# Patient Record
Sex: Female | Born: 1950 | Race: White | Hispanic: No | Marital: Married | Smoking: Never smoker
Health system: Southern US, Community
[De-identification: ages and names within clinical notes are randomized; demographics above are authoritative.]

## PROBLEM LIST (undated history)

## (undated) DIAGNOSIS — I8390 Asymptomatic varicose veins of unspecified lower extremity: Secondary | ICD-10-CM

## (undated) DIAGNOSIS — Q059 Spina bifida, unspecified: Secondary | ICD-10-CM

## (undated) DIAGNOSIS — I639 Cerebral infarction, unspecified: Secondary | ICD-10-CM

## (undated) DIAGNOSIS — E063 Autoimmune thyroiditis: Secondary | ICD-10-CM

## (undated) DIAGNOSIS — K219 Gastro-esophageal reflux disease without esophagitis: Secondary | ICD-10-CM

## (undated) DIAGNOSIS — F419 Anxiety disorder, unspecified: Secondary | ICD-10-CM

## (undated) DIAGNOSIS — M47819 Spondylosis without myelopathy or radiculopathy, site unspecified: Secondary | ICD-10-CM

## (undated) DIAGNOSIS — C50919 Malignant neoplasm of unspecified site of unspecified female breast: Secondary | ICD-10-CM

## (undated) DIAGNOSIS — R51 Headache: Secondary | ICD-10-CM

## (undated) DIAGNOSIS — E7800 Pure hypercholesterolemia, unspecified: Secondary | ICD-10-CM

## (undated) HISTORY — PX: CYSTOSCOPY WITH URETHRAL CARUNCLE: SHX5124

## (undated) HISTORY — DX: Malignant neoplasm of unspecified site of unspecified female breast: C50.919

## (undated) HISTORY — DX: Asymptomatic varicose veins of unspecified lower extremity: I83.90

## (undated) HISTORY — DX: Autoimmune thyroiditis: E06.3

## (undated) HISTORY — PX: TUBAL LIGATION: SHX77

## (undated) HISTORY — DX: Anxiety disorder, unspecified: F41.9

## (undated) HISTORY — DX: Spondylosis without myelopathy or radiculopathy, site unspecified: M47.819

## (undated) HISTORY — DX: Spina bifida, unspecified: Q05.9

## (undated) HISTORY — DX: Cerebral infarction, unspecified: I63.9

## (undated) HISTORY — DX: Pure hypercholesterolemia, unspecified: E78.00

---

## 1974-11-24 HISTORY — PX: HERNIA REPAIR: SHX51

## 1988-11-24 HISTORY — PX: WISDOM TOOTH EXTRACTION: SHX21

## 1998-04-02 ENCOUNTER — Other Ambulatory Visit: Admission: RE | Admit: 1998-04-02 | Discharge: 1998-04-02 | Payer: Self-pay | Admitting: Obstetrics and Gynecology

## 2000-03-16 ENCOUNTER — Other Ambulatory Visit: Admission: RE | Admit: 2000-03-16 | Discharge: 2000-03-16 | Payer: Self-pay | Admitting: Obstetrics and Gynecology

## 2001-02-18 ENCOUNTER — Encounter: Payer: Self-pay | Admitting: Internal Medicine

## 2001-02-18 ENCOUNTER — Encounter: Admission: RE | Admit: 2001-02-18 | Discharge: 2001-02-18 | Payer: Self-pay | Admitting: Internal Medicine

## 2001-03-18 ENCOUNTER — Other Ambulatory Visit: Admission: RE | Admit: 2001-03-18 | Discharge: 2001-03-18 | Payer: Self-pay | Admitting: Obstetrics and Gynecology

## 2002-02-25 ENCOUNTER — Encounter (INDEPENDENT_AMBULATORY_CARE_PROVIDER_SITE_OTHER): Payer: Self-pay | Admitting: Specialist

## 2002-02-25 ENCOUNTER — Ambulatory Visit (HOSPITAL_COMMUNITY): Admission: RE | Admit: 2002-02-25 | Discharge: 2002-02-25 | Payer: Self-pay | Admitting: Gastroenterology

## 2002-03-03 ENCOUNTER — Other Ambulatory Visit: Admission: RE | Admit: 2002-03-03 | Discharge: 2002-03-03 | Payer: Self-pay | Admitting: Obstetrics and Gynecology

## 2003-03-13 ENCOUNTER — Other Ambulatory Visit: Admission: RE | Admit: 2003-03-13 | Discharge: 2003-03-13 | Payer: Self-pay | Admitting: Obstetrics and Gynecology

## 2004-03-12 ENCOUNTER — Other Ambulatory Visit: Admission: RE | Admit: 2004-03-12 | Discharge: 2004-03-12 | Payer: Self-pay | Admitting: Obstetrics and Gynecology

## 2005-03-17 ENCOUNTER — Other Ambulatory Visit: Admission: RE | Admit: 2005-03-17 | Discharge: 2005-03-17 | Payer: Self-pay | Admitting: *Deleted

## 2006-03-18 ENCOUNTER — Other Ambulatory Visit: Admission: RE | Admit: 2006-03-18 | Discharge: 2006-03-18 | Payer: Self-pay | Admitting: Obstetrics & Gynecology

## 2007-07-02 ENCOUNTER — Other Ambulatory Visit: Admission: RE | Admit: 2007-07-02 | Discharge: 2007-07-02 | Payer: Self-pay | Admitting: Obstetrics & Gynecology

## 2007-07-25 ENCOUNTER — Encounter: Admission: RE | Admit: 2007-07-25 | Discharge: 2007-07-25 | Payer: Self-pay | Admitting: Obstetrics & Gynecology

## 2008-09-21 ENCOUNTER — Other Ambulatory Visit: Admission: RE | Admit: 2008-09-21 | Discharge: 2008-09-21 | Payer: Self-pay | Admitting: Obstetrics & Gynecology

## 2008-10-13 ENCOUNTER — Encounter: Admission: RE | Admit: 2008-10-13 | Discharge: 2008-10-13 | Payer: Self-pay | Admitting: Obstetrics & Gynecology

## 2009-08-05 ENCOUNTER — Encounter: Admission: RE | Admit: 2009-08-05 | Discharge: 2009-08-05 | Payer: Self-pay | Admitting: Family Medicine

## 2009-08-30 ENCOUNTER — Encounter: Admission: RE | Admit: 2009-08-30 | Discharge: 2009-08-30 | Payer: Self-pay | Admitting: Family Medicine

## 2009-09-09 ENCOUNTER — Emergency Department (HOSPITAL_COMMUNITY): Admission: EM | Admit: 2009-09-09 | Discharge: 2009-09-09 | Payer: Self-pay | Admitting: Family Medicine

## 2010-12-15 ENCOUNTER — Encounter: Payer: Self-pay | Admitting: Family Medicine

## 2011-04-11 NOTE — Procedures (Signed)
[facility]  Patient:    Barbara [name], Barbara [name] Visit Number: [id number] MRN: [id number]          Service Type: END Location: ENDO Attending Physician:  Nelda Marseille Dictated by:   Petra Kuba, M.D. Proc. Date: 02/25/02 Admit Date:  02/25/2002   CC:         Julieanne Manson, M.D.   Procedure Report  PROCEDURE:  Colonoscopy with polypectomy.  INDICATION:  Patient with polyp on flexible sigmoidoscopy.  Consent was signed after risks, benefits, methods, and options thoroughly discussed in the office.  MEDICATIONS:  Demerol 50, Versed 5.  DESCRIPTION OF PROCEDURE:  Rectal inspection was pertinent for external hemorrhoids.  The pediatric video adjustable colonoscope was inserted and easily advanced around the colon to the cecum.  This did require rolling her on her back and some abdominal pressure.  On insertion, in the mid sigmoid, the polyp seen on flexible sigmoidoscopy was seen.  Photodocumentation was obtained.  Based on its size and location, we thought we could easily find it on withdrawal.  On insertion, however, in the proximal level of the splenic flexure, a small polyp was seen and was hot biopsied x 1 and put in the first container.  No other abnormalities were seen as we advanced to the cecum which was identified by the appendiceal orifice and the ileocecal valve.  In fact, the scope was inserted a short ways in the terminal ileum which was normal. Photodocumentation was obtained.  The scope was slowly withdrawn.  The prep was adequate.  There was some liquid stool that required washing and suctioning but, on slow withdrawal through the colon, the cecum and ascending were normal.  In the mid transverse, another tiny polyp was seen and was hot biopsied x 1 and put in the first container.  The scope was withdrawn back to the other polypectomy site.  No obvious polypoid tissue was remaining, and there was a nice white coagulum.   The scope was further withdrawn to the mid sigmoid, where the polyp seen on insertion was seen, snared, electrocautery applied, and the polyp was suctioned through the scope and collected in the trap.  Possibly along the cautery line was some residual polyp which was easily cold biopsy x 2 and put in the second container with the polyp that was just removed.  The scope was further withdrawn.  No additional findings were seen as we withdrew back to the rectum.  Once back in the rectum, the scope was retroflexed, pertinent for some internal hemorrhoids.  The scope was straightened and readvanced a short ways around the left side of the colon to the polypectomy site.  No obvious polypoid tissue was remaining; air was suctioned and the scope removed.  The patient tolerated the procedure well. There was no obvious immediate complications.  ENDOSCOPIC DIAGNOSES: 1. Internal and external hemorrhoids. 2. A 5 mm sigmoid polyp, status post snare and cold biopsied of the edge. 3. Two tiny descending and transverse polyps, hot biopsied. 4. Otherwise within normal limits to the terminal ileum.  PLAN: 1. Await pathology, but probably recheck colon screening in 3-5 years. 2. Happy to see back p.r.n., otherwise return care to Dr. Delrae Alfred for the    customary health care maintenance to include yearly rectals and guaiacs. 3. Will omit aspirin and nonsteroidals for one week. Dictated by:   Petra Kuba, M.D. Attending Physician:  Nelda Marseille DD:  02/25/02 TD:  02/26/02 Job: [id number]

## 2013-04-11 ENCOUNTER — Other Ambulatory Visit: Payer: Self-pay | Admitting: *Deleted

## 2013-04-11 NOTE — Telephone Encounter (Signed)
Faxed refill request received from pharmacy for VITAMIN D  Last filled by MD on 02/06/12, #26 X 3 Last AEX - 01/10/13 Last Vitamin D level - 09/26/09 = 44 Next AEX - 03/23/14 Please advise refills.  Paper chart on your desk.

## 2013-04-14 ENCOUNTER — Telehealth: Payer: Self-pay | Admitting: *Deleted

## 2013-04-14 NOTE — Telephone Encounter (Signed)
Call taken care of in 04/14/13 telephone note.

## 2013-04-14 NOTE — Telephone Encounter (Signed)
Pt. Had Vit D drawn with Dr. Maurice Small 04/08/13. Pt didn't remember the result will have them fax over result, told pt we would call her back with further instructions. Also notified her that Vit D Protocol has changed.

## 2013-04-14 NOTE — Telephone Encounter (Signed)
Left Message To Call Back Re: Per Dr. Farrel Gobble pt needs to f/u with Dr. Jone Baseman office since they drew her Vit D.

## 2013-04-14 NOTE — Telephone Encounter (Signed)
S/w pt Per Dr. Farrel Gobble pt needs to have her PCP f/u with her Vit D rx. Notified pt of this and she is aware.

## 2013-04-14 NOTE — Telephone Encounter (Signed)
Left Message To Call Back Re: Calling in regards to Vit D 50,000 iu request from [facility] pharmacy; Dr. Farrel Gobble wants to know if pt has gotten VIT D drawn somewhere else, last drawn here 12/21/08 it was 38. If not pt needs to come in and have Vit D drawn per Dr. Farrel Gobble.

## 2013-08-17 ENCOUNTER — Telehealth: Payer: Self-pay | Admitting: Obstetrics & Gynecology

## 2013-08-17 DIAGNOSIS — Z803 Family history of malignant neoplasm of breast: Secondary | ICD-10-CM

## 2013-08-17 NOTE — Telephone Encounter (Signed)
MRI of breasts recommended by Dr. Hyacinth Meeker per patient. Patient wants to know if she needs an order please?

## 2013-08-17 NOTE — Telephone Encounter (Signed)
Spoke with patient. Last breast MRI 2012, was wondering if she should continue with MRI's and at what frequency she should be getting MRI's. Advised patient I would send Dr. Hyacinth Meeker message to confirm.   Patient with strong family hx of Breast CA. Last annual 01/10/13 with Dr. Farrel Gobble.  Last Mammogram 07/28/13 at [place]. Chart to Dr. Rondel Baton office.

## 2013-08-24 NOTE — Telephone Encounter (Signed)
Breast MRI ordered bilateral with and without contrast.   Calling patient to advise order was placed and [facility] should contact patient to schedule appointment.  Message left to return call to [place] at [phone].

## 2013-08-24 NOTE — Telephone Encounter (Signed)
She has been doing every other year MRIs.  Usually we schedule and then pre-cert with insurance.  Can you start that process please?

## 2013-08-25 NOTE — Telephone Encounter (Signed)
Patient calling back. Advised order was placed and they will call to schedule. Advised to let us know if scheduler does not call her. Patient agreeable.

## 2013-08-29 NOTE — Telephone Encounter (Signed)
LMTCB for sceduler at [facility] [place] at [phone] to assist with scheduling.

## 2013-08-29 NOTE — Telephone Encounter (Signed)
Spoke with scheduler, they will call patient at home to schedule MRI.

## 2013-08-31 ENCOUNTER — Telehealth: Payer: Self-pay | Admitting: Obstetrics & Gynecology

## 2013-08-31 NOTE — Telephone Encounter (Signed)
LMTCB need ins info for prior auth for MRI.

## 2013-09-01 NOTE — Telephone Encounter (Signed)
Barbara [name], Barbara [name] MRN: [id number]  Date: 09/08/2013 Status: Sch  Time: 1:00 PM  Length: 45   Visit Type: MR BREAST W/O & W/CM [150610]  Copay: $0.00  Provider: [facility] Mr 1 Department: [facility] MRI  Referring Provider: Annamaria Boots CSN: [id number]  Notes: PREV MAMMO 07-28-13 AT [facility]-REQUESTED 150 LBS/LABS AT 315 SITE INS [employer]   Made On: Change Notes:  08/29/2013 3:19 PM 08/29/2013 3:20 PM  By: By:  Darcey Nora, KRISTY    Appointment Instructions  Visit Type: MR BREAST W/O & W/CM   Patient Instructions: MR BREAST W/O & W/CM - Patient to arrive 15 minutes prior to appointment time.       Appointment Orders  Order # Category Procedure Appointment Association  [id number]  Img Quincy Simmonds  MR BREAST BILATERAL W WO CONTRAST [[id number]]  Linked         Patient has appointment scheduled for MRI.

## 2013-09-08 ENCOUNTER — Ambulatory Visit
Admission: RE | Admit: 2013-09-08 | Discharge: 2013-09-08 | Disposition: A | Discharge status: Home / Self Care | Payer: BC Managed Care – PPO | Source: Ambulatory Visit | Attending: Obstetrics & Gynecology | Admitting: Obstetrics & Gynecology

## 2013-09-08 DIAGNOSIS — Z803 Family history of malignant neoplasm of breast: Secondary | ICD-10-CM

## 2013-09-08 MED ORDER — GADOBENATE DIMEGLUMINE 529 MG/ML IV SOLN
14.0000 mL | Freq: Once | INTRAVENOUS | Status: AC | PRN
  Administered 2013-09-08: 14 mL via INTRAVENOUS

## 2013-09-21 ENCOUNTER — Other Ambulatory Visit: Payer: Self-pay | Admitting: Radiology

## 2013-09-26 ENCOUNTER — Telehealth: Payer: Self-pay | Admitting: *Deleted

## 2013-09-26 DIAGNOSIS — C50411 Malignant neoplasm of upper-outer quadrant of right female breast: Secondary | ICD-10-CM

## 2013-09-26 NOTE — Telephone Encounter (Signed)
Confirmed [facility] for 09/28/13 at 0830.  Instructions and contact information given.

## 2013-09-27 ENCOUNTER — Telehealth: Payer: Self-pay | Admitting: Obstetrics & Gynecology

## 2013-09-27 NOTE — Telephone Encounter (Signed)
Received pathology report about invasive breast cancer.  Pt has appt 09/28/13.  Offered support.  Questions answered.  Pt very appreciative.

## 2013-09-28 ENCOUNTER — Ambulatory Visit (HOSPITAL_BASED_OUTPATIENT_CLINIC_OR_DEPARTMENT_OTHER): Payer: BC Managed Care – PPO | Admitting: Oncology

## 2013-09-28 ENCOUNTER — Other Ambulatory Visit (HOSPITAL_BASED_OUTPATIENT_CLINIC_OR_DEPARTMENT_OTHER): Payer: BC Managed Care – PPO | Admitting: Lab

## 2013-09-28 ENCOUNTER — Ambulatory Visit: Payer: BC Managed Care – PPO

## 2013-09-28 ENCOUNTER — Ambulatory Visit
Admission: RE | Admit: 2013-09-28 | Discharge: 2013-09-28 | Disposition: A | Discharge status: Home / Self Care | Payer: BC Managed Care – PPO | Source: Ambulatory Visit | Attending: Radiation Oncology | Admitting: Radiation Oncology

## 2013-09-28 ENCOUNTER — Encounter: Payer: Self-pay | Admitting: *Deleted

## 2013-09-28 ENCOUNTER — Ambulatory Visit (HOSPITAL_BASED_OUTPATIENT_CLINIC_OR_DEPARTMENT_OTHER): Payer: BC Managed Care – PPO | Admitting: General Surgery

## 2013-09-28 ENCOUNTER — Encounter: Payer: Self-pay | Admitting: Oncology

## 2013-09-28 ENCOUNTER — Ambulatory Visit: Payer: BC Managed Care – PPO | Attending: General Surgery | Admitting: Physical Therapy

## 2013-09-28 ENCOUNTER — Encounter (INDEPENDENT_AMBULATORY_CARE_PROVIDER_SITE_OTHER): Payer: Self-pay | Admitting: General Surgery

## 2013-09-28 VITALS — BP 142/75 | HR 81 | Temp 98.4°F | Resp 18 | Ht 66.5 in | Wt 153.3 lb

## 2013-09-28 DIAGNOSIS — C50411 Malignant neoplasm of upper-outer quadrant of right female breast: Secondary | ICD-10-CM

## 2013-09-28 DIAGNOSIS — C50919 Malignant neoplasm of unspecified site of unspecified female breast: Secondary | ICD-10-CM | POA: Insufficient documentation

## 2013-09-28 DIAGNOSIS — C50419 Malignant neoplasm of upper-outer quadrant of unspecified female breast: Secondary | ICD-10-CM

## 2013-09-28 DIAGNOSIS — IMO0001 Reserved for inherently not codable concepts without codable children: Secondary | ICD-10-CM | POA: Insufficient documentation

## 2013-09-28 DIAGNOSIS — Z170 Estrogen receptor positive status [ER+]: Secondary | ICD-10-CM

## 2013-09-28 LAB — COMPREHENSIVE METABOLIC PANEL (CC13)
AST: 16 U/L (ref 5–34)
Anion Gap: 9 mEq/L (ref 3–11)
BUN: 23.2 mg/dL (ref 7.0–26.0)
CO2: 25 mEq/L (ref 22–29)
Creatinine: 0.9 mg/dL (ref 0.6–1.1)
Glucose: 85 mg/dl (ref 70–140)
Total Bilirubin: 0.45 mg/dL (ref 0.20–1.20)
Total Protein: 7.4 g/dL (ref 6.4–8.3)

## 2013-09-28 LAB — CBC WITH DIFFERENTIAL/PLATELET
BASO%: 1.1 % (ref 0.0–2.0)
Basophils Absolute: 0.1 10*3/uL (ref 0.0–0.1)
EOS%: 3.4 % (ref 0.0–7.0)
Eosinophils Absolute: 0.2 10*3/uL (ref 0.0–0.5)
HCT: 40.2 % (ref 34.8–46.6)
HGB: 13.6 g/dL (ref 11.6–15.9)
LYMPH%: 33.8 % (ref 14.0–49.7)
MCH: 30.5 pg (ref 25.1–34.0)
MCHC: 33.7 g/dL (ref 31.5–36.0)
MCV: 90.3 fL (ref 79.5–101.0)
MONO#: 0.4 10*3/uL (ref 0.1–0.9)
MONO%: 7.2 % (ref 0.0–14.0)
NEUT#: 3.2 10*3/uL (ref 1.5–6.5)
NEUT%: 54.5 % (ref 38.4–76.8)
Platelets: 245 10*3/uL (ref 145–400)
RBC: 4.45 10*6/uL (ref 3.70–5.45)
RDW: 12.9 % (ref 11.2–14.5)
WBC: 5.9 10*3/uL (ref 3.9–10.3)
lymph#: 2 10*3/uL (ref 0.9–3.3)

## 2013-09-28 NOTE — Progress Notes (Signed)
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 female who is seen today at the [facility] for the courtesy Dr. Carolynne Edouard for consideration of radiation therapy in the management of her T1 N0 invasive ductal/DCIS of the right breast. Because of her strong family history, she had a breast MR on 09/08/2013 which showed a new suspicious mass in the deep central upper outer portion of the right breast. There was no adenopathy. Breast ultrasound at [facility] on October 29 showed a 1.1 cm nodule within the right breast at 10:00. Ultrasound guided biopsy on 09/21/2013 was diagnostic for grade II invasive ductal/DCIS of the right breast. This was ER and PR positive and HER-2/neu positive with a proliferation index of 25%. She is considering bilateral mastectomies. She is seen today with Dr. Carolynne Edouard and Dr. Darnelle Catalan.  PREVIOUS RADIATION THERAPY: No   PAST MEDICAL HISTORY:  has a past medical history of Breast cancer; Hashimoto's thyroiditis; Hypercholesteremia; Spondylarthritis; Spina bifida; and Anxiety.     PAST SURGICAL HISTORY:  Past Surgical History  Procedure Laterality Date  . Hernia repair    . Tubal ligation    . Cystoscopy with urethral caruncle       FAMILY HISTORY: family history includes Breast cancer in her maternal aunt, mother, sister, sister, and sister; Lung cancer in her father; Prostate cancer in her brother. Her father died from a heart attack at age 57, he also had a history of lung cancer and COPD. Her mother died from hemorrhage while on Coumadin for a prosthetic mitral valve at age 88. Her mother had breast cancer 39 and  she had 2 sisters with breast cancer at ages 18 and 69.   SOCIAL HISTORY:  reports that she has never smoked. She does not have any smokeless tobacco history on file. She reports that she drinks alcohol. She reports that she does not use illicit drugs. Married, 2 children. She works as a Orthoptist.   ALLERGIES: Buprenex; Simvastatin; and Codeine   MEDICATIONS:  Current Outpatient Prescriptions  Medication Sig Dispense Refill  . aspirin 81 MG tablet Take 81 mg by mouth daily.      . Aspirin-Caffeine (BAYER BACK & BODY PAIN EX ST) 500-32.5 MG TABS Take 1 tablet by mouth as needed. 1 tab only PRN for headache or back ache      . atorvastatin (LIPITOR) 10 MG tablet Take 10 mg by mouth daily.      . Calcium-Vitamin D-Vitamin K (VIACTIV) 500-500-40 MG-UNT-MCG CHEW Chew 2 tablets by mouth.      . cetirizine (ZYRTEC) 10 MG tablet Take 10 mg by mouth daily as needed for allergies. PRN allergy season      . Cholecalciferol (D3-1000) 1000 UNITS tablet Take 1,000 Units by mouth daily.      . Coenzyme Q10 (CO Q-10) 100 MG CAPS Take 2 tablets by mouth daily.      Marland Kitchen levothyroxine (SYNTHROID, LEVOTHROID) 50 MCG tablet Take 50 mcg by mouth daily before breakfast.      . mometasone (NASONEX) 50 MCG/ACT nasal spray  Place 2 sprays into the nose as needed.      . Nutritional Supplements (JUICE PLUS FIBRE PO) Take 1 each by mouth daily.      Marland Kitchen PARoxetine (PAXIL) 20 MG tablet Take 20 mg by mouth daily.       No current facility-administered medications for this encounter.     REVIEW OF SYSTEMS:  Pertinent items are noted in HPI.    PHYSICAL EXAM: Alert and oriented 62 year old white female appearing her stated age. Wt Readings from Last 3 Encounters:  09/28/13 153 lb 4.8 oz (69.536 kg)   Temp Readings from Last 3 Encounters:  09/28/13 98.4 F (36.9 C) Oral   BP Readings from Last 3 Encounters:  09/28/13 142/75   Pulse Readings from Last 3 Encounters:  09/28/13 81   Head and neck  examination: Grossly unremarkable. Nodes: Without palpable cervical, supraclavicular, or axillary lymphadenopathy. Chest: Lungs clear. Heart: Regular rate and rhythm. Back: Without spinal or CVA tenderness. Breasts: There is a palpable 2 cm mass at approximately 10:00 along the lateral breast which may represent a small hematoma, breast primary, or both. There is moderate ecchymosis. Left breast without masses or lesions. Abdomen without hepatomegaly. Extremities: Without edema.    LABORATORY DATA:  Lab Results  Component Value Date   WBC 5.9 09/28/2013   HGB 13.6 09/28/2013   HCT 40.2 09/28/2013   MCV 90.3 09/28/2013   PLT 245 09/28/2013   Lab Results  Component Value Date   NA 139 09/28/2013   K 4.2 09/28/2013   CO2 25 09/28/2013   Lab Results  Component Value Date   ALT 15 09/28/2013   AST 16 09/28/2013   ALKPHOS 78 09/28/2013   BILITOT 0.45 09/28/2013      IMPRESSION: Clinical stage I (T1, N0, M0) invasive ductal/DCIS of the right breast. I explained to the patient and her family that her local treatment options include a partial mastectomy followed by radiation therapy, or mastectomy alone along with a sentinel lymph node biopsy. Breast preservation is still an option even though she may have a genetic predisposition, particularly at a postmenopausal age. We discussed the potential acute and late toxicities of radiation therapy. She would be considered for hypofractionated radiation therapy. At this point she would like to consider mastectomy and also a contralateral prophylactic mastectomy. She'll also undergo further genetic testing tomorrow. She will be offered systemic therapy by Dr. Darnelle Catalan, she would like to wait on her Port-A-Cath until her surgical staging is completed.   PLAN: As discussed above. Even though she seems to have made up her mind and go forward with mastectomies, she will think things over.  I spent 30 minutes minutes face to face with the patient and more than 50% of  that time was spent in counseling and/or coordination of care.

## 2013-09-28 NOTE — Progress Notes (Signed)
Checked in new patient with no financial issues. She has her [facility] packet and appt card. She has not traveled to [place].

## 2013-09-28 NOTE — Progress Notes (Signed)
ID: Jarrett Ables OB: 10-19-1951  MR#: [id number]  WUJ#:[id number]  PCP: Astrid Divine, MD GYN:  Lum Keas SU: [name] OTHER NF:AOZHYQ [place] Scrape  CHIEF COMPLAINT: "I have been found to have breast cancer".  HISTORY OF PRESENT ILLNESS: The patient has a very strong family history for breast cancer, although she has teste  negative for the BRCA mutations (as have 2 of her sisters). She had been receiving breast MRI in addition to mammography, but had not had this test performed since October of 2010. More recently, 07/22/2013, routine screening mammography with tomography at [facility] showed no worrisome findings in the setting of extremely dense breasts.  MRI of the breast 09/08/2013, however, showed in the right upper outer quadrant an irregular mass measuring 1.7 cm. This was new as compared to the prior MRI from 2010. There were no abnormal lymph nodes and no other areas of concern in either breast. 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 The MIB-1 was 25%.  The patient's subsequent history is as detailed below  .INTERVAL HISTORY:  Sherea was evaluated in the multidisciplinary breast cancer clinic 09/28/2013 accompanied by her husband and, her daughter Wynona Canes, her son Loistine Chance, and her daughter-in-law December.  REVIEW OF SYSTEMS:  There were no unusual symptoms leading to the mammography, which was routinely scheduled. She does have a history of arthritis particularly affecting both thumbs. She bruises easily. She describes herself is anxious, but not depressed. She has rare headaches, which she describes as "like migraines". She has mild stress urinary incontinence. A detailed review of systems today was otherwise noncontributory  PAST MEDICAL HISTORY: Past Medical  History  Diagnosis Date  . Breast cancer   . Hashimoto's thyroiditis     with goiter  . Hypercholesteremia   . 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  Narrowing of the 4th and 5th lumbar interspaces.   Marland Kitchen Spina bifida     occutta L5  . Anxiety     PAST SURGICAL HISTORY: Past Surgical History  Procedure Laterality Date  . Hernia repair    . Tubal ligation    . Cystoscopy with urethral caruncle      FAMILY HISTORY Family History  Problem Relation Age of Onset  . Breast cancer Mother   . Lung cancer Father   . Breast cancer Sister   . Prostate cancer Brother   . Breast cancer Maternal Aunt   . Breast cancer Sister   . Breast cancer Sister   The patient's mother was diagnosed with breast cancer the age of 35. She died at the age of 63. The patient's father died at the age of 25, with a history of lung cancer. The patient had 2 brothers, and 2 sisters. The patient's sister Aurea Graff has a history of bilateral breast cancer in her sister Clerance Lav a history of left breast cancer. Both have been tested and are found to be negative for a BRCA one or 2 mutation. The patient's mother's sister also had a history of breast cancer. The patient herself has been tested for the BRCA mutation as well as through [facility]. No mutation has been file.   GYNECOLOGIC HISTORY:  Menarche age 48, first live birth age 41. The patient is GX P2. She stopped having periods in  her early 59s. She did not take hormone replacement.  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 Her husband Ed used to work for [employer] an old. Daughter Christinia Gully works as a Firefighter in [place]. Son Loistine Chance is a drug representative for [employer] (diabetes). His wife, and December, is a Geneticist, molecular in various [facility] emergency room's. The patient has 5 grandchildren. She at tends [facility]. [facility]    ADVANCED  DIRECTIVES:  In place   HEALTH MAINTENANCE: History  Substance Use Topics  . Smoking status: Never Smoker   . Smokeless tobacco: Not on file  . Alcohol Use: Yes     Colonoscopy: 2013/ [facility]  PAP: 2013  Bone density: 2013/osteopenia  Lipid panel:  Allergies  Allergen Reactions  . Buprenex [Buprenorphine] Nausea And Vomiting and Other (See Comments)    Oversedation  . Simvastatin Other (See Comments)    Myalgias   . Codeine Hives, Itching and Rash    Current Outpatient Prescriptions  Medication Sig Dispense Refill  . aspirin 81 MG tablet Take 81 mg by mouth daily.      . Aspirin-Caffeine (BAYER BACK & BODY PAIN EX ST) 500-32.5 MG TABS Take 1 tablet by mouth as needed. 1 tab only PRN for headache or back ache      . atorvastatin (LIPITOR) 10 MG tablet Take 10 mg by mouth daily.      . Calcium-Vitamin D-Vitamin K (VIACTIV) 500-500-40 MG-UNT-MCG CHEW Chew 2 tablets by mouth.      . cetirizine (ZYRTEC) 10 MG tablet Take 10 mg by mouth daily as needed for allergies. PRN allergy season      . Cholecalciferol (D3-1000) 1000 UNITS tablet Take 1,000 Units by mouth daily.      . Coenzyme Q10 (CO Q-10) 100 MG CAPS Take 2 tablets by mouth daily.      Marland Kitchen levothyroxine (SYNTHROID, LEVOTHROID) 50 MCG tablet Take 50 mcg by mouth daily before breakfast.      . mometasone (NASONEX) 50 MCG/ACT nasal spray Place 2 sprays into the nose as needed.      . Nutritional Supplements (JUICE PLUS FIBRE PO) Take 1 each by mouth daily.      Marland Kitchen PARoxetine (PAXIL) 20 MG tablet Take 20 mg by mouth daily.       No current facility-administered medications for this visit.    OBJECTIVE:  Middle-aged white woman in no acute distress Filed Vitals:   09/28/13 0910  BP: 142/75  Pulse: 81  Temp: 98.4 F (36.9 C)  Resp: 18     Body mass index is 24.38 kg/(m^2).    ECOG FS:0 - Asymptomatic  Ocular: Sclerae unicteric, pupils equal, round and reactive to light Ear-nose-throat: Oropharynx clear, dentition  fair Lymphatic: No cervical or supraclavicular adenopathy Lungs no rales or rhonchi, good excursion bilaterally Heart regular rate and rhythm, no murmur appreciated Abd soft, nontender, positive bowel sounds MSK no focal spinal tenderness, no joint edema Neuro: non-focal, well-oriented, appropriate affect Breasts:  The right breast is status post recent biopsy. There is a small ecchymosis at the biopsy site. There are no skin or nipple changes of concern. The right axilla is benign. The left breast is unremarkable.   LAB RESULTS:  CMP     Component Value Date/Time   NA 139 09/28/2013 0841   K 4.2 09/28/2013 0841   CO2 25 09/28/2013 0841   GLUCOSE 85 09/28/2013 0841   BUN 23.2 09/28/2013 0841  CREATININE 0.9 09/28/2013 0841   CALCIUM 9.7 09/28/2013 0841   PROT 7.4 09/28/2013 0841   ALBUMIN 3.8 09/28/2013 0841   AST 16 09/28/2013 0841   ALT 15 09/28/2013 0841   ALKPHOS 78 09/28/2013 0841   BILITOT 0.45 09/28/2013 0841    I No results found for this basename: SPEP, UPEP,  kappa and lambda light chains    Lab Results  Component Value Date   WBC 5.9 09/28/2013   NEUTROABS 3.2 09/28/2013   HGB 13.6 09/28/2013   HCT 40.2 09/28/2013   MCV 90.3 09/28/2013   PLT 245 09/28/2013      Chemistry      Component Value Date/Time   NA 139 09/28/2013 0841   K 4.2 09/28/2013 0841   CO2 25 09/28/2013 0841   BUN 23.2 09/28/2013 0841   CREATININE 0.9 09/28/2013 0841      Component Value Date/Time   CALCIUM 9.7 09/28/2013 0841   ALKPHOS 78 09/28/2013 0841   AST 16 09/28/2013 0841   ALT 15 09/28/2013 0841   BILITOT 0.45 09/28/2013 0841       No results found for this basename: LABCA2    No components found with this basename: LABCA125    No results found for this basename: INR,  in the last 168 hours  Urinalysis No results found for this basename: colorurine, appearanceur, labspec, phurine, glucoseu, hgbur, bilirubinur, ketonesur, proteinur, urobilinogen, nitrite, leukocytesur    STUDIES: Mr  Breast Bilateral W Wo Contrast  09/08/2013   CLINICAL DATA:  Strong family history of breast cancer in her mother age 87 and 2 sisters at age 46 and age 46. No current problems  EXAM: MR BILATERAL BREAST WITHOUT AND WITH CONT RAST  LABS:  BUN and creatinine were obtained on site at [facility] at [address] Results: BUN 25 mg/dL, Creatinine 1.1 mg/dL, GFR 50  TECHNIQUE: Multiplanar, multisequence MR images of both breasts were obtained prior to and following the intravenous administration of 14ml of MultiHance.  THREE-DIMENSIONAL MR IMAGE RENDERING ON INDEPENDENT WORKSTATION:  Three-dimensional MR images were rendered by post-processing of the original MR data on an independent workstation. The three-dimensional MR images were interpreted, and findings are reported in the following complete MRI report for this study.  COMPARISON:  Previous exams  FINDINGS: Breast composition: c. Heterogeneous fibroglandular tissue  Background parenchymal enhancement: Mild  Right breast: In the deep posterior aspect of the right upper outer quadrant there is an irregular mass which demonstrates heterogeneous enhancement measuring 8 x 8 by 17 mm. An area of associated linear enhancement extends from the anterior inferior portion of this mass. This is new since the previous MRI of 2010 and would need to be viewed with suspicion. Several stable scattered small foci are identified. No other worrisome areas of focal enhancement are identified  Left breast: No new areas of abnormal enhancement or focal masses are seen. A small area of enhancement in the deep central aspect of the left breast and several scattered foci have been present since 2008 and would correlate with benign findings  Lymph nodes: No abnormal appearing lymph nodes.  Ancillary findings:  None.  IMPRESSION: New irregular enhancing mass in the deep central upper outer portion of the right breast is a suspicious finding. No signs of malignancy are  identified in the left breast.  RECOMMENDATION: Ultrasound evaluation of the right breast is recommended to evaluate for a sonographic correlate to the MRI finding. If no correlate is identified by ultrasound, then  MRI guided biopsy of the new right upper outer quadrant mass would be recommended  BI-RADS CATEGORY  0: Incomplete. Need additional imaging evaluation and/or prior mammograms for comparison.   Electronically Signed   By: Leda Gauze M.D.   On: 09/08/2013 15:45    ASSESSMENT: 62 y.o. BRCA negative [place] woman, status post right breast biopsy 09/21/2013 for a clinical T1c N0, stage IA invasive ductal carcinoma, grade 2, triple positive, with a HER-2: CED 17 ratio of 5.55 and an average copy number per cell of  13.05. The MIB-1-1 was 25%  (1) the patient participated in the Star study remotely, completing 5 years of tamoxifen in the late 1990s  PLAN: We spent the better part of today's hour-long appointment discussing the biology of breast cancer in general, and the specifics of the patient's tumor in particular. She understands that there is no survival difference between lumpectomy plus radiation and mastectomy. This was emphasized. She understands our recommendation is for lumpectomy. However Victoire is very clear in her mind that she wants bilateral mastectomies. Her reasoning is that she likely has an unknown mutation that will make it very likely to have breast cancer again in the future. Given her family history I think this is a reasonable assumption.  She understands that if she has mastectomy, and the lymph nodes were negative and the margins clear, she would not need radiation.  Being triple positive, she is a very good candidate for anti-estrogens. Anti-HER-2 treatment will also reduce her risk of recurrence by. Anti-HER-2 treatment is generally given with chemotherapy, more specifically with Taxol given days 1, 8 and 15 of every 28 day cycle, for 4 cycles (a total of 12 doses  over 4 months). This is generally well-tolerated, but the patient's first inclination is not to receive chemotherapy. This will mean that she will not receive HER-2 treatment either.  This is a complicated decision and it will help for [place] to have the definitive pathology in hand so we can work with specific numbers. Accordingly I have made a return appointment for Aby to see me in about 6-7 weeks, and before that visit she will come to chemotherapy school to learn more about Taxol Marlene Lard. We'll make a definitive decision regarding her systemic treatment at that visit.  The patient has a very good understanding of the overall plan. She is very much in agreement with that. She knows the goal of treatment is cure. She will call with any questions or problems that may develop before her next visit with me.   Lowella Dell, MD   09/28/2013 10:30 AM

## 2013-09-28 NOTE — Progress Notes (Signed)
Patient ID: Barbara [name], female   DOB: 11/23/1951, 62 y.o.   MRN: [id number]  No chief complaint on file.   HPI Barbara [name] is a 62 y.o. female.  We're asked to see the patient in consultation by Dr. Bertran to evaluate her for a right breast cancer. The patient is a 62-year-old white female who recently went for a routine screening mammogram. At that point her mammogram was negative. She has a very strong family history of breast cancer and because of this also got an MRI study. The MRI showed a small 1.7 cm cancer posteriorly in the breast near the chest wall. She denies any breast pain. She denies any discharge from the nipple. 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 She has been tested in his BRCA negative but she apparently has not had the full genetic panel. She also took 5 years of tamoxifen which she finished in 1999.  HPI  Past Medical History  Diagnosis Date  . Breast cancer   . Hashimoto's thyroiditis     with goiter  . Hypercholesteremia   . 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  Narrowing of the 4th and 5th lumbar interspaces.   . Spina bifida     occutta L5  . Anxiety     Past Surgical History  Procedure Laterality Date  . Hernia repair    . Tubal ligation    . Cystoscopy with urethral caruncle      Family History  Problem Relation Age of Onset  . Breast cancer Mother   . Lung cancer Father   . Breast cancer Sister   . Prostate cancer Brother   . Breast cancer Maternal Aunt   . Breast cancer Sister   . Breast cancer Sister     Social History History  Substance Use Topics  . Smoking status: Never Smoker   . Smokeless tobacco: Not on file  . Alcohol Use: Yes    Allergies  Allergen Reactions  . Buprenex [Buprenorphine] Nausea And Vomiting and Other (See Comments)    Oversedation  . Simvastatin Other  (See Comments)    Myalgias   . Codeine Hives, Itching and Rash    Current Outpatient Prescriptions  Medication Sig Dispense Refill  . aspirin 81 MG tablet Take 81 mg by mouth daily.      . Aspirin-Caffeine (BAYER BACK & BODY PAIN EX ST) 500-32.5 MG TABS Take 1 tablet by mouth as needed. 1 tab only PRN for headache or back ache      . atorvastatin (LIPITOR) 10 MG tablet Take 10 mg by mouth daily.      . Calcium-Vitamin D-Vitamin K (VIACTIV) 500-500-40 MG-UNT-MCG CHEW Chew 2 tablets by mouth.      . cetirizine (ZYRTEC) 10 MG tablet Take 10 mg by mouth daily as needed for allergies. PRN allergy season      . Cholecalciferol (D3-1000) 1000 UNITS tablet Take 1,000 Units by mouth daily.      . Coenzyme Q10 (CO Q-10) 100 MG CAPS Take 2 tablets by mouth daily.      . levothyroxine (SYNTHROID, LEVOTHROID) 50 MCG tablet Take 50 mcg by mouth daily before breakfast.      . mometasone (NASONEX) 50 MCG/ACT nasal spray Place 2 sprays into the nose as needed.      .   Nutritional Supplements (JUICE PLUS FIBRE PO) Take 1 each by mouth daily.      . PARoxetine (PAXIL) 20 MG tablet Take 20 mg by mouth daily.       No current facility-administered medications for this visit.    Review of Systems Review of Systems  Constitutional: Negative.   HENT: Negative.   Eyes: Negative.   Respiratory: Negative.   Cardiovascular: Negative.   Gastrointestinal: Negative.   Endocrine: Negative.   Genitourinary: Negative.   Musculoskeletal: Negative.   Skin: Negative.   Allergic/Immunologic: Negative.   Neurological: Negative.   Hematological: Negative.   Psychiatric/Behavioral: Negative.     There were no vitals taken for this visit.  Physical Exam Physical Exam  Constitutional: She is oriented to person, place, and time. She appears well-developed and well-nourished.  HENT:  Head: Normocephalic and atraumatic.  Eyes: Conjunctivae and EOM are normal. Pupils are equal, round, and reactive to light.  Neck:  Normal range of motion. Neck supple.  Cardiovascular: Normal rate, regular rhythm and normal heart sounds.   Pulmonary/Chest: Effort normal and breath sounds normal.  There is no palpable mass in either breast. There is no palpable axillary, supraclavicular, or cervical lymphadenopathy.  Abdominal: Soft. Bowel sounds are normal. She exhibits no mass. There is no tenderness.  Musculoskeletal: Normal range of motion.  Lymphadenopathy:    She has no cervical adenopathy.  Neurological: She is alert and oriented to person, place, and time.  Skin: Skin is warm and dry.  Psychiatric: She has a normal mood and affect. Her behavior is normal.    Data Reviewed As above  Assessment    The patient appears to have a small cancer in the right breast. I've talked to her in detail about the different options for surgical treatment. Given her strong family history she favors bilateral mastectomies with reconstruction. I think this is a reasonable option. I've discussed with her in detail the risks and benefits of the operation to this as well as some of the technical aspects and she understands and wishes to proceed. She will need to meet with the plastic surgeon so that we can coordinate our schedules     Plan    Plan for bilateral mastectomies with right sentinel node biopsy and reconstruction        TOTH III,PAUL S 09/28/2013, 12:23 PM    

## 2013-09-29 ENCOUNTER — Encounter: Payer: Self-pay | Admitting: Genetic Counselor

## 2013-09-29 ENCOUNTER — Other Ambulatory Visit: Payer: BC Managed Care – PPO | Admitting: Lab

## 2013-09-29 ENCOUNTER — Ambulatory Visit (HOSPITAL_BASED_OUTPATIENT_CLINIC_OR_DEPARTMENT_OTHER): Payer: BC Managed Care – PPO | Admitting: Genetic Counselor

## 2013-09-29 DIAGNOSIS — Z8042 Family history of malignant neoplasm of prostate: Secondary | ICD-10-CM

## 2013-09-29 DIAGNOSIS — Z803 Family history of malignant neoplasm of breast: Secondary | ICD-10-CM

## 2013-09-29 DIAGNOSIS — C50411 Malignant neoplasm of upper-outer quadrant of right female breast: Secondary | ICD-10-CM

## 2013-09-29 DIAGNOSIS — Z801 Family history of malignant neoplasm of trachea, bronchus and lung: Secondary | ICD-10-CM

## 2013-09-29 DIAGNOSIS — C50419 Malignant neoplasm of upper-outer quadrant of unspecified female breast: Secondary | ICD-10-CM

## 2013-09-29 DIAGNOSIS — Z808 Family history of malignant neoplasm of other organs or systems: Secondary | ICD-10-CM

## 2013-09-29 DIAGNOSIS — IMO0002 Reserved for concepts with insufficient information to code with codable children: Secondary | ICD-10-CM

## 2013-09-29 NOTE — Progress Notes (Signed)
Dr.  Raymond Gurney Magrinat requested a consultation for genetic counseling and risk assessment for [facility], a 62 y.o. female, for discussion of her personal history of breast cancer and family history of melanoma and breast, prostate and lung cancer.  She presents to clinic today to discuss the possibility of a genetic predisposition to cancer, and to further clarify her risks, as well as her family members' risks for cancer.   HISTORY OF PRESENT ILLNESS: In 2014, at the age of 87, Saleema Weppler was diagnosed with invasive ductal carcinoma of the right breast. This was treated with bilateral mastectomy.  The tumor is triple positive.  Ms. Wardle was treated with tamoxifen in the past for 5 years that ended in 1999.  She was testing for BRCA mutations and del/dup through [facility] and was negative for mutations.    Past Medical History  Diagnosis Date  . Breast cancer   . Hashimoto's thyroiditis     with goiter  . Hypercholesteremia   . 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  Narrowing of the 4th and 5th lumbar interspaces.   Marland Kitchen Spina bifida     occutta L5  . Anxiety     Past Surgical History  Procedure Laterality Date  . Hernia repair    . Tubal ligation    . Cystoscopy with urethral caruncle      History   Social History  . Marital Status: Married    Spouse Name: N/A    Number of Children: 2  . Years of Education: N/A   Occupational History  .     Social History Main Topics  . Smoking status: Never Smoker   . Smokeless tobacco: Not on file  . Alcohol Use: Yes  . Drug Use: No  . Sexual Activity: Not on file   Other Topics Concern  . Not on file   Social History Narrative  . No narrative on file    REPRODUCTIVE HISTORY AND PERSONAL RISK ASSESSMENT FACTORS: Menarche was at age 59.   postmenopausal Uterus Intact: yes Ovaries Intact: yes G3P2A1, first live birth at age 55  She has not previously  undergone treatment for infertility.   Oral Contraceptive use: 0 years   She has not used HRT in the past.    FAMILY HISTORY:  We obtained a detailed, 4-generation family history.  Significant diagnoses are listed below: Family History  Problem Relation Age of Onset  . Breast cancer Mother 28  . Lung cancer Father   . Breast cancer Sister 52    Bilateral breast cancer with 2nd dx at 69  . Breast cancer Maternal Aunt 52  . Breast cancer Sister 48  . Lung cancer Maternal Uncle   . Leukemia Paternal Uncle   . Melanoma Brother 52    on ear  . Prostate cancer Brother 34  . Cancer Cousin     maternal cousin with cancer - NOS   Patient's maternal ancestors are of [place] descent, and paternal ancestors are of [place] descent. There is no reported Ashkenazi Jewish ancestry. There is no known consanguinity.  GENETIC COUNSELING ASSESSMENT: Penelope Fittro is a 62 y.o. female with a personal history of breast cancer and family history of breast and prostate cancer and melanoma which somewhat suggestive of a hereditary cancer syndrome and predisposition to cancer. We, therefore, discussed and recommended the following at today's visit.   DISCUSSION: We reviewed the characteristics,  features and inheritance patterns of hereditary cancer syndromes. We also discussed genetic testing, including the appropriate family members to test, the process of testing, insurance coverage and turn-around-time for results. We reviewed that she meets medical criteria for genetic testing and discussed the gene panel that can be performed.  We will send to [facility] to obtain more testing  PLAN: After considering the risks, benefits, and limitations, Jarrett Ables provided informed consent to pursue genetic testing and the blood sample will be sent to [facility] for analysis of the multiple hereditary genes. We discussed the implications of a positive, negative and/ or variant of uncertain  significance genetic test result. Results should be available within approximately 2-3 weeks' time, at which point they will be disclosed by telephone to [facility], as will any additional recommendations warranted by these results. Jarrett Ables will receive a summary of her genetic counseling visit and a copy of her results once available. This information will also be available in Epic. We encouraged Kirke Corin Methot to remain in contact with cancer genetics annually so that we can continuously update the family history and inform her of any changes in cancer genetics and testing that may be of benefit for her family. Kirke Corin Causer's questions were answered to her satisfaction today. Our contact information was provided should additional questions or concerns arise.  The patient was seen for a total of 60 minutes, greater than 50% of which was spent face-to-face counseling.  This note will also be sent to the referring provider via the electronic medical record. The patient will be supplied with a summary of this genetic counseling discussion as well as educational information on the discussed hereditary cancer syndromes following the conclusion of their visit.   Patient was discussed with Dr. Drue Second.   _______________________________________________________________________ For Office Staff:  Number of people involved in session: 1 Was an Intern/ student involved with case: no

## 2013-09-30 ENCOUNTER — Telehealth: Payer: Self-pay | Admitting: Oncology

## 2013-09-30 NOTE — Telephone Encounter (Signed)
LVMM of appt w Dr Darnelle Catalan for 11/09/13 Cal mailed to pt shh

## 2013-10-04 NOTE — Telephone Encounter (Signed)
Barbara [name] would like to do surgery 11/25 / you would begin after am office 12:30 / need orders

## 2013-10-05 ENCOUNTER — Other Ambulatory Visit (INDEPENDENT_AMBULATORY_CARE_PROVIDER_SITE_OTHER): Payer: Self-pay | Admitting: General Surgery

## 2013-10-05 DIAGNOSIS — C50911 Malignant neoplasm of unspecified site of right female breast: Secondary | ICD-10-CM

## 2013-10-05 NOTE — Telephone Encounter (Signed)
Orders done and given to [facility]. Dr Carolynne Edouard cannot do 11/25

## 2013-10-07 ENCOUNTER — Telehealth: Payer: Self-pay | Admitting: *Deleted

## 2013-10-07 ENCOUNTER — Telehealth: Payer: Self-pay | Admitting: Oncology

## 2013-10-07 NOTE — Telephone Encounter (Signed)
Pt called stating that her surgery is scheduled for 11/09/13. She requested to come in before her procedure. gv appt for 11/01/13 w/labs@ 2:30pm and ov @ 3pm. i will mail a letter/avs..td

## 2013-10-07 NOTE — Telephone Encounter (Signed)
sw pt who stated surg moved from 12/17 to 11/20 so keep lab and  appt already on for 11/09/13 pt req let Dr. Darnelle Catalan know surg rs Email to Dr. Judie Petit of same shh

## 2013-10-10 ENCOUNTER — Encounter: Payer: Self-pay | Admitting: *Deleted

## 2013-10-10 ENCOUNTER — Encounter (HOSPITAL_COMMUNITY): Payer: Self-pay | Admitting: Pharmacy Technician

## 2013-10-10 ENCOUNTER — Telehealth: Payer: Self-pay | Admitting: *Deleted

## 2013-10-10 ENCOUNTER — Other Ambulatory Visit: Payer: Self-pay | Admitting: Plastic Surgery

## 2013-10-10 NOTE — Progress Notes (Signed)
Mailed after appt letter to pt. 

## 2013-10-10 NOTE — Telephone Encounter (Signed)
Spoke to pt concerning [facility] from 09/28/13.  Pt denies questions or concerns regarding dx or treatment care plan.  Confirmed surgery date and future appts.  Pt relayed she is going to have port placed at time of surgery.  Notified physician team.  Encourage pt to call with needs.  Received verbal understanding.  Contact information given.

## 2013-10-11 ENCOUNTER — Telehealth: Payer: Self-pay | Admitting: Oncology

## 2013-10-11 NOTE — Telephone Encounter (Signed)
added appt for 12/2 per 11/17 pof and also confirmed 12/9 appts.

## 2013-10-11 NOTE — Pre-Procedure Instructions (Signed)
LUVENA WENTLING  10/11/2013   Your procedure is scheduled on:  Thursday November 20 th at 0730 AM  Report to [facility] Main Entrance "A" at 0530 AM.  Call this number if you have problems the morning of surgery: [phone]   Remember:   Do not eat food or drink liquids after midnight.   Take these medicines the morning of surgery with A SIP OF WATER: Synthroid                  Stop Aspirin , Nsaids( Aleve , Motrin, Ibuprofen, Naproxen ), Herbal medication, and Vitamins 5 days prior to surgery.     Do not wear jewelry, make-up or nail polish.  Do not wear lotions, powders, or perfumes. You may wear deodorant.  Do not shave 48 hours prior to surgery. Men may shave face and neck.  Do not bring valuables to the hospital.  [facility] is not responsible for any belongings or valuables.               Contacts, dentures or bridgework may not be worn into surgery.  Leave suitcase in the car. After surgery it may be brought to your room.  For patients admitted to the hospital, discharge time is determined by your  treatment team.               Patients discharged the day of surgery will not be allowed to drive home.    Special Instructions: Shower using CHG 2 nights before surgery and the night before surgery.  If you shower the day of surgery use CHG.  Use special wash - you have one bottle of CHG for all showers.  You should use approximately 1/3 of the bottle for each shower.   Please read over the following fact sheets that you were given: Pain Booklet, Coughing and Deep Breathing and Surgical Site Infection Prevention

## 2013-10-12 ENCOUNTER — Encounter (HOSPITAL_COMMUNITY)
Admission: RE | Admit: 2013-10-12 | Discharge: 2013-10-12 | Disposition: A | Discharge status: Home / Self Care | Payer: BC Managed Care – PPO | Source: Ambulatory Visit | Attending: Anesthesiology | Admitting: Anesthesiology

## 2013-10-12 ENCOUNTER — Encounter (HOSPITAL_COMMUNITY)
Admission: RE | Admit: 2013-10-12 | Discharge: 2013-10-12 | Disposition: A | Discharge status: Home / Self Care | Payer: BC Managed Care – PPO | Source: Ambulatory Visit | Attending: General Surgery | Admitting: General Surgery

## 2013-10-12 ENCOUNTER — Encounter (HOSPITAL_COMMUNITY): Payer: Self-pay

## 2013-10-12 ENCOUNTER — Other Ambulatory Visit (HOSPITAL_COMMUNITY): Payer: BC Managed Care – PPO

## 2013-10-12 ENCOUNTER — Other Ambulatory Visit: Payer: Self-pay | Admitting: Oncology

## 2013-10-12 HISTORY — DX: Gastro-esophageal reflux disease without esophagitis: K21.9

## 2013-10-12 HISTORY — DX: Headache: R51

## 2013-10-12 LAB — CBC
HCT: 41.4 % (ref 36.0–46.0)
Hemoglobin: 14.4 g/dL (ref 12.0–15.0)
MCH: 31 pg (ref 26.0–34.0)
MCHC: 34.8 g/dL (ref 30.0–36.0)
MCV: 89 fL (ref 78.0–100.0)
Platelets: 255 K/uL (ref 150–400)
RBC: 4.65 MIL/uL (ref 3.87–5.11)
RDW: 12.8 % (ref 11.5–15.5)
WBC: 6.1 K/uL (ref 4.0–10.5)

## 2013-10-12 LAB — BASIC METABOLIC PANEL
BUN: 18 mg/dL (ref 6–23)
CO2: 27 mEq/L (ref 19–32)
Chloride: 102 mEq/L (ref 96–112)
GFR calc Af Amer: 76 mL/min — ABNORMAL LOW (ref >90)
GFR calc non Af Amer: 66 mL/min — ABNORMAL LOW (ref >90)
Glucose, Bld: 96 mg/dL (ref 70–99)
Potassium: 4.3 mEq/L (ref 3.5–5.1)
Sodium: 138 mEq/L (ref 135–145)

## 2013-10-12 MED ORDER — CEFAZOLIN SODIUM-DEXTROSE 2-3 GM-% IV SOLR
2.0000 g | INTRAVENOUS | Status: AC
  Administered 2013-10-13 (×2): 2 g via INTRAVENOUS
  Filled 2013-10-12: qty 50

## 2013-10-12 MED ORDER — CHLORHEXIDINE GLUCONATE 4 % EX LIQD: 1.0000 "application " | Freq: Once | CUTANEOUS | Status: DC

## 2013-10-12 NOTE — Progress Notes (Signed)
Spoke with Lupita Leash at Dr Maxcine Ham office about wording  on Consent , it should read possible placement of Flex HD instead of Flen HD

## 2013-10-13 ENCOUNTER — Encounter (HOSPITAL_COMMUNITY): Payer: Self-pay | Admitting: *Deleted

## 2013-10-13 ENCOUNTER — Inpatient Hospital Stay (HOSPITAL_COMMUNITY): Payer: BC Managed Care – PPO

## 2013-10-13 ENCOUNTER — Ambulatory Visit (HOSPITAL_COMMUNITY): Payer: BC Managed Care – PPO

## 2013-10-13 ENCOUNTER — Observation Stay (HOSPITAL_COMMUNITY)
Admission: RE | Admit: 2013-10-13 | Discharge: 2013-10-14 | Disposition: A | Discharge status: Home / Self Care | Payer: BC Managed Care – PPO | Source: Ambulatory Visit | Attending: General Surgery | Admitting: General Surgery

## 2013-10-13 ENCOUNTER — Ambulatory Visit (HOSPITAL_COMMUNITY): Payer: BC Managed Care – PPO | Admitting: Anesthesiology

## 2013-10-13 ENCOUNTER — Encounter (HOSPITAL_COMMUNITY)
Admission: RE | Disposition: A | Discharge status: Home / Self Care | Payer: Self-pay | Source: Ambulatory Visit | Attending: General Surgery

## 2013-10-13 ENCOUNTER — Encounter (HOSPITAL_COMMUNITY)
Admission: RE | Admit: 2013-10-13 | Discharge: 2013-10-13 | Disposition: A | Discharge status: Home / Self Care | Payer: BC Managed Care – PPO | Source: Ambulatory Visit | Attending: General Surgery | Admitting: General Surgery

## 2013-10-13 ENCOUNTER — Encounter (HOSPITAL_COMMUNITY): Payer: BC Managed Care – PPO | Admitting: Anesthesiology

## 2013-10-13 DIAGNOSIS — K219 Gastro-esophageal reflux disease without esophagitis: Secondary | ICD-10-CM | POA: Insufficient documentation

## 2013-10-13 DIAGNOSIS — C50411 Malignant neoplasm of upper-outer quadrant of right female breast: Secondary | ICD-10-CM

## 2013-10-13 DIAGNOSIS — E063 Autoimmune thyroiditis: Secondary | ICD-10-CM | POA: Insufficient documentation

## 2013-10-13 DIAGNOSIS — Z853 Personal history of malignant neoplasm of breast: Secondary | ICD-10-CM

## 2013-10-13 DIAGNOSIS — D0590 Unspecified type of carcinoma in situ of unspecified breast: Secondary | ICD-10-CM

## 2013-10-13 DIAGNOSIS — C50911 Malignant neoplasm of unspecified site of right female breast: Secondary | ICD-10-CM

## 2013-10-13 DIAGNOSIS — N6089 Other benign mammary dysplasias of unspecified breast: Secondary | ICD-10-CM

## 2013-10-13 DIAGNOSIS — Z01810 Encounter for preprocedural cardiovascular examination: Secondary | ICD-10-CM | POA: Insufficient documentation

## 2013-10-13 DIAGNOSIS — D249 Benign neoplasm of unspecified breast: Secondary | ICD-10-CM

## 2013-10-13 DIAGNOSIS — E049 Nontoxic goiter, unspecified: Secondary | ICD-10-CM | POA: Insufficient documentation

## 2013-10-13 DIAGNOSIS — Z7982 Long term (current) use of aspirin: Secondary | ICD-10-CM | POA: Insufficient documentation

## 2013-10-13 DIAGNOSIS — Q760 Spina bifida occulta: Secondary | ICD-10-CM | POA: Insufficient documentation

## 2013-10-13 DIAGNOSIS — Z01812 Encounter for preprocedural laboratory examination: Secondary | ICD-10-CM | POA: Insufficient documentation

## 2013-10-13 DIAGNOSIS — E7800 Pure hypercholesterolemia, unspecified: Secondary | ICD-10-CM | POA: Insufficient documentation

## 2013-10-13 DIAGNOSIS — C50919 Malignant neoplasm of unspecified site of unspecified female breast: Principal | ICD-10-CM | POA: Insufficient documentation

## 2013-10-13 DIAGNOSIS — Z803 Family history of malignant neoplasm of breast: Secondary | ICD-10-CM | POA: Insufficient documentation

## 2013-10-13 DIAGNOSIS — M47817 Spondylosis without myelopathy or radiculopathy, lumbosacral region: Secondary | ICD-10-CM | POA: Insufficient documentation

## 2013-10-13 DIAGNOSIS — Z01818 Encounter for other preprocedural examination: Secondary | ICD-10-CM | POA: Insufficient documentation

## 2013-10-13 HISTORY — PX: BREAST RECONSTRUCTION WITH PLACEMENT OF TISSUE EXPANDER AND FLEX HD (ACELLULAR HYDRATED DERMIS): SHX6295

## 2013-10-13 HISTORY — PX: PORTACATH PLACEMENT: SHX2246

## 2013-10-13 HISTORY — PX: MASTECTOMY W/ SENTINEL NODE BIOPSY: SHX2001

## 2013-10-13 SURGERY — MASTECTOMY WITH SENTINEL LYMPH NODE BIOPSY
Anesthesia: General | Site: Chest | Laterality: Left | Wound class: Contaminated

## 2013-10-13 MED ORDER — ONDANSETRON HCL 4 MG PO TABS: 4.0000 mg | ORAL_TABLET | Freq: Four times a day (QID) | ORAL | Status: DC | PRN

## 2013-10-13 MED ORDER — SODIUM CHLORIDE 0.9 % IJ SOLN
INTRAMUSCULAR | Status: DC | PRN
  Administered 2013-10-13: 08:00:00 via INTRAMUSCULAR

## 2013-10-13 MED ORDER — SODIUM CHLORIDE 0.9 % IR SOLN
Status: DC | PRN
  Administered 2013-10-13: 08:00:00

## 2013-10-13 MED ORDER — CHLORHEXIDINE GLUCONATE 4 % EX LIQD: 1.0000 "application " | Freq: Once | CUTANEOUS | Status: DC

## 2013-10-13 MED ORDER — HEPARIN SODIUM (PORCINE) 5000 UNIT/ML IJ SOLN
5000.0000 [IU] | Freq: Once | INTRAMUSCULAR | Status: AC
  Administered 2013-10-13: 5000 [IU] via SUBCUTANEOUS

## 2013-10-13 MED ORDER — PROPOFOL 10 MG/ML IV BOLUS
INTRAVENOUS | Status: DC | PRN
  Administered 2013-10-13: 150 mg via INTRAVENOUS
  Administered 2013-10-13: 50 mg via INTRAVENOUS

## 2013-10-13 MED ORDER — LACTATED RINGERS IV SOLN
INTRAVENOUS | Status: DC | PRN
  Administered 2013-10-13 (×2): via INTRAVENOUS

## 2013-10-13 MED ORDER — DEXTROSE-NACL 5-0.45 % IV SOLN: INTRAVENOUS | Status: DC

## 2013-10-13 MED ORDER — EPHEDRINE SULFATE 50 MG/ML IJ SOLN
INTRAMUSCULAR | Status: DC | PRN
  Administered 2013-10-13 (×2): 5 mg via INTRAVENOUS
  Administered 2013-10-13: 10 mg via INTRAVENOUS

## 2013-10-13 MED ORDER — HEPARIN SOD (PORK) LOCK FLUSH 100 UNIT/ML IV SOLN
INTRAVENOUS | Status: DC | PRN
  Administered 2013-10-13: 500 [IU] via INTRAVENOUS

## 2013-10-13 MED ORDER — FENTANYL CITRATE 0.05 MG/ML IJ SOLN
INTRAMUSCULAR | Status: AC
  Administered 2013-10-13: 25 ug via INTRAVENOUS
  Filled 2013-10-13: qty 2

## 2013-10-13 MED ORDER — 0.9 % SODIUM CHLORIDE (POUR BTL) OPTIME
TOPICAL | Status: DC | PRN
  Administered 2013-10-13 (×2): 1000 mL

## 2013-10-13 MED ORDER — DOCUSATE SODIUM 100 MG PO CAPS
100.0000 mg | ORAL_CAPSULE | Freq: Every day | ORAL | Status: DC
  Administered 2013-10-14: 100 mg via ORAL

## 2013-10-13 MED ORDER — CEFAZOLIN SODIUM-DEXTROSE 2-3 GM-% IV SOLR
INTRAVENOUS | Status: AC
  Filled 2013-10-13: qty 50

## 2013-10-13 MED ORDER — MORPHINE SULFATE 4 MG/ML IJ SOLN
4.0000 mg | INTRAMUSCULAR | Status: DC | PRN
Start: 2013-10-13 — End: 2013-10-14
  Administered 2013-10-13 (×2): 4 mg via INTRAVENOUS
  Filled 2013-10-13 (×2): qty 1

## 2013-10-13 MED ORDER — FENTANYL CITRATE 0.05 MG/ML IJ SOLN
INTRAMUSCULAR | Status: DC | PRN
  Administered 2013-10-13 (×11): 50 ug via INTRAVENOUS

## 2013-10-13 MED ORDER — CEFAZOLIN SODIUM-DEXTROSE 2-3 GM-% IV SOLR: 2.0000 g | INTRAVENOUS | Status: DC

## 2013-10-13 MED ORDER — HEPARIN SODIUM (PORCINE) 5000 UNIT/ML IJ SOLN
5000.0000 [IU] | Freq: Three times a day (TID) | INTRAMUSCULAR | Status: DC
  Filled 2013-10-13 (×2): qty 1

## 2013-10-13 MED ORDER — DEXAMETHASONE SODIUM PHOSPHATE 10 MG/ML IJ SOLN
INTRAMUSCULAR | Status: DC | PRN
  Administered 2013-10-13: 8 mg via INTRAVENOUS

## 2013-10-13 MED ORDER — GLYCOPYRROLATE 0.2 MG/ML IJ SOLN
INTRAMUSCULAR | Status: DC | PRN
  Administered 2013-10-13: .8 mg via INTRAVENOUS
  Administered 2013-10-13: .4 mg via INTRAVENOUS

## 2013-10-13 MED ORDER — KCL IN DEXTROSE-NACL 20-5-0.9 MEQ/L-%-% IV SOLN
INTRAVENOUS | Status: DC
  Administered 2013-10-13 – 2013-10-14 (×2): via INTRAVENOUS
  Filled 2013-10-13 (×3): qty 1000

## 2013-10-13 MED ORDER — HEPARIN SODIUM (PORCINE) 5000 UNIT/ML IJ SOLN
INTRAMUSCULAR | Status: AC
  Filled 2013-10-13: qty 1

## 2013-10-13 MED ORDER — SODIUM CHLORIDE 0.9 % IR SOLN
Status: DC | PRN
  Administered 2013-10-13: 11:00:00

## 2013-10-13 MED ORDER — ROCURONIUM BROMIDE 100 MG/10ML IV SOLN
INTRAVENOUS | Status: DC | PRN
  Administered 2013-10-13: 40 mg via INTRAVENOUS
  Administered 2013-10-13: 10 mg via INTRAVENOUS
  Administered 2013-10-13: 30 mg via INTRAVENOUS
  Administered 2013-10-13 (×3): 10 mg via INTRAVENOUS

## 2013-10-13 MED ORDER — SODIUM CHLORIDE 0.9 % IR SOLN
Status: DC | PRN
  Administered 2013-10-13: 1000 mL

## 2013-10-13 MED ORDER — ROCURONIUM BROMIDE 100 MG/10ML IV SOLN: INTRAVENOUS | Status: DC | PRN

## 2013-10-13 MED ORDER — ATORVASTATIN CALCIUM 10 MG PO TABS
10.0000 mg | ORAL_TABLET | Freq: Every day | ORAL | Status: DC
  Administered 2013-10-13: 10 mg via ORAL
  Filled 2013-10-13 (×2): qty 1

## 2013-10-13 MED ORDER — HEPARIN SOD (PORK) LOCK FLUSH 100 UNIT/ML IV SOLN
INTRAVENOUS | Status: AC
  Filled 2013-10-13: qty 5

## 2013-10-13 MED ORDER — TECHNETIUM TC 99M SULFUR COLLOID FILTERED
1.0000 | Freq: Once | INTRAVENOUS | Status: AC | PRN
  Administered 2013-10-13: 1 via INTRADERMAL

## 2013-10-13 MED ORDER — ARTIFICIAL TEARS OP OINT
TOPICAL_OINTMENT | OPHTHALMIC | Status: DC | PRN
  Administered 2013-10-13: 1 via OPHTHALMIC

## 2013-10-13 MED ORDER — OXYCODONE-ACETAMINOPHEN 5-325 MG PO TABS
1.0000 | ORAL_TABLET | ORAL | Status: DC | PRN
  Administered 2013-10-14 (×2): 2 via ORAL
  Filled 2013-10-13 (×2): qty 2

## 2013-10-13 MED ORDER — METHYLENE BLUE 1 % INJ SOLN
INTRAMUSCULAR | Status: AC
  Filled 2013-10-13: qty 10

## 2013-10-13 MED ORDER — BACITRACIN ZINC 500 UNIT/GM EX OINT
TOPICAL_OINTMENT | CUTANEOUS | Status: AC
  Filled 2013-10-13: qty 15

## 2013-10-13 MED ORDER — PROMETHAZINE HCL 25 MG/ML IJ SOLN: 6.2500 mg | INTRAMUSCULAR | Status: DC | PRN

## 2013-10-13 MED ORDER — BUPIVACAINE HCL (PF) 0.25 % IJ SOLN
INTRAMUSCULAR | Status: DC | PRN
  Administered 2013-10-13: 30 mL

## 2013-10-13 MED ORDER — ONDANSETRON HCL 4 MG/2ML IJ SOLN
INTRAMUSCULAR | Status: DC | PRN
  Administered 2013-10-13: 4 mg via INTRAVENOUS

## 2013-10-13 MED ORDER — NEOSTIGMINE METHYLSULFATE 1 MG/ML IJ SOLN
INTRAMUSCULAR | Status: DC | PRN
  Administered 2013-10-13: 5 mg via INTRAVENOUS

## 2013-10-13 MED ORDER — SODIUM CHLORIDE 0.9 % IJ SOLN
INTRAMUSCULAR | Status: AC
  Filled 2013-10-13: qty 10

## 2013-10-13 MED ORDER — METHOCARBAMOL 500 MG PO TABS
500.0000 mg | ORAL_TABLET | Freq: Four times a day (QID) | ORAL | Status: DC
  Administered 2013-10-13 – 2013-10-14 (×2): 500 mg via ORAL
  Filled 2013-10-13 (×5): qty 1

## 2013-10-13 MED ORDER — METHOCARBAMOL 500 MG PO TABS
500.0000 mg | ORAL_TABLET | Freq: Three times a day (TID) | ORAL | Status: DC
  Administered 2013-10-13: 500 mg via ORAL
  Filled 2013-10-13: qty 1

## 2013-10-13 MED ORDER — LIDOCAINE HCL (CARDIAC) 20 MG/ML IV SOLN
INTRAVENOUS | Status: DC | PRN
  Administered 2013-10-13: 100 mg via INTRAVENOUS

## 2013-10-13 MED ORDER — ONDANSETRON HCL 4 MG/2ML IJ SOLN
4.0000 mg | Freq: Four times a day (QID) | INTRAMUSCULAR | Status: DC | PRN
  Administered 2013-10-13 (×2): 4 mg via INTRAVENOUS
  Filled 2013-10-13 (×2): qty 2

## 2013-10-13 MED ORDER — FENTANYL CITRATE 0.05 MG/ML IJ SOLN
25.0000 ug | INTRAMUSCULAR | Status: DC | PRN
  Administered 2013-10-13: 25 ug via INTRAVENOUS

## 2013-10-13 MED ORDER — MIDAZOLAM HCL 5 MG/5ML IJ SOLN
INTRAMUSCULAR | Status: DC | PRN
  Administered 2013-10-13 (×2): 1 mg via INTRAVENOUS

## 2013-10-13 MED ORDER — PAROXETINE HCL 20 MG PO TABS
20.0000 mg | ORAL_TABLET | Freq: Every day | ORAL | Status: DC
  Administered 2013-10-13: 20 mg via ORAL
  Filled 2013-10-13 (×2): qty 1

## 2013-10-13 MED ORDER — CEFAZOLIN SODIUM 1-5 GM-% IV SOLN
1.0000 g | Freq: Four times a day (QID) | INTRAVENOUS | Status: DC
  Administered 2013-10-13 – 2013-10-14 (×3): 1 g via INTRAVENOUS
  Filled 2013-10-13 (×5): qty 50

## 2013-10-13 MED ORDER — LEVOTHYROXINE SODIUM 50 MCG PO TABS
50.0000 ug | ORAL_TABLET | Freq: Every day | ORAL | Status: DC
  Administered 2013-10-14: 50 ug via ORAL
  Filled 2013-10-13 (×2): qty 1

## 2013-10-13 SURGICAL SUPPLY — 113 items
ADH SKN CLS APL DERMABOND .7 (GAUZE/BANDAGES/DRESSINGS) ×6
APPLIER CLIP 9.375 MED OPEN (MISCELLANEOUS) ×8
APR CLP MED 9.3 20 MLT OPN (MISCELLANEOUS) ×6
ATCH SMKEVC FLXB CAUT HNDSWH (FILTER) ×3 IMPLANT
BAG DECANTER FOR FLEXI CONT (MISCELLANEOUS) ×9 IMPLANT
BANDAGE ELASTIC 6 VELCRO ST LF (GAUZE/BANDAGES/DRESSINGS) ×3 IMPLANT
BINDER BREAST LRG (GAUZE/BANDAGES/DRESSINGS) IMPLANT
BINDER BREAST XLRG (GAUZE/BANDAGES/DRESSINGS) IMPLANT
BIOPATCH RED 1 DISK 7.0 (GAUZE/BANDAGES/DRESSINGS) ×8 IMPLANT
BLADE SURG 15 STRL LF DISP TIS (BLADE) ×3 IMPLANT
BLADE SURG 15 STRL SS (BLADE) ×8
CANISTER SUCTION 2500CC (MISCELLANEOUS) ×7 IMPLANT
CHLORAPREP W/TINT 10.5 ML (MISCELLANEOUS) ×3 IMPLANT
CHLORAPREP W/TINT 26ML (MISCELLANEOUS) ×8 IMPLANT
CLIP APPLIE 9.375 MED OPEN (MISCELLANEOUS) IMPLANT
CLOTH BEACON ORANGE TIMEOUT ST (SAFETY) ×5 IMPLANT
CONT SPEC 4OZ CLIKSEAL STRL BL (MISCELLANEOUS) ×4 IMPLANT
COVER PROBE W GEL 5X96 (DRAPES) ×4 IMPLANT
COVER SURGICAL LIGHT HANDLE (MISCELLANEOUS) ×8 IMPLANT
CRADLE DONUT ADULT HEAD (MISCELLANEOUS) ×4 IMPLANT
DECANTER SPIKE VIAL GLASS SM (MISCELLANEOUS) ×3 IMPLANT
DERMABOND ADVANCED (GAUZE/BANDAGES/DRESSINGS) ×2
DERMABOND ADVANCED .7 DNX12 (GAUZE/BANDAGES/DRESSINGS) ×6 IMPLANT
DRAIN CHANNEL 19F RND (DRAIN) ×13 IMPLANT
DRAPE C-ARM 42X72 X-RAY (DRAPES) ×4 IMPLANT
DRAPE CHEST BREAST 15X10 FENES (DRAPES) ×4 IMPLANT
DRAPE LAPAROSCOPIC ABDOMINAL (DRAPES) ×4 IMPLANT
DRAPE ORTHO SPLIT 77X108 STRL (DRAPES) ×8
DRAPE PROXIMA HALF (DRAPES) ×12 IMPLANT
DRAPE SURG 17X11 SM STRL (DRAPES) ×4 IMPLANT
DRAPE SURG 17X23 STRL (DRAPES) ×6 IMPLANT
DRAPE SURG ORHT 6 SPLT 77X108 (DRAPES) ×6 IMPLANT
DRAPE UTILITY 15X26 W/TAPE STR (DRAPE) ×8 IMPLANT
DRAPE WARM FLUID 44X44 (DRAPE) ×4 IMPLANT
DRSG PAD ABDOMINAL 8X10 ST (GAUZE/BANDAGES/DRESSINGS) ×13 IMPLANT
DRSG TEGADERM 4X4.75 (GAUZE/BANDAGES/DRESSINGS) ×8 IMPLANT
ELECT BLADE 6.5 EXT (BLADE) IMPLANT
ELECT CAUTERY BLADE 6.4 (BLADE) ×8 IMPLANT
ELECT REM PT RETURN 9FT ADLT (ELECTROSURGICAL) ×4
ELECTRODE REM PT RTRN 9FT ADLT (ELECTROSURGICAL) ×6 IMPLANT
EVACUATOR SILICONE 100CC (DRAIN) ×13 IMPLANT
EVACUATOR SMOKE ACCUVAC VALLEY (FILTER) ×1
GAUZE SPONGE 4X4 16PLY XRAY LF (GAUZE/BANDAGES/DRESSINGS) ×4 IMPLANT
GAUZE XEROFORM 5X9 LF (GAUZE/BANDAGES/DRESSINGS) ×4 IMPLANT
GLOVE BIO SURGEON STRL SZ 6.5 (GLOVE) ×2 IMPLANT
GLOVE BIO SURGEON STRL SZ7.5 (GLOVE) ×13 IMPLANT
GLOVE BIOGEL PI IND STRL 7.0 (GLOVE) IMPLANT
GLOVE BIOGEL PI IND STRL 7.5 (GLOVE) IMPLANT
GLOVE BIOGEL PI IND STRL 8 (GLOVE) ×3 IMPLANT
GLOVE BIOGEL PI INDICATOR 7.0 (GLOVE) ×6
GLOVE BIOGEL PI INDICATOR 7.5 (GLOVE) ×2
GLOVE BIOGEL PI INDICATOR 8 (GLOVE) ×1
GLOVE SS BIOGEL STRL SZ 6.5 (GLOVE) IMPLANT
GLOVE SUPERSENSE BIOGEL SZ 6.5 (GLOVE) ×2
GOWN STRL NON-REIN LRG LVL3 (GOWN DISPOSABLE) ×16 IMPLANT
GOWN STRL REIN XL XLG (GOWN DISPOSABLE) ×4 IMPLANT
IMPL BREAST TIS EXP M 450CC (Breast) IMPLANT
IMPLANT BREAST TIS EXP M 450CC (Breast) ×8 IMPLANT
INTRODUCER COOK 11FR (CATHETERS) IMPLANT
KIT BASIN OR (CUSTOM PROCEDURE TRAY) ×8 IMPLANT
KIT PORT POWER 8FR ISP CVUE (Catheter) ×1 IMPLANT
KIT PORT POWER 9.6FR MRI PREA (Catheter) IMPLANT
KIT PORT POWER ISP 8FR (Catheter) IMPLANT
KIT POWER CATH 8FR (Catheter) IMPLANT
KIT ROOM TURNOVER OR (KITS) ×7 IMPLANT
MARKER SKIN DUAL TIP RULER LAB (MISCELLANEOUS) ×7 IMPLANT
NDL 18GX1X1/2 (RX/OR ONLY) (NEEDLE) ×3 IMPLANT
NDL 21 GA WING INFUSION (NEEDLE) IMPLANT
NDL HYPO 25GX1X1/2 BEV (NEEDLE) ×3 IMPLANT
NEEDLE 18GX1X1/2 (RX/OR ONLY) (NEEDLE) ×4 IMPLANT
NEEDLE 21 GA WING INFUSION (NEEDLE) ×4 IMPLANT
NEEDLE 22X1 1/2 (OR ONLY) (NEEDLE) IMPLANT
NEEDLE HYPO 25GX1X1/2 BEV (NEEDLE) ×8 IMPLANT
NS IRRIG 1000ML POUR BTL (IV SOLUTION) ×12 IMPLANT
PACK GENERAL/GYN (CUSTOM PROCEDURE TRAY) ×7 IMPLANT
PACK SURGICAL SETUP 50X90 (CUSTOM PROCEDURE TRAY) ×4 IMPLANT
PAD ARMBOARD 7.5X6 YLW CONV (MISCELLANEOUS) ×12 IMPLANT
PENCIL BUTTON HOLSTER BLD 10FT (ELECTRODE) ×5 IMPLANT
PREFILTER EVAC NS 1 1/3-3/8IN (MISCELLANEOUS) ×4 IMPLANT
SET ASEPTIC TRANSFER (MISCELLANEOUS) ×1 IMPLANT
SET INTRODUCER 12FR PACEMAKER (SHEATH) IMPLANT
SET SHEATH INTRODUCER 10FR (MISCELLANEOUS) IMPLANT
SHEARS HARMONIC 9CM CVD (BLADE) IMPLANT
SHEATH COOK PEEL AWAY SET 9F (SHEATH) IMPLANT
SPECIMEN JAR LARGE (MISCELLANEOUS) ×3 IMPLANT
SPECIMEN JAR X LARGE (MISCELLANEOUS) ×2 IMPLANT
SPONGE GAUZE 4X4 12PLY (GAUZE/BANDAGES/DRESSINGS) ×4 IMPLANT
SUT ETHILON 3 0 FSL (SUTURE) ×3 IMPLANT
SUT MNCRL AB 3-0 PS2 18 (SUTURE) ×16 IMPLANT
SUT MNCRL AB 4-0 PS2 18 (SUTURE) ×4 IMPLANT
SUT MON AB 4-0 PC3 18 (SUTURE) ×4 IMPLANT
SUT PDS AB 3-0 SH 27 (SUTURE) IMPLANT
SUT PROLENE 2 0 SH 30 (SUTURE) ×8 IMPLANT
SUT PROLENE 3 0 PS 2 (SUTURE) ×10 IMPLANT
SUT SILK 2 0 (SUTURE)
SUT SILK 2-0 18XBRD TIE 12 (SUTURE) IMPLANT
SUT VIC AB 3-0 54X BRD REEL (SUTURE) ×3 IMPLANT
SUT VIC AB 3-0 BRD 54 (SUTURE)
SUT VIC AB 3-0 SH 18 (SUTURE) ×9 IMPLANT
SUT VIC AB 3-0 SH 27 (SUTURE)
SUT VIC AB 3-0 SH 27XBRD (SUTURE) ×3 IMPLANT
SYR 20ML ECCENTRIC (SYRINGE) ×8 IMPLANT
SYR 5ML LUER SLIP (SYRINGE) ×4 IMPLANT
SYR BULB IRRIGATION 50ML (SYRINGE) ×5 IMPLANT
SYR CONTROL 10ML LL (SYRINGE) ×4 IMPLANT
SYRINGE 10CC LL (SYRINGE) IMPLANT
TAPE CLOTH SURG 4X10 WHT LF (GAUZE/BANDAGES/DRESSINGS) ×1 IMPLANT
TOWEL OR 17X24 6PK STRL BLUE (TOWEL DISPOSABLE) ×7 IMPLANT
TOWEL OR 17X26 10 PK STRL BLUE (TOWEL DISPOSABLE) ×8 IMPLANT
TOWEL OR NON WOVEN STRL DISP B (DISPOSABLE) ×3 IMPLANT
TRAY FOLEY CATH 14FRSI W/METER (CATHETERS) ×1 IMPLANT
TUBE CONNECTING 12X1/4 (SUCTIONS) ×5 IMPLANT
WATER STERILE IRR 1000ML POUR (IV SOLUTION) IMPLANT

## 2013-10-13 NOTE — Anesthesia Procedure Notes (Signed)
Procedure Name: Intubation Date/Time: 10/13/2013 7:57 AM Performed by: Sherie Don Pre-anesthesia Checklist: Patient identified, Emergency Drugs available, Suction available, Patient being monitored and Timeout performed Patient Re-evaluated:Patient Re-evaluated prior to inductionOxygen Delivery Method: Circle system utilized Preoxygenation: Pre-oxygenation with 100% oxygen Intubation Type: IV induction Ventilation: Mask ventilation without difficulty Laryngoscope Size: Mac and 3 Grade View: Grade I Tube type: Oral Tube size: 7.0 mm Number of attempts: 1 Airway Equipment and Method: Stylet Placement Confirmation: ETT inserted through vocal cords under direct vision,  positive ETCO2 and breath sounds checked- equal and bilateral Secured at: 22 cm Tube secured with: Tape Dental Injury: Teeth and Oropharynx as per pre-operative assessment

## 2013-10-13 NOTE — Transfer of Care (Signed)
Immediate Anesthesia Transfer of Care Note  Patient: Barbara [name]  Procedure(s) Performed: Procedure(s):  BILATERAL MASTECTOMY WITH RIGHT SENTINEL LYMPH NODE BIOPSY (Bilateral) INSERTION PORT-A-CATH (Left) PLACEMENT OF BILATERAL TISSUE EXPANDER FOR BREAST RECONSTRUCTION  (Bilateral)  Patient Location: PACU  Anesthesia Type:General  Level of Consciousness: sedated and patient cooperative  Airway & Oxygen Therapy: Spontaneous breathing face mask  Post-op Assessment: Report given to PACU RN and Post -op Vital signs reviewed and stable  Post vital signs: Reviewed and stable  Complications: No apparent anesthesia complications

## 2013-10-13 NOTE — Preoperative (Signed)
Beta Blockers   Reason not to administer Beta Blockers:Not Applicable 

## 2013-10-13 NOTE — Anesthesia Postprocedure Evaluation (Signed)
  Anesthesia Post-op Note  Patient: Barbara [name]  Procedure(s) Performed: Procedure(s):  BILATERAL MASTECTOMY WITH RIGHT SENTINEL LYMPH NODE BIOPSY (Bilateral) INSERTION PORT-A-CATH (Left) PLACEMENT OF BILATERAL TISSUE EXPANDER FOR BREAST RECONSTRUCTION  (Bilateral)  Patient Location: PACU  Anesthesia Type:General  Level of Consciousness: awake  Airway and Oxygen Therapy: Patient Spontanous Breathing  Post-op Pain: mild  Post-op Assessment: Post-op Vital signs reviewed  Post-op Vital Signs: Reviewed  Complications: No apparent anesthesia complications 

## 2013-10-13 NOTE — H&P (View-Only) (Signed)
Patient ID: Barbara [name], female   DOB: 1951-07-24, 62 y.o.   MRN: [id number]  No chief complaint on file.   HPI Barbara [name] is a 62 y.o. female.  We're asked to see the patient in consultation by Dr. Isabell Jarvis to evaluate her for a right breast cancer. The patient is a 62 year old white female who recently went for a routine screening mammogram. At that point her mammogram was negative. She has a very strong family history of breast cancer and because of this also got an MRI study. The MRI showed a small 1.7 cm cancer posteriorly in the breast near the chest wall. She denies any breast pain. She denies any discharge from the nipple. 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 She has been tested in his BRCA negative but she apparently has not had the full genetic panel. She also took 5 years of tamoxifen which she finished in 1999.  HPI  Past Medical History  Diagnosis Date  . Breast cancer   . Hashimoto's thyroiditis     with goiter  . Hypercholesteremia   . 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  Narrowing of the 4th and 5th lumbar interspaces.   Marland Kitchen Spina bifida     occutta L5  . Anxiety     Past Surgical History  Procedure Laterality Date  . Hernia repair    . Tubal ligation    . Cystoscopy with urethral caruncle      Family History  Problem Relation Age of Onset  . Breast cancer Mother   . Lung cancer Father   . Breast cancer Sister   . Prostate cancer Brother   . Breast cancer Maternal Aunt   . Breast cancer Sister   . Breast cancer Sister     Social History History  Substance Use Topics  . Smoking status: Never Smoker   . Smokeless tobacco: Not on file  . Alcohol Use: Yes    Allergies  Allergen Reactions  . Buprenex [Buprenorphine] Nausea And Vomiting and Other (See Comments)    Oversedation  . Simvastatin Other  (See Comments)    Myalgias   . Codeine Hives, Itching and Rash    Current Outpatient Prescriptions  Medication Sig Dispense Refill  . aspirin 81 MG tablet Take 81 mg by mouth daily.      . Aspirin-Caffeine (BAYER BACK & BODY PAIN EX ST) 500-32.5 MG TABS Take 1 tablet by mouth as needed. 1 tab only PRN for headache or back ache      . atorvastatin (LIPITOR) 10 MG tablet Take 10 mg by mouth daily.      . Calcium-Vitamin D-Vitamin K (VIACTIV) 500-500-40 MG-UNT-MCG CHEW Chew 2 tablets by mouth.      . cetirizine (ZYRTEC) 10 MG tablet Take 10 mg by mouth daily as needed for allergies. PRN allergy season      . Cholecalciferol (D3-1000) 1000 UNITS tablet Take 1,000 Units by mouth daily.      . Coenzyme Q10 (CO Q-10) 100 MG CAPS Take 2 tablets by mouth daily.      Marland Kitchen levothyroxine (SYNTHROID, LEVOTHROID) 50 MCG tablet Take 50 mcg by mouth daily before breakfast.      . mometasone (NASONEX) 50 MCG/ACT nasal spray Place 2 sprays into the nose as needed.      Marland Kitchen  Nutritional Supplements (JUICE PLUS FIBRE PO) Take 1 each by mouth daily.      Marland Kitchen PARoxetine (PAXIL) 20 MG tablet Take 20 mg by mouth daily.       No current facility-administered medications for this visit.    Review of Systems Review of Systems  Constitutional: Negative.   HENT: Negative.   Eyes: Negative.   Respiratory: Negative.   Cardiovascular: Negative.   Gastrointestinal: Negative.   Endocrine: Negative.   Genitourinary: Negative.   Musculoskeletal: Negative.   Skin: Negative.   Allergic/Immunologic: Negative.   Neurological: Negative.   Hematological: Negative.   Psychiatric/Behavioral: Negative.     There were no vitals taken for this visit.  Physical Exam Physical Exam  Constitutional: She is oriented to person, place, and time. She appears well-developed and well-nourished.  HENT:  Head: Normocephalic and atraumatic.  Eyes: Conjunctivae and EOM are normal. Pupils are equal, round, and reactive to light.  Neck:  Normal range of motion. Neck supple.  Cardiovascular: Normal rate, regular rhythm and normal heart sounds.   Pulmonary/Chest: Effort normal and breath sounds normal.  There is no palpable mass in either breast. There is no palpable axillary, supraclavicular, or cervical lymphadenopathy.  Abdominal: Soft. Bowel sounds are normal. She exhibits no mass. There is no tenderness.  Musculoskeletal: Normal range of motion.  Lymphadenopathy:    She has no cervical adenopathy.  Neurological: She is alert and oriented to person, place, and time.  Skin: Skin is warm and dry.  Psychiatric: She has a normal mood and affect. Her behavior is normal.    Data Reviewed As above  Assessment    The patient appears to have a small cancer in the right breast. I've talked to her in detail about the different options for surgical treatment. Given her strong family history she favors bilateral mastectomies with reconstruction. I think this is a reasonable option. I've discussed with her in detail the risks and benefits of the operation to this as well as some of the technical aspects and she understands and wishes to proceed. She will need to meet with the plastic surgeon so that we can coordinate our schedules     Plan    Plan for bilateral mastectomies with right sentinel node biopsy and reconstruction        TOTH III,PAUL S 09/28/2013, 12:23 PM

## 2013-10-13 NOTE — Anesthesia Postprocedure Evaluation (Signed)
  Anesthesia Post-op Note  Patient: Barbara [name]  Procedure(s) Performed: Procedure(s):  BILATERAL MASTECTOMY WITH RIGHT SENTINEL LYMPH NODE BIOPSY (Bilateral) INSERTION PORT-A-CATH (Left) PLACEMENT OF BILATERAL TISSUE EXPANDER FOR BREAST RECONSTRUCTION  (Bilateral)  Patient Location: PACU  Anesthesia Type:General  Level of Consciousness: awake  Airway and Oxygen Therapy: Patient Spontanous Breathing  Post-op Pain: mild  Post-op Assessment: Post-op Vital signs reviewed  Post-op Vital Signs: Reviewed  Complications: No apparent anesthesia complications

## 2013-10-13 NOTE — Brief Op Note (Signed)
10/13/2013  1:22 PM  PATIENT:  Barbara [name]  62 y.o. female  PRE-OPERATIVE DIAGNOSIS:  RIGHT BREAST CANCER  POST-OPERATIVE DIAGNOSIS:  RIGHT BREAST CANCER  PROCEDURE:  Procedure(s):  BILATERAL MASTECTOMY WITH RIGHT SENTINEL LYMPH NODE BIOPSY (Bilateral) INSERTION PORT-A-CATH (Left) PLACEMENT OF BILATERAL TISSUE EXPANDER FOR BREAST RECONSTRUCTION  (Bilateral)  SURGEON:  Surgeon(s) and Role: Panel 1:    * Robyne Askew, MD - Primary  Panel 2:    * Etter Sjogren, MD - Primary  PHYSICIAN ASSISTANT:   ASSISTANTS: none   ANESTHESIA:   general  EBL:  Total I/O In: 3600 [I.V.:3600] Out: 1400 [Urine:1150; Blood:250]  BLOOD ADMINISTERED:none  DRAINS: (2) Jackson-Pratt drain(s) with closed bulb suction in the left chest (1) and left chest (1)   LOCAL MEDICATIONS USED:  NONE  SPECIMEN:  No Specimen  DISPOSITION OF SPECIMEN:  N/A  COUNTS:  YES  TOURNIQUET:  * No tourniquets in log *  DICTATION: .Other Dictation: Dictation Number [id number]  PLAN OF CARE: Admit to inpatient   PATIENT DISPOSITION:  PACU - hemodynamically stable.   Delay start of Pharmacological VTE agent (>24hrs) due to surgical blood loss or risk of bleeding: no (patient had pre-op subcu heparin)

## 2013-10-13 NOTE — Interval H&P Note (Signed)
History and Physical Interval Note:  10/13/2013 7:15 AM  Barbara [name]  has presented today for surgery, with the diagnosis of right breast cancer  The various methods of treatment have been discussed with the patient and family. After consideration of risks, benefits and other options for treatment, the patient has consented to  Procedure(s):  BILATERAL MASTECTOMY WITH RIGHT SENTINEL LYMPH NODE BIOPSY (Bilateral) INSERTION PORT-A-CATH (N/A) PLACEMENT OF BILATERAL TISSUE EXPANDER FOR BREAST RECONSTRUCTION/POSSIBLE FLEX HD (ACELLULAR HYDRATED DERMIS) PLACEMENT (Bilateral) as a surgical intervention .  The patient's history has been reviewed, patient examined, no change in status, stable for surgery.  I have reviewed the patient's chart and labs.  Questions were answered to the patient's satisfaction.     TOTH III,PAUL S

## 2013-10-13 NOTE — Anesthesia Preprocedure Evaluation (Addendum)
Anesthesia Evaluation  Patient identified by MRN, date of birth, ID band Patient awake    Reviewed: Allergy & Precautions, H&P , NPO status   Airway Mallampati: II      Dental   Pulmonary  breath sounds clear to auscultation        Cardiovascular negative cardio ROS  Rhythm:Regular Rate:Normal     Neuro/Psych    GI/Hepatic Neg liver ROS, GERD-  ,  Endo/Other  negative endocrine ROS  Renal/GU      Musculoskeletal   Abdominal   Peds  Hematology   Anesthesia Other Findings   Reproductive/Obstetrics                          Anesthesia Physical Anesthesia Plan  ASA: III  Anesthesia Plan: General   Post-op Pain Management:    Induction:   Airway Management Planned: Oral ETT  Additional Equipment:   Intra-op Plan:   Post-operative Plan: Possible Post-op intubation/ventilation  Informed Consent: I have reviewed the patients History and Physical, chart, labs and discussed the procedure including the risks, benefits and alternatives for the proposed anesthesia with the patient or authorized representative who has indicated his/her understanding and acceptance.   Dental advisory given  Plan Discussed with: CRNA, Anesthesiologist and Surgeon  Anesthesia Plan Comments:         Anesthesia Quick Evaluation

## 2013-10-13 NOTE — Op Note (Signed)
10/13/2013  10:54 AM  PATIENT:  Barbara [name]  62 y.o. female  PRE-OPERATIVE DIAGNOSIS:  RIGHT BREAST CANCER  POST-OPERATIVE DIAGNOSIS:  RIGHT BREAST CANCER  PROCEDURE:  Procedure(s):  BILATERAL MASTECTOMY WITH RIGHT SENTINEL LYMPH NODE BIOPSY INSERTION PORT-A-CATH (Left)   SURGEON:  Surgeon(s) and Role: Panel 1:    * Robyne Askew, MD - Primary  Panel 2:    * Etter Sjogren, MD - Primary  PHYSICIAN ASSISTANT:   ASSISTANTS: none   ANESTHESIA:   general  EBL:  Total I/O In: 2900 [I.V.:2900] Out: 700 [Urine:300; Blood:400]  BLOOD ADMINISTERED:none  DRAINS: none   LOCAL MEDICATIONS USED:  NONE  SPECIMEN:  Source of Specimen:  bilateral breasts and right sentinel nodes X 2  DISPOSITION OF SPECIMEN:  PATHOLOGY  COUNTS:  YES  TOURNIQUET:  * No tourniquets in log *  DICTATION: .Dragon Dictation After informed consent was obtained the patient was brought to the operating room and placed in the supine position on the operating room table. After adequate induction of general anesthesia the arms were tucked and a roll was placed between the patient's shoulder blades. The left neck and chest area was then prepped with ChloraPrep allowed to dry and draped in usual sterile manner. The patient was placed in Trendelenburg position. A small incision was made on the left just lateral to the bend of the clavicle with a 15 blade knife. A large bore finder needle from the Port-A-Cath kit was used to slide beneath the bend of the clavicle heading towards the sternal notch and in doing so we were able to access the left subclavian vein without difficulty. A wire was fed through the needle without difficulty. The needle was then removed. The wire was confirmed in the central venous system using real-time fluoroscopy. Next the incision on the left chest wall was extended medially and laterally. A subcutaneous pocket was then created inferior to the incision using a combination of blunt  finger dissection and sharp dissection with the electrocautery. The tubing was then placed on the reservoir of the port and the length of the tubing was estimated also using real-time fluoroscopy. The tubing length was then cut to about 19 cm. Next the sheath and dilator were placed over the wire using the Seldinger technique without difficulty. The wire and dilator were removed. The tubing was fed through the sheath as far as it could be fed and then held in place while the sheath was gently cracked and separated. Next a roll time fluoroscopy image confirmed the tip of the catheter to be in the superior vena cava. The anchor was then used to permanently attach the tubing to the reservoir. The reservoir was anchored in the pocket with 2 2-0 Prolene stitches. The port was then aspirated and it aspirated blood easily. It was flushed initially with dilute heparin solution and then with a more concentrated heparin solution. The subcutaneous tissue was then closed with interrupted 0 Vicryl stitches. The skin was closed with a running 4-0 Monocryl subcuticular stitch. Dermabond dressings were applied. The patient tolerated the procedure well. At this point the drapes were removed and the arms were placed out laterally. The bilateral chest, breast, and Axillary area were then prepped with ChloraPrep, Allowed to dry, and draped in usual sterile manner. Earlier in the day the patient underwent injection of 1 mCi of technetium sulfur colloid in the subareolar position on the right. At this point, 2 cc of methylene blue and 3 cc of injectable  saline were also injected in the subareolar position on the right. Elliptical incisions were mapped out around the nipple and areola complex in a manner to minimize the skin removed. Attention was first turned to the left breast. The incision was made along the lines with a 10 blade knife. This incision was carried through the skin and subcutaneous tissue sharply with electrocautery.  Breast hooks were used to elevate the skin flaps anteriorly towards the ceiling. Thin skin flaps were then created between the subcutaneous fat and the breast tissue circumferentially until the dissection reached the chest wall. Next the breast was removed from the pectoralis muscle with the pectoralis fascia. This dissection was also done sharply with the electrocautery. The breast was removed it was oriented with a stitch on the lateral aspect and sent to pathology for further evaluation. Hemostasis was achieved using the Bovie electrocautery. A couple small vessels were controlled with clips. The wound was then irrigated with copious amounts of saline. The wound was then packed with him moistened lap sponge and covered with a sterile blue towel. Attention was then turned to the right breast. A similar incision was made along the lines with a 10 blade knife. This incision was carried through the skin and subcutaneous tissue sharply with electrocautery. Breast hooks were then used to elevate the skin flaps anteriorly towards the ceiling. Thin skin flaps were created circumferentially around the incision between the subcutaneous fat and the breast tissue until the dissection reached the chest wall. Once the dissection was done laterally the neoprobe was used to identify the hot spot in the right axilla. Blunt dissection was carried out in this area until a hot blue lymph node was identified. This lymph node was excised sharply with electrocautery and sent to pathology for further evaluation. There were actually 2 lymph nodes in the specimen and both were negative for cancer cells on touch prep. Next the breast was removed from the pectoralis muscle with the pectoralis fascia. This dissection was also done sharply with electrocautery. Once the breast was removed it was oriented with a stitch on the lateral aspect and sent to pathology for further evaluation. The wound was irrigated with copious amounts of saline.  Hemostasis was achieved using the Bovie electrocautery. A couple small vessels were controlled with clips. This wound was also packed with a moistened lap sponge and covered with a sterile blue towel. The patient has tolerated this portion of the procedure well and is hemodynamically stable. At this point the operation was turned over to Dr. Odis Luster for the reconstruction and his portion will be dictated separately. All needle sponge and assessment counts were correct. The skin flaps appeared healthy.  PLAN OF CARE: Admit for overnight observation  PATIENT DISPOSITION:  PACU - hemodynamically stable.   Delay start of Pharmacological VTE agent (>24hrs) due to surgical blood loss or risk of bleeding: no

## 2013-10-14 ENCOUNTER — Encounter (HOSPITAL_COMMUNITY): Payer: Self-pay | Admitting: General Surgery

## 2013-10-14 MED ORDER — OXYCODONE-ACETAMINOPHEN 5-325 MG PO TABS: 1.0000 | ORAL_TABLET | ORAL | Status: DC | PRN

## 2013-10-14 NOTE — Progress Notes (Signed)
Subjective: Sore but good pain control. Nausea has resolved.  Objective: Vital signs in last 24 hours: Temp:  [97 F (36.1 C)-98.5 F (36.9 C)] 98.5 F (36.9 C) (11/21 0523) Pulse Rate:  [66-81] 77 (11/21 0523) Resp:  [12-20] 20 (11/21 0523) BP: (102-146)/(54-71) 111/54 mmHg (11/21 0523) SpO2:  [98 %-100 %] 98 % (11/21 0523) Weight:  [151 lb (68.493 kg)] 151 lb (68.493 kg) (11/20 1550)  Intake/Output from previous day: 11/20 0701 - 11/21 0700 In: 5067.5 [I.V.:5067.5] Out: 2665 [Urine:2300; Drains:115; Blood:250] Intake/Output this shift:    Operative sites: Mastectomy flaps look very good. All are viable. Tissue expanders appear to be in good position. Drains functioning. Drainage thin.   Recent Labs  10/12/13 0940  WBC 6.1  HGB 14.4  HCT 41.4  NA 138  K 4.3  CL 102  CO2 27  BUN 18  CREATININE 0.91    Studies/Results: Dg Chest 2 View  10/12/2013   CLINICAL DATA:  Breast cancer  EXAM: CHEST  2 VIEW  COMPARISON:  None.  FINDINGS: The heart size and mediastinal contours are within normal limits. Both lungs are clear. No pneumothorax or pleural effusion is noted. The visualized skeletal structures are unremarkable.  IMPRESSION: No active cardiopulmonary disease.   Electronically Signed   By: Roque Lias M.D.   On: 10/12/2013 12:44   Nm Sentinel Node Inj-no Rpt (breast)  10/13/2013   CLINICAL DATA: right breast cancer   Sulfur colloid was injected intradermally by the nuclear medicine  technologist for breast cancer sentinel node localization.    Dg Chest Port 1 View  10/13/2013   CLINICAL DATA:  Port placement  EXAM: PORTABLE CHEST - 1 VIEW  COMPARISON:  Portable exam 1350 hr compared to 10/12/2013  FINDINGS: Left subclavian power port with tip projecting over mid SVC.  Surgical drains and tissues expanders at the anterior chest bilaterally.  Surgical clips left axilla.  Upper normal heart size.  Normal mediastinal contours and pulmonary vascularity.  Lungs clear.  No  pleural effusion or pneumothorax.  IMPRESSION: No pneumothorax following left subclavian Port-A-Cath insertion.  Postoperative changes with clear lungs.   Electronically Signed   By: Ulyses Southward M.D.   On: 10/13/2013 14:19   Dg Fluoro Guide Cv Line-no Report  10/13/2013   CLINICAL DATA: BREAST CANCER   FLOURO GUIDE CV LINE  Fluoroscopy was utilized by the requesting physician.  No radiographic  interpretation.     Assessment/Plan: Ambulate. DVT prophylaxis. Continue IV antibiotic prophylaxis.   LOS: 1 day    Etter Sjogren M 10/14/2013 8:15 AM

## 2013-10-14 NOTE — Progress Notes (Signed)
Patient discharged to home with instructions, verified understanding.

## 2013-10-14 NOTE — Op Note (Signed)
NAMEFRANCES, [name] NO.:  [id number]  MEDICAL RECORD NO.:  [id number]  LOCATION:                               FACILITY:  [facility]  PHYSICIAN:  Barbara [name], M.D.     DATE OF BIRTH:  12-17-1950  DATE OF PROCEDURE:  10/13/2013 DATE OF DISCHARGE:  10/14/2103                              OPERATIVE REPORT   PREOPERATIVE DIAGNOSIS:  Breast cancer.  POSTOPERATIVE DIAGNOSIS:  Breast cancer.  PROCEDURE PERFORMED:  Bilateral breast reconstruction with tissue expanders.  SURGEON:  Barbara Sjogren, MD  ANESTHESIA:  General.  ESTIMATED BLOOD LOSS:  20 mL.  DRAINS:  One 19-French on each side.  CLINICAL NOTE:  A 62 year old woman has breast cancer and is having bilateral mastectomy.  She desired reconstruction.  Options were discussed and she selected placement of tissue expanders, a planned staged procedure for eventual placement of implant.  Next, procedure risks and possible complications were discussed with her in great detail.  These risks include, but not limited to, bleeding, infection, healing problems, scarring, loss of sensation, fluid accumulations, anesthesia complications, loss of sensation, failure of device, capsular contracture, displacement of device, wrinkles, ripples, pneumothorax, DVT, pulmonary embolism, contour deformity at the periphery of the reconstruction, asymmetry, disappointment, and she understood all of these and wished to proceed.  DESCRIPTION OF PROCEDURE:  The patient was in the operating room and bilateral mastectomy be completed.  The skin flaps were inspected and found to have excellent color with bright red bleeding at the periphery consistent viability.  Irrigation with saline, and then the dissection was carried deep to the pectoralis major muscle beginning at the lateral border and then developing a submuscular space as well as serratus anterior lifted for very short distance lateral.  Dissection was continued inferior down to  the inframammary crease that had been marked preoperatively.  Thorough irrigation with saline as well as antibiotic solution, meticulous hemostasis with electrocautery.  Antibiotic solution was placed in the space not to dwell there.  After thoroughly cleaning gloves, the expanders were prepared.  These were mentored 450 mL tissue expanders.  The air was removed, and the closed filling system was used to place 150 mL of sterile saline.  The expanders were returned to the antibiotic solution to soak and again the submuscular spaces were inspected, found to be in excellent condition, and no evidence of any bleeding.  Great care was taken throughout the dissection of the submuscular space to avoid any entry into the chest cavity or any damage to the oral cavity.  The expanders were then positioned and care was taken to make sure that they were oriented properly and seated at the inferior aspect of the submuscular space.  The muscle was then closed after again irrigating the top of the expander with antibiotic solution. The muscle closure with 3-0 Vicryl interrupted sutures, and then a 19- [place] drain was positioned on each side brought through separate stab wound inferolaterally, and the entire drain was left, so at the end, the drain was under the inferior mastectomy flap at the medial aspect. These drains were secured with 3-0 Prolene sutures.  The skin closures with 3-0 Monocryl inverted deep dermal sutures, followed  by Dermabond.  Biopatch and a medium Tegaderm placed around the drains and dry sterile dressings with the Chestvest were positioned. The skin appeared to have excellent color in both superior and inferior mastectomy flaps, bilateral.  She was transferred to the recovery room in stable having tolerated the procedure well.     Barbara [name], M.D.     DB/MEDQ  D:  10/13/2013  T:  10/14/2013  Job:  [id number]

## 2013-10-14 NOTE — Progress Notes (Signed)
1 Day Post-Op  Subjective: No complaints today  Objective: Vital signs in last 24 hours: Temp:  [97 F (36.1 C)-98.5 F (36.9 C)] 98.5 F (36.9 C) (11/21 0523) Pulse Rate:  [66-81] 77 (11/21 0523) Resp:  [12-20] 20 (11/21 0523) BP: (102-146)/(54-71) 111/54 mmHg (11/21 0523) SpO2:  [98 %-100 %] 98 % (11/21 0523) Weight:  [151 lb (68.493 kg)] 151 lb (68.493 kg) (11/20 1550) Last BM Date: 10/12/13  Intake/Output from previous day: 11/20 0701 - 11/21 0700 In: 5067.5 [I.V.:5067.5] Out: 2665 [Urine:2300; Drains:115; Blood:250] Intake/Output this shift: Total I/O In: -  Out: 30 [Drains:30]  Resp: clear to auscultation bilaterally Chest wall: skin flaps look healthy. drain output serosanguinous Cardio: regular rate and rhythm  Lab Results:   Recent Labs  10/12/13 0940  WBC 6.1  HGB 14.4  HCT 41.4  PLT 255   BMET  Recent Labs  10/12/13 0940  NA 138  K 4.3  CL 102  CO2 27  GLUCOSE 96  BUN 18  CREATININE 0.91  CALCIUM 9.7   PT/INR No results found for this basename: LABPROT, INR,  in the last 72 hours ABG No results found for this basename: PHART, PCO2, PO2, HCO3,  in the last 72 hours  Studies/Results: Nm Sentinel Node Inj-no Rpt (breast)  10/13/2013   CLINICAL DATA: right breast cancer   Sulfur colloid was injected intradermally by the nuclear medicine  technologist for breast cancer sentinel node localization.    Dg Chest Port 1 View  10/13/2013   CLINICAL DATA:  Port placement  EXAM: PORTABLE CHEST - 1 VIEW  COMPARISON:  Portable exam 1350 hr compared to 10/12/2013  FINDINGS: Left subclavian power port with tip projecting over mid SVC.  Surgical drains and tissues expanders at the anterior chest bilaterally.  Surgical clips left axilla.  Upper normal heart size.  Normal mediastinal contours and pulmonary vascularity.  Lungs clear.  No pleural effusion or pneumothorax.  IMPRESSION: No pneumothorax following left subclavian Port-A-Cath insertion.   Postoperative changes with clear lungs.   Electronically Signed   By: Ulyses Southward M.D.   On: 10/13/2013 14:19   Dg Fluoro Guide Cv Line-no Report  10/13/2013   CLINICAL DATA: BREAST CANCER   FLOURO GUIDE CV LINE  Fluoroscopy was utilized by the requesting physician.  No radiographic  interpretation.     Anti-infectives: Anti-infectives   Start     Dose/Rate Route Frequency Ordered Stop   10/13/13 1800  ceFAZolin (ANCEF) IVPB 1 g/50 mL premix     1 g 100 mL/hr over 30 Minutes Intravenous 4 times per day 10/13/13 1540     10/13/13 1057  polymyxin B 500,000 Units, bacitracin 50,000 Units in sodium chloride irrigation 0.9 % 500 mL irrigation  Status:  Discontinued       As needed 10/13/13 1057 10/13/13 1336   10/13/13 0600  ceFAZolin (ANCEF) IVPB 2 g/50 mL premix     2 g 100 mL/hr over 30 Minutes Intravenous On call to O.R. 10/12/13 1418 10/13/13 1154   10/13/13 0536  ceFAZolin (ANCEF) IVPB 2 g/50 mL premix  Status:  Discontinued     2 g 100 mL/hr over 30 Minutes Intravenous On call to O.R. 10/13/13 0536 10/13/13 1536      Assessment/Plan: s/p Procedure(s):  BILATERAL MASTECTOMY WITH RIGHT SENTINEL LYMPH NODE BIOPSY (Bilateral) INSERTION PORT-A-CATH (Left) PLACEMENT OF BILATERAL TISSUE EXPANDER FOR BREAST RECONSTRUCTION  (Bilateral) Discharge  LOS: 1 day    TOTH III,PAUL S 10/14/2013

## 2013-10-14 NOTE — Progress Notes (Signed)
UR completed 

## 2013-10-14 NOTE — Discharge Summary (Signed)
Physician Discharge Summary  Patient ID: Barbara [name] MRN: [id number] DOB/AGE: [birth year] 62 y.o.  Admit date: 10/13/2013 Discharge date: 10/14/2013  Admission Diagnoses:  Discharge Diagnoses:  Active Problems:   * No active hospital problems. *   Discharged Condition: good  Hospital Course: the pt underwent bilateral mastectomies and right sentinel node mapping with reconstruction. She tolerated the surgery well. On pod 1 she is ready for discharge home  Consults: None  Significant Diagnostic Studies: none  Treatments: surgery: as above  Discharge Exam: Blood pressure 111/54, pulse 77, temperature 98.5 F (36.9 C), temperature source Oral, resp. rate 20, height 5' 6.5" (1.689 m), weight 151 lb (68.493 kg), SpO2 98.00%. Chest wall: skin flaps look healthy  Disposition: Final discharge disposition not confirmed  Discharge Orders   Future Appointments Provider Department Dept Phone   10/25/2013 9:30 AM [facility] [phone]   11/01/2013 2:30 PM Beverely Pace [facility] [phone]   11/01/2013 3:00 PM Lowella Dell, MD [facility] MEDICAL ONCOLOGY [phone]   03/23/2014 11:30 AM Annamaria Boots, MD [facility] [phone]   Future Orders Complete By Expires   Call MD for:  difficulty breathing, headache or visual disturbances  As directed    Call MD for:  extreme fatigue  As directed    Call MD for:  hives  As directed    Call MD for:  persistant dizziness or light-headedness  As directed    Call MD for:  persistant nausea and vomiting  As directed    Call MD for:  redness, tenderness, or signs of infection (pain, swelling, redness, odor or green/yellow discharge around incision site)  As directed    Call MD for:  severe uncontrolled pain  As directed    Call MD for:  temperature >100.4  As directed    Diet - low sodium heart healthy  As  directed    Discharge instructions  As directed    Comments:     Sponge bathe until drain is removed. No overhead activity. Empty and record output and recharge drains twice a day   Increase activity slowly  As directed        Medication List         aspirin 81 MG tablet  Take 81 mg by mouth daily.     atorvastatin 10 MG tablet  Commonly known as:  LIPITOR  Take 10 mg by mouth at bedtime.     D3-1000 1000 UNITS tablet  Generic drug:  Cholecalciferol  Take 1,000 Units by mouth daily.     levothyroxine 50 MCG tablet  Commonly known as:  SYNTHROID, LEVOTHROID  Take 50 mcg by mouth daily before breakfast. Take 50 mcg 1 hours before breakfast.     oxyCODONE-acetaminophen 5-325 MG per tablet  Commonly known as:  ROXICET  Take 1-2 tablets by mouth every 4 (four) hours as needed for severe pain.     PARoxetine 20 MG tablet  Commonly known as:  PAXIL  Take 20 mg by mouth at bedtime.     VIACTIV 500-500-40 MG-UNT-MCG Chew  Generic drug:  Calcium-Vitamin D-Vitamin K  Chew 1 tablet by mouth.           Follow-up Information   Follow up with Robyne Askew, MD In 2 weeks.   Specialty:  General Surgery   Contact information:   [address] [place] [address]  [phone]       Follow up with Rossie Muskrat, MD In 1 week.   Specialty:  Plastic Surgery   Contact information:   [address] [place] [address] [phone]       Signed: Robyne Askew 10/14/2013, 11:21 AM

## 2013-10-17 ENCOUNTER — Telehealth: Payer: Self-pay | Admitting: Genetic Counselor

## 2013-10-17 NOTE — Telephone Encounter (Signed)
Revealed negative genetic test results but that she has an ATM VUS

## 2013-10-18 ENCOUNTER — Encounter: Payer: Self-pay | Admitting: Genetic Counselor

## 2013-10-19 ENCOUNTER — Encounter: Payer: Self-pay | Admitting: Genetic Counselor

## 2013-10-25 ENCOUNTER — Encounter: Payer: Self-pay | Admitting: *Deleted

## 2013-10-25 ENCOUNTER — Other Ambulatory Visit: Payer: BC Managed Care – PPO

## 2013-10-31 ENCOUNTER — Encounter: Payer: Self-pay | Admitting: Genetic Counselor

## 2013-10-31 ENCOUNTER — Other Ambulatory Visit (HOSPITAL_COMMUNITY): Payer: BC Managed Care – PPO

## 2013-11-01 ENCOUNTER — Other Ambulatory Visit (HOSPITAL_BASED_OUTPATIENT_CLINIC_OR_DEPARTMENT_OTHER): Payer: BC Managed Care – PPO | Admitting: Lab

## 2013-11-01 ENCOUNTER — Ambulatory Visit (HOSPITAL_BASED_OUTPATIENT_CLINIC_OR_DEPARTMENT_OTHER): Payer: BC Managed Care – PPO | Admitting: Oncology

## 2013-11-01 VITALS — BP 127/74 | HR 76 | Temp 98.3°F | Resp 18 | Ht 66.0 in | Wt 156.4 lb

## 2013-11-01 DIAGNOSIS — Z170 Estrogen receptor positive status [ER+]: Secondary | ICD-10-CM

## 2013-11-01 DIAGNOSIS — C50919 Malignant neoplasm of unspecified site of unspecified female breast: Secondary | ICD-10-CM

## 2013-11-01 DIAGNOSIS — C50419 Malignant neoplasm of upper-outer quadrant of unspecified female breast: Secondary | ICD-10-CM

## 2013-11-01 DIAGNOSIS — C50411 Malignant neoplasm of upper-outer quadrant of right female breast: Secondary | ICD-10-CM

## 2013-11-01 DIAGNOSIS — Z9010 Acquired absence of unspecified breast and nipple: Secondary | ICD-10-CM

## 2013-11-01 LAB — CBC WITH DIFFERENTIAL/PLATELET
BASO%: 0.6 % (ref 0.0–2.0)
Basophils Absolute: 0.1 10*3/uL (ref 0.0–0.1)
EOS%: 3.7 % (ref 0.0–7.0)
LYMPH%: 32.5 % (ref 14.0–49.7)
MCH: 30.4 pg (ref 25.1–34.0)
MCV: 89.6 fL (ref 79.5–101.0)
MONO%: 6.9 % (ref 0.0–14.0)
RBC: 4.31 10*6/uL (ref 3.70–5.45)
RDW: 12.6 % (ref 11.2–14.5)
nRBC: 0 % (ref 0–0)

## 2013-11-01 MED ORDER — LIDOCAINE-PRILOCAINE 2.5-2.5 % EX CREA: 1.0000 "application " | TOPICAL_CREAM | CUTANEOUS | Status: DC | PRN

## 2013-11-01 NOTE — Progress Notes (Signed)
ID: Barbara [name] OB: 11/15/1951  MR#: [id number]  YQM#:[id number]  PCP: Astrid Divine, MD GYN:  Lum Keas SU: [name] OTHER BM:WUXLKG [place] Scrape  CHIEF COMPLAINT: "I have been found to have breast cancer".  HISTORY OF PRESENT ILLNESS: The patient has a very strong family history for breast cancer, although she has teste  negative for the BRCA mutations (as have 2 of her sisters). She had been receiving breast MRI in addition to mammography, but had not had this test performed since October of 2010. More recently, 07/22/2013, routine screening mammography with tomography at [facility] showed no worrisome findings in the setting of extremely dense breasts.  MRI of the breast 09/08/2013, however, showed in the right upper outer quadrant an irregular mass measuring 1.7 cm. This was new as compared to the prior MRI from 2010. There were no abnormal lymph nodes and no other areas of concern in either breast. 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 The MIB-1 was 25%.  The patient's subsequent history is as detailed below  .INTERVAL HISTORY:  Barbara [name] returns today for followup of her breast cancer accompanied by her husband Barbara [name]. Since her last visit here she had her definitive surgery, which by her choice was bilateral mastectomies. The pathology report ([id number]) showed the left breast to be benign. In the right breast there was an invasive ductal carcinoma measuring 1.8 cm, grade 2, associated with high-grade ductal carcinoma in situ. Margins were ample. Both sentinel lymph nodes on the right were clear. The prognostic panel was not repeated.Marland Kitchen  REVIEW OF SYSTEMS: She did well with her surgery, without significant problems with pain, swelling, redness, or fever. She is continuing  reconstruction with Dr. Odis Luster, but is not sure when final implant placement will take place. Otherwise a detailed review of systems today was entirely noncontributory.  PAST MEDICAL HISTORY: Past Medical History  Diagnosis Date  . Breast cancer   . Hashimoto's thyroiditis     with goiter  . Hypercholesteremia   . 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  Narrowing of the 4th and 5th lumbar interspaces.   Marland Kitchen Spina bifida     occutta L5  . Anxiety   . GERD (gastroesophageal reflux disease)   . Headache(784.0)     hx migraines    PAST SURGICAL HISTORY: Past Surgical History  Procedure Laterality Date  . Hernia repair    . Tubal ligation    . Cystoscopy with urethral caruncle    . Mastectomy w/ sentinel node biopsy Bilateral 10/13/2013    Procedure:  BILATERAL MASTECTOMY WITH RIGHT SENTINEL LYMPH NODE BIOPSY;  Surgeon: Robyne Askew, MD;  Location: [facility] OR;  Service: General;  Laterality: Bilateral;  . Portacath placement Left 10/13/2013    Procedure: INSERTION PORT-A-CATH;  Surgeon: Robyne Askew, MD;  Location: [facility] OR;  Service: General;  Laterality: Left;  . Breast reconstruction with placement of tissue expander and flex hd (acellular hydrated dermis) Bilateral 10/13/2013    Procedure: PLACEMENT OF BILATERAL TISSUE EXPANDER FOR BREAST RECONSTRUCTION ;  Surgeon: Etter Sjogren, MD;  Location: [facility] OR;  Service: Plastics;  Laterality: Bilateral;    FAMILY HISTORY Family History  Problem Relation Age of Onset  . Breast cancer Mother 25  . Lung  cancer Father   . Breast cancer Sister 83    Bilateral breast cancer with 2nd dx at 48  . Breast cancer Maternal Aunt 52  . Breast cancer Sister 21  . Lung cancer Maternal Uncle   . Leukemia Paternal Uncle   . Melanoma Brother 52    on ear  . Prostate cancer Brother 21  . Cancer Cousin     maternal cousin with cancer - NOS  The patient's mother was diagnosed with breast cancer the age  of 46. She died at the age of 68. The patient's father died at the age of 24, with a history of lung cancer. The patient had 2 brothers, and 2 sisters. The patient's sister Barbara [name] has a history of bilateral breast cancer in her sister Barbara [name] a history of left breast cancer. Both have been tested and are found to be negative for a BRCA one or 2 mutation. The patient's mother's sister also had a history of breast cancer. The patient herself has been tested for the BRCA mutation as well as through [facility]. No mutation has been file.   GYNECOLOGIC HISTORY:  Menarche age 55, first live birth age 34. The patient is GX P2. She stopped having periods in her early 2s. She did not take hormone replacement.  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 Her husband Barbara [name] used to work for [employer] an old. Daughter Barbara [name] works as a Firefighter in [place]. Son Barbara [name] is a drug representative for [employer] (diabetes). His wife, and December, is a Geneticist, molecular in various [facility] emergency room's. The patient has 5 grandchildren. She at tends [facility]. [facility]    ADVANCED DIRECTIVES:  In place   HEALTH MAINTENANCE: History  Substance Use Topics  . Smoking status: Never Smoker   . Smokeless tobacco: Never Used  . Alcohol Use: Yes     Comment: rarely     Colonoscopy: 2013/ [facility]  PAP: 2013  Bone density: 2013/osteopenia  Lipid panel:  Allergies  Allergen Reactions  . Buprenex [Buprenorphine] Nausea And Vomiting and Other (See Comments)    Oversedation  . Simvastatin Other (See Comments)    Myalgias   . Codeine Hives, Itching and Rash    Current Outpatient Prescriptions  Medication Sig Dispense Refill  . aspirin 81 MG tablet Take 81 mg by mouth daily.      Marland Kitchen atorvastatin (LIPITOR) 10 MG tablet Take 10 mg by mouth at bedtime.       . Calcium-Vitamin D-Vitamin K (VIACTIV) 500-500-40 MG-UNT-MCG CHEW Chew 1 tablet by mouth.        . Cholecalciferol (D3-1000) 1000 UNITS tablet Take 1,000 Units by mouth daily.      Marland Kitchen levothyroxine (SYNTHROID, LEVOTHROID) 50 MCG tablet Take 50 mcg by mouth daily before breakfast. Take 50 mcg 1 hours before breakfast.      . oxyCODONE-acetaminophen (ROXICET) 5-325 MG per tablet Take 1-2 tablets by mouth every 4 (four) hours as needed for severe pain.  50 tablet  0  . PARoxetine (PAXIL) 20 MG tablet Take 20 mg by mouth at bedtime.        No current facility-administered medications for this visit.    OBJECTIVE:  Middle-aged white woman who appears stated age 31 Vitals:   11/01/13 1448  BP: 127/74  Pulse: 76  Temp: 98.3 F (36.8 C)  Resp: 18     Body mass index is 25.25 kg/(m^2).    ECOG  FS:1 - Symptomatic but completely ambulatory  Ocular: Sclerae unicteric, pupils equal and round Ear-nose-throat: Oropharynx clear and moist Lymphatic: No cervical or supraclavicular adenopathy Lungs no rales or rhonchi, good excursion bilaterally Heart regular rate and rhythm, no murmur appreciated Abd soft, nontender, positive bowel sounds MSK no focal spinal tenderness, no joint edema Neuro: non-focal, well-oriented, pleasant affect Breasts:  Status post bilateral mastectomies with expander is in place. On the right there is no evidence of residual disease. The right axilla is benign. Left side is unremarkable.Marland Kitchen   LAB RESULTS:  CMP     Component Value Date/Time   NA 138 10/12/2013 0940   NA 139 09/28/2013 0841   K 4.3 10/12/2013 0940   K 4.2 09/28/2013 0841   CL 102 10/12/2013 0940   CO2 27 10/12/2013 0940   CO2 25 09/28/2013 0841   GLUCOSE 96 10/12/2013 0940   GLUCOSE 85 09/28/2013 0841   BUN 18 10/12/2013 0940   BUN 23.2 09/28/2013 0841   CREATININE 0.91 10/12/2013 0940   CREATININE 0.9 09/28/2013 0841   CALCIUM 9.7 10/12/2013 0940   CALCIUM 9.7 09/28/2013 0841   PROT 7.4 09/28/2013 0841   ALBUMIN 3.8 09/28/2013 0841   AST 16 09/28/2013 0841   ALT 15 09/28/2013 0841   ALKPHOS 78  09/28/2013 0841   BILITOT 0.45 09/28/2013 0841   GFRNONAA 66* 10/12/2013 0940   GFRAA 76* 10/12/2013 0940    I No results found for this basename: SPEP,  UPEP,   kappa and lambda light chains    Lab Results  Component Value Date   WBC 8.7 11/01/2013   NEUTROABS 4.9 11/01/2013   HGB 13.1 11/01/2013   HCT 38.6 11/01/2013   MCV 89.6 11/01/2013   PLT 342 11/01/2013      Chemistry      Component Value Date/Time   NA 138 10/12/2013 0940   NA 139 09/28/2013 0841   K 4.3 10/12/2013 0940   K 4.2 09/28/2013 0841   CL 102 10/12/2013 0940   CO2 27 10/12/2013 0940   CO2 25 09/28/2013 0841   BUN 18 10/12/2013 0940   BUN 23.2 09/28/2013 0841   CREATININE 0.91 10/12/2013 0940   CREATININE 0.9 09/28/2013 0841      Component Value Date/Time   CALCIUM 9.7 10/12/2013 0940   CALCIUM 9.7 09/28/2013 0841   ALKPHOS 78 09/28/2013 0841   AST 16 09/28/2013 0841   ALT 15 09/28/2013 0841   BILITOT 0.45 09/28/2013 0841       No results found for this basename: LABCA2    No components found with this basename: LABCA125    No results found for this basename: INR,  in the last 168 hours  Urinalysis No results found for this basename: colorurine,  appearanceur,  labspec,  phurine,  glucoseu,  hgbur,  bilirubinur,  ketonesur,  proteinur,  urobilinogen,  nitrite,  leukocytesur    STUDIES: Mr Breast Bilateral W Wo Contrast  09/08/2013   CLINICAL DATA:  Strong family history of breast cancer in her mother age 83 and 2 sisters at age 53 and age 57. No current problems  EXAM: MR BILATERAL BREAST WITHOUT AND WITH CONT RAST  LABS:  BUN and creatinine were obtained on site at [facility] at [address] Results: BUN 25 mg/dL, Creatinine 1.1 mg/dL, GFR 50  TECHNIQUE: Multiplanar, multisequence MR images of both breasts were obtained prior to and following the intravenous administration of 14ml of MultiHance.  THREE-DIMENSIONAL MR IMAGE RENDERING ON  INDEPENDENT WORKSTATION:  Three-dimensional MR images  were rendered by post-processing of the original MR data on an independent workstation. The three-dimensional MR images were interpreted, and findings are reported in the following complete MRI report for this study.  COMPARISON:  Previous exams  FINDINGS: Breast composition: c. Heterogeneous fibroglandular tissue  Background parenchymal enhancement: Mild  Right breast: In the deep posterior aspect of the right upper outer quadrant there is an irregular mass which demonstrates heterogeneous enhancement measuring 8 x 8 by 17 mm. An area of associated linear enhancement extends from the anterior inferior portion of this mass. This is new since the previous MRI of 2010 and would need to be viewed with suspicion. Several stable scattered small foci are identified. No other worrisome areas of focal enhancement are identified  Left breast: No new areas of abnormal enhancement or focal masses are seen. A small area of enhancement in the deep central aspect of the left breast and several scattered foci have been present since 2008 and would correlate with benign findings  Lymph nodes: No abnormal appearing lymph nodes.  Ancillary findings:  None.  IMPRESSION: New irregular enhancing mass in the deep central upper outer portion of the right breast is a suspicious finding. No signs of malignancy are identified in the left breast.  RECOMMENDATION: Ultrasound evaluation of the right breast is recommended to evaluate for a sonographic correlate to the MRI finding. If no correlate is identified by ultrasound, then MRI guided biopsy of the new right upper outer quadrant mass would be recommended  BI-RADS CATEGORY  0: Incomplete. Need additional imaging evaluation and/or prior mammograms for comparison.   Electronically Signed   By: Leda Gauze M.D.   On: 09/08/2013 15:45    ASSESSMENT: 62 y.o. BRCA negative [place] woman, status post right breast biopsy 09/21/2013 for a clinical T1c N0, stage IA invasive ductal carcinoma,  grade 2, triple positive, with a HER-2: CED 17 ratio of 5.55 and an average copy number per cell of  13.05. The MIB-1-1 was 25%  (1) the patient participated in the Star study remotely, completing 5 years of tamoxifen in the late 1990s  (2) status post bilateral mastectomies 10/13/2013 showing:  (a) on the left, atypical ductal hyperplasia  (b) on the right, a pT1c pN0, stage IA invasive ductal carcinoma, grade 2  PLAN: Allix did well with her surgery and has a port in place. Today we discussed chemotherapy options. While the combination of carboplatin and docetaxel has long been used together with trastuzumab in cases like hers, more recent data suggests using paclitaxel alone with the anti-HER-2 therapy also produces excellent results. This  has significantly fewer side effects. It is the option we are choosing. This is in accordance with NCCN guidelines.   We then discussed the possible toxicities, side effects and complications of Abraxane as well as the trastuzumab and pertuzumab immunotherapy. The plan will be for 4 cycles of Abraxane (12 doses), given days 1 to my 8, and 15 of each cycle. Lorilyn will need an echo before starting. We are going to wait for clearance from Dr. Odis Luster, but I would think by late January she will be ready to start and accordingly she will see me again January 23, with treatment that day, again assuming she gets surgical clearance.  Violanda has a good understanding of the overall plan, and agrees with it. She knows the goal of treatment is cure. She has an excellent prognosis. She will call with any problems that may develop  before her next visit here.  Lowella Dell, MD   11/01/2013 3:17 PM

## 2013-11-02 ENCOUNTER — Telehealth: Payer: Self-pay | Admitting: *Deleted

## 2013-11-02 ENCOUNTER — Encounter: Payer: Self-pay | Admitting: Oncology

## 2013-11-02 NOTE — Telephone Encounter (Signed)
Per staff message and POF I have scheduled appts.  JMW  

## 2013-11-02 NOTE — Telephone Encounter (Signed)
Lm gv appt for chemo edu on 12/06/13@ 12 noon. i also gv appt for 12/16/13 w/ labs@ 8:30am, ov@ 9am, and tx to follow. i emailed MW to add the tx. Pt is aware that im waiting on echo pre cert and will call her back w/ an appt. gv order to [place]...td

## 2013-11-02 NOTE — Progress Notes (Signed)
[employer] approved Echo [place] # [id number]

## 2013-11-03 ENCOUNTER — Telehealth: Payer: Self-pay | Admitting: *Deleted

## 2013-11-03 NOTE — Telephone Encounter (Signed)
sw pt gv echo appt for 12/06/13 @ 11am. Pt made me aware that she already had a chemo edu class on 12/2. Therefore chemo edu was cancel for 12/06/13...td

## 2013-11-04 ENCOUNTER — Other Ambulatory Visit: Payer: Self-pay | Admitting: *Deleted

## 2013-11-04 MED ORDER — ONDANSETRON HCL 8 MG PO TABS: 8.0000 mg | ORAL_TABLET | Freq: Two times a day (BID) | ORAL | Status: DC

## 2013-11-04 MED ORDER — DEXAMETHASONE 4 MG PO TABS: 8.0000 mg | ORAL_TABLET | Freq: Two times a day (BID) | ORAL | Status: DC

## 2013-11-04 MED ORDER — PROCHLORPERAZINE MALEATE 10 MG PO TABS: 10.0000 mg | ORAL_TABLET | Freq: Four times a day (QID) | ORAL | Status: DC | PRN

## 2013-11-04 MED ORDER — LORAZEPAM 0.5 MG PO TABS: 0.5000 mg | ORAL_TABLET | Freq: Four times a day (QID) | ORAL | Status: DC | PRN

## 2013-11-07 ENCOUNTER — Ambulatory Visit: Payer: BC Managed Care – PPO | Admitting: Oncology

## 2013-11-08 ENCOUNTER — Encounter (INDEPENDENT_AMBULATORY_CARE_PROVIDER_SITE_OTHER): Payer: Self-pay | Admitting: General Surgery

## 2013-11-08 ENCOUNTER — Ambulatory Visit (INDEPENDENT_AMBULATORY_CARE_PROVIDER_SITE_OTHER): Payer: BC Managed Care – PPO | Admitting: General Surgery

## 2013-11-08 ENCOUNTER — Encounter (INDEPENDENT_AMBULATORY_CARE_PROVIDER_SITE_OTHER): Payer: BC Managed Care – PPO | Admitting: Surgery

## 2013-11-08 ENCOUNTER — Encounter: Payer: Self-pay | Admitting: Dietician

## 2013-11-08 VITALS — BP 122/80 | HR 68 | Temp 97.4°F | Resp 16 | Ht 66.5 in | Wt 153.0 lb

## 2013-11-08 DIAGNOSIS — C50419 Malignant neoplasm of upper-outer quadrant of unspecified female breast: Secondary | ICD-10-CM

## 2013-11-08 DIAGNOSIS — C50411 Malignant neoplasm of upper-outer quadrant of right female breast: Secondary | ICD-10-CM

## 2013-11-08 NOTE — Progress Notes (Signed)
Subjective:     Patient ID: Barbara [name], female   DOB: 04/15/1951, 62 y.o.   MRN: [id number]  HPI The patient is a 62 year old white female who is almost one month status post bilateral mastectomies and right sentinel node biopsy for a T1 C. N0 right breast cancer. She was a triple negative. She had reconstruction done by Dr. Odis Luster. She has no complaints today.  Review of Systems     Objective:   Physical Exam On exam both mastectomy incisions are healing nicely with no sign of infection. Her skin flaps are healthy. Her tissue expanders seemed to be intact. There is no palpable mass in either reconstructed breast.    Assessment:     The patient is one month status post bilateral mastectomies with reconstruction for right breast cancer     Plan:     At this point she will continue to follow up with Dr. Odis Luster and her medical oncologist. She will continue to do regular self exams. I will plan to see her back in 3 months.

## 2013-11-08 NOTE — Progress Notes (Signed)
Breast Cancer Nutrition Class Attendance Note  Date: 11/08/2013  Time: 1800  Pt attended  [facility]'s Breast Cancer Nutrition Class, "Food For Your Fight". Pt was educated on basic cancer nutrition principles, including plant based diet and principles from AICR  (American Institute for Cancer Research) about latest nutrition findings and recommendations. Questions answered. Handouts and recipes provided.   Arling Cerone A. Behr Cislo, RD, LDN Pager: [phone]  

## 2013-11-09 ENCOUNTER — Ambulatory Visit: Payer: BC Managed Care – PPO | Admitting: Oncology

## 2013-11-09 ENCOUNTER — Other Ambulatory Visit: Payer: BC Managed Care – PPO

## 2013-12-06 ENCOUNTER — Ambulatory Visit (HOSPITAL_COMMUNITY)
Admission: RE | Admit: 2013-12-06 | Discharge: 2013-12-06 | Disposition: A | Discharge status: Home / Self Care | Payer: BC Managed Care – PPO | Source: Ambulatory Visit | Attending: Oncology | Admitting: Oncology

## 2013-12-06 ENCOUNTER — Other Ambulatory Visit: Payer: BC Managed Care – PPO

## 2013-12-06 DIAGNOSIS — Z0189 Encounter for other specified special examinations: Secondary | ICD-10-CM

## 2013-12-06 DIAGNOSIS — Z01818 Encounter for other preprocedural examination: Secondary | ICD-10-CM | POA: Insufficient documentation

## 2013-12-06 DIAGNOSIS — C50919 Malignant neoplasm of unspecified site of unspecified female breast: Secondary | ICD-10-CM | POA: Insufficient documentation

## 2013-12-06 DIAGNOSIS — C50411 Malignant neoplasm of upper-outer quadrant of right female breast: Secondary | ICD-10-CM

## 2013-12-06 NOTE — Progress Notes (Signed)
  Echocardiogram 2D Echocardiogram has been performed.  Basilia Jumbo 12/06/2013, 11:32 AM

## 2013-12-15 ENCOUNTER — Other Ambulatory Visit: Payer: Self-pay | Admitting: *Deleted

## 2013-12-15 DIAGNOSIS — C50411 Malignant neoplasm of upper-outer quadrant of right female breast: Secondary | ICD-10-CM

## 2013-12-16 ENCOUNTER — Ambulatory Visit (HOSPITAL_BASED_OUTPATIENT_CLINIC_OR_DEPARTMENT_OTHER): Payer: BC Managed Care – PPO

## 2013-12-16 ENCOUNTER — Other Ambulatory Visit (HOSPITAL_BASED_OUTPATIENT_CLINIC_OR_DEPARTMENT_OTHER): Payer: BC Managed Care – PPO

## 2013-12-16 ENCOUNTER — Ambulatory Visit (HOSPITAL_BASED_OUTPATIENT_CLINIC_OR_DEPARTMENT_OTHER): Payer: BC Managed Care – PPO | Admitting: Oncology

## 2013-12-16 ENCOUNTER — Telehealth: Payer: Self-pay | Admitting: *Deleted

## 2013-12-16 ENCOUNTER — Other Ambulatory Visit: Payer: Self-pay | Admitting: Oncology

## 2013-12-16 ENCOUNTER — Telehealth: Payer: Self-pay | Admitting: Oncology

## 2013-12-16 VITALS — BP 144/68 | HR 66 | Temp 98.3°F | Resp 18

## 2013-12-16 VITALS — BP 123/70 | HR 80 | Temp 98.4°F | Resp 20 | Ht 65.5 in | Wt 154.8 lb

## 2013-12-16 DIAGNOSIS — C50419 Malignant neoplasm of upper-outer quadrant of unspecified female breast: Secondary | ICD-10-CM

## 2013-12-16 DIAGNOSIS — C50411 Malignant neoplasm of upper-outer quadrant of right female breast: Secondary | ICD-10-CM

## 2013-12-16 DIAGNOSIS — Z5111 Encounter for antineoplastic chemotherapy: Secondary | ICD-10-CM

## 2013-12-16 DIAGNOSIS — Z5112 Encounter for antineoplastic immunotherapy: Secondary | ICD-10-CM

## 2013-12-16 DIAGNOSIS — Z170 Estrogen receptor positive status [ER+]: Secondary | ICD-10-CM

## 2013-12-16 LAB — COMPREHENSIVE METABOLIC PANEL (CC13)
ALBUMIN: 3.8 g/dL (ref 3.5–5.0)
ALT: 17 U/L (ref 0–55)
ANION GAP: 9 meq/L (ref 3–11)
AST: 16 U/L (ref 5–34)
Alkaline Phosphatase: 85 U/L (ref 40–150)
BUN: 20.6 mg/dL (ref 7.0–26.0)
CALCIUM: 9.4 mg/dL (ref 8.4–10.4)
CO2: 27 mEq/L (ref 22–29)
Chloride: 105 mEq/L (ref 98–109)
Creatinine: 0.8 mg/dL (ref 0.6–1.1)
Glucose: 75 mg/dl (ref 70–140)
POTASSIUM: 4.1 meq/L (ref 3.5–5.1)
Sodium: 141 mEq/L (ref 136–145)
Total Bilirubin: 0.42 mg/dL (ref 0.20–1.20)
Total Protein: 7 g/dL (ref 6.4–8.3)

## 2013-12-16 LAB — CBC WITH DIFFERENTIAL/PLATELET
BASO%: 1.2 % (ref 0.0–2.0)
BASOS ABS: 0.1 10*3/uL (ref 0.0–0.1)
EOS%: 3.8 % (ref 0.0–7.0)
Eosinophils Absolute: 0.2 10*3/uL (ref 0.0–0.5)
HEMATOCRIT: 40.3 % (ref 34.8–46.6)
HEMOGLOBIN: 13.8 g/dL (ref 11.6–15.9)
LYMPH%: 27 % (ref 14.0–49.7)
MCH: 30.9 pg (ref 25.1–34.0)
MCHC: 34.1 g/dL (ref 31.5–36.0)
MCV: 90.4 fL (ref 79.5–101.0)
MONO#: 0.4 10*3/uL (ref 0.1–0.9)
MONO%: 6.6 % (ref 0.0–14.0)
NEUT#: 4 10*3/uL (ref 1.5–6.5)
NEUT%: 61.4 % (ref 38.4–76.8)
Platelets: 255 10*3/uL (ref 145–400)
RBC: 4.46 10*6/uL (ref 3.70–5.45)
RDW: 13.2 % (ref 11.2–14.5)
WBC: 6.5 10*3/uL (ref 3.9–10.3)
lymph#: 1.8 10*3/uL (ref 0.9–3.3)

## 2013-12-16 MED ORDER — DIPHENHYDRAMINE HCL 25 MG PO CAPS
ORAL_CAPSULE | ORAL | Status: AC
  Filled 2013-12-16: qty 1

## 2013-12-16 MED ORDER — ONDANSETRON 8 MG/NS 50 ML IVPB
INTRAVENOUS | Status: AC
  Filled 2013-12-16: qty 8

## 2013-12-16 MED ORDER — SODIUM CHLORIDE 0.9 % IJ SOLN
10.0000 mL | INTRAMUSCULAR | Status: DC | PRN
  Administered 2013-12-16: 10 mL
  Filled 2013-12-16: qty 10

## 2013-12-16 MED ORDER — ACETAMINOPHEN 325 MG PO TABS
650.0000 mg | ORAL_TABLET | Freq: Once | ORAL | Status: AC
  Administered 2013-12-16: 650 mg via ORAL

## 2013-12-16 MED ORDER — SODIUM CHLORIDE 0.9 % IV SOLN
Freq: Once | INTRAVENOUS | Status: AC
  Administered 2013-12-16: 11:00:00 via INTRAVENOUS

## 2013-12-16 MED ORDER — FAMOTIDINE IN NACL 20-0.9 MG/50ML-% IV SOLN
20.0000 mg | Freq: Once | INTRAVENOUS | Status: AC
  Administered 2013-12-16: 20 mg via INTRAVENOUS

## 2013-12-16 MED ORDER — DIPHENHYDRAMINE HCL 25 MG PO CAPS
25.0000 mg | ORAL_CAPSULE | Freq: Once | ORAL | Status: AC
  Administered 2013-12-16: 25 mg via ORAL

## 2013-12-16 MED ORDER — DEXAMETHASONE SODIUM PHOSPHATE 10 MG/ML IJ SOLN
10.0000 mg | Freq: Once | INTRAMUSCULAR | Status: AC
Start: 2013-12-16 — End: 2013-12-16
  Administered 2013-12-16: 10 mg via INTRAVENOUS

## 2013-12-16 MED ORDER — ACETAMINOPHEN 325 MG PO TABS
ORAL_TABLET | ORAL | Status: AC
Start: 2013-12-16 — End: 2013-12-16
  Filled 2013-12-16: qty 2

## 2013-12-16 MED ORDER — ONDANSETRON 8 MG/50ML IVPB (CHCC)
8.0000 mg | Freq: Once | INTRAVENOUS | Status: AC
  Administered 2013-12-16: 8 mg via INTRAVENOUS

## 2013-12-16 MED ORDER — HEPARIN SOD (PORK) LOCK FLUSH 100 UNIT/ML IV SOLN
500.0000 [IU] | Freq: Once | INTRAVENOUS | Status: AC | PRN
  Administered 2013-12-16: 500 [IU]
  Filled 2013-12-16: qty 5

## 2013-12-16 MED ORDER — DEXAMETHASONE SODIUM PHOSPHATE 10 MG/ML IJ SOLN
INTRAMUSCULAR | Status: AC
  Filled 2013-12-16: qty 1

## 2013-12-16 MED ORDER — DIPHENHYDRAMINE HCL 50 MG/ML IJ SOLN
INTRAMUSCULAR | Status: AC
  Filled 2013-12-16: qty 1

## 2013-12-16 MED ORDER — TRASTUZUMAB CHEMO INJECTION 440 MG
6.0000 mg/kg | Freq: Once | INTRAVENOUS | Status: AC
  Administered 2013-12-16: 420 mg via INTRAVENOUS
  Filled 2013-12-16: qty 20

## 2013-12-16 MED ORDER — DIPHENHYDRAMINE HCL 50 MG/ML IJ SOLN
25.0000 mg | Freq: Once | INTRAMUSCULAR | Status: AC
  Administered 2013-12-16: 25 mg via INTRAVENOUS

## 2013-12-16 MED ORDER — FAMOTIDINE IN NACL 20-0.9 MG/50ML-% IV SOLN
INTRAVENOUS | Status: AC
  Filled 2013-12-16: qty 50

## 2013-12-16 MED ORDER — SODIUM CHLORIDE 0.9 % IV SOLN
80.0000 mg/m2 | Freq: Once | INTRAVENOUS | Status: AC
  Administered 2013-12-16: 144 mg via INTRAVENOUS
  Filled 2013-12-16: qty 24

## 2013-12-16 NOTE — Telephone Encounter (Signed)
, °

## 2013-12-16 NOTE — Patient Instructions (Signed)
[facility] [facility] Discharge Instructions for Patients Receiving Chemotherapy  Today you received the following chemotherapy agents Taxol, Herceptin  To help prevent nausea and vomiting after your treatment, we encourage you to take your nausea medication as needed   If you develop nausea and vomiting that is not controlled by your nausea medication, call the clinic.   BELOW ARE SYMPTOMS THAT SHOULD BE REPORTED IMMEDIATELY:  *FEVER GREATER THAN 100.5 F  *CHILLS WITH OR WITHOUT FEVER  NAUSEA AND VOMITING THAT IS NOT CONTROLLED WITH YOUR NAUSEA MEDICATION  *UNUSUAL SHORTNESS OF BREATH  *UNUSUAL BRUISING OR BLEEDING  TENDERNESS IN MOUTH AND THROAT WITH OR WITHOUT PRESENCE OF ULCERS  *URINARY PROBLEMS  *BOWEL PROBLEMS  UNUSUAL RASH Items with * indicate a potential emergency and should be followed up as soon as possible.  Feel free to call the clinic you have any questions or concerns. The clinic phone number is [phone].

## 2013-12-16 NOTE — Progress Notes (Signed)
ID: Barbara [name] OB: Sep 20, 1951  MR#: [id number]  VOH#:[id number]  PCP: [name], MD GYN:  Felipa Emory SU: Star Age OTHER IR:SWNIOE [name]  CHIEF COMPLAINT: "I have been found to have breast cancer".  HISTORY OF PRESENT ILLNESS: The patient has a very strong family history for breast cancer, although she has teste  negative for the BRCA mutations (as have 2 of her sisters). She had been receiving breast MRI in addition to mammography, but had not had this test performed since October of 2010. More recently, 07/22/2013, routine screening mammography with tomography at [facility] showed no worrisome findings in the setting of extremely dense breasts.  MRI of the breast 09/08/2013, however, showed in the right upper outer quadrant an irregular mass measuring 1.7 cm. This was new as compared to the prior MRI from 2010. There were no abnormal lymph nodes and no other areas of concern in either breast. 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 The MIB-1 was 25%.  The patient's subsequent history is as detailed below  INTERVAL HISTORY:  Barbara [name] returns today for followup of her breast cancer accompanied by her husband Barbara [name]. she has not yet had the final implants placed, which will be saline, and which will be done after the completion of chemotherapy. However she has had a slight expansion as she wants to have. Accordingly she is ready to start her chemotherapy treatment at this point.  REVIEW OF SYSTEMS: She is back to exercising mostly by walking and gardening. She doesn't have any pain or other complications related to the reconstruction. A detailed review of systems today was entirely negative.  PAST MEDICAL HISTORY: Past Medical History  Diagnosis Date  . Breast cancer   .  Hashimoto's thyroiditis     with goiter  . Hypercholesteremia   . 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  Narrowing of the 4th and 5th lumbar interspaces.   Marland Kitchen Spina bifida     occutta L5  . Anxiety   . GERD (gastroesophageal reflux disease)   . Headache(784.0)     hx migraines    PAST SURGICAL HISTORY: Past Surgical History  Procedure Laterality Date  . Hernia repair    . Tubal ligation    . Cystoscopy with urethral caruncle    . Mastectomy w/ sentinel node biopsy Bilateral 10/13/2013    Procedure:  BILATERAL MASTECTOMY WITH RIGHT SENTINEL LYMPH NODE BIOPSY;  Surgeon: Merrie Roof, MD;  Location: [place];  Service: General;  Laterality: Bilateral;  . Portacath placement Left 10/13/2013    Procedure: INSERTION PORT-A-CATH;  Surgeon: Merrie Roof, MD;  Location: [place];  Service: General;  Laterality: Left;  . Breast reconstruction with placement of tissue expander and flex hd (acellular hydrated dermis) Bilateral 10/13/2013    Procedure: PLACEMENT OF BILATERAL TISSUE EXPANDER FOR BREAST RECONSTRUCTION ;  Surgeon: Crissie Reese, MD;  Location: [place];  Service: Plastics;  Laterality: Bilateral;    FAMILY HISTORY Family History  Problem Relation Age of Onset  . Breast cancer Mother 58  . Lung cancer Father   . Breast cancer Sister 33    Bilateral breast cancer with 2nd dx at 9  . Breast cancer Maternal Aunt 52  . Breast cancer Sister 5  .  Lung cancer Maternal Uncle   . Leukemia Paternal Uncle   . Melanoma Brother 52    on ear  . Prostate cancer Brother 79  . Cancer Cousin     maternal cousin with cancer - NOS  The patient's mother was diagnosed with breast cancer the age of 90. She died at the age of 78. The patient's father died at the age of 35, with a history of lung cancer. The patient had 2 brothers, and 2 sisters. The patient's sister Barbara [name] has a history of bilateral breast cancer in her sister Barbara [name] a history of  left breast cancer. Both have been tested and are found to be negative for a BRCA one or 2 mutation. The patient's mother's sister also had a history of breast cancer. The patient herself has been tested for the BRCA mutation as well as through [facility]. No mutation has been file.   GYNECOLOGIC HISTORY:  Menarche age 71, first live birth age 35. The patient is GX P2. She stopped having periods in her early 73s. She did not take hormone replacement.  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 Her husband Barbara [name] used to work for [employer] an old. Daughter Barbara [name] works as a Music therapist in [place]. Son Barbara [name] is a drug representative for [employer] (diabetes). His wife, and December, is a Ship broker in [place] emergency room's. The patient has 5 grandchildren. She at tends [place]    ADVANCED DIRECTIVES:  In place   HEALTH MAINTENANCE: History  Substance Use Topics  . Smoking status: Never Smoker   . Smokeless tobacco: Never Used  . Alcohol Use: Yes     Comment: rarely     Colonoscopy: 2013/ [place]  PAP: 2013  Bone density: 2013/osteopenia  Lipid panel:  Allergies  Allergen Reactions  . Buprenex [Buprenorphine] Nausea And Vomiting and Other (See Comments)    Oversedation  . Simvastatin Other (See Comments)    Myalgias   . Codeine Hives, Itching and Rash    Current Outpatient Prescriptions  Medication Sig Dispense Refill  . aspirin 81 MG tablet Take 81 mg by mouth daily.      Marland Kitchen atorvastatin (LIPITOR) 10 MG tablet Take 10 mg by mouth at bedtime.       . Calcium-Vitamin D-Vitamin K (VIACTIV) 771-165-79 MG-UNT-MCG CHEW Chew 1 tablet by mouth.       . Cholecalciferol (D3-1000) 1000 UNITS tablet Take 1,000 Units by mouth daily.      Marland Kitchen dexamethasone (DECADRON) 4 MG tablet Take 2 tablets (8 mg total) by mouth 2 (two) times daily with a meal. Take daily starting the day after chemotherapy for 2 days.  Take with food.  30 tablet  1  . levothyroxine (SYNTHROID, LEVOTHROID) 50 MCG tablet Take 50 mcg by mouth daily before breakfast. Take 50 mcg 1 hours before breakfast.      . lidocaine-prilocaine (EMLA) cream Apply 1 application topically as needed.  30 g  0  . LORazepam (ATIVAN) 0.5 MG tablet Take 1 tablet (0.5 mg total) by mouth every 6 (six) hours as needed (Nausea or vomiting).  30 tablet  0  . methocarbamol (ROBAXIN) 500 MG tablet as needed.      . mometasone (NASONEX) 50 MCG/ACT nasal spray Place 2 sprays into the nose daily.      . Nutritional Supplements (JUICE PLUS FIBRE) LIQD Take by mouth.      . ondansetron (ZOFRAN) 8  MG tablet Take 1 tablet (8 mg total) by mouth 2 (two) times daily. Take two times a day starting the day after chemo for 2 days. Then take two times a day as needed for nausea or vomiting.  30 tablet  1  . PARoxetine (PAXIL) 20 MG tablet Take 20 mg by mouth at bedtime.       . prochlorperazine (COMPAZINE) 10 MG tablet Take 1 tablet (10 mg total) by mouth every 6 (six) hours as needed (Nausea or vomiting).  30 tablet  1   No current facility-administered medications for this visit.    OBJECTIVE:  Middle-aged white woman in no acute distress Filed Vitals:   12/16/13 0818  BP: 123/70  Pulse: 80  Temp: 98.4 F (36.9 C)  Resp: 20     Body mass index is 25.36 kg/(m^2).    ECOG FS:0 - Asymptomatic  Ocular: Sclerae unicteric, pupils equal and reactive Ear-nose-throat: Oropharynx clear and moist, good dentition Lymphatic: No cervical or supraclavicular adenopathy Lungs no rales or rhonchi, good excursion bilaterally Heart regular rate and rhythm, no murmur appreciated Abd soft, nontender, positive bowel sounds MSK no focal spinal tenderness, no joint edema Neuro: non-focal, well-oriented, pleasant affect Breasts:  Status post bilateral mastectomies with expander is in place. There is no evidence of disease recurrence. The cosmetic result is good. The right axilla is  benign. Left side is unremarkable.Marland Kitchen   LAB RESULTS:  CMP     Component Value Date/Time   NA 141 12/16/2013 0808   NA 138 10/12/2013 0940   K 4.1 12/16/2013 0808   K 4.3 10/12/2013 0940   CL 102 10/12/2013 0940   CO2 27 12/16/2013 0808   CO2 27 10/12/2013 0940   GLUCOSE 75 12/16/2013 0808   GLUCOSE 96 10/12/2013 0940   BUN 20.6 12/16/2013 0808   BUN 18 10/12/2013 0940   CREATININE 0.8 12/16/2013 0808   CREATININE 0.91 10/12/2013 0940   CALCIUM 9.4 12/16/2013 0808   CALCIUM 9.7 10/12/2013 0940   PROT 7.0 12/16/2013 0808   ALBUMIN 3.8 12/16/2013 0808   AST 16 12/16/2013 0808   ALT 17 12/16/2013 0808   ALKPHOS 85 12/16/2013 0808   BILITOT 0.42 12/16/2013 0808   GFRNONAA 66* 10/12/2013 0940   GFRAA 76* 10/12/2013 0940    I No results found for this basename: SPEP,  UPEP,   kappa and lambda light chains    Lab Results  Component Value Date   WBC 6.5 12/16/2013   NEUTROABS 4.0 12/16/2013   HGB 13.8 12/16/2013   HCT 40.3 12/16/2013   MCV 90.4 12/16/2013   PLT 255 12/16/2013      Chemistry      Component Value Date/Time   NA 141 12/16/2013 0808   NA 138 10/12/2013 0940   K 4.1 12/16/2013 0808   K 4.3 10/12/2013 0940   CL 102 10/12/2013 0940   CO2 27 12/16/2013 0808   CO2 27 10/12/2013 0940   BUN 20.6 12/16/2013 0808   BUN 18 10/12/2013 0940   CREATININE 0.8 12/16/2013 0808   CREATININE 0.91 10/12/2013 0940      Component Value Date/Time   CALCIUM 9.4 12/16/2013 0808   CALCIUM 9.7 10/12/2013 0940   ALKPHOS 85 12/16/2013 0808   AST 16 12/16/2013 0808   ALT 17 12/16/2013 0808   BILITOT 0.42 12/16/2013 0808       No results found for this basename: LABCA2    No components found with this basename: [id number]  No results found for this basename: INR,  in the last 168 hours  Urinalysis No results found for this basename: colorurine,  appearanceur,  labspec,  phurine,  glucoseu,  hgbur,  bilirubinur,  ketonesur,  proteinur,  urobilinogen,  nitrite,  leukocytesur     STUDIES: Transthoracic Echocardiography  Patient: Lenaya, Pietsch MR #: [id number] Study Date: 12/06/2013 Gender: F Age: 62 Height: 167.6cm Weight: 69.4kg BSA: 1.81m2 Pt. Status: Room:  ATTENDING Arrielle Mcginn, GHazle NordmannORDERING Julieanna Geraci, GHazle NordmannREFERRING Amitai Delaughter, GJeffersonvilleGOsborne CascoSONOGRAPHER BPra[place] RDCS PERFORMING [facility], Outpatient cc:  ------------------------------------------------------------ LV EF: 55% - 60%  ------------------------------------------------------------  ASSESSMENT: 63y.o. BRCA negative  woman, status post right breast biopsy 09/21/2013 for a clinical T1c N0, stage IA invasive ductal carcinoma, grade 2, triple positive, with a HER-2: CED 17 ratio of 5.55 and an average copy number per cell of  13.05. The MIB-1-1 was 25%  (1) the patient participated in the Star study remotely, completing 5 years of tamoxifen in the late 1990s  (2) status post bilateral mastectomies 10/13/2013 showing:  (a) on the left, atypical ductal hyperplasia  (b) on the right, a pT1c pN0, stage IA invasive ductal carcinoma, grade 2  (3) starting taxol (days 1,8 and 15 of each 28 day cycle) with trastuzuman (given every 2 weeks) 12/15/2012  PLAN: There are many options that we could follow and Maanvi this case, given her very good prognosis. We could use Abraxane instead of Taxol, we could consider pertuzumab in addition to the trastuzumab. After much thought we are going to go with the standard, which is Taxol and trastuzumab. She has a very good understanding of the possible toxicities, side effects, and complications of this combination and is ready to start today.  Taneisha is ready to start her chemotherapy and anti-HER-2 treatments.  Her echocardiogram was very favorable. Today we reviewed her antinausea medicines and specifically I asked her to take the Phenergan this evening before supper, then start the ondansetron  and dexamethasone tomorrow morning, repeated twice a day for 2 days, with the Phenergan used as needed.  She's had a see me again in a week. If she has had absolutely no nausea or vomiting we will start cutting back specifically on the dexamethasone, which I think is a more trouble some of her medications. Hopefully after her a few doses of the Taxol/trastuzumab we can go with ondansetron alone.  She has some questions regarding billing (she has received no bills so far) and I referred her to one of our financial counselors for details.  She knows to call for any problems that may develop before the next visit.  MChauncey Cruel MD   12/16/2013 9:13 AM

## 2013-12-16 NOTE — Telephone Encounter (Signed)
Per staff message and POF I have scheduled appts.  JMW  

## 2013-12-19 ENCOUNTER — Telehealth: Payer: Self-pay | Admitting: Oncology

## 2013-12-19 ENCOUNTER — Telehealth: Payer: Self-pay

## 2013-12-19 NOTE — Telephone Encounter (Signed)
, °

## 2013-12-19 NOTE — Telephone Encounter (Signed)
Message copied by Baruch Merl on Mon Dec 19, 2013  3:10 PM ------      Message from: [place], [place] A      Created: Fri Dec 16, 2013 10:55 AM      Regarding: Chemo follow up call       1st Taxol, Herceptin ------

## 2013-12-19 NOTE — Telephone Encounter (Signed)
Spoke with Ms. Barbara [name] and she stated that she is doing very well . No nausea, vomiting, or diarrhea.Eating well.  Did weeding in the garden Saturday. She is a little constipated.

## 2013-12-22 ENCOUNTER — Other Ambulatory Visit: Payer: Self-pay | Admitting: *Deleted

## 2013-12-22 DIAGNOSIS — C50411 Malignant neoplasm of upper-outer quadrant of right female breast: Secondary | ICD-10-CM

## 2013-12-23 ENCOUNTER — Ambulatory Visit (HOSPITAL_BASED_OUTPATIENT_CLINIC_OR_DEPARTMENT_OTHER): Payer: BC Managed Care – PPO | Admitting: Oncology

## 2013-12-23 ENCOUNTER — Ambulatory Visit (HOSPITAL_BASED_OUTPATIENT_CLINIC_OR_DEPARTMENT_OTHER): Payer: BC Managed Care – PPO

## 2013-12-23 ENCOUNTER — Other Ambulatory Visit (HOSPITAL_BASED_OUTPATIENT_CLINIC_OR_DEPARTMENT_OTHER): Payer: BC Managed Care – PPO

## 2013-12-23 VITALS — BP 133/64 | HR 78 | Temp 98.3°F | Resp 18 | Ht 66.0 in | Wt 155.6 lb

## 2013-12-23 DIAGNOSIS — R5383 Other fatigue: Secondary | ICD-10-CM

## 2013-12-23 DIAGNOSIS — C50411 Malignant neoplasm of upper-outer quadrant of right female breast: Secondary | ICD-10-CM

## 2013-12-23 DIAGNOSIS — C50419 Malignant neoplasm of upper-outer quadrant of unspecified female breast: Secondary | ICD-10-CM

## 2013-12-23 DIAGNOSIS — R5381 Other malaise: Secondary | ICD-10-CM

## 2013-12-23 DIAGNOSIS — Z5111 Encounter for antineoplastic chemotherapy: Secondary | ICD-10-CM

## 2013-12-23 DIAGNOSIS — Z171 Estrogen receptor negative status [ER-]: Secondary | ICD-10-CM

## 2013-12-23 LAB — COMPREHENSIVE METABOLIC PANEL (CC13)
ALBUMIN: 3.7 g/dL (ref 3.5–5.0)
ALT: 27 U/L (ref 0–55)
AST: 20 U/L (ref 5–34)
Alkaline Phosphatase: 92 U/L (ref 40–150)
Anion Gap: 8 mEq/L (ref 3–11)
BUN: 21.1 mg/dL (ref 7.0–26.0)
CO2: 25 mEq/L (ref 22–29)
Calcium: 9.3 mg/dL (ref 8.4–10.4)
Chloride: 106 mEq/L (ref 98–109)
Creatinine: 0.8 mg/dL (ref 0.6–1.1)
Glucose: 79 mg/dl (ref 70–140)
POTASSIUM: 4.6 meq/L (ref 3.5–5.1)
SODIUM: 139 meq/L (ref 136–145)
Total Bilirubin: 0.29 mg/dL (ref 0.20–1.20)
Total Protein: 6.7 g/dL (ref 6.4–8.3)

## 2013-12-23 LAB — CBC WITH DIFFERENTIAL/PLATELET
BASO%: 1 % (ref 0.0–2.0)
Basophils Absolute: 0.1 10*3/uL (ref 0.0–0.1)
EOS ABS: 0.3 10*3/uL (ref 0.0–0.5)
EOS%: 3.8 % (ref 0.0–7.0)
HCT: 38.8 % (ref 34.8–46.6)
HGB: 13.1 g/dL (ref 11.6–15.9)
LYMPH#: 2.5 10*3/uL (ref 0.9–3.3)
LYMPH%: 36.3 % (ref 14.0–49.7)
MCH: 30.7 pg (ref 25.1–34.0)
MCHC: 33.9 g/dL (ref 31.5–36.0)
MCV: 90.6 fL (ref 79.5–101.0)
MONO#: 0.4 10*3/uL (ref 0.1–0.9)
MONO%: 6.3 % (ref 0.0–14.0)
NEUT%: 52.6 % (ref 38.4–76.8)
NEUTROS ABS: 3.6 10*3/uL (ref 1.5–6.5)
Platelets: 312 10*3/uL (ref 145–400)
RBC: 4.28 10*6/uL (ref 3.70–5.45)
RDW: 13.2 % (ref 11.2–14.5)
WBC: 6.7 10*3/uL (ref 3.9–10.3)

## 2013-12-23 MED ORDER — DIPHENHYDRAMINE HCL 50 MG/ML IJ SOLN
25.0000 mg | Freq: Once | INTRAMUSCULAR | Status: AC
  Administered 2013-12-23: 25 mg via INTRAVENOUS

## 2013-12-23 MED ORDER — ONDANSETRON 8 MG/50ML IVPB (CHCC)
8.0000 mg | Freq: Once | INTRAVENOUS | Status: AC
Start: 2013-12-23 — End: 2013-12-23
  Administered 2013-12-23: 8 mg via INTRAVENOUS

## 2013-12-23 MED ORDER — HEPARIN SOD (PORK) LOCK FLUSH 100 UNIT/ML IV SOLN
500.0000 [IU] | Freq: Once | INTRAVENOUS | Status: AC | PRN
  Administered 2013-12-23: 500 [IU]
  Filled 2013-12-23: qty 5

## 2013-12-23 MED ORDER — FAMOTIDINE IN NACL 20-0.9 MG/50ML-% IV SOLN
INTRAVENOUS | Status: AC
  Filled 2013-12-23: qty 50

## 2013-12-23 MED ORDER — FAMOTIDINE IN NACL 20-0.9 MG/50ML-% IV SOLN
20.0000 mg | Freq: Once | INTRAVENOUS | Status: AC
  Administered 2013-12-23: 20 mg via INTRAVENOUS

## 2013-12-23 MED ORDER — DEXAMETHASONE SODIUM PHOSPHATE 10 MG/ML IJ SOLN
10.0000 mg | Freq: Once | INTRAMUSCULAR | Status: AC
  Administered 2013-12-23: 10 mg via INTRAVENOUS

## 2013-12-23 MED ORDER — DIPHENHYDRAMINE HCL 50 MG/ML IJ SOLN
INTRAMUSCULAR | Status: AC
  Filled 2013-12-23: qty 1

## 2013-12-23 MED ORDER — SODIUM CHLORIDE 0.9 % IV SOLN
Freq: Once | INTRAVENOUS | Status: AC
  Administered 2013-12-23: 12:00:00 via INTRAVENOUS

## 2013-12-23 MED ORDER — DEXAMETHASONE SODIUM PHOSPHATE 10 MG/ML IJ SOLN
INTRAMUSCULAR | Status: AC
  Filled 2013-12-23: qty 1

## 2013-12-23 MED ORDER — ONDANSETRON 8 MG/NS 50 ML IVPB
INTRAVENOUS | Status: AC
  Filled 2013-12-23: qty 8

## 2013-12-23 MED ORDER — SODIUM CHLORIDE 0.9 % IV SOLN
80.0000 mg/m2 | Freq: Once | INTRAVENOUS | Status: AC
  Administered 2013-12-23: 144 mg via INTRAVENOUS
  Filled 2013-12-23: qty 24

## 2013-12-23 MED ORDER — SODIUM CHLORIDE 0.9 % IJ SOLN
10.0000 mL | INTRAMUSCULAR | Status: DC | PRN
Start: 2013-12-23 — End: 2013-12-23
  Administered 2013-12-23: 10 mL
  Filled 2013-12-23: qty 10

## 2013-12-23 NOTE — Progress Notes (Signed)
ID: Barbara [name] OB: June 06, 1951  MR#: [id number]  LNL#:[id number]  PCP: Barbara Casco, MD GYN:  Felipa Emory SU: Star Age OTHER EY:CXKGYJ [name]  CHIEF COMPLAINT: "I have been found to have breast cancer".  HISTORY OF PRESENT ILLNESS: The patient has a very strong family history for breast cancer, although she has tested  negative for the BRCA mutations (as have 2 of her sisters). She had been receiving breast MRI in addition to mammography, but had not had this test performed since October of 2010. More recently, 07/22/2013, routine screening mammography with tomography at [facility] showed no worrisome findings in the setting of extremely dense breasts.  MRI of the breast 09/08/2013, however, showed in the right upper outer quadrant an irregular mass measuring 1.7 cm. This was new as compared to the prior MRI from 2010. There were no abnormal lymph nodes and no other areas of concern in either breast. Ultrasound-guided biopsy of this mass 09/21/2013 showed ([id number]) and invasive ductal carcinoma, grade 2, estrogen and progesterone receptor positive, HER-2 amplified by CISH with a ratio of 5.55 (average HER-2 copy number Purcell of 13.05). The MIB-1 was 25%.  The patient's subsequent history is as detailed below  INTERVAL HISTORY: Barbara [name] returns today for followup of her breast cancer accompanied by her husband Barbara [name]. today is day 8 cycle 1 of her Taxol/trastuzumab, receiving Taxol days 1, 8 and 15 of each cycle, and trastuzumab on days 1 and 15.  REVIEW OF SYSTEMS: She did terrific with her first dose, with no nausea wall. She had mild constipation. She has not started to lose her hair. She was able to do 2 hours of gardening on day 2 and then one hour of gardening and day 3, and back to work on day 4. A detailed review of systems today was entirely negative  PAST MEDICAL HISTORY: Past Medical History  Diagnosis Date  . Breast cancer   . Hashimoto's thyroiditis     with goiter   . Hypercholesteremia   . 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  Narrowing of the 4th and 5th lumbar interspaces.   Marland Kitchen Spina bifida     occutta L5  . Anxiety   . GERD (gastroesophageal reflux disease)   . Headache(784.0)     hx migraines    PAST SURGICAL HISTORY: Past Surgical History  Procedure Laterality Date  . Hernia repair    . Tubal ligation    . Cystoscopy with urethral caruncle    . Mastectomy w/ sentinel node biopsy Bilateral 10/13/2013    Procedure:  BILATERAL MASTECTOMY WITH RIGHT SENTINEL LYMPH NODE BIOPSY;  Surgeon: Merrie Roof, MD;  Location: [place];  Service: General;  Laterality: Bilateral;  . Portacath placement Left 10/13/2013    Procedure: INSERTION PORT-A-CATH;  Surgeon: Merrie Roof, MD;  Location: [place];  Service: General;  Laterality: Left;  . Breast reconstruction with placement of tissue expander and flex hd (acellular hydrated dermis) Bilateral 10/13/2013    Procedure: PLACEMENT OF BILATERAL TISSUE EXPANDER FOR BREAST RECONSTRUCTION ;  Surgeon: Crissie Reese, MD;  Location: [place];  Service: Plastics;  Laterality: Bilateral;    FAMILY HISTORY Family History  Problem Relation Age of Onset  . Breast cancer Mother 65  . Lung cancer Father   . Breast cancer Sister 85    Bilateral breast cancer with 2nd dx at 51  . Breast cancer Maternal Aunt 52  .  Breast cancer Sister 4  . Lung cancer Maternal Uncle   . Leukemia Paternal Uncle   . Melanoma Brother 52    on ear  . Prostate cancer Brother 5  . Cancer Cousin     maternal cousin with cancer - NOS  The patient's mother was diagnosed with breast cancer the age of 38. She died at the age of 38. The patient's father died at the age of 99, with a history of lung cancer. The patient had 2 brothers, and 2 sisters. The patient's sister Barbara [name] has a history of bilateral breast cancer in her sister Barbara [name] a history of left breast cancer. Both have been tested  and are found to be negative for a BRCA one or 2 mutation. The patient's mother's sister also had a history of breast cancer. The patient herself has been tested for the BRCA mutation as well as through [facility]. No mutation has been file.   GYNECOLOGIC HISTORY:  Menarche age 56, first live birth age 67. The patient is GX P2. She stopped having periods in her early 28s. She did not take hormone replacement.  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 Her husband Barbara [name] used to work for [employer] an old. Daughter Barbara [name] works as a Music therapist in [place]. Son Barbara [name] is a drug representative for [employer] (diabetes). His wife, and December, is a Ship broker in [place] emergency room's. The patient has 5 grandchildren. She at tends [place]    ADVANCED DIRECTIVES:  In place   HEALTH MAINTENANCE: History  Substance Use Topics  . Smoking status: Never Smoker   . Smokeless tobacco: Never Used  . Alcohol Use: Yes     Comment: rarely     Colonoscopy: 2013/ [place]  PAP: 2013  Bone density: 2013/osteopenia  Lipid panel:  Allergies  Allergen Reactions  . Buprenex [Buprenorphine] Nausea And Vomiting and Other (See Comments)    Oversedation  . Simvastatin Other (See Comments)    Myalgias   . Codeine Hives, Itching and Rash    Current Outpatient Prescriptions  Medication Sig Dispense Refill  . aspirin 81 MG tablet Take 81 mg by mouth daily.      Marland Kitchen atorvastatin (LIPITOR) 10 MG tablet Take 10 mg by mouth at bedtime.       . Calcium-Vitamin D-Vitamin K (VIACTIV) 630-160-10 MG-UNT-MCG CHEW Chew 1 tablet by mouth.       . Cholecalciferol (D3-1000) 1000 UNITS tablet Take 1,000 Units by mouth daily.      Marland Kitchen levothyroxine (SYNTHROID, LEVOTHROID) 50 MCG tablet Take 50 mcg by mouth daily before breakfast. Take 50 mcg 1 hours before breakfast.      . lidocaine-prilocaine (EMLA) cream Apply 1 application topically  as needed.  30 g  0  . methocarbamol (ROBAXIN) 500 MG tablet as needed.      . mometasone (NASONEX) 50 MCG/ACT nasal spray Place 2 sprays into the nose daily.      . Nutritional Supplements (JUICE PLUS FIBRE) LIQD Take by mouth.      Marland Kitchen PARoxetine (PAXIL) 20 MG tablet Take 20 mg by mouth at bedtime.        No current facility-administered medications for this visit.    OBJECTIVE:  Middle-aged white woman who looks well Filed Vitals:   12/23/13 1000  BP: 133/64  Pulse: 78  Temp: 98.3 F (36.8 C)  Resp: 18     Body mass index is  25.13 kg/(m^2).    ECOG FS:0 - Asymptomatic  Ocular: Sclerae unicteric, pupils equal and round Ear-nose-throat: Oropharynx clear and moist Lymphatic: No cervical or supraclavicular adenopathy Lungs no rales or rhonchi, good excursion bilaterally Heart regular rate and rhythm, no murmur appreciated Abd soft, nontender, positive bowel sounds MSK no focal spinal tenderness, no upper extremity lymphedema Neuro: non-focal, well-oriented, pleasant affect Breasts:  Deferred.Marland Kitchen   LAB RESULTS:  CMP     Component Value Date/Time   NA 139 12/23/2013 1021   NA 138 10/12/2013 0940   K 4.6 12/23/2013 1021   K 4.3 10/12/2013 0940   CL 102 10/12/2013 0940   CO2 25 12/23/2013 1021   CO2 27 10/12/2013 0940   GLUCOSE 79 12/23/2013 1021   GLUCOSE 96 10/12/2013 0940   BUN 21.1 12/23/2013 1021   BUN 18 10/12/2013 0940   CREATININE 0.8 12/23/2013 1021   CREATININE 0.91 10/12/2013 0940   CALCIUM 9.3 12/23/2013 1021   CALCIUM 9.7 10/12/2013 0940   PROT 6.7 12/23/2013 1021   ALBUMIN 3.7 12/23/2013 1021   AST 20 12/23/2013 1021   ALT 27 12/23/2013 1021   ALKPHOS 92 12/23/2013 1021   BILITOT 0.29 12/23/2013 1021   GFRNONAA 66* 10/12/2013 0940   GFRAA 76* 10/12/2013 0940    I No results found for this basename: SPEP,  UPEP,   kappa and lambda light chains    Lab Results  Component Value Date   WBC 6.7 12/23/2013   NEUTROABS 3.6 12/23/2013   HGB 13.1 12/23/2013   HCT 38.8  12/23/2013   MCV 90.6 12/23/2013   PLT 312 12/23/2013      Chemistry      Component Value Date/Time   NA 139 12/23/2013 1021   NA 138 10/12/2013 0940   K 4.6 12/23/2013 1021   K 4.3 10/12/2013 0940   CL 102 10/12/2013 0940   CO2 25 12/23/2013 1021   CO2 27 10/12/2013 0940   BUN 21.1 12/23/2013 1021   BUN 18 10/12/2013 0940   CREATININE 0.8 12/23/2013 1021   CREATININE 0.91 10/12/2013 0940      Component Value Date/Time   CALCIUM 9.3 12/23/2013 1021   CALCIUM 9.7 10/12/2013 0940   ALKPHOS 92 12/23/2013 1021   AST 20 12/23/2013 1021   ALT 27 12/23/2013 1021   BILITOT 0.29 12/23/2013 1021       No results found for this basename: LABCA2    No components found with this basename: LABCA125    No results found for this basename: INR,  in the last 168 hours  Urinalysis No results found for this basename: colorurine,  appearanceur,  labspec,  phurine,  glucoseu,  hgbur,  bilirubinur,  ketonesur,  proteinur,  urobilinogen,  nitrite,  leukocytesur    STUDIES: Transthoracic Echocardiography  Patient: Virgilia, Quigg MR #: [id number] Study Date: 12/06/2013 Gender: F Age: 63 Height: 167.6cm Weight: 69.4kg BSA: 1.23m2 Pt. Status: Room:  ATTENDING Magrinat, GHazle NordmannORDERING Magrinat, GHazle NordmannREFERRING Magrinat, GMeridianGOsborne CascoSONOGRAPHER B[place] RDCS PERFORMING [facility], Outpatient cc:  ------------------------------------------------------------ LV EF: 55% - 60%  ------------------------------------------------------------  ASSESSMENT: 63y.o. BRCA negative [place] woman, status post right breast biopsy 09/21/2013 for a clinical T1c N0, stage IA invasive ductal carcinoma, grade 2, triple positive, with a HER-2: CED 17 ratio of 5.55 and an average copy number per cell of  13.05. The MIB-1-1 was 25%  (1) the patient participated in the Star study remotely, completing 5 years of  tamoxifen in the late 1990s  (2) status  post bilateral mastectomies 10/13/2013 showing:  (a) on the left, atypical ductal hyperplasia  (b) on the right, a pT1c pN0, stage IA invasive ductal carcinoma, grade 2  (3) taxol (days 1,8 and 15 of each 28 day cycle) with trastuzuman (given every 2 weeks) started 12/15/2012  PLAN: Ioma did grade with her first dose of Taxol/Herceptin. She will receive the Taxol although today, this being day 8 cycle 1 area and she really has an appointment for next week and then she is also weak. She will start her second cycle on February 20, and with bad 1 and a subsequent cycles we will only see her on the first day, since she is doing so well.  Today I suggested she not take the dexamethasone, but instead take the Compazine as prescribed. If it makes her very sleepy as she can take a half dose. When we see her next week, if this worked well for her, possibly she can take the Compazine only for 24 hours and that probably is where I would leave her anti-emetics.  She did get slightly constipated. I urged her not to let herself go too far behind and particularly we do not want hard bowel movements. She will be using stool softeners and laxatives as needed to accomplish that.  Nialah knows to call for any problems that may develop before her next visit here, a week from now.  Chauncey Cruel, MD   12/23/2013 11:45 AM

## 2013-12-23 NOTE — Patient Instructions (Signed)
[facility] Discharge Instructions for Patients Receiving Chemotherapy  Today you received the following chemotherapy agents: Taxol  To help prevent nausea and vomiting after your treatment, we encourage you to take your nausea medication as prescribed by your physician.  If you develop nausea and vomiting that is not controlled by your nausea medication, call the clinic.   BELOW ARE SYMPTOMS THAT SHOULD BE REPORTED IMMEDIATELY:  *FEVER GREATER THAN 100.5 F  *CHILLS WITH OR WITHOUT FEVER  NAUSEA AND VOMITING THAT IS NOT CONTROLLED WITH YOUR NAUSEA MEDICATION  *UNUSUAL SHORTNESS OF BREATH  *UNUSUAL BRUISING OR BLEEDING  TENDERNESS IN MOUTH AND THROAT WITH OR WITHOUT PRESENCE OF ULCERS  *URINARY PROBLEMS  *BOWEL PROBLEMS  UNUSUAL RASH Items with * indicate a potential emergency and should be followed up as soon as possible.  Feel free to call the clinic you have any questions or concerns. The clinic phone number is [phone].    

## 2013-12-26 ENCOUNTER — Other Ambulatory Visit (HOSPITAL_COMMUNITY): Payer: BC Managed Care – PPO

## 2013-12-29 ENCOUNTER — Other Ambulatory Visit: Payer: Self-pay | Admitting: *Deleted

## 2013-12-29 DIAGNOSIS — C50411 Malignant neoplasm of upper-outer quadrant of right female breast: Secondary | ICD-10-CM

## 2013-12-30 ENCOUNTER — Ambulatory Visit: Payer: BC Managed Care – PPO

## 2013-12-30 ENCOUNTER — Telehealth: Payer: Self-pay | Admitting: *Deleted

## 2013-12-30 ENCOUNTER — Ambulatory Visit (HOSPITAL_BASED_OUTPATIENT_CLINIC_OR_DEPARTMENT_OTHER): Payer: BC Managed Care – PPO | Admitting: Physician Assistant

## 2013-12-30 ENCOUNTER — Encounter: Payer: Self-pay | Admitting: Physician Assistant

## 2013-12-30 ENCOUNTER — Ambulatory Visit (HOSPITAL_BASED_OUTPATIENT_CLINIC_OR_DEPARTMENT_OTHER): Payer: BC Managed Care – PPO

## 2013-12-30 VITALS — BP 129/73 | HR 74 | Temp 98.3°F | Resp 16 | Ht 66.0 in | Wt 156.7 lb

## 2013-12-30 DIAGNOSIS — Z5111 Encounter for antineoplastic chemotherapy: Secondary | ICD-10-CM

## 2013-12-30 DIAGNOSIS — C50411 Malignant neoplasm of upper-outer quadrant of right female breast: Secondary | ICD-10-CM

## 2013-12-30 DIAGNOSIS — C50419 Malignant neoplasm of upper-outer quadrant of unspecified female breast: Secondary | ICD-10-CM

## 2013-12-30 DIAGNOSIS — M949 Disorder of cartilage, unspecified: Secondary | ICD-10-CM

## 2013-12-30 DIAGNOSIS — M899 Disorder of bone, unspecified: Secondary | ICD-10-CM

## 2013-12-30 DIAGNOSIS — Z5112 Encounter for antineoplastic immunotherapy: Secondary | ICD-10-CM

## 2013-12-30 DIAGNOSIS — Z170 Estrogen receptor positive status [ER+]: Secondary | ICD-10-CM

## 2013-12-30 DIAGNOSIS — Z803 Family history of malignant neoplasm of breast: Secondary | ICD-10-CM

## 2013-12-30 LAB — CBC WITH DIFFERENTIAL/PLATELET
BASO%: 1 % (ref 0.0–2.0)
BASOS ABS: 0.1 10*3/uL (ref 0.0–0.1)
EOS%: 3.2 % (ref 0.0–7.0)
Eosinophils Absolute: 0.2 10*3/uL (ref 0.0–0.5)
HCT: 38.2 % (ref 34.8–46.6)
HGB: 12.8 g/dL (ref 11.6–15.9)
LYMPH%: 33.9 % (ref 14.0–49.7)
MCH: 30.1 pg (ref 25.1–34.0)
MCHC: 33.5 g/dL (ref 31.5–36.0)
MCV: 89.9 fL (ref 79.5–101.0)
MONO#: 0.4 10*3/uL (ref 0.1–0.9)
MONO%: 7.8 % (ref 0.0–14.0)
NEUT%: 54.1 % (ref 38.4–76.8)
NEUTROS ABS: 2.7 10*3/uL (ref 1.5–6.5)
PLATELETS: 272 10*3/uL (ref 145–400)
RBC: 4.25 10*6/uL (ref 3.70–5.45)
RDW: 13.2 % (ref 11.2–14.5)
WBC: 5 10*3/uL (ref 3.9–10.3)
lymph#: 1.7 10*3/uL (ref 0.9–3.3)

## 2013-12-30 MED ORDER — DIPHENHYDRAMINE HCL 50 MG/ML IJ SOLN
25.0000 mg | Freq: Once | INTRAMUSCULAR | Status: AC
  Administered 2013-12-30: 13:00:00 via INTRAVENOUS

## 2013-12-30 MED ORDER — FAMOTIDINE IN NACL 20-0.9 MG/50ML-% IV SOLN
20.0000 mg | Freq: Once | INTRAVENOUS | Status: AC
  Administered 2013-12-30: 20 mg via INTRAVENOUS

## 2013-12-30 MED ORDER — SODIUM CHLORIDE 0.9 % IJ SOLN
10.0000 mL | INTRAMUSCULAR | Status: DC | PRN
  Administered 2013-12-30: 10 mL
  Filled 2013-12-30: qty 10

## 2013-12-30 MED ORDER — ONDANSETRON 8 MG/50ML IVPB (CHCC)
8.0000 mg | Freq: Once | INTRAVENOUS | Status: AC
  Administered 2013-12-30: 8 mg via INTRAVENOUS

## 2013-12-30 MED ORDER — FAMOTIDINE IN NACL 20-0.9 MG/50ML-% IV SOLN
INTRAVENOUS | Status: AC
  Filled 2013-12-30: qty 50

## 2013-12-30 MED ORDER — SODIUM CHLORIDE 0.9 % IV SOLN
Freq: Once | INTRAVENOUS | Status: AC
  Administered 2013-12-30: 13:00:00 via INTRAVENOUS

## 2013-12-30 MED ORDER — DEXAMETHASONE SODIUM PHOSPHATE 10 MG/ML IJ SOLN
INTRAMUSCULAR | Status: AC
  Filled 2013-12-30: qty 1

## 2013-12-30 MED ORDER — DIPHENHYDRAMINE HCL 50 MG/ML IJ SOLN
INTRAMUSCULAR | Status: AC
  Filled 2013-12-30: qty 1

## 2013-12-30 MED ORDER — ONDANSETRON 8 MG/NS 50 ML IVPB
INTRAVENOUS | Status: AC
  Filled 2013-12-30: qty 8

## 2013-12-30 MED ORDER — DEXAMETHASONE SODIUM PHOSPHATE 10 MG/ML IJ SOLN
10.0000 mg | Freq: Once | INTRAMUSCULAR | Status: AC
  Administered 2013-12-30: 10 mg via INTRAVENOUS

## 2013-12-30 MED ORDER — HEPARIN SOD (PORK) LOCK FLUSH 100 UNIT/ML IV SOLN
500.0000 [IU] | Freq: Once | INTRAVENOUS | Status: AC | PRN
  Administered 2013-12-30: 500 [IU]
  Filled 2013-12-30: qty 5

## 2013-12-30 MED ORDER — ACETAMINOPHEN 325 MG PO TABS: 650.0000 mg | ORAL_TABLET | Freq: Once | ORAL | Status: DC

## 2013-12-30 MED ORDER — TRASTUZUMAB CHEMO INJECTION 440 MG
4.0000 mg/kg | Freq: Once | INTRAVENOUS | Status: AC
  Administered 2013-12-30: 273 mg via INTRAVENOUS
  Filled 2013-12-30: qty 13

## 2013-12-30 MED ORDER — SODIUM CHLORIDE 0.9 % IV SOLN
80.0000 mg/m2 | Freq: Once | INTRAVENOUS | Status: AC
  Administered 2013-12-30: 144 mg via INTRAVENOUS
  Filled 2013-12-30: qty 24

## 2013-12-30 NOTE — Telephone Encounter (Signed)
appts made and printed. Pt is aware that tx will be added. i emailed MW to add the tx...td 

## 2013-12-30 NOTE — Telephone Encounter (Signed)
Per staff message and POF I have scheduled appts.  JMW  

## 2013-12-30 NOTE — Progress Notes (Signed)
ID: Barbara [name] OB: [birth year]  MR#: [id number]  KXF#:[id number]  PCP: [name], MD GYN:  Felipa Emory SU: [facility] Age OTHER IR:CVELFY [name]  CHIEF COMPLAINT: Right Breast Cancer  HISTORY OF PRESENT ILLNESS: The patient has a very strong family history for breast cancer, although she has tested  negative for the BRCA mutations (as have 2 of her sisters). She had been receiving breast MRI in addition to mammography, but had not had this test performed since October of 2010. More recently, 07/22/2013, routine screening mammography with tomography at [facility] showed no worrisome findings in the setting of extremely dense breasts.  MRI of the breast 09/08/2013, however, showed in the right upper outer quadrant an irregular mass measuring 1.7 cm. This was new as compared to the prior MRI from 2010. There were no abnormal lymph nodes and no other areas of concern in either breast. 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 The MIB-1 was 25%.  The patient's subsequent history is as detailed below  INTERVAL HISTORY: Barbara [name] returns today accompanied by her husband Ed for followup of her right breast cancer.  She's currently receiving paclitaxel and trastuzumab, the paclitaxel given on days 1, 8, and 15 and trastuzumab on days 1 and 15 of each 28 day cycle. Today is day 15 cycle 1, and she is due for both agents.   She has been tolerating treatment well thus far. Her only complaint is the fact that the Benadryl "knocks her out". She's been receiving a total of 50 mg with her premeds.  Otherwise, [name] has been on antibiotics for a mild dental infection. She's had no pain and denies any fevers or chills. I will mention that she was told by her dentist that she had some "bone deterioration" in  her jaw and we will need to keep that in mind if she is started on aromatase inhibitors in the future with her history of osteopenia.   REVIEW OF SYSTEMS: [name] has had no rashes or skin changes. She denies any abnormal bruising or bleeding. She's had no mouth ulcers or oral sensitivity and is eating well. She denies any problems whatsoever with nausea or change in bowel or bladder habits, other than the fact that her stools are slightly harder than they have been in the past. She is having regular bowel movements on a daily basis, however. She's had no cough, increased shortness of breath, chest pain, or palpitations. She's had no abnormal headaches. She denies any unusual myalgias, arthralgias, bony pain, or peripheral swelling. She also denies any numbness or tingling in her upper or lower extremities.  A detailed review of systems is otherwise stable and noncontributory.    PAST MEDICAL HISTORY: Past Medical History  Diagnosis Date  . Breast cancer   . Hashimoto's thyroiditis     with goiter  . Hypercholesteremia   . 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  Narrowing of the 4th and 5th lumbar interspaces.   Marland Kitchen Spina bifida     [place] L5  . Anxiety   . GERD (gastroesophageal reflux disease)   . Headache(784.0)     hx migraines    PAST SURGICAL HISTORY: Past Surgical History  Procedure Laterality Date  . Hernia repair    . Tubal  ligation    . Cystoscopy with urethral caruncle    . Mastectomy w/ sentinel node biopsy Bilateral 10/13/2013    Procedure:  BILATERAL MASTECTOMY WITH RIGHT SENTINEL LYMPH NODE BIOPSY;  Surgeon: Merrie Roof, MD;  Location: [facility];  Service: General;  Laterality: Bilateral;  . Portacath placement Left 10/13/2013    Procedure: INSERTION PORT-A-CATH;  Surgeon: Merrie Roof, MD;  Location: [facility];  Service: General;  Laterality: Left;  . Breast reconstruction with placement of tissue expander and flex hd  (acellular hydrated dermis) Bilateral 10/13/2013    Procedure: PLACEMENT OF BILATERAL TISSUE EXPANDER FOR BREAST RECONSTRUCTION ;  Surgeon: Crissie Reese, MD;  Location: [facility];  Service: Plastics;  Laterality: Bilateral;    FAMILY HISTORY Family History  Problem Relation Age of Onset  . Breast cancer Mother 76  . Lung cancer Father   . Breast cancer Sister 75    Bilateral breast cancer with 2nd dx at 36  . Breast cancer Maternal Aunt 52  . Breast cancer Sister 67  . Lung cancer Maternal Uncle   . Leukemia Paternal Uncle   . Melanoma Brother 52    on ear  . Prostate cancer Brother 27  . Cancer Cousin     maternal cousin with cancer - NOS  The patient's mother was diagnosed with breast cancer the age of 62. She died at the age of 38. The patient's father died at the age of 31, with a history of lung cancer. The patient had 2 brothers, and 2 sisters. The patient's sister [name] has a history of bilateral breast cancer in her sister [name] a history of left breast cancer. Both have been tested and are found to be negative for a BRCA one or 2 mutation. The patient's mother's sister also had a history of breast cancer. The patient herself has been tested for the BRCA mutation as well as through [facility]. No mutation has been file.   GYNECOLOGIC HISTORY:  Menarche age 54, first live birth age 56. The patient is GX P2. She stopped having periods in her early 52s. She did not take hormone replacement.  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 Her husband Ed used to work for [employer]. Daughter Barbara [name] works as a Music therapist in [place]. Son Barbara [name] is a drug representative for [employer] (diabetes). His wife, and December, is a Ship broker in [place] emergency room's. The patient has 5 grandchildren. She at tends [place]    ADVANCED DIRECTIVES:  In place   HEALTH  MAINTENANCE:  (Updated 12/30/2013) History  Substance Use Topics  . Smoking status: Never Smoker   . Smokeless tobacco: Never Used  . Alcohol Use: Yes     Comment: rarely     Colonoscopy: 2013/ [facility]  PAP: 2013/Dr. Sabra Heck  Bone density: 2013/osteopenia  Lipid panel: Not on file/Dr. Laurann Montana   Allergies  Allergen Reactions  . Buprenex [Buprenorphine] Nausea And Vomiting and Other (See Comments)    Oversedation  . Simvastatin Other (See Comments)    Myalgias   . Codeine Hives, Itching and Rash    Current Outpatient Prescriptions  Medication Sig Dispense Refill  . aspirin 81 MG tablet Take 81 mg by mouth daily.      Marland Kitchen atorvastatin (LIPITOR) 10 MG tablet Take 10 mg by mouth at bedtime.       . Calcium-Vitamin D-Vitamin K (VIACTIV) [id number] MG-UNT-MCG CHEW  Chew 1 tablet by mouth.       . Cholecalciferol (D3-1000) 1000 UNITS tablet Take 1,000 Units by mouth daily.      Marland Kitchen levothyroxine (SYNTHROID, LEVOTHROID) 50 MCG tablet Take 50 mcg by mouth daily before breakfast. Take 50 mcg 1 hours before breakfast.      . lidocaine-prilocaine (EMLA) cream Apply 1 application topically as needed.  30 g  0  . mometasone (NASONEX) 50 MCG/ACT nasal spray Place 2 sprays into the nose daily.      . Nutritional Supplements (JUICE PLUS FIBRE) LIQD Take by mouth.      Marland Kitchen PARoxetine (PAXIL) 20 MG tablet Take 20 mg by mouth at bedtime.       . prochlorperazine (COMPAZINE) 10 MG tablet       . LORazepam (ATIVAN) 0.5 MG tablet       . methocarbamol (ROBAXIN) 500 MG tablet as needed.       No current facility-administered medications for this visit.   Facility-Administered Medications Ordered in Other Visits  Medication Dose Route Frequency Provider Last Rate Last Dose  . acetaminophen (TYLENOL) tablet 650 mg  650 mg Oral Once Chauncey Cruel, MD      . heparin lock flush 100 unit/mL  500 Units Intracatheter Once PRN Chauncey Cruel, MD      . PACLitaxel (TAXOL) 144 mg in sodium chloride 0.9 %  250 mL chemo infusion (</= 3m/m2)  80 mg/m2 (Treatment Plan Actual) Intravenous Once GChauncey Cruel MD      . sodium chloride 0.9 % injection 10 mL  10 mL Intracatheter PRN GChauncey Cruel MD      . trastuzumab (HERCEPTIN) 273 mg in sodium chloride 0.9 % 250 mL chemo infusion  4 mg/kg (Treatment Plan Actual) Intravenous Once GChauncey Cruel MD        OBJECTIVE:  Middle-aged white woman in no acute distress Filed Vitals:   12/30/13 1307  BP: 129/73  Pulse: 74  Temp: 98.3 F (36.8 C)  Resp: 16  Body mass index is 25.3 kg/(m^2).   ECOG:  1 Filed Weights   12/30/13 1307  Weight: 156 lb 11.2 oz (71.079 kg)   Physical Exam: HEENT:  Sclerae anicteric.  Oropharynx clear and moist. No gingival erythema, swelling, or drainage indicating infection. No ulcerations and no evidence of oropharyngeal candidiasis. Neck is supple. Trachea midline.  NODES:  No cervical or supraclavicular lymphadenopathy palpated.  BREAST EXAM: Deferred. Axillae are benign bilaterally, with no palpable lymphadenopathy. LUNGS:  Clear to auscultation bilaterally.  No wheezes or rhonchi HEART:  Regular rate and rhythm. No murmur appreciated ABDOMEN:  Soft, nontender.  Positive bowel sounds.  MSK:  No focal spinal tenderness to palpation. Good range of motion bilaterally in the upper extremities. EXTREMITIES:  No peripheral edema.   SKIN:  Benign with no visible rashes or skin lesions. No nail dyscrasia. No pallor. No ecchymoses or petechiae. NEURO:  Nonfocal. Well oriented.  Pleasant affect.     LAB RESULTS:   Lab Results  Component Value Date   WBC 5.0 12/30/2013   NEUTROABS 2.7 12/30/2013   HGB 12.8 12/30/2013   HCT 38.2 12/30/2013   MCV 89.9 12/30/2013   PLT 272 12/30/2013      Chemistry      Component Value Date/Time   NA 139 12/23/2013 1021   NA 138 10/12/2013 0940   K 4.6 12/23/2013 1021   K 4.3 10/12/2013 0940   CL 102 10/12/2013 0940   CO2  25 12/23/2013 1021   CO2 27 10/12/2013 0940   BUN 21.1  12/23/2013 1021   BUN 18 10/12/2013 0940   CREATININE 0.8 12/23/2013 1021   CREATININE 0.91 10/12/2013 0940      Component Value Date/Time   CALCIUM 9.3 12/23/2013 1021   CALCIUM 9.7 10/12/2013 0940   ALKPHOS 92 12/23/2013 1021   AST 20 12/23/2013 1021   ALT 27 12/23/2013 1021   BILITOT 0.29 12/23/2013 1021      STUDIES:  Most recent echocardiogram on 12/06/2013 showed an ejection fraction of 55-60%. (This is scheduled to be repeated on 03/03/2014.)    ASSESSMENT: 63 y.o. BRCA negative [place] woman   (1)  status post right breast biopsy 09/21/2013 for a clinical T1c N0, stage IA invasive ductal carcinoma, grade 2, triple positive, with a HER-2: CED 17 ratio of 5.55 and an average copy number per cell of  13.05. The MIB-1-1 was 25%  (2) status post bilateral mastectomies 10/13/2013 showing:  (a) on the left, atypical ductal hyperplasia  (b) on the right, a pT1c pN0, stage IA invasive ductal carcinoma, grade 2  (3) currently being treated with paclitaxel/trastuzumab, with paclitaxel given on days 1, 8, and 15 and trastuzumab given every 2 weeks on days 1 and 15 of each 28 day cycle. First dose was given on 12/15/2012.   (4) The patient participated in the Star study remotely, completing 5 years of tamoxifen in the late 1990s   PLAN: Sheletha seems to be tolerating treatment well and will proceed with treatment today as scheduled for day 15 cycle 1, both paclitaxel and trastuzumab. As noted above, she has been receiving 50 mg of Benadryl with her premeds and it has been making her extremely sleepy. I am decreasing this to 25 mg with each treatment.   We again reviewed her antinausea regimen which will include prochlorperazine only taken for the next 24 hours, then only if needed for nausea.  She was given this information in writing.  Otherwise, I am making no changes to her current regimen. We will recheck labs only next week on February 12, and I will see her in 2 weeks on February  20th in anticipation of day 1 cycle 2. If she continues to do as well in the future as she has with cycle one, we will see her only on day 1 of each cycle. I will mention that she is already scheduled for her next echocardiogram in April.   All the above was reviewed with Minola and her husband today, both of whom voice understanding and agreement. They will call any changes or problems.   Jillienne Egner, PA-C   12/30/2013 1:08 PM

## 2013-12-30 NOTE — Patient Instructions (Addendum)
[facility] Discharge Instructions for Patients Receiving Chemotherapy  Today you received the following chemotherapy agents Taxol/Herceptin.  To help prevent nausea and vomiting after your treatment, we encourage you to take your nausea medication as needed.   If you develop nausea and vomiting that is not controlled by your nausea medication, call the clinic.   BELOW ARE SYMPTOMS THAT SHOULD BE REPORTED IMMEDIATELY:  *FEVER GREATER THAN 100.5 F  *CHILLS WITH OR WITHOUT FEVER  NAUSEA AND VOMITING THAT IS NOT CONTROLLED WITH YOUR NAUSEA MEDICATION  *UNUSUAL SHORTNESS OF BREATH  *UNUSUAL BRUISING OR BLEEDING  TENDERNESS IN MOUTH AND THROAT WITH OR WITHOUT PRESENCE OF ULCERS  *URINARY PROBLEMS  *BOWEL PROBLEMS  UNUSUAL RASH Items with * indicate a potential emergency and should be followed up as soon as possible.  Feel free to call the clinic you have any questions or concerns. The clinic phone number is [phone].  Have a great weekend!!!

## 2014-01-02 ENCOUNTER — Encounter: Payer: Self-pay | Admitting: Oncology

## 2014-01-05 ENCOUNTER — Telehealth: Payer: Self-pay | Admitting: *Deleted

## 2014-01-05 ENCOUNTER — Other Ambulatory Visit (HOSPITAL_BASED_OUTPATIENT_CLINIC_OR_DEPARTMENT_OTHER): Payer: BC Managed Care – PPO

## 2014-01-05 DIAGNOSIS — C50411 Malignant neoplasm of upper-outer quadrant of right female breast: Secondary | ICD-10-CM

## 2014-01-05 DIAGNOSIS — C50419 Malignant neoplasm of upper-outer quadrant of unspecified female breast: Secondary | ICD-10-CM

## 2014-01-05 LAB — CBC WITH DIFFERENTIAL/PLATELET
BASO%: 1.3 % (ref 0.0–2.0)
Basophils Absolute: 0.1 10*3/uL (ref 0.0–0.1)
EOS ABS: 0.1 10*3/uL (ref 0.0–0.5)
EOS%: 1.7 % (ref 0.0–7.0)
HEMATOCRIT: 38 % (ref 34.8–46.6)
HGB: 13.2 g/dL (ref 11.6–15.9)
LYMPH#: 2 10*3/uL (ref 0.9–3.3)
LYMPH%: 38.8 % (ref 14.0–49.7)
MCH: 31.1 pg (ref 25.1–34.0)
MCHC: 34.6 g/dL (ref 31.5–36.0)
MCV: 89.8 fL (ref 79.5–101.0)
MONO#: 0.3 10*3/uL (ref 0.1–0.9)
MONO%: 4.8 % (ref 0.0–14.0)
NEUT%: 53.4 % (ref 38.4–76.8)
NEUTROS ABS: 2.8 10*3/uL (ref 1.5–6.5)
Platelets: 310 10*3/uL (ref 145–400)
RBC: 4.23 10*6/uL (ref 3.70–5.45)
RDW: 13.5 % (ref 11.2–14.5)
WBC: 5.3 10*3/uL (ref 3.9–10.3)

## 2014-01-05 LAB — COMPREHENSIVE METABOLIC PANEL (CC13)
ALBUMIN: 3.8 g/dL (ref 3.5–5.0)
ALT: 48 U/L (ref 0–55)
ANION GAP: 9 meq/L (ref 3–11)
AST: 26 U/L (ref 5–34)
Alkaline Phosphatase: 91 U/L (ref 40–150)
BUN: 25.2 mg/dL (ref 7.0–26.0)
CO2: 26 mEq/L (ref 22–29)
CREATININE: 1 mg/dL (ref 0.6–1.1)
Calcium: 9.5 mg/dL (ref 8.4–10.4)
Chloride: 105 mEq/L (ref 98–109)
Glucose: 76 mg/dl (ref 70–140)
Potassium: 4.8 mEq/L (ref 3.5–5.1)
Sodium: 139 mEq/L (ref 136–145)
Total Bilirubin: 0.42 mg/dL (ref 0.20–1.20)
Total Protein: 6.9 g/dL (ref 6.4–8.3)

## 2014-01-05 NOTE — Telephone Encounter (Signed)
Per PA-C request. Called pt to inform her of lab results. Pt verbalized understanding. No further concerns. Message to be forwarded to [facility].

## 2014-01-13 ENCOUNTER — Ambulatory Visit (HOSPITAL_BASED_OUTPATIENT_CLINIC_OR_DEPARTMENT_OTHER): Payer: BC Managed Care – PPO

## 2014-01-13 ENCOUNTER — Ambulatory Visit (HOSPITAL_BASED_OUTPATIENT_CLINIC_OR_DEPARTMENT_OTHER): Payer: BC Managed Care – PPO | Admitting: Physician Assistant

## 2014-01-13 ENCOUNTER — Other Ambulatory Visit (HOSPITAL_BASED_OUTPATIENT_CLINIC_OR_DEPARTMENT_OTHER): Payer: BC Managed Care – PPO

## 2014-01-13 ENCOUNTER — Encounter: Payer: Self-pay | Admitting: Physician Assistant

## 2014-01-13 ENCOUNTER — Telehealth: Payer: Self-pay | Admitting: *Deleted

## 2014-01-13 ENCOUNTER — Telehealth: Payer: Self-pay | Admitting: Physician Assistant

## 2014-01-13 VITALS — BP 117/73 | HR 80 | Temp 98.9°F | Resp 18 | Ht 66.0 in | Wt 159.2 lb

## 2014-01-13 DIAGNOSIS — C50419 Malignant neoplasm of upper-outer quadrant of unspecified female breast: Secondary | ICD-10-CM

## 2014-01-13 DIAGNOSIS — R110 Nausea: Secondary | ICD-10-CM

## 2014-01-13 DIAGNOSIS — C50411 Malignant neoplasm of upper-outer quadrant of right female breast: Secondary | ICD-10-CM

## 2014-01-13 DIAGNOSIS — Z170 Estrogen receptor positive status [ER+]: Secondary | ICD-10-CM

## 2014-01-13 DIAGNOSIS — Z5112 Encounter for antineoplastic immunotherapy: Secondary | ICD-10-CM

## 2014-01-13 DIAGNOSIS — Z5111 Encounter for antineoplastic chemotherapy: Secondary | ICD-10-CM

## 2014-01-13 DIAGNOSIS — J310 Chronic rhinitis: Secondary | ICD-10-CM | POA: Insufficient documentation

## 2014-01-13 LAB — CBC WITH DIFFERENTIAL/PLATELET
BASO%: 1.3 % (ref 0.0–2.0)
BASOS ABS: 0.1 10*3/uL (ref 0.0–0.1)
EOS%: 2.8 % (ref 0.0–7.0)
Eosinophils Absolute: 0.2 10*3/uL (ref 0.0–0.5)
HCT: 39.1 % (ref 34.8–46.6)
HEMOGLOBIN: 13.2 g/dL (ref 11.6–15.9)
LYMPH#: 2 10*3/uL (ref 0.9–3.3)
LYMPH%: 31.1 % (ref 14.0–49.7)
MCH: 30.6 pg (ref 25.1–34.0)
MCHC: 33.8 g/dL (ref 31.5–36.0)
MCV: 90.6 fL (ref 79.5–101.0)
MONO#: 0.5 10*3/uL (ref 0.1–0.9)
MONO%: 8.1 % (ref 0.0–14.0)
NEUT%: 56.7 % (ref 38.4–76.8)
NEUTROS ABS: 3.6 10*3/uL (ref 1.5–6.5)
Platelets: 302 10*3/uL (ref 145–400)
RBC: 4.31 10*6/uL (ref 3.70–5.45)
RDW: 14.1 % (ref 11.2–14.5)
WBC: 6.3 10*3/uL (ref 3.9–10.3)

## 2014-01-13 MED ORDER — SODIUM CHLORIDE 0.9 % IJ SOLN
10.0000 mL | INTRAMUSCULAR | Status: DC | PRN
  Administered 2014-01-13: 10 mL
  Filled 2014-01-13: qty 10

## 2014-01-13 MED ORDER — DEXAMETHASONE SODIUM PHOSPHATE 10 MG/ML IJ SOLN
10.0000 mg | Freq: Once | INTRAMUSCULAR | Status: AC
  Administered 2014-01-13: 10 mg via INTRAVENOUS

## 2014-01-13 MED ORDER — DIPHENHYDRAMINE HCL 50 MG/ML IJ SOLN
INTRAMUSCULAR | Status: AC
  Filled 2014-01-13: qty 1

## 2014-01-13 MED ORDER — TRASTUZUMAB CHEMO INJECTION 440 MG
4.0000 mg/kg | Freq: Once | INTRAVENOUS | Status: AC
  Administered 2014-01-13: 273 mg via INTRAVENOUS
  Filled 2014-01-13: qty 13

## 2014-01-13 MED ORDER — SODIUM CHLORIDE 0.9 % IV SOLN
Freq: Once | INTRAVENOUS | Status: AC
  Administered 2014-01-13: 11:00:00 via INTRAVENOUS

## 2014-01-13 MED ORDER — ONDANSETRON 8 MG/50ML IVPB (CHCC)
8.0000 mg | Freq: Once | INTRAVENOUS | Status: AC
  Administered 2014-01-13: 8 mg via INTRAVENOUS

## 2014-01-13 MED ORDER — FAMOTIDINE IN NACL 20-0.9 MG/50ML-% IV SOLN
INTRAVENOUS | Status: AC
  Filled 2014-01-13: qty 50

## 2014-01-13 MED ORDER — DEXAMETHASONE SODIUM PHOSPHATE 10 MG/ML IJ SOLN
INTRAMUSCULAR | Status: AC
  Filled 2014-01-13: qty 1

## 2014-01-13 MED ORDER — DIPHENHYDRAMINE HCL 50 MG/ML IJ SOLN
25.0000 mg | Freq: Once | INTRAMUSCULAR | Status: AC
  Administered 2014-01-13: 25 mg via INTRAVENOUS

## 2014-01-13 MED ORDER — FAMOTIDINE IN NACL 20-0.9 MG/50ML-% IV SOLN
20.0000 mg | Freq: Once | INTRAVENOUS | Status: AC
  Administered 2014-01-13: 20 mg via INTRAVENOUS

## 2014-01-13 MED ORDER — HEPARIN SOD (PORK) LOCK FLUSH 100 UNIT/ML IV SOLN
500.0000 [IU] | Freq: Once | INTRAVENOUS | Status: AC | PRN
  Administered 2014-01-13: 500 [IU]
  Filled 2014-01-13: qty 5

## 2014-01-13 MED ORDER — ACETAMINOPHEN 325 MG PO TABS
650.0000 mg | ORAL_TABLET | Freq: Once | ORAL | Status: AC
  Administered 2014-01-13: 650 mg via ORAL

## 2014-01-13 MED ORDER — SODIUM CHLORIDE 0.9 % IV SOLN
80.0000 mg/m2 | Freq: Once | INTRAVENOUS | Status: AC
  Administered 2014-01-13: 144 mg via INTRAVENOUS
  Filled 2014-01-13: qty 24

## 2014-01-13 MED ORDER — MOMETASONE FUROATE 50 MCG/ACT NA SUSP: 2.0000 | Freq: Every day | NASAL | Status: DC

## 2014-01-13 MED ORDER — ONDANSETRON 8 MG/NS 50 ML IVPB
INTRAVENOUS | Status: AC
Start: 2014-01-13 — End: 2014-01-13
  Filled 2014-01-13: qty 8

## 2014-01-13 MED ORDER — ACETAMINOPHEN 325 MG PO TABS
ORAL_TABLET | ORAL | Status: AC
  Filled 2014-01-13: qty 2

## 2014-01-13 NOTE — Patient Instructions (Signed)
[facility] Discharge Instructions for Patients Receiving Chemotherapy  Today you received the following chemotherapy agents: taxol, herceptin  To help prevent nausea and vomiting after your treatment, we encourage you to take your nausea medication as prescribed.   If you develop nausea and vomiting that is not controlled by your nausea medication, call the clinic.   BELOW ARE SYMPTOMS THAT SHOULD BE REPORTED IMMEDIATELY:  *FEVER GREATER THAN 100.5 F  *CHILLS WITH OR WITHOUT FEVER  NAUSEA AND VOMITING THAT IS NOT CONTROLLED WITH YOUR NAUSEA MEDICATION  *UNUSUAL SHORTNESS OF BREATH  *UNUSUAL BRUISING OR BLEEDING  TENDERNESS IN MOUTH AND THROAT WITH OR WITHOUT PRESENCE OF ULCERS  *URINARY PROBLEMS  *BOWEL PROBLEMS  UNUSUAL RASH Items with * indicate a potential emergency and should be followed up as soon as possible.  Feel free to call the clinic you have any questions or concerns. The clinic phone number is [phone].

## 2014-01-13 NOTE — Telephone Encounter (Signed)
Per staff message and POF I have scheduled appts.  JMW  

## 2014-01-13 NOTE — Progress Notes (Signed)
ID: Barbara [name] OB: 1951-10-20  MR#: [id number]  WUJ#:[id number]  PCP: [name], MD GYN:  Felipa Emory SU: [facility] Age OTHER NF:[id number] [name]  CHIEF COMPLAINT: Right Breast Cancer  HISTORY OF PRESENT ILLNESS: The patient has a very strong family history for breast cancer, although she has tested  negative for the BRCA mutations (as have 2 of her sisters). She had been receiving breast MRI in addition to mammography, but had not had this test performed since October of 2010. More recently, 07/22/2013, routine screening mammography with tomography at [facility] showed no worrisome findings in the setting of extremely dense breasts.  MRI of the breast 09/08/2013, however, showed in the right upper outer quadrant an irregular mass measuring 1.7 cm. This was new as compared to the prior MRI from 2010. There were no abnormal lymph nodes and no other areas of concern in either breast. 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 The MIB-1 was 25%.  The patient's subsequent history is as detailed below  INTERVAL HISTORY: Barbara [name] returns today accompanied by her husband Ed for followup of her right breast cancer.  She is due for day 1 cycle 2 of paclitaxel/trastuzumab today, and is due for both agents today. Curretnly, the paclitaxel is being given given on days 1, 8, and 15 and trastuzumab on days 1 and 15 of each 28 day cycle.   Overall, she tolerated her first cycle well. She did have some nausea 2 weeks ago following day 15 cycle 1. She admits that she only took 3 doses of the prochlorperazine on Saturday, after the infusion on Friday. She had nausea on Saturday evening and Sunday morning. Fortunately, she denies any actual emesis.  Otherwise, Barbara [name] had no new complaints today. She is still  trying to stay busy, and tries to get out and do gardening when the weather allows it.    REVIEW OF SYSTEMS: Barbara [name] has had no fevers, chills, night sweats, rashes or skin changes. She denies any abnormal bruising or bleeding. She's had no mouth ulcers or oral sensitivity and is eating well. She recently completed a course of antibiotics as prescribed by her dentist, and has had no additional dental infections or problems. She denies a change in either bowel or bladder habits. She's had no cough, increased shortness of breath, orthopnea, peripheral swelling, chest pain, or palpitations. She's had no abnormal headaches or dizziness. She has some sinus congestion and rhinorrhea which is chronic, and she needs a refill on her Nasacort today which she has been taking for quite some time. She denies any unusual myalgias, arthralgias, bony pain, or  any signs of peripheral neuropathy.  A detailed review of systems is otherwise stable and noncontributory.    PAST MEDICAL HISTORY: Past Medical History  Diagnosis Date  . Breast cancer   . Hashimoto's thyroiditis     with goiter  . Hypercholesteremia   . 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  Narrowing of the 4th and 5th lumbar interspaces.   Marland Kitchen Spina bifida     occutta L5  . Anxiety   . GERD (gastroesophageal reflux disease)   . Headache(784.0)     hx migraines    PAST SURGICAL HISTORY: Past Surgical History  Procedure Laterality Date  . Hernia  repair    . Tubal ligation    . Cystoscopy with urethral caruncle    . Mastectomy w/ sentinel node biopsy Bilateral 10/13/2013    Procedure:  BILATERAL MASTECTOMY WITH RIGHT SENTINEL LYMPH NODE BIOPSY;  Surgeon: Merrie Roof, MD;  Location: [place];  Service: General;  Laterality: Bilateral;  . Portacath placement Left 10/13/2013    Procedure: INSERTION PORT-A-CATH;  Surgeon: Merrie Roof, MD;  Location: [place];  Service: General;  Laterality:  Left;  . Breast reconstruction with placement of tissue expander and flex hd (acellular hydrated dermis) Bilateral 10/13/2013    Procedure: PLACEMENT OF BILATERAL TISSUE EXPANDER FOR BREAST RECONSTRUCTION ;  Surgeon: Crissie Reese, MD;  Location: [place];  Service: Plastics;  Laterality: Bilateral;    FAMILY HISTORY Family History  Problem Relation Age of Onset  . Breast cancer Mother 10  . Lung cancer Father   . Breast cancer Sister 55    Bilateral breast cancer with 2nd dx at 106  . Breast cancer Maternal Aunt 52  . Breast cancer Sister 63  . Lung cancer Maternal Uncle   . Leukemia Paternal Uncle   . Melanoma Brother 52    on ear  . Prostate cancer Brother 58  . Cancer Cousin     maternal cousin with cancer - NOS  The patient's mother was diagnosed with breast cancer the age of 79. She died at the age of 70. The patient's father died at the age of 24, with a history of lung cancer. The patient had 2 brothers, and 2 sisters. The patient's sister [name] has a history of bilateral breast cancer in her sister [name] a history of left breast cancer. Both have been tested and are found to be negative for a BRCA one or 2 mutation. The patient's mother's sister also had a history of breast cancer. The patient herself has been tested for the BRCA mutation as well as through [facility]. No mutation has been file.   GYNECOLOGIC HISTORY:  Menarche age 4, first live birth age 53. The patient is GX P2. She stopped having periods in her early 61s. She did not take hormone replacement.  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 Her husband Ed used to work for [employer]. Daughter Barbara [name] works as a Music therapist in [place]. Son Barbara [name] is a drug representative for [employer] (diabetes). His wife, and December, is a Ship broker in [place] emergency room's. The patient has 5 grandchildren. She at tends [employer]    ADVANCED DIRECTIVES:  In place   HEALTH MAINTENANCE:  (Updated 12/30/2013) History  Substance Use Topics  . Smoking status: Never Smoker   . Smokeless tobacco: Never Used  . Alcohol Use: Yes     Comment: rarely     Colonoscopy: 2013/ [place]  PAP: 2013/Dr. Sabra Heck  Bone density: 2013/osteopenia  Lipid panel: Not on file/Dr. Laurann Montana   Allergies  Allergen Reactions  . Buprenex [Buprenorphine] Nausea And Vomiting and Other (See Comments)    Oversedation  . Simvastatin Other (See Comments)    Myalgias   . Codeine Hives, Itching and Rash    Current Outpatient Prescriptions  Medication Sig Dispense Refill  . aspirin 81 MG tablet Take 81 mg by mouth daily.      Marland Kitchen atorvastatin (LIPITOR) 10 MG tablet Take 10 mg by mouth at bedtime.       . Calcium-Vitamin  D-Vitamin K (VIACTIV) [id number] MG-UNT-MCG CHEW Chew 1 tablet by mouth.       . Cholecalciferol (D3-1000) 1000 UNITS tablet Take 1,000 Units by mouth daily.      Marland Kitchen levothyroxine (SYNTHROID, LEVOTHROID) 50 MCG tablet Take 50 mcg by mouth daily before breakfast. Take 50 mcg 1 hours before breakfast.      . lidocaine-prilocaine (EMLA) cream Apply 1 application topically as needed.  30 g  0  . mometasone (NASONEX) 50 MCG/ACT nasal spray Place 2 sprays into the nose daily.  17 g  2  . Nutritional Supplements (JUICE PLUS FIBRE) LIQD Take by mouth.      Marland Kitchen PARoxetine (PAXIL) 20 MG tablet Take 20 mg by mouth at bedtime.       Marland Kitchen LORazepam (ATIVAN) 0.5 MG tablet       . methocarbamol (ROBAXIN) 500 MG tablet as needed.      . prochlorperazine (COMPAZINE) 10 MG tablet        No current facility-administered medications for this visit.    OBJECTIVE:  Middle-aged white woman who appears comfortable and in his in no acute distress Filed Vitals:   01/13/14 0930  BP: 117/73  Pulse: 80  Temp: 98.9 F (37.2 C)  Resp: 18  Body mass index is 25.71 kg/(m^2).   ECOG:  0 Filed Weights   01/13/14 0930  Weight: 159 lb  3.2 oz (72.213 kg)   Physical Exam: HEENT:  Sclerae anicteric.  Oropharynx clear, pink, and moist.  No ulcerations and no evidence of oropharyngeal candidiasis. Neck is supple. Trachea midline.  NODES:  No cervical or supraclavicular lymphadenopathy palpated.  BREAST EXAM: Deferred. Axillae are benign bilaterally, with no palpable lymphadenopathy. LUNGS:  Clear to auscultation bilaterally with good excursion.  No wheezes or rhonchi HEART:  Regular rate and rhythm. No murmur appreciated ABDOMEN:  Soft, nontender. No organomegaly or masses palpated. Positive bowel sounds.  MSK:  No focal spinal tenderness to palpation. Good range of motion bilaterally in the upper extremities. EXTREMITIES:  No peripheral edema.   SKIN:  Benign with no visible rashes or skin lesions. No nail dyscrasia. No pallor. No ecchymoses or petechiae. NEURO:  Nonfocal. Well oriented. Appropriate affect.     LAB RESULTS:   Lab Results  Component Value Date   WBC 6.3 01/13/2014   NEUTROABS 3.6 01/13/2014   HGB 13.2 01/13/2014   HCT 39.1 01/13/2014   MCV 90.6 01/13/2014   PLT 302 01/13/2014      Chemistry      Component Value Date/Time   NA 139 01/05/2014 0845   NA 138 10/12/2013 0940   K 4.8 01/05/2014 0845   K 4.3 10/12/2013 0940   CL 102 10/12/2013 0940   CO2 26 01/05/2014 0845   CO2 27 10/12/2013 0940   BUN 25.2 01/05/2014 0845   BUN 18 10/12/2013 0940   CREATININE 1.0 01/05/2014 0845   CREATININE 0.91 10/12/2013 0940      Component Value Date/Time   CALCIUM 9.5 01/05/2014 0845   CALCIUM 9.7 10/12/2013 0940   ALKPHOS 91 01/05/2014 0845   AST 26 01/05/2014 0845   ALT 48 01/05/2014 0845   BILITOT 0.42 01/05/2014 0845      STUDIES:  Most recent echocardiogram on 12/06/2013 showed an ejection fraction of 55-60%. (This is scheduled to be repeated on 03/03/2014.)    ASSESSMENT: 63 y.o. BRCA negative [place] woman   (1)  status post right breast biopsy 09/21/2013 for a clinical T1c N0, stage IA invasive  ductal carcinoma, grade 2, triple positive, with a HER-2: CED 17 ratio of 5.55 and an average copy number per cell of  13.05. The MIB-1-1 was 25%  (2) status post bilateral mastectomies 10/13/2013 showing:  (a) on the left, atypical ductal hyperplasia  (b) on the right, a pT1c pN0, stage IA invasive ductal carcinoma, grade 2  (3) currently being treated with paclitaxel/trastuzumab, with paclitaxel given on days 1, 8, and 15 and trastuzumab given every 2 weeks on days 1 and 15 of each 28 day cycle. First dose was given on 12/15/2012.   (4) The patient participated in the Star study remotely, completing 5 years of tamoxifen in the late 1990s   PLAN: Aarika will proceed to treatment today as scheduled for day 1 cycle 2, both paclitaxel and trastuzumab today. We will continue to see her every 4 weeks when she returns on day 1 of each cycle. Of course we are glad to see her in between the scheduled visits should any problems arise, and she is aware that she can call at anytime to make an appointment.  Otherwise, she will receive her infusions as scheduled on February 27 and March 6, and I will see her 4 weeks from today on March 20 in anticipation of day 1 cycle 3. She is already scheduled for her repeat echocardiogram in early April.  We did review her antinausea regimen once again today. I have recommended that she take her prochlorperazine, 10 mg at dinner and bedtime on the day of chemotherapy, then 10 mg with each meal and at bedtime on the day following treatment, possibly continuing for 2 days. She can then take the prochlorperazine every 6 hours as needed for additional nausea. She has ondansetron on hand at home, and I recommended that she take this up to every 12 hours if needed for breakthrough nausea. She was given all of this information in writing today.  All the above was reviewed with Lilyth and her husband today, both of whom voice understanding and agreement with this plan.   Ragnar Waas,  PA-C   01/13/2014 10:30 AM

## 2014-01-13 NOTE — Telephone Encounter (Signed)
, °

## 2014-01-17 ENCOUNTER — Encounter: Payer: Self-pay | Admitting: Oncology

## 2014-01-20 ENCOUNTER — Ambulatory Visit (HOSPITAL_BASED_OUTPATIENT_CLINIC_OR_DEPARTMENT_OTHER): Payer: BC Managed Care – PPO

## 2014-01-20 ENCOUNTER — Other Ambulatory Visit (HOSPITAL_BASED_OUTPATIENT_CLINIC_OR_DEPARTMENT_OTHER): Payer: BC Managed Care – PPO

## 2014-01-20 DIAGNOSIS — C50419 Malignant neoplasm of upper-outer quadrant of unspecified female breast: Secondary | ICD-10-CM

## 2014-01-20 DIAGNOSIS — Z5111 Encounter for antineoplastic chemotherapy: Secondary | ICD-10-CM

## 2014-01-20 DIAGNOSIS — C50411 Malignant neoplasm of upper-outer quadrant of right female breast: Secondary | ICD-10-CM

## 2014-01-20 LAB — COMPREHENSIVE METABOLIC PANEL (CC13)
ALBUMIN: 3.7 g/dL (ref 3.5–5.0)
ALK PHOS: 93 U/L (ref 40–150)
ALT: 38 U/L (ref 0–55)
AST: 23 U/L (ref 5–34)
Anion Gap: 8 mEq/L (ref 3–11)
BUN: 27.5 mg/dL — ABNORMAL HIGH (ref 7.0–26.0)
CO2: 26 mEq/L (ref 22–29)
Calcium: 9.3 mg/dL (ref 8.4–10.4)
Chloride: 106 mEq/L (ref 98–109)
Creatinine: 0.9 mg/dL (ref 0.6–1.1)
GLUCOSE: 86 mg/dL (ref 70–140)
Potassium: 4.3 mEq/L (ref 3.5–5.1)
SODIUM: 139 meq/L (ref 136–145)
TOTAL PROTEIN: 6.8 g/dL (ref 6.4–8.3)
Total Bilirubin: 0.37 mg/dL (ref 0.20–1.20)

## 2014-01-20 LAB — CBC WITH DIFFERENTIAL/PLATELET
BASO%: 2.2 % — ABNORMAL HIGH (ref 0.0–2.0)
Basophils Absolute: 0.2 10*3/uL — ABNORMAL HIGH (ref 0.0–0.1)
EOS ABS: 0.2 10*3/uL (ref 0.0–0.5)
EOS%: 2.4 % (ref 0.0–7.0)
HCT: 38.7 % (ref 34.8–46.6)
HGB: 13.1 g/dL (ref 11.6–15.9)
LYMPH%: 28.3 % (ref 14.0–49.7)
MCH: 30.7 pg (ref 25.1–34.0)
MCHC: 34 g/dL (ref 31.5–36.0)
MCV: 90.3 fL (ref 79.5–101.0)
MONO#: 0.4 10*3/uL (ref 0.1–0.9)
MONO%: 6.4 % (ref 0.0–14.0)
NEUT#: 4.2 10*3/uL (ref 1.5–6.5)
NEUT%: 60.7 % (ref 38.4–76.8)
Platelets: 293 10*3/uL (ref 145–400)
RBC: 4.28 10*6/uL (ref 3.70–5.45)
RDW: 14 % (ref 11.2–14.5)
WBC: 6.8 10*3/uL (ref 3.9–10.3)
lymph#: 1.9 10*3/uL (ref 0.9–3.3)

## 2014-01-20 MED ORDER — DEXAMETHASONE SODIUM PHOSPHATE 10 MG/ML IJ SOLN
INTRAMUSCULAR | Status: AC
  Filled 2014-01-20: qty 1

## 2014-01-20 MED ORDER — SODIUM CHLORIDE 0.9 % IV SOLN
Freq: Once | INTRAVENOUS | Status: AC
  Administered 2014-01-20: 10:00:00 via INTRAVENOUS

## 2014-01-20 MED ORDER — FAMOTIDINE IN NACL 20-0.9 MG/50ML-% IV SOLN
20.0000 mg | Freq: Once | INTRAVENOUS | Status: AC
  Administered 2014-01-20: 20 mg via INTRAVENOUS

## 2014-01-20 MED ORDER — ONDANSETRON 8 MG/NS 50 ML IVPB
INTRAVENOUS | Status: AC
  Filled 2014-01-20: qty 8

## 2014-01-20 MED ORDER — SODIUM CHLORIDE 0.9 % IV SOLN
80.0000 mg/m2 | Freq: Once | INTRAVENOUS | Status: AC
  Administered 2014-01-20: 144 mg via INTRAVENOUS
  Filled 2014-01-20: qty 24

## 2014-01-20 MED ORDER — SODIUM CHLORIDE 0.9 % IJ SOLN
10.0000 mL | INTRAMUSCULAR | Status: DC | PRN
  Administered 2014-01-20: 10 mL
  Filled 2014-01-20: qty 10

## 2014-01-20 MED ORDER — DEXAMETHASONE SODIUM PHOSPHATE 10 MG/ML IJ SOLN
10.0000 mg | Freq: Once | INTRAMUSCULAR | Status: AC
  Administered 2014-01-20: 10 mg via INTRAVENOUS

## 2014-01-20 MED ORDER — DIPHENHYDRAMINE HCL 50 MG/ML IJ SOLN
INTRAMUSCULAR | Status: AC
  Filled 2014-01-20: qty 1

## 2014-01-20 MED ORDER — ONDANSETRON 8 MG/50ML IVPB (CHCC)
8.0000 mg | Freq: Once | INTRAVENOUS | Status: AC
  Administered 2014-01-20: 8 mg via INTRAVENOUS

## 2014-01-20 MED ORDER — DIPHENHYDRAMINE HCL 50 MG/ML IJ SOLN
25.0000 mg | Freq: Once | INTRAMUSCULAR | Status: AC
  Administered 2014-01-20: 25 mg via INTRAVENOUS

## 2014-01-20 MED ORDER — HEPARIN SOD (PORK) LOCK FLUSH 100 UNIT/ML IV SOLN
500.0000 [IU] | Freq: Once | INTRAVENOUS | Status: AC | PRN
  Administered 2014-01-20: 500 [IU]
  Filled 2014-01-20: qty 5

## 2014-01-20 MED ORDER — FAMOTIDINE IN NACL 20-0.9 MG/50ML-% IV SOLN
INTRAVENOUS | Status: AC
  Filled 2014-01-20: qty 50

## 2014-01-20 NOTE — Patient Instructions (Signed)
[facility] Discharge Instructions for Patients Receiving Chemotherapy  Today you received the following chemotherapy agents: Taxol  To help prevent nausea and vomiting after your treatment, we encourage you to take your nausea medication as prescribed by your physician.  If you develop nausea and vomiting that is not controlled by your nausea medication, call the clinic.   BELOW ARE SYMPTOMS THAT SHOULD BE REPORTED IMMEDIATELY:  *FEVER GREATER THAN 100.5 F  *CHILLS WITH OR WITHOUT FEVER  NAUSEA AND VOMITING THAT IS NOT CONTROLLED WITH YOUR NAUSEA MEDICATION  *UNUSUAL SHORTNESS OF BREATH  *UNUSUAL BRUISING OR BLEEDING  TENDERNESS IN MOUTH AND THROAT WITH OR WITHOUT PRESENCE OF ULCERS  *URINARY PROBLEMS  *BOWEL PROBLEMS  UNUSUAL RASH Items with * indicate a potential emergency and should be followed up as soon as possible.  Feel free to call the clinic you have any questions or concerns. The clinic phone number is [phone].    

## 2014-01-27 ENCOUNTER — Other Ambulatory Visit (HOSPITAL_BASED_OUTPATIENT_CLINIC_OR_DEPARTMENT_OTHER): Payer: BC Managed Care – PPO

## 2014-01-27 ENCOUNTER — Ambulatory Visit (HOSPITAL_BASED_OUTPATIENT_CLINIC_OR_DEPARTMENT_OTHER): Payer: BC Managed Care – PPO

## 2014-01-27 VITALS — BP 122/69 | HR 70 | Temp 99.0°F | Resp 19 | Ht 66.0 in

## 2014-01-27 DIAGNOSIS — C50419 Malignant neoplasm of upper-outer quadrant of unspecified female breast: Secondary | ICD-10-CM

## 2014-01-27 DIAGNOSIS — C50411 Malignant neoplasm of upper-outer quadrant of right female breast: Secondary | ICD-10-CM

## 2014-01-27 DIAGNOSIS — Z5112 Encounter for antineoplastic immunotherapy: Secondary | ICD-10-CM

## 2014-01-27 DIAGNOSIS — Z5111 Encounter for antineoplastic chemotherapy: Secondary | ICD-10-CM

## 2014-01-27 LAB — CBC WITH DIFFERENTIAL/PLATELET
BASO%: 1.9 % (ref 0.0–2.0)
Basophils Absolute: 0.1 10*3/uL (ref 0.0–0.1)
EOS%: 4.1 % (ref 0.0–7.0)
Eosinophils Absolute: 0.2 10*3/uL (ref 0.0–0.5)
HCT: 36.7 % (ref 34.8–46.6)
HEMOGLOBIN: 12.7 g/dL (ref 11.6–15.9)
LYMPH#: 2 10*3/uL (ref 0.9–3.3)
LYMPH%: 33.1 % (ref 14.0–49.7)
MCH: 31.4 pg (ref 25.1–34.0)
MCHC: 34.7 g/dL (ref 31.5–36.0)
MCV: 90.6 fL (ref 79.5–101.0)
MONO#: 0.4 10*3/uL (ref 0.1–0.9)
MONO%: 7.2 % (ref 0.0–14.0)
NEUT#: 3.3 10*3/uL (ref 1.5–6.5)
NEUT%: 53.7 % (ref 38.4–76.8)
Platelets: 284 10*3/uL (ref 145–400)
RBC: 4.05 10*6/uL (ref 3.70–5.45)
RDW: 14.5 % (ref 11.2–14.5)
WBC: 6.1 10*3/uL (ref 3.9–10.3)

## 2014-01-27 MED ORDER — ONDANSETRON 8 MG/50ML IVPB (CHCC)
8.0000 mg | Freq: Once | INTRAVENOUS | Status: AC
  Administered 2014-01-27: 8 mg via INTRAVENOUS

## 2014-01-27 MED ORDER — DEXAMETHASONE SODIUM PHOSPHATE 10 MG/ML IJ SOLN
INTRAMUSCULAR | Status: AC
  Filled 2014-01-27: qty 1

## 2014-01-27 MED ORDER — ACETAMINOPHEN 325 MG PO TABS
ORAL_TABLET | ORAL | Status: AC
  Filled 2014-01-27: qty 2

## 2014-01-27 MED ORDER — SODIUM CHLORIDE 0.9 % IJ SOLN
10.0000 mL | INTRAMUSCULAR | Status: DC | PRN
  Administered 2014-01-27: 10 mL
  Filled 2014-01-27: qty 10

## 2014-01-27 MED ORDER — SODIUM CHLORIDE 0.9 % IV SOLN
Freq: Once | INTRAVENOUS | Status: AC
  Administered 2014-01-27: 11:00:00 via INTRAVENOUS

## 2014-01-27 MED ORDER — DIPHENHYDRAMINE HCL 50 MG/ML IJ SOLN
25.0000 mg | Freq: Once | INTRAMUSCULAR | Status: AC
  Administered 2014-01-27: 11:00:00 via INTRAVENOUS

## 2014-01-27 MED ORDER — SODIUM CHLORIDE 0.9 % IV SOLN
80.0000 mg/m2 | Freq: Once | INTRAVENOUS | Status: AC
  Administered 2014-01-27: 144 mg via INTRAVENOUS
  Filled 2014-01-27: qty 24

## 2014-01-27 MED ORDER — DEXAMETHASONE SODIUM PHOSPHATE 10 MG/ML IJ SOLN
10.0000 mg | Freq: Once | INTRAMUSCULAR | Status: AC
  Administered 2014-01-27: 10 mg via INTRAVENOUS

## 2014-01-27 MED ORDER — FAMOTIDINE IN NACL 20-0.9 MG/50ML-% IV SOLN
20.0000 mg | Freq: Once | INTRAVENOUS | Status: AC
  Administered 2014-01-27: 20 mg via INTRAVENOUS

## 2014-01-27 MED ORDER — ONDANSETRON 8 MG/NS 50 ML IVPB
INTRAVENOUS | Status: AC
  Filled 2014-01-27: qty 8

## 2014-01-27 MED ORDER — SODIUM CHLORIDE 0.9 % IV SOLN
4.0000 mg/kg | Freq: Once | INTRAVENOUS | Status: AC
  Administered 2014-01-27: 273 mg via INTRAVENOUS
  Filled 2014-01-27: qty 13

## 2014-01-27 MED ORDER — DIPHENHYDRAMINE HCL 50 MG/ML IJ SOLN
INTRAMUSCULAR | Status: AC
  Filled 2014-01-27: qty 1

## 2014-01-27 MED ORDER — ACETAMINOPHEN 325 MG PO TABS
650.0000 mg | ORAL_TABLET | Freq: Once | ORAL | Status: AC
  Administered 2014-01-27: 650 mg via ORAL

## 2014-01-27 MED ORDER — HEPARIN SOD (PORK) LOCK FLUSH 100 UNIT/ML IV SOLN
500.0000 [IU] | Freq: Once | INTRAVENOUS | Status: AC | PRN
  Administered 2014-01-27: 500 [IU]
  Filled 2014-01-27: qty 5

## 2014-01-27 MED ORDER — FAMOTIDINE IN NACL 20-0.9 MG/50ML-% IV SOLN
INTRAVENOUS | Status: AC
  Filled 2014-01-27: qty 50

## 2014-01-27 NOTE — Patient Instructions (Signed)
[facility] Discharge Instructions for Patients Receiving Chemotherapy  Today you received the following chemotherapy agents: Taxol and Herceptin.   To help prevent nausea and vomiting after your treatment, we encourage you to take your nausea medication as directed.   If you develop nausea and vomiting that is not controlled by your nausea medication, call the clinic.   BELOW ARE SYMPTOMS THAT SHOULD BE REPORTED IMMEDIATELY:  *FEVER GREATER THAN 100.5 F  *CHILLS WITH OR WITHOUT FEVER  NAUSEA AND VOMITING THAT IS NOT CONTROLLED WITH YOUR NAUSEA MEDICATION  *UNUSUAL SHORTNESS OF BREATH  *UNUSUAL BRUISING OR BLEEDING  TENDERNESS IN MOUTH AND THROAT WITH OR WITHOUT PRESENCE OF ULCERS  *URINARY PROBLEMS  *BOWEL PROBLEMS  UNUSUAL RASH Items with * indicate a potential emergency and should be followed up as soon as possible.  Feel free to call the clinic you have any questions or concerns. The clinic phone number is [phone].  

## 2014-02-10 ENCOUNTER — Ambulatory Visit (HOSPITAL_BASED_OUTPATIENT_CLINIC_OR_DEPARTMENT_OTHER): Payer: BC Managed Care – PPO

## 2014-02-10 ENCOUNTER — Telehealth: Payer: Self-pay | Admitting: *Deleted

## 2014-02-10 ENCOUNTER — Ambulatory Visit (HOSPITAL_BASED_OUTPATIENT_CLINIC_OR_DEPARTMENT_OTHER): Payer: BC Managed Care – PPO | Admitting: Physician Assistant

## 2014-02-10 ENCOUNTER — Other Ambulatory Visit (HOSPITAL_BASED_OUTPATIENT_CLINIC_OR_DEPARTMENT_OTHER): Payer: BC Managed Care – PPO

## 2014-02-10 ENCOUNTER — Encounter: Payer: Self-pay | Admitting: Physician Assistant

## 2014-02-10 VITALS — BP 130/67 | HR 74 | Temp 98.3°F | Resp 18 | Ht 66.0 in | Wt 161.7 lb

## 2014-02-10 DIAGNOSIS — C50411 Malignant neoplasm of upper-outer quadrant of right female breast: Secondary | ICD-10-CM

## 2014-02-10 DIAGNOSIS — C50419 Malignant neoplasm of upper-outer quadrant of unspecified female breast: Secondary | ICD-10-CM

## 2014-02-10 DIAGNOSIS — Z5112 Encounter for antineoplastic immunotherapy: Secondary | ICD-10-CM

## 2014-02-10 DIAGNOSIS — E063 Autoimmune thyroiditis: Secondary | ICD-10-CM

## 2014-02-10 LAB — CBC WITH DIFFERENTIAL/PLATELET
BASO%: 1.5 % (ref 0.0–2.0)
Basophils Absolute: 0.1 10*3/uL (ref 0.0–0.1)
EOS%: 3.6 % (ref 0.0–7.0)
Eosinophils Absolute: 0.3 10*3/uL (ref 0.0–0.5)
HCT: 38.2 % (ref 34.8–46.6)
HEMOGLOBIN: 12.9 g/dL (ref 11.6–15.9)
LYMPH%: 23.1 % (ref 14.0–49.7)
MCH: 30.7 pg (ref 25.1–34.0)
MCHC: 33.8 g/dL (ref 31.5–36.0)
MCV: 90.7 fL (ref 79.5–101.0)
MONO#: 0.6 10*3/uL (ref 0.1–0.9)
MONO%: 8.2 % (ref 0.0–14.0)
NEUT#: 4.6 10*3/uL (ref 1.5–6.5)
NEUT%: 63.6 % (ref 38.4–76.8)
Platelets: 310 10*3/uL (ref 145–400)
RBC: 4.21 10*6/uL (ref 3.70–5.45)
RDW: 14.8 % — ABNORMAL HIGH (ref 11.2–14.5)
WBC: 7.2 10*3/uL (ref 3.9–10.3)
lymph#: 1.7 10*3/uL (ref 0.9–3.3)

## 2014-02-10 LAB — COMPREHENSIVE METABOLIC PANEL (CC13)
ALT: 32 U/L (ref 0–55)
ANION GAP: 10 meq/L (ref 3–11)
AST: 19 U/L (ref 5–34)
Albumin: 3.6 g/dL (ref 3.5–5.0)
Alkaline Phosphatase: 86 U/L (ref 40–150)
BILIRUBIN TOTAL: 0.33 mg/dL (ref 0.20–1.20)
BUN: 21.2 mg/dL (ref 7.0–26.0)
CHLORIDE: 105 meq/L (ref 98–109)
CO2: 26 meq/L (ref 22–29)
CREATININE: 0.9 mg/dL (ref 0.6–1.1)
Calcium: 9.7 mg/dL (ref 8.4–10.4)
GLUCOSE: 97 mg/dL (ref 70–140)
Potassium: 4.4 mEq/L (ref 3.5–5.1)
Sodium: 141 mEq/L (ref 136–145)
Total Protein: 6.9 g/dL (ref 6.4–8.3)

## 2014-02-10 MED ORDER — SODIUM CHLORIDE 0.9 % IV SOLN
Freq: Once | INTRAVENOUS | Status: AC
  Administered 2014-02-10: 12:00:00 via INTRAVENOUS

## 2014-02-10 MED ORDER — ONDANSETRON 8 MG/NS 50 ML IVPB
INTRAVENOUS | Status: AC
  Filled 2014-02-10: qty 8

## 2014-02-10 MED ORDER — DIPHENHYDRAMINE HCL 50 MG/ML IJ SOLN
INTRAMUSCULAR | Status: AC
  Filled 2014-02-10: qty 1

## 2014-02-10 MED ORDER — HEPARIN SOD (PORK) LOCK FLUSH 100 UNIT/ML IV SOLN
500.0000 [IU] | Freq: Once | INTRAVENOUS | Status: AC | PRN
  Administered 2014-02-10: 500 [IU]
  Filled 2014-02-10: qty 5

## 2014-02-10 MED ORDER — ACETAMINOPHEN 325 MG PO TABS
ORAL_TABLET | ORAL | Status: AC
  Filled 2014-02-10: qty 2

## 2014-02-10 MED ORDER — TRASTUZUMAB CHEMO INJECTION 440 MG
4.0000 mg/kg | Freq: Once | INTRAVENOUS | Status: AC
  Administered 2014-02-10: 273 mg via INTRAVENOUS
  Filled 2014-02-10: qty 13

## 2014-02-10 MED ORDER — DEXAMETHASONE SODIUM PHOSPHATE 10 MG/ML IJ SOLN
INTRAMUSCULAR | Status: AC
  Filled 2014-02-10: qty 1

## 2014-02-10 MED ORDER — ACETAMINOPHEN 325 MG PO TABS
650.0000 mg | ORAL_TABLET | Freq: Once | ORAL | Status: AC
  Administered 2014-02-10: 650 mg via ORAL

## 2014-02-10 MED ORDER — SODIUM CHLORIDE 0.9 % IJ SOLN
10.0000 mL | INTRAMUSCULAR | Status: DC | PRN
  Administered 2014-02-10: 10 mL
  Filled 2014-02-10: qty 10

## 2014-02-10 MED ORDER — FAMOTIDINE IN NACL 20-0.9 MG/50ML-% IV SOLN
20.0000 mg | Freq: Once | INTRAVENOUS | Status: AC
  Administered 2014-02-10: 20 mg via INTRAVENOUS

## 2014-02-10 MED ORDER — DIPHENHYDRAMINE HCL 50 MG/ML IJ SOLN
25.0000 mg | Freq: Once | INTRAMUSCULAR | Status: AC
  Administered 2014-02-10: 25 mg via INTRAVENOUS

## 2014-02-10 MED ORDER — PACLITAXEL CHEMO INJECTION 300 MG/50ML
80.0000 mg/m2 | Freq: Once | INTRAVENOUS | Status: AC
  Administered 2014-02-10: 144 mg via INTRAVENOUS
  Filled 2014-02-10: qty 24

## 2014-02-10 MED ORDER — DEXAMETHASONE SODIUM PHOSPHATE 10 MG/ML IJ SOLN
10.0000 mg | Freq: Once | INTRAMUSCULAR | Status: AC
  Administered 2014-02-10: 10 mg via INTRAVENOUS

## 2014-02-10 MED ORDER — ONDANSETRON 8 MG/50ML IVPB (CHCC)
8.0000 mg | Freq: Once | INTRAVENOUS | Status: AC
  Administered 2014-02-10: 8 mg via INTRAVENOUS

## 2014-02-10 NOTE — Telephone Encounter (Signed)
Per patient request I have moved appts  

## 2014-02-10 NOTE — Progress Notes (Signed)
ID: Barbara [name] OB: [birth year]  MR#: [id number]  BWL#:[id number]  PCP: [name], MD GYN:  Felipa Emory SU: Star Age OTHER GO:TLXBWI [name]  CHIEF COMPLAINT: Right Breast Cancer/adjuvant chemotherapy  HISTORY OF PRESENT ILLNESS: The patient has a very strong family history for breast cancer, although she has tested  negative for the BRCA mutations (as have 2 of her sisters). She had been receiving breast MRI in addition to mammography, but had not had this test performed since October of 2010. More recently, 07/22/2013, routine screening mammography with tomography at [facility] showed no worrisome findings in the setting of extremely dense breasts.  MRI of the breast 09/08/2013, however, showed in the right upper outer quadrant an irregular mass measuring 1.7 cm. This was new as compared to the prior MRI from 2010. There were no abnormal lymph nodes and no other areas of concern in either breast. 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 The MIB-1 was 25%.  The patient's subsequent history is as detailed below  INTERVAL HISTORY: Alaia returns today accompanied by her husband Ed for followup of her right breast cancer.  She continues to receive paclitaxel/trastuzumab, with the paclitaxel given on days 1, 8, and 15 and trastuzumab given on days 1 and 15 of each 28 day cycle. She is due for day 1 cycle 3 today consisting of both agents.   Wave is tolerating this treatment regimen extremely well, and in fact had no significant complaints today. She had one brief episode of tingling and shaking/trembling in her fingers last week but tells me it was minimal and has resolved. She still able to perform all fine motor skills, enjoys gardening, and has noted no effect on her day-to-day  activities.   REVIEW OF SYSTEMS: Fahmida has had no fevers, chills, or night sweats.  She denies any rashes or skin changes and has had no abnormal bruising or bleeding. She's had no mouth ulcers or oral sensitivity and is eating well. She has occasional nausea, although she tells me it is "rare", and finds relief with the prochlorperazine. She has had no emesis, and she denies any change in bowel or bladder habits. She's had no cough, increased shortness of breath, orthopnea, peripheral swelling, chest pain, or palpitations. She's had no abnormal headaches, change in vision, or dizziness. She has chronic seasonal allergies for which she is using Flonase affectively. She continues to deny any unusual myalgias, arthralgias, or bony pain.   A detailed review of systems is otherwise stable and noncontributory.    PAST MEDICAL HISTORY: Past Medical History  Diagnosis Date  . Breast cancer   . Hashimoto's thyroiditis     with goiter  . Hypercholesteremia   . 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  Narrowing of the 4th and 5th lumbar interspaces.   Marland Kitchen Spina bifida     occut[place] L5  . Anxiety   . GERD (gastroesophageal reflux disease)   . Headache(784.0)     hx migraines    PAST SURGICAL HISTORY: Past Surgical History  Procedure Laterality Date  . Hernia repair    . Tubal ligation    . Cystoscopy with urethral caruncle    . Mastectomy w/ sentinel node biopsy Bilateral 10/13/2013    Procedure:  BILATERAL MASTECTOMY  WITH RIGHT SENTINEL LYMPH NODE BIOPSY;  Surgeon: Merrie Roof, MD;  Location: [facility];  Service: General;  Laterality: Bilateral;  . Portacath placement Left 10/13/2013    Procedure: INSERTION PORT-A-CATH;  Surgeon: Merrie Roof, MD;  Location: [facility];  Service: General;  Laterality: Left;  . Breast reconstruction with placement of tissue expander and flex hd (acellular hydrated dermis) Bilateral 10/13/2013    Procedure:  PLACEMENT OF BILATERAL TISSUE EXPANDER FOR BREAST RECONSTRUCTION ;  Surgeon: Crissie Reese, MD;  Location: [facility];  Service: Plastics;  Laterality: Bilateral;    FAMILY HISTORY Family History  Problem Relation Age of Onset  . Breast cancer Mother 63  . Lung cancer Father   . Breast cancer Sister 68    Bilateral breast cancer with 2nd dx at 68  . Breast cancer Maternal Aunt 52  . Breast cancer Sister 61  . Lung cancer Maternal Uncle   . Leukemia Paternal Uncle   . Melanoma Brother 52    on ear  . Prostate cancer Brother 16  . Cancer Cousin     maternal cousin with cancer - NOS  The patient's mother was diagnosed with breast cancer the age of 86. She died at the age of 68. The patient's father died at the age of 66, with a history of lung cancer. The patient had 2 brothers, and 2 sisters. The patient's sister [name] has a history of bilateral breast cancer in her sister [name] a history of left breast cancer. Both have been tested and are found to be negative for a BRCA one or 2 mutation. The patient's mother's sister also had a history of breast cancer. The patient herself has been tested for the BRCA mutation as well as through [facility]. No mutation has been file.   GYNECOLOGIC HISTORY:  Menarche age 38, first live birth age 76. The patient is GX P2. She stopped having periods in her early 35s. She did not take hormone replacement.  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 Her husband Ed used to work for [employer]. Daughter [name] works as a Music therapist in [place]. Son [name] is a drug representative for [employer] (diabetes). His wife, and December, is a Ship broker in [place] emergency room's. The patient has 5 grandchildren. She at tends [place]    ADVANCED DIRECTIVES:  In place   HEALTH MAINTENANCE:  (Updated 02/10/2014) History  Substance Use Topics  .  Smoking status: Never Smoker   . Smokeless tobacco: Never Used  . Alcohol Use: Yes     Comment: rarely     Colonoscopy: 2013/ [place]  PAP: 2013/Dr. Sabra Heck  Bone density: 2013/osteopenia  Lipid panel: Not on file/Dr. Laurann Montana   Allergies  Allergen Reactions  . Buprenex [Buprenorphine] Nausea And Vomiting and Other (See Comments)    Oversedation  . Simvastatin Other (See Comments)    Myalgias   . Codeine Hives, Itching and Rash    Current Outpatient Prescriptions  Medication Sig Dispense Refill  . aspirin 81 MG tablet Take 81 mg by mouth daily.      Marland Kitchen atorvastatin (LIPITOR) 10 MG tablet Take 10 mg by mouth at bedtime.       . Calcium-Vitamin D-Vitamin K (VIACTIV) [id number] MG-UNT-MCG CHEW Chew 1 tablet by mouth.       . Cholecalciferol (D3-1000) 1000 UNITS tablet Take 1,000 Units by mouth daily.      Marland Kitchen  fluticasone (FLONASE) 50 MCG/ACT nasal spray Place 2 sprays into both nostrils daily.      Marland Kitchen levothyroxine (SYNTHROID, LEVOTHROID) 50 MCG tablet Take 50 mcg by mouth daily before breakfast. Take 50 mcg 1 hours before breakfast.      . lidocaine-prilocaine (EMLA) cream Apply 1 application topically as needed.  30 g  0  . Nutritional Supplements (JUICE PLUS FIBRE) LIQD Take by mouth.      Marland Kitchen PARoxetine (PAXIL) 20 MG tablet Take 20 mg by mouth at bedtime.       Marland Kitchen LORazepam (ATIVAN) 0.5 MG tablet       . methocarbamol (ROBAXIN) 500 MG tablet as needed.      . prochlorperazine (COMPAZINE) 10 MG tablet        No current facility-administered medications for this visit.   Facility-Administered Medications Ordered in Other Visits  Medication Dose Route Frequency Provider Last Rate Last Dose  . sodium chloride 0.9 % injection 10 mL  10 mL Intracatheter PRN Tristine Langi Milda Smart, PA-C   10 mL at 02/10/14 1511    OBJECTIVE:  Middle-aged white woman who appears comfortable and is in no acute distress Filed Vitals:   02/10/14 1040  BP: 130/67  Pulse: 74  Temp: 98.3 F (36.8 C)  Resp: 18   Body mass index is 26.11 kg/(m^2).   ECOG:  0 Filed Weights   02/10/14 1040  Weight: 161 lb 11.2 oz (73.347 kg)   Physical Exam: HEENT:  Sclerae anicteric.  Oropharynx clear and moist.  No ulcerations or candidiasis. Neck is supple. Trachea midline. No thyromegaly. NODES:  No cervical or supraclavicular lymphadenopathy palpated.  BREAST EXAM: Deferred. Axillae are benign bilaterally, with no palpable lymphadenopathy. LUNGS:  Clear to auscultation bilaterally with good excursion. No dullness to percussion. No wheezes or rhonchi HEART:  Regular rate and rhythm. No murmur appreciated ABDOMEN:  Soft, nontender. No hepatomegaly. Positive bowel sounds.  MSK:  No focal spinal tenderness to palpation. Good range of motion bilaterally in the upper extremities. EXTREMITIES:  No peripheral edema.  No lymphedema noted in the upper extremities. SKIN:  Benign with no visible rashes or skin lesions. No nail dyscrasia. No pallor. No ecchymoses or petechiae. NEURO:  Nonfocal. Well oriented. Positive affect.     LAB RESULTS:   Lab Results  Component Value Date   WBC 7.2 02/10/2014   NEUTROABS 4.6 02/10/2014   HGB 12.9 02/10/2014   HCT 38.2 02/10/2014   MCV 90.7 02/10/2014   PLT 310 02/10/2014      Chemistry      Component Value Date/Time   NA 141 02/10/2014 1022   NA 138 10/12/2013 0940   K 4.4 02/10/2014 1022   K 4.3 10/12/2013 0940   CL 102 10/12/2013 0940   CO2 26 02/10/2014 1022   CO2 27 10/12/2013 0940   BUN 21.2 02/10/2014 1022   BUN 18 10/12/2013 0940   CREATININE 0.9 02/10/2014 1022   CREATININE 0.91 10/12/2013 0940      Component Value Date/Time   CALCIUM 9.7 02/10/2014 1022   CALCIUM 9.7 10/12/2013 0940   ALKPHOS 86 02/10/2014 1022   AST 19 02/10/2014 1022   ALT 32 02/10/2014 1022   BILITOT 0.33 02/10/2014 1022      STUDIES:  Most recent echocardiogram on 12/06/2013 showed an ejection fraction of 55-60%. (This is scheduled to be repeated on 03/03/2014.)    ASSESSMENT: 63  y.o. BRCA negative [place] woman   (1)  status post right breast biopsy 09/21/2013  for a clinical T1c N0, stage IA invasive ductal carcinoma, grade 2, triple positive, with a HER-2: CED 17 ratio of 5.55 and an average copy number per cell of  13.05. The MIB-1-1 was 25%  (2) status post bilateral mastectomies 10/13/2013 showing:  (a) on the left, atypical ductal hyperplasia  (b) on the right, a pT1c pN0, stage IA invasive ductal carcinoma, grade 2  (3) currently being treated with paclitaxel/trastuzumab, with paclitaxel given on days 1, 8, and 15 and trastuzumab given every 2 weeks on days 1 and 15 of each 28 day cycle. First dose was given on 12/15/2012.   (4) The patient participated in the Star study remotely, completing 5 years of tamoxifen in the late 1990s   PLAN: Mylan continues to tolerate treatment well and will proceed to treatment today as scheduled for day 1 cycle 3, both paclitaxel and trastuzumab. As before, since she is tolerating treatment so well, we have agreed to see her only on day 1 of each cycle. Accordingly, she will return on March 27 and April 3 for labs and treatment alone. She'll followup with her surgeon on April 7, and is already scheduled for repeat echocardiogram on April 10. Breast will return to see Dr. Jana Hakim on April 17 in anticipation of day 1 cycle 4 of therapy. She has been scheduled out through the end of cycle 4 at this time.    Of course we are glad to see her in between the scheduled visits should any problems arise, and she is aware that she can call at anytime to make an appointment.  All the above was reviewed with Cherae and her husband today, both of whom voice understanding and agreement with this plan.   Randal Goens, PA-C   02/10/2014 5:12 PM

## 2014-02-10 NOTE — Telephone Encounter (Signed)
Per staff message and POF I have scheduled appts.  JMW  

## 2014-02-13 ENCOUNTER — Telehealth: Payer: Self-pay | Admitting: Oncology

## 2014-02-13 NOTE — Telephone Encounter (Signed)
added lab to 5/1 inf. all other appts per 3/20 pof already on schedule. pt will get new schduled 3/27.

## 2014-02-17 ENCOUNTER — Other Ambulatory Visit (HOSPITAL_BASED_OUTPATIENT_CLINIC_OR_DEPARTMENT_OTHER): Payer: BC Managed Care – PPO

## 2014-02-17 ENCOUNTER — Ambulatory Visit (HOSPITAL_BASED_OUTPATIENT_CLINIC_OR_DEPARTMENT_OTHER): Payer: BC Managed Care – PPO

## 2014-02-17 VITALS — BP 144/66 | HR 79 | Temp 98.0°F | Resp 20

## 2014-02-17 DIAGNOSIS — C50411 Malignant neoplasm of upper-outer quadrant of right female breast: Secondary | ICD-10-CM

## 2014-02-17 DIAGNOSIS — C50419 Malignant neoplasm of upper-outer quadrant of unspecified female breast: Secondary | ICD-10-CM

## 2014-02-17 DIAGNOSIS — Z5111 Encounter for antineoplastic chemotherapy: Secondary | ICD-10-CM

## 2014-02-17 LAB — CBC WITH DIFFERENTIAL/PLATELET
BASO%: 2.1 % — ABNORMAL HIGH (ref 0.0–2.0)
BASOS ABS: 0.2 10*3/uL — AB (ref 0.0–0.1)
EOS%: 4.5 % (ref 0.0–7.0)
Eosinophils Absolute: 0.3 10*3/uL (ref 0.0–0.5)
HCT: 38.4 % (ref 34.8–46.6)
HEMOGLOBIN: 12.9 g/dL (ref 11.6–15.9)
LYMPH%: 32.7 % (ref 14.0–49.7)
MCH: 30.6 pg (ref 25.1–34.0)
MCHC: 33.6 g/dL (ref 31.5–36.0)
MCV: 91.1 fL (ref 79.5–101.0)
MONO#: 0.5 10*3/uL (ref 0.1–0.9)
MONO%: 6.4 % (ref 0.0–14.0)
NEUT#: 3.9 10*3/uL (ref 1.5–6.5)
NEUT%: 54.3 % (ref 38.4–76.8)
PLATELETS: 295 10*3/uL (ref 145–400)
RBC: 4.22 10*6/uL (ref 3.70–5.45)
RDW: 14.6 % — ABNORMAL HIGH (ref 11.2–14.5)
WBC: 7.2 10*3/uL (ref 3.9–10.3)
lymph#: 2.4 10*3/uL (ref 0.9–3.3)

## 2014-02-17 MED ORDER — SODIUM CHLORIDE 0.9 % IV SOLN
80.0000 mg/m2 | Freq: Once | INTRAVENOUS | Status: AC
  Administered 2014-02-17: 144 mg via INTRAVENOUS
  Filled 2014-02-17: qty 24

## 2014-02-17 MED ORDER — ONDANSETRON 8 MG/50ML IVPB (CHCC)
8.0000 mg | Freq: Once | INTRAVENOUS | Status: AC
  Administered 2014-02-17: 8 mg via INTRAVENOUS

## 2014-02-17 MED ORDER — DIPHENHYDRAMINE HCL 50 MG/ML IJ SOLN
INTRAMUSCULAR | Status: AC
  Filled 2014-02-17: qty 1

## 2014-02-17 MED ORDER — HEPARIN SOD (PORK) LOCK FLUSH 100 UNIT/ML IV SOLN
500.0000 [IU] | Freq: Once | INTRAVENOUS | Status: AC | PRN
  Administered 2014-02-17: 500 [IU]
  Filled 2014-02-17: qty 5

## 2014-02-17 MED ORDER — ONDANSETRON 8 MG/NS 50 ML IVPB
INTRAVENOUS | Status: AC
  Filled 2014-02-17: qty 8

## 2014-02-17 MED ORDER — FAMOTIDINE IN NACL 20-0.9 MG/50ML-% IV SOLN
20.0000 mg | Freq: Once | INTRAVENOUS | Status: AC
  Administered 2014-02-17: 20 mg via INTRAVENOUS

## 2014-02-17 MED ORDER — DEXAMETHASONE SODIUM PHOSPHATE 10 MG/ML IJ SOLN
10.0000 mg | Freq: Once | INTRAMUSCULAR | Status: AC
  Administered 2014-02-17: 10 mg via INTRAVENOUS

## 2014-02-17 MED ORDER — SODIUM CHLORIDE 0.9 % IV SOLN
Freq: Once | INTRAVENOUS | Status: AC
  Administered 2014-02-17: 10:00:00 via INTRAVENOUS

## 2014-02-17 MED ORDER — DEXAMETHASONE SODIUM PHOSPHATE 10 MG/ML IJ SOLN
INTRAMUSCULAR | Status: AC
  Filled 2014-02-17: qty 1

## 2014-02-17 MED ORDER — FAMOTIDINE IN NACL 20-0.9 MG/50ML-% IV SOLN
INTRAVENOUS | Status: AC
  Filled 2014-02-17: qty 50

## 2014-02-17 MED ORDER — DIPHENHYDRAMINE HCL 50 MG/ML IJ SOLN
25.0000 mg | Freq: Once | INTRAMUSCULAR | Status: AC
  Administered 2014-02-17: 25 mg via INTRAVENOUS

## 2014-02-17 MED ORDER — SODIUM CHLORIDE 0.9 % IJ SOLN
10.0000 mL | INTRAMUSCULAR | Status: DC | PRN
  Administered 2014-02-17: 10 mL
  Filled 2014-02-17: qty 10

## 2014-02-17 NOTE — Patient Instructions (Signed)
[facility] Discharge Instructions for Patients Receiving Chemotherapy  Today you received the following chemotherapy agents:  Taxol  To help prevent nausea and vomiting after your treatment, we encourage you to take your nausea medication as ordered per MD.   If you develop nausea and vomiting that is not controlled by your nausea medication, call the clinic.   BELOW ARE SYMPTOMS THAT SHOULD BE REPORTED IMMEDIATELY:  *FEVER GREATER THAN 100.5 F  *CHILLS WITH OR WITHOUT FEVER  NAUSEA AND VOMITING THAT IS NOT CONTROLLED WITH YOUR NAUSEA MEDICATION  *UNUSUAL SHORTNESS OF BREATH  *UNUSUAL BRUISING OR BLEEDING  TENDERNESS IN MOUTH AND THROAT WITH OR WITHOUT PRESENCE OF ULCERS  *URINARY PROBLEMS  *BOWEL PROBLEMS  UNUSUAL RASH Items with * indicate a potential emergency and should be followed up as soon as possible.  Feel free to call the clinic you have any questions or concerns. The clinic phone number is [phone].    

## 2014-02-19 ENCOUNTER — Encounter: Payer: Self-pay | Admitting: Oncology

## 2014-02-23 ENCOUNTER — Encounter: Payer: Self-pay | Admitting: Internal Medicine

## 2014-02-23 NOTE — Progress Notes (Signed)
Insurance is paying herceptin at 100%

## 2014-02-24 ENCOUNTER — Other Ambulatory Visit: Payer: Self-pay | Admitting: Oncology

## 2014-02-24 ENCOUNTER — Ambulatory Visit (HOSPITAL_BASED_OUTPATIENT_CLINIC_OR_DEPARTMENT_OTHER): Payer: BC Managed Care – PPO

## 2014-02-24 ENCOUNTER — Other Ambulatory Visit (HOSPITAL_BASED_OUTPATIENT_CLINIC_OR_DEPARTMENT_OTHER): Payer: BC Managed Care – PPO

## 2014-02-24 VITALS — BP 129/57 | HR 74 | Temp 98.2°F | Resp 18

## 2014-02-24 DIAGNOSIS — C50411 Malignant neoplasm of upper-outer quadrant of right female breast: Secondary | ICD-10-CM

## 2014-02-24 DIAGNOSIS — C50419 Malignant neoplasm of upper-outer quadrant of unspecified female breast: Secondary | ICD-10-CM

## 2014-02-24 DIAGNOSIS — Z5112 Encounter for antineoplastic immunotherapy: Secondary | ICD-10-CM

## 2014-02-24 DIAGNOSIS — Z5111 Encounter for antineoplastic chemotherapy: Secondary | ICD-10-CM

## 2014-02-24 LAB — CBC WITH DIFFERENTIAL/PLATELET
BASO%: 2.6 % — ABNORMAL HIGH (ref 0.0–2.0)
Basophils Absolute: 0.1 10*3/uL (ref 0.0–0.1)
EOS%: 6.2 % (ref 0.0–7.0)
Eosinophils Absolute: 0.3 10*3/uL (ref 0.0–0.5)
HEMATOCRIT: 35.4 % (ref 34.8–46.6)
HEMOGLOBIN: 12.2 g/dL (ref 11.6–15.9)
LYMPH%: 28.7 % (ref 14.0–49.7)
MCH: 31.2 pg (ref 25.1–34.0)
MCHC: 34.4 g/dL (ref 31.5–36.0)
MCV: 90.6 fL (ref 79.5–101.0)
MONO#: 0.4 10*3/uL (ref 0.1–0.9)
MONO%: 7.5 % (ref 0.0–14.0)
NEUT#: 2.8 10*3/uL (ref 1.5–6.5)
NEUT%: 55 % (ref 38.4–76.8)
PLATELETS: 282 10*3/uL (ref 145–400)
RBC: 3.91 10*6/uL (ref 3.70–5.45)
RDW: 14.9 % — ABNORMAL HIGH (ref 11.2–14.5)
WBC: 5.1 10*3/uL (ref 3.9–10.3)
lymph#: 1.5 10*3/uL (ref 0.9–3.3)

## 2014-02-24 LAB — COMPREHENSIVE METABOLIC PANEL (CC13)
ALT: 80 U/L — ABNORMAL HIGH (ref 0–55)
ANION GAP: 9 meq/L (ref 3–11)
AST: 45 U/L — ABNORMAL HIGH (ref 5–34)
Albumin: 3.6 g/dL (ref 3.5–5.0)
Alkaline Phosphatase: 85 U/L (ref 40–150)
BILIRUBIN TOTAL: 0.4 mg/dL (ref 0.20–1.20)
BUN: 25 mg/dL (ref 7.0–26.0)
CALCIUM: 9.3 mg/dL (ref 8.4–10.4)
CO2: 25 mEq/L (ref 22–29)
Chloride: 107 mEq/L (ref 98–109)
Creatinine: 1 mg/dL (ref 0.6–1.1)
GLUCOSE: 88 mg/dL (ref 70–140)
Potassium: 4.3 mEq/L (ref 3.5–5.1)
Sodium: 141 mEq/L (ref 136–145)
TOTAL PROTEIN: 6.6 g/dL (ref 6.4–8.3)

## 2014-02-24 MED ORDER — ONDANSETRON 8 MG/50ML IVPB (CHCC)
8.0000 mg | Freq: Once | INTRAVENOUS | Status: AC
  Administered 2014-02-24: 8 mg via INTRAVENOUS

## 2014-02-24 MED ORDER — DEXAMETHASONE SODIUM PHOSPHATE 10 MG/ML IJ SOLN
10.0000 mg | Freq: Once | INTRAMUSCULAR | Status: AC
  Administered 2014-02-24: 10 mg via INTRAVENOUS

## 2014-02-24 MED ORDER — TRASTUZUMAB CHEMO INJECTION 440 MG
4.0000 mg/kg | Freq: Once | INTRAVENOUS | Status: AC
  Administered 2014-02-24: 273 mg via INTRAVENOUS
  Filled 2014-02-24: qty 13

## 2014-02-24 MED ORDER — SODIUM CHLORIDE 0.9 % IV SOLN
Freq: Once | INTRAVENOUS | Status: AC
  Administered 2014-02-24: 10:00:00 via INTRAVENOUS

## 2014-02-24 MED ORDER — PACLITAXEL CHEMO INJECTION 300 MG/50ML
80.0000 mg/m2 | Freq: Once | INTRAVENOUS | Status: AC
  Administered 2014-02-24: 144 mg via INTRAVENOUS
  Filled 2014-02-24: qty 24

## 2014-02-24 MED ORDER — SODIUM CHLORIDE 0.9 % IJ SOLN
10.0000 mL | INTRAMUSCULAR | Status: DC | PRN
  Administered 2014-02-24: 10 mL
  Filled 2014-02-24: qty 10

## 2014-02-24 MED ORDER — DIPHENHYDRAMINE HCL 50 MG/ML IJ SOLN
INTRAMUSCULAR | Status: AC
Start: 2014-02-24 — End: 2014-02-24
  Filled 2014-02-24: qty 1

## 2014-02-24 MED ORDER — ACETAMINOPHEN 325 MG PO TABS
ORAL_TABLET | ORAL | Status: AC
Start: 2014-02-24 — End: 2014-02-24
  Filled 2014-02-24: qty 2

## 2014-02-24 MED ORDER — DEXAMETHASONE SODIUM PHOSPHATE 10 MG/ML IJ SOLN
INTRAMUSCULAR | Status: AC
  Filled 2014-02-24: qty 1

## 2014-02-24 MED ORDER — ACETAMINOPHEN 325 MG PO TABS
650.0000 mg | ORAL_TABLET | Freq: Once | ORAL | Status: AC
  Administered 2014-02-24: 650 mg via ORAL

## 2014-02-24 MED ORDER — FAMOTIDINE IN NACL 20-0.9 MG/50ML-% IV SOLN
INTRAVENOUS | Status: AC
  Filled 2014-02-24: qty 50

## 2014-02-24 MED ORDER — ONDANSETRON 8 MG/NS 50 ML IVPB
INTRAVENOUS | Status: AC
  Filled 2014-02-24: qty 8

## 2014-02-24 MED ORDER — HEPARIN SOD (PORK) LOCK FLUSH 100 UNIT/ML IV SOLN
500.0000 [IU] | Freq: Once | INTRAVENOUS | Status: AC | PRN
  Administered 2014-02-24: 500 [IU]
  Filled 2014-02-24: qty 5

## 2014-02-24 MED ORDER — FAMOTIDINE IN NACL 20-0.9 MG/50ML-% IV SOLN
20.0000 mg | Freq: Once | INTRAVENOUS | Status: AC
  Administered 2014-02-24: 20 mg via INTRAVENOUS

## 2014-02-24 MED ORDER — DIPHENHYDRAMINE HCL 50 MG/ML IJ SOLN
25.0000 mg | Freq: Once | INTRAMUSCULAR | Status: AC
  Administered 2014-02-24: 25 mg via INTRAVENOUS

## 2014-02-24 NOTE — Patient Instructions (Signed)
[name] Discharge Instructions for Patients Receiving Chemotherapy  Today you received the following chemotherapy agents: Taxol, Herceptin  To help prevent nausea and vomiting after your treatment, we encourage you to take your nausea medication: Compazine 10 mg as needed per MD order.    If you develop nausea and vomiting that is not controlled by your nausea medication, call the clinic.   BELOW ARE SYMPTOMS THAT SHOULD BE REPORTED IMMEDIATELY:  *FEVER GREATER THAN 100.5 F  *CHILLS WITH OR WITHOUT FEVER  NAUSEA AND VOMITING THAT IS NOT CONTROLLED WITH YOUR NAUSEA MEDICATION  *UNUSUAL SHORTNESS OF BREATH  *UNUSUAL BRUISING OR BLEEDING  TENDERNESS IN MOUTH AND THROAT WITH OR WITHOUT PRESENCE OF ULCERS  *URINARY PROBLEMS  *BOWEL PROBLEMS  UNUSUAL RASH Items with * indicate a potential emergency and should be followed up as soon as possible.  Feel free to call the clinic you have any questions or concerns. The clinic phone number is [phone].

## 2014-02-24 NOTE — Progress Notes (Unsigned)
I was asked to check on Barbara [name] because there has been a slight bump in her liver function tests. I went back to the treatment area, and particularly asked her about neuropathy. She has had minimal neuropathy earlier but that has completely resolved and right now she is having new neuropathy symptoms. There have been no changes in medication and she does not drink alcohol. I think a slight bump in her liver function tests does not warrant holding her treatment, but clearly we will have to check on this before her next cycle and she gets a week off at this point so she does not get treated after today until April 17. She really has an appointment with me on that date and we will make sure her liver function tests are rechecked prior to that treatment.

## 2014-02-24 NOTE — Progress Notes (Signed)
1055- Per Dr. Jana Hakim, hold chemo tx until he comes to see patient. Patient will have labs with next treatment 03/10/14 and MD appt; denies any changes in medications or alcohol consumption. After seeing patient and speaking with patient, Dr. Jana Hakim states it is okay to proceed with chemo. Patient voices understanding to report any neuropathy or changes as soon as possible. Infusion completed without difficulty.

## 2014-02-24 NOTE — Progress Notes (Signed)
After review of CMET; OK to treat per Dr. Jana Hakim

## 2014-02-27 ENCOUNTER — Other Ambulatory Visit: Payer: Self-pay

## 2014-02-28 ENCOUNTER — Encounter (INDEPENDENT_AMBULATORY_CARE_PROVIDER_SITE_OTHER): Payer: Self-pay | Admitting: General Surgery

## 2014-02-28 ENCOUNTER — Ambulatory Visit (INDEPENDENT_AMBULATORY_CARE_PROVIDER_SITE_OTHER): Payer: BC Managed Care – PPO | Admitting: General Surgery

## 2014-02-28 VITALS — BP 126/80 | HR 72 | Temp 97.5°F | Ht 66.0 in | Wt 159.0 lb

## 2014-02-28 DIAGNOSIS — C50419 Malignant neoplasm of upper-outer quadrant of unspecified female breast: Secondary | ICD-10-CM

## 2014-02-28 DIAGNOSIS — C50411 Malignant neoplasm of upper-outer quadrant of right female breast: Secondary | ICD-10-CM

## 2014-02-28 NOTE — Patient Instructions (Signed)
Continue regular self exams  

## 2014-02-28 NOTE — Progress Notes (Signed)
Subjective:     Patient ID: Barbara [name], female   DOB: 1951/08/02, 63 y.o.   MRN: [id number]  HPI The patient is a 63 year old white female who is 5 months status post bilateral mastectomies and right sentinel lymph node biopsy for a T1 C. N0 right breast cancer. She was a triple positive. She had reconstruction done by Dr. Harlow Mares. She is currently nearing the end of her chemotherapy regimen. She has tolerated it well. She has no complaints today. She is planning to have her tissue expanders exchange for the implants at the end of June.  Review of Systems  Constitutional: Negative.   HENT: Negative.   Eyes: Negative.   Respiratory: Negative.   Cardiovascular: Negative.   Gastrointestinal: Negative.   Endocrine: Negative.   Genitourinary: Negative.   Musculoskeletal: Negative.   Skin: Negative.   Allergic/Immunologic: Negative.   Neurological: Negative.   Hematological: Negative.   Psychiatric/Behavioral: Negative.        Objective:   Physical Exam  Constitutional: She is oriented to person, place, and time. She appears well-developed and well-nourished.  HENT:  Head: Normocephalic and atraumatic.  Eyes: Conjunctivae and EOM are normal. Pupils are equal, round, and reactive to light.  Neck: Normal range of motion. Neck supple.  Cardiovascular: Normal rate, regular rhythm and normal heart sounds.   Pulmonary/Chest: Effort normal and breath sounds normal.  There is no palpable mass of either reconstructed breast. Her mastectomy incisions are healing nicely. There is no palpable axillary, supraclavicular, or cervical lymphadenopathy  Abdominal: Soft. Bowel sounds are normal.  Musculoskeletal: Normal range of motion.  Lymphadenopathy:    She has no cervical adenopathy.  Neurological: She is alert and oriented to person, place, and time.  Skin: Skin is warm and dry.  Psychiatric: She has a normal mood and affect. Her behavior is normal.       Assessment:     The patient is  5 months status post bilateral mastectomies for a right-sided breast cancer     Plan:     At this point she will finish chemotherapy followed by an estrogen therapy. She will continue to followup with plastic surgery for exchange of the tissue expanders. I will plan to see her back in about 3 months.

## 2014-03-03 ENCOUNTER — Ambulatory Visit (HOSPITAL_COMMUNITY)
Admission: RE | Admit: 2014-03-03 | Discharge: 2014-03-03 | Disposition: A | Discharge status: Home / Self Care | Payer: BC Managed Care – PPO | Source: Ambulatory Visit | Attending: Oncology | Admitting: Oncology

## 2014-03-03 DIAGNOSIS — C50411 Malignant neoplasm of upper-outer quadrant of right female breast: Secondary | ICD-10-CM

## 2014-03-03 DIAGNOSIS — Z0189 Encounter for other specified special examinations: Secondary | ICD-10-CM

## 2014-03-03 DIAGNOSIS — J310 Chronic rhinitis: Secondary | ICD-10-CM

## 2014-03-03 DIAGNOSIS — C50419 Malignant neoplasm of upper-outer quadrant of unspecified female breast: Secondary | ICD-10-CM | POA: Insufficient documentation

## 2014-03-03 DIAGNOSIS — Z79899 Other long term (current) drug therapy: Secondary | ICD-10-CM | POA: Insufficient documentation

## 2014-03-03 NOTE — Progress Notes (Signed)
Echocardiogram 2D Echocardiogram has been performed.  Barbara [name] 03/03/2014, 10:21 AM

## 2014-03-06 ENCOUNTER — Other Ambulatory Visit (HOSPITAL_COMMUNITY): Payer: BC Managed Care – PPO

## 2014-03-10 ENCOUNTER — Ambulatory Visit (HOSPITAL_BASED_OUTPATIENT_CLINIC_OR_DEPARTMENT_OTHER): Payer: BC Managed Care – PPO | Admitting: Oncology

## 2014-03-10 ENCOUNTER — Other Ambulatory Visit: Payer: Self-pay | Admitting: *Deleted

## 2014-03-10 ENCOUNTER — Telehealth: Payer: Self-pay | Admitting: Oncology

## 2014-03-10 ENCOUNTER — Other Ambulatory Visit (HOSPITAL_BASED_OUTPATIENT_CLINIC_OR_DEPARTMENT_OTHER): Payer: BC Managed Care – PPO

## 2014-03-10 ENCOUNTER — Ambulatory Visit (HOSPITAL_BASED_OUTPATIENT_CLINIC_OR_DEPARTMENT_OTHER): Payer: BC Managed Care – PPO

## 2014-03-10 VITALS — BP 139/77 | HR 70 | Temp 98.8°F | Resp 18 | Ht 66.0 in | Wt 161.4 lb

## 2014-03-10 DIAGNOSIS — Z5112 Encounter for antineoplastic immunotherapy: Secondary | ICD-10-CM

## 2014-03-10 DIAGNOSIS — C50411 Malignant neoplasm of upper-outer quadrant of right female breast: Secondary | ICD-10-CM

## 2014-03-10 DIAGNOSIS — Z170 Estrogen receptor positive status [ER+]: Secondary | ICD-10-CM

## 2014-03-10 DIAGNOSIS — Z5111 Encounter for antineoplastic chemotherapy: Secondary | ICD-10-CM

## 2014-03-10 DIAGNOSIS — C50419 Malignant neoplasm of upper-outer quadrant of unspecified female breast: Secondary | ICD-10-CM

## 2014-03-10 LAB — COMPREHENSIVE METABOLIC PANEL (CC13)
ALT: 28 U/L (ref 0–55)
AST: 18 U/L (ref 5–34)
Albumin: 3.5 g/dL (ref 3.5–5.0)
Alkaline Phosphatase: 81 U/L (ref 40–150)
Anion Gap: 6 mEq/L (ref 3–11)
BUN: 20.6 mg/dL (ref 7.0–26.0)
CALCIUM: 9.3 mg/dL (ref 8.4–10.4)
CHLORIDE: 107 meq/L (ref 98–109)
CO2: 26 meq/L (ref 22–29)
CREATININE: 0.9 mg/dL (ref 0.6–1.1)
GLUCOSE: 83 mg/dL (ref 70–140)
Potassium: 4.9 mEq/L (ref 3.5–5.1)
Sodium: 140 mEq/L (ref 136–145)
Total Bilirubin: 0.3 mg/dL (ref 0.20–1.20)
Total Protein: 6.7 g/dL (ref 6.4–8.3)

## 2014-03-10 LAB — CBC WITH DIFFERENTIAL/PLATELET
BASO%: 1.5 % (ref 0.0–2.0)
BASOS ABS: 0.1 10*3/uL (ref 0.0–0.1)
EOS ABS: 0.2 10*3/uL (ref 0.0–0.5)
EOS%: 4.2 % (ref 0.0–7.0)
HEMATOCRIT: 36.6 % (ref 34.8–46.6)
HEMOGLOBIN: 12.4 g/dL (ref 11.6–15.9)
LYMPH#: 1.8 10*3/uL (ref 0.9–3.3)
LYMPH%: 32.3 % (ref 14.0–49.7)
MCH: 30.8 pg (ref 25.1–34.0)
MCHC: 33.7 g/dL (ref 31.5–36.0)
MCV: 91.3 fL (ref 79.5–101.0)
MONO#: 0.6 10*3/uL (ref 0.1–0.9)
MONO%: 9.9 % (ref 0.0–14.0)
NEUT#: 2.9 10*3/uL (ref 1.5–6.5)
NEUT%: 52.1 % (ref 38.4–76.8)
Platelets: 301 10*3/uL (ref 145–400)
RBC: 4.02 10*6/uL (ref 3.70–5.45)
RDW: 14.9 % — ABNORMAL HIGH (ref 11.2–14.5)
WBC: 5.6 10*3/uL (ref 3.9–10.3)

## 2014-03-10 MED ORDER — SODIUM CHLORIDE 0.9 % IV SOLN
Freq: Once | INTRAVENOUS | Status: AC
  Administered 2014-03-10: 11:00:00 via INTRAVENOUS

## 2014-03-10 MED ORDER — ACETAMINOPHEN 325 MG PO TABS
ORAL_TABLET | ORAL | Status: AC
  Filled 2014-03-10: qty 2

## 2014-03-10 MED ORDER — SODIUM CHLORIDE 0.9 % IV SOLN
4.0000 mg/kg | Freq: Once | INTRAVENOUS | Status: AC
  Administered 2014-03-10: 273 mg via INTRAVENOUS
  Filled 2014-03-10: qty 13

## 2014-03-10 MED ORDER — FAMOTIDINE IN NACL 20-0.9 MG/50ML-% IV SOLN
20.0000 mg | Freq: Once | INTRAVENOUS | Status: AC
  Administered 2014-03-10: 20 mg via INTRAVENOUS

## 2014-03-10 MED ORDER — DEXAMETHASONE SODIUM PHOSPHATE 10 MG/ML IJ SOLN
INTRAMUSCULAR | Status: AC
  Filled 2014-03-10: qty 1

## 2014-03-10 MED ORDER — HEPARIN SOD (PORK) LOCK FLUSH 100 UNIT/ML IV SOLN
500.0000 [IU] | Freq: Once | INTRAVENOUS | Status: AC | PRN
  Administered 2014-03-10: 500 [IU]
  Filled 2014-03-10: qty 5

## 2014-03-10 MED ORDER — SODIUM CHLORIDE 0.9 % IJ SOLN
10.0000 mL | INTRAMUSCULAR | Status: DC | PRN
  Administered 2014-03-10: 10 mL
  Filled 2014-03-10: qty 10

## 2014-03-10 MED ORDER — ONDANSETRON 8 MG/50ML IVPB (CHCC)
8.0000 mg | Freq: Once | INTRAVENOUS | Status: AC
Start: 2014-03-10 — End: 2014-03-10
  Administered 2014-03-10: 8 mg via INTRAVENOUS

## 2014-03-10 MED ORDER — SODIUM CHLORIDE 0.9 % IV SOLN
80.0000 mg/m2 | Freq: Once | INTRAVENOUS | Status: AC
  Administered 2014-03-10: 144 mg via INTRAVENOUS
  Filled 2014-03-10: qty 24

## 2014-03-10 MED ORDER — DIPHENHYDRAMINE HCL 50 MG/ML IJ SOLN
25.0000 mg | Freq: Once | INTRAMUSCULAR | Status: AC
  Administered 2014-03-10: 25 mg via INTRAVENOUS

## 2014-03-10 MED ORDER — DEXAMETHASONE SODIUM PHOSPHATE 10 MG/ML IJ SOLN
10.0000 mg | Freq: Once | INTRAMUSCULAR | Status: AC
  Administered 2014-03-10: 10 mg via INTRAVENOUS

## 2014-03-10 MED ORDER — DIPHENHYDRAMINE HCL 50 MG/ML IJ SOLN
INTRAMUSCULAR | Status: AC
  Filled 2014-03-10: qty 1

## 2014-03-10 MED ORDER — ACETAMINOPHEN 325 MG PO TABS
650.0000 mg | ORAL_TABLET | Freq: Once | ORAL | Status: AC
  Administered 2014-03-10: 650 mg via ORAL

## 2014-03-10 MED ORDER — FAMOTIDINE IN NACL 20-0.9 MG/50ML-% IV SOLN
INTRAVENOUS | Status: AC
  Filled 2014-03-10: qty 50

## 2014-03-10 MED ORDER — ONDANSETRON 8 MG/NS 50 ML IVPB
INTRAVENOUS | Status: AC
  Filled 2014-03-10: qty 8

## 2014-03-10 NOTE — Telephone Encounter (Signed)
sent email to MW to sch chemo for 5/22-awaiting for sch

## 2014-03-10 NOTE — Patient Instructions (Signed)
[facility] Discharge Instructions for Patients Receiving Chemotherapy  Today you received the following chemotherapy agents herceptin/taxol   To help prevent nausea and vomiting after your treatment, we encourage you to take your nausea medication as directed  If you develop nausea and vomiting that is not controlled by your nausea medication, call the clinic.   BELOW ARE SYMPTOMS THAT SHOULD BE REPORTED IMMEDIATELY:  *FEVER GREATER THAN 100.5 F  *CHILLS WITH OR WITHOUT FEVER  NAUSEA AND VOMITING THAT IS NOT CONTROLLED WITH YOUR NAUSEA MEDICATION  *UNUSUAL SHORTNESS OF BREATH  *UNUSUAL BRUISING OR BLEEDING  TENDERNESS IN MOUTH AND THROAT WITH OR WITHOUT PRESENCE OF ULCERS  *URINARY PROBLEMS  *BOWEL PROBLEMS  UNUSUAL RASH Items with * indicate a potential emergency and should be followed up as soon as possible.  Feel free to call the clinic you have any questions or concerns. The clinic phone number is [phone].  

## 2014-03-10 NOTE — Telephone Encounter (Signed)
per pof sch appt-print copy of sch and gave to pt

## 2014-03-10 NOTE — Progress Notes (Signed)
ID: Barbara [name] OB: [birth year]  MR#: [id number]  HKV#:[id number]  PCP: [name], MD GYN:  Felipa Emory SU: Star Age OTHER FI:EPPIRJ Blair Heys  CHIEF COMPLAINT: Right Breast Cancer/adjuvant chemotherapy  HISTORY OF PRESENT ILLNESS: The patient has a very strong family history for breast cancer, although she has tested  negative for the BRCA mutations (as have 2 of her sisters). She had been receiving breast MRI in addition to mammography, but had not had this test performed since October of 2010. More recently, 07/22/2013, routine screening mammography with tomography at [facility] showed no worrisome findings in the setting of extremely dense breasts.  MRI of the breast 09/08/2013, however, showed in the right upper outer quadrant an irregular mass measuring 1.7 cm. This was new as compared to the prior MRI from 2010. There were no abnormal lymph nodes and no other areas of concern in either breast. 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 The MIB-1 was 25%.  The patient's subsequent history is as detailed below  INTERVAL HISTORY: Barbara [name] returns today accompanied by her husband Ed for followup of her right breast cancer.  Today is day 1 cycle 4 of weekly paclitaxel/ trastuzumab. She just had an echocardiogram which shows a well preserved ejection fraction. She continues to work full-time. 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 She denies any symptoms off peripheral neuropathy whatsoever. She has minimal gastritis symptoms occasionally. Otherwise a detailed  review of systems today was entirely negative.   PAST MEDICAL HISTORY: Past Medical History  Diagnosis Date  . Breast cancer   . Hashimoto's thyroiditis     with goiter  . Hypercholesteremia   . 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  Narrowing of the 4th and 5th lumbar interspaces.   Marland Kitchen Spina bifida     occutta L5  . Anxiety   . GERD (gastroesophageal reflux disease)   . Headache(784.0)     hx migraines    PAST SURGICAL HISTORY: Past Surgical History  Procedure Laterality Date  . Hernia repair    . Tubal ligation    . Cystoscopy with urethral caruncle    . Mastectomy w/ sentinel node biopsy Bilateral 10/13/2013    Procedure:  BILATERAL MASTECTOMY WITH RIGHT SENTINEL LYMPH NODE BIOPSY;  Surgeon: Merrie Roof, MD;  Location: [place];  Service: General;  Laterality: Bilateral;  . Portacath placement Left 10/13/2013    Procedure: INSERTION PORT-A-CATH;  Surgeon: Merrie Roof, MD;  Location: [place];  Service: General;  Laterality: Left;  . Breast reconstruction with placement of tissue expander and flex hd (acellular hydrated dermis) Bilateral 10/13/2013    Procedure: PLACEMENT OF BILATERAL TISSUE EXPANDER FOR BREAST RECONSTRUCTION ;  Surgeon: Crissie Reese, MD;  Location: [place];  Service: Plastics;  Laterality: Bilateral;    FAMILY HISTORY Family History  Problem Relation Age of Onset  . Breast cancer Mother 44  . Lung cancer Father   . Breast cancer Sister 74    Bilateral breast cancer with 2nd dx at 85  .  Breast cancer Maternal Aunt 52  . Breast cancer Sister 75  . Lung cancer Maternal Uncle   . Leukemia Paternal Uncle   . Melanoma Brother 52    on ear  . Prostate cancer Brother 63  . Cancer Cousin     maternal cousin with cancer - NOS  The patient's mother was diagnosed with breast cancer the age of 33. She died at the age of 33. The patient's father died at the age of 66, with a history of lung cancer. The patient  had 2 brothers, and 2 sisters. The patient's sister Barbara [name] has a history of bilateral breast cancer in her sister Barbara [name] a history of left breast cancer. Both have been tested and are found to be negative for a BRCA one or 2 mutation. The patient's mother's sister also had a history of breast cancer. The patient herself has been tested for the BRCA mutation as well as through [facility]. No mutation has been file.   GYNECOLOGIC HISTORY:  Menarche age 28, first live birth age 68. The patient is GX P2. She stopped having periods in her early 38s. She did not take hormone replacement.  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 Her husband Ed used to work for [employer]. Daughter Barbara [name] works as a Music therapist in [place]. Son Barbara [name] is a drug representative for [employer] (diabetes). His wife, and December, is a Ship broker in [place] emergency room's. The patient has 5 grandchildren. She at tends [employer]    ADVANCED DIRECTIVES:  In place   HEALTH MAINTENANCE:  (Updated 02/10/2014) History  Substance Use Topics  . Smoking status: Never Smoker   . Smokeless tobacco: Never Used  . Alcohol Use: Yes     Comment: rarely     Colonoscopy: 2013/ [place]  PAP: 2013/Dr. Sabra Heck  Bone density: 2013/osteopenia  Lipid panel: Not on file/Dr. Laurann Montana   Allergies  Allergen Reactions  . Buprenex [Buprenorphine] Nausea And Vomiting and Other (See Comments)    Oversedation  . Simvastatin Other (See Comments)    Myalgias   . Codeine Hives, Itching and Rash    Current Outpatient Prescriptions  Medication Sig Dispense Refill  . aspirin 81 MG tablet Take 81 mg by mouth daily.      Marland Kitchen atorvastatin (LIPITOR) 10 MG tablet Take 10 mg by mouth at bedtime.       . Calcium-Vitamin D-Vitamin K (VIACTIV) 329-924-26 MG-UNT-MCG CHEW Chew 1 tablet by mouth.       . Cholecalciferol (D3-1000) 1000  UNITS tablet Take 1,000 Units by mouth daily.      . fluticasone (FLONASE) 50 MCG/ACT nasal spray Place 2 sprays into both nostrils daily.      Marland Kitchen levothyroxine (SYNTHROID, LEVOTHROID) 50 MCG tablet Take 50 mcg by mouth daily before breakfast. Take 50 mcg 1 hours before breakfast.      . lidocaine-prilocaine (EMLA) cream Apply 1 application topically as needed.  30 g  0  . LORazepam (ATIVAN) 0.5 MG tablet       . methocarbamol (ROBAXIN) 500 MG tablet as needed.      . Nutritional Supplements (JUICE PLUS FIBRE) LIQD Take by mouth.      Marland Kitchen PARoxetine (PAXIL) 20 MG tablet Take 20 mg by mouth at bedtime.       . prochlorperazine (COMPAZINE) 10 MG tablet        No current facility-administered medications for this  visit.    OBJECTIVE:  Middle-aged white woman in no acute distress Filed Vitals:   03/10/14 1009  BP: 139/77  Pulse: 70  Temp: 98.8 F (37.1 C)  Resp: 18  Body mass index is 26.06 kg/(m^2).   ECOG:  0 Filed Weights   03/10/14 1009  Weight: 161 lb 6.4 oz (73.211 kg)   Sclerae unicteric, pupils equal and reactive Oropharynx clear and moist-- no thrush or other lesions No cervical or supraclavicular adenopathy Lungs no rales or rhonchi Heart regular rate and rhythm Abd soft, nontender, positive bowel sounds MSK no focal spinal tenderness, no upper extremity lymphedema Neuro: nonfocal, well oriented, positive affect Breasts: Status post bilateral mastectomies with expander is in place  LAB RESULTS:   Lab Results  Component Value Date   WBC 5.6 03/10/2014   NEUTROABS 2.9 03/10/2014   HGB 12.4 03/10/2014   HCT 36.6 03/10/2014   MCV 91.3 03/10/2014   PLT 301 03/10/2014      Chemistry      Component Value Date/Time   NA 141 02/24/2014 0943   NA 138 10/12/2013 0940   K 4.3 02/24/2014 0943   K 4.3 10/12/2013 0940   CL 102 10/12/2013 0940   CO2 25 02/24/2014 0943   CO2 27 10/12/2013 0940   BUN 25.0 02/24/2014 0943   BUN 18 10/12/2013 0940   CREATININE 1.0 02/24/2014 0943    CREATININE 0.91 10/12/2013 0940      Component Value Date/Time   CALCIUM 9.3 02/24/2014 0943   CALCIUM 9.7 10/12/2013 0940   ALKPHOS 85 02/24/2014 0943   AST 45* 02/24/2014 0943   ALT 80* 02/24/2014 0943   BILITOT 0.40 02/24/2014 0943      STUDIES:  Most recent echocardiogram on  03/03/2014  showed an ejection fraction of 55-60%.    ASSESSMENT: 63 y.o. BRCA negative [place] woman   (1)  status post right breast biopsy 09/21/2013 for a clinical T1c N0, stage IA invasive ductal carcinoma, grade 2, triple positive, with a HER-2: CED 17 ratio of 5.55 and an average copy number per cell of  13.05. The MIB-1-1 was 25%  (2) status post bilateral mastectomies 10/13/2013 showing:  (a) on the left, atypical ductal hyperplasia  (b) on the right, a pT1c pN0, stage IA invasive ductal carcinoma, grade 2  (3) currently being treated with paclitaxel/trastuzumab, with paclitaxel given on days 1, 8, and 15 and trastuzumab given every 2 weeks on days 1 and 15 of each 28 day cycle. First dose was given on 12/15/2012.   (4) The patient participated in the Star study remotely, completing 5 years of tamoxifen in the late 1990s  (5) to start an aromatase inhibitor at the completion of chemotherapy  (6) definitive implant placement planned for June 2015  PLAN: Elan is doing terrific as far as her chemotherapy is concerned, and most importantly shows no evidence of peripheral neuropathy. Her echo also continues very favorably.  She starting her final cycle of paclitaxel today. She will have her last dose on may first. She's not scheduled for visits the next 2 weeks but she knows to let me know if any numbness or tingling or any other problems develop.  Otherwise she will see me May 21. She will start her trastuzumab alone on may 22nd and at that point of course she will receive trastuzumab every 3 weeks, not weekly. The plan will be to continue with the total one year, with echocardiograms every 3  months.  She hopes to  have her definitive implants placed early June of this year.  Zitlali is a good understanding of the overall plan. She agrees with it. She knows to call for any problems that may develop before her next visit here.  Chauncey Cruel, MD   03/10/2014 10:15 AM

## 2014-03-17 ENCOUNTER — Ambulatory Visit (HOSPITAL_BASED_OUTPATIENT_CLINIC_OR_DEPARTMENT_OTHER): Payer: BC Managed Care – PPO

## 2014-03-17 ENCOUNTER — Other Ambulatory Visit (HOSPITAL_BASED_OUTPATIENT_CLINIC_OR_DEPARTMENT_OTHER): Payer: BC Managed Care – PPO

## 2014-03-17 ENCOUNTER — Ambulatory Visit: Payer: BC Managed Care – PPO

## 2014-03-17 VITALS — BP 132/65 | HR 72 | Temp 98.7°F

## 2014-03-17 DIAGNOSIS — C50419 Malignant neoplasm of upper-outer quadrant of unspecified female breast: Secondary | ICD-10-CM

## 2014-03-17 DIAGNOSIS — C50411 Malignant neoplasm of upper-outer quadrant of right female breast: Secondary | ICD-10-CM

## 2014-03-17 DIAGNOSIS — Z5111 Encounter for antineoplastic chemotherapy: Secondary | ICD-10-CM

## 2014-03-17 LAB — CBC WITH DIFFERENTIAL/PLATELET
BASO%: 1.3 % (ref 0.0–2.0)
BASOS ABS: 0.1 10*3/uL (ref 0.0–0.1)
EOS ABS: 0.4 10*3/uL (ref 0.0–0.5)
EOS%: 5 % (ref 0.0–7.0)
HCT: 35.3 % (ref 34.8–46.6)
HEMOGLOBIN: 12.1 g/dL (ref 11.6–15.9)
LYMPH%: 30.9 % (ref 14.0–49.7)
MCH: 30.9 pg (ref 25.1–34.0)
MCHC: 34.3 g/dL (ref 31.5–36.0)
MCV: 90.3 fL (ref 79.5–101.0)
MONO#: 0.5 10*3/uL (ref 0.1–0.9)
MONO%: 7.2 % (ref 0.0–14.0)
NEUT%: 55.6 % (ref 38.4–76.8)
NEUTROS ABS: 4 10*3/uL (ref 1.5–6.5)
NRBC: 0 % (ref 0–0)
Platelets: 269 10*3/uL (ref 145–400)
RBC: 3.91 10*6/uL (ref 3.70–5.45)
RDW: 13.8 % (ref 11.2–14.5)
WBC: 7.2 10*3/uL (ref 3.9–10.3)
lymph#: 2.2 10*3/uL (ref 0.9–3.3)

## 2014-03-17 MED ORDER — ONDANSETRON 8 MG/NS 50 ML IVPB
INTRAVENOUS | Status: AC
  Filled 2014-03-17: qty 8

## 2014-03-17 MED ORDER — DEXAMETHASONE SODIUM PHOSPHATE 10 MG/ML IJ SOLN
INTRAMUSCULAR | Status: AC
  Filled 2014-03-17: qty 1

## 2014-03-17 MED ORDER — ONDANSETRON 8 MG/50ML IVPB (CHCC)
8.0000 mg | Freq: Once | INTRAVENOUS | Status: AC
Start: 2014-03-17 — End: 2014-03-17
  Administered 2014-03-17: 8 mg via INTRAVENOUS

## 2014-03-17 MED ORDER — DIPHENHYDRAMINE HCL 50 MG/ML IJ SOLN
INTRAMUSCULAR | Status: AC
  Filled 2014-03-17: qty 1

## 2014-03-17 MED ORDER — FAMOTIDINE IN NACL 20-0.9 MG/50ML-% IV SOLN
20.0000 mg | Freq: Once | INTRAVENOUS | Status: AC
  Administered 2014-03-17: 20 mg via INTRAVENOUS

## 2014-03-17 MED ORDER — DIPHENHYDRAMINE HCL 50 MG/ML IJ SOLN
25.0000 mg | Freq: Once | INTRAMUSCULAR | Status: AC
  Administered 2014-03-17: 25 mg via INTRAVENOUS

## 2014-03-17 MED ORDER — SODIUM CHLORIDE 0.9 % IV SOLN
Freq: Once | INTRAVENOUS | Status: AC
  Administered 2014-03-17: 13:00:00 via INTRAVENOUS

## 2014-03-17 MED ORDER — DEXAMETHASONE SODIUM PHOSPHATE 10 MG/ML IJ SOLN
10.0000 mg | Freq: Once | INTRAMUSCULAR | Status: AC
  Administered 2014-03-17: 10 mg via INTRAVENOUS

## 2014-03-17 MED ORDER — FAMOTIDINE IN NACL 20-0.9 MG/50ML-% IV SOLN
INTRAVENOUS | Status: AC
  Filled 2014-03-17: qty 50

## 2014-03-17 MED ORDER — HEPARIN SOD (PORK) LOCK FLUSH 100 UNIT/ML IV SOLN
500.0000 [IU] | Freq: Once | INTRAVENOUS | Status: AC | PRN
  Administered 2014-03-17: 500 [IU]
  Filled 2014-03-17: qty 5

## 2014-03-17 MED ORDER — SODIUM CHLORIDE 0.9 % IJ SOLN
10.0000 mL | INTRAMUSCULAR | Status: DC | PRN
  Administered 2014-03-17: 10 mL
  Filled 2014-03-17: qty 10

## 2014-03-17 MED ORDER — DEXTROSE 5 % IV SOLN
80.0000 mg/m2 | Freq: Once | INTRAVENOUS | Status: AC
  Administered 2014-03-17: 144 mg via INTRAVENOUS
  Filled 2014-03-17: qty 24

## 2014-03-17 NOTE — Patient Instructions (Signed)
[facility] Discharge Instructions for Patients Receiving Chemotherapy  Today you received the following chemotherapy agents taxol  To help prevent nausea and vomiting after your treatment, we encourage you to take your nausea medication as directed   If you develop nausea and vomiting that is not controlled by your nausea medication, call the clinic.   BELOW ARE SYMPTOMS THAT SHOULD BE REPORTED IMMEDIATELY:  *FEVER GREATER THAN 100.5 F  *CHILLS WITH OR WITHOUT FEVER  NAUSEA AND VOMITING THAT IS NOT CONTROLLED WITH YOUR NAUSEA MEDICATION  *UNUSUAL SHORTNESS OF BREATH  *UNUSUAL BRUISING OR BLEEDING  TENDERNESS IN MOUTH AND THROAT WITH OR WITHOUT PRESENCE OF ULCERS  *URINARY PROBLEMS  *BOWEL PROBLEMS  UNUSUAL RASH Items with * indicate a potential emergency and should be followed up as soon as possible.  Feel free to call the clinic you have any questions or concerns. The clinic phone number is [phone].  

## 2014-03-22 ENCOUNTER — Telehealth: Payer: Self-pay | Admitting: *Deleted

## 2014-03-22 NOTE — Telephone Encounter (Signed)
Received voice message from patient that her employer, [employer], is wanting to give PPD injections to all employees. Per Micah Flesher, PA, do not get the PPD injection while she is on active chemotherapy treatment. I informed patient of what Amy said. Patient verbalized understanding.

## 2014-03-23 ENCOUNTER — Encounter: Payer: Self-pay | Admitting: Obstetrics & Gynecology

## 2014-03-23 ENCOUNTER — Ambulatory Visit (INDEPENDENT_AMBULATORY_CARE_PROVIDER_SITE_OTHER): Payer: BC Managed Care – PPO | Admitting: Obstetrics & Gynecology

## 2014-03-23 VITALS — BP 142/82 | HR 64 | Resp 16 | Ht 66.5 in | Wt 159.8 lb

## 2014-03-23 DIAGNOSIS — R82998 Other abnormal findings in urine: Secondary | ICD-10-CM

## 2014-03-23 DIAGNOSIS — Z0000 Encounter for general adult medical examination without abnormal findings: Secondary | ICD-10-CM

## 2014-03-23 DIAGNOSIS — Z01419 Encounter for gynecological examination (general) (routine) without abnormal findings: Secondary | ICD-10-CM

## 2014-03-23 DIAGNOSIS — Z124 Encounter for screening for malignant neoplasm of cervix: Secondary | ICD-10-CM

## 2014-03-23 NOTE — Progress Notes (Signed)
63 y.o. G2P2 MarriedCaucasianF here for annual exam.  Diagnosis of breast cancer this past year found via MRI.  Had bilateral mastectomy.  Has tissue expanders right now for implants.  Dr. Harlow Mares is doing this part of the management.    Currently In the middle of chemotherapy.  Last Taxol dose tomorrow.  Then will do Herceptin for another year.  She will probably go on Tamoxifen.    Did genetic testing in November.  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  Reviewed notes in electronic records.  Having pain with intercourse.  Worse with current therapy.  Has used vagifem in the past.  Cannot use this now.  D/W pt coconut oil use.    Seeing Dr. Laurann Montana yearly.  Has appt in the summer.  Patient's last menstrual period was 11/24/2004.          Sexually active: yes  The current method of family planning is tubal ligation.    Exercising: yes  walking and gardening Smoker:  no  Health Maintenance: Pap:  12/16/11-WNL/negative HR HPV History of abnormal Pap:  no MMG:  2014-mmg, [place], MRI Colonoscopy:  2012-repeat in 5 years BMD:   2012 TDaP:  2/08 Screening Labs: PCP, Hb today: PCP, Urine today: +2 WBC   reports that she has never smoked. She has never used smokeless tobacco. She reports that she drinks alcohol. She reports that she does not use illicit drugs.  Past Medical History  Diagnosis Date  . Breast cancer   . Hashimoto's thyroiditis     with goiter  . Hypercholesteremia   . 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  Narrowing of the 4th and 5th lumbar interspaces.   Marland Kitchen Spina bifida     occutta L5  . Anxiety   . GERD (gastroesophageal reflux disease)   . Headache(784.0)     hx migraines  . Varicose vein     Past Surgical History  Procedure Laterality Date  . Hernia repair    . Tubal ligation    . Cystoscopy with urethral caruncle    . Mastectomy w/ sentinel node biopsy Bilateral  10/13/2013    Procedure:  BILATERAL MASTECTOMY WITH RIGHT SENTINEL LYMPH NODE BIOPSY;  Surgeon: Merrie Roof, MD;  Location: [place];  Service: General;  Laterality: Bilateral;  . Portacath placement Left 10/13/2013    Procedure: INSERTION PORT-A-CATH;  Surgeon: Merrie Roof, MD;  Location: [place];  Service: General;  Laterality: Left;  . Breast reconstruction with placement of tissue expander and flex hd (acellular hydrated dermis) Bilateral 10/13/2013    Procedure: PLACEMENT OF BILATERAL TISSUE EXPANDER FOR BREAST RECONSTRUCTION ;  Surgeon: Crissie Reese, MD;  Location: [place];  Service: Plastics;  Laterality: Bilateral;    Current Outpatient Prescriptions  Medication Sig Dispense Refill  . aspirin 81 MG tablet Take 81 mg by mouth daily.      Marland Kitchen atorvastatin (LIPITOR) 10 MG tablet Take 10 mg by mouth at bedtime.       . Calcium-Vitamin D-Vitamin K (VIACTIV) 573-220-25 MG-UNT-MCG CHEW Chew 1 tablet by mouth.       . Cholecalciferol (D3-1000) 1000 UNITS tablet Take 1,000 Units by mouth daily.      . fluticasone (FLONASE) 50 MCG/ACT nasal spray Place 2 sprays into both nostrils daily.      Marland Kitchen levothyroxine (SYNTHROID, LEVOTHROID) 50 MCG tablet Take 50 mcg by  mouth daily before breakfast. Take 50 mcg 1 hours before breakfast.      . lidocaine-prilocaine (EMLA) cream Apply 1 application topically as needed.  30 g  0  . Nutritional Supplements (JUICE PLUS FIBRE) LIQD Take by mouth.      Marland Kitchen PARoxetine (PAXIL) 20 MG tablet Take 20 mg by mouth at bedtime.       . prochlorperazine (COMPAZINE) 10 MG tablet        No current facility-administered medications for this visit.    Family History  Problem Relation Age of Onset  . Breast cancer Mother 19  . Lung cancer Father   . Breast cancer Sister 74    Bilateral breast cancer with 2nd dx at 26  . Breast cancer Maternal Aunt 52  . Breast cancer Sister 39  . Lung cancer Maternal Uncle   . Leukemia Paternal Uncle   . Melanoma Brother 52    on ear   . Prostate cancer Brother 1  . Cancer Cousin     maternal cousin with cancer - NOS    ROS:  Pertinent items are noted in HPI.  Otherwise, a comprehensive ROS was negative.  Exam:   BP 142/82  Pulse 64  Resp 16  Ht 5' 6.5" (1.689 m)  Wt 159 lb 12.8 oz (72.485 kg)  BMI 25.41 kg/m2  LMP 11/24/2004  Weight change: +7lb   Height: 5' 6.5" (168.9 cm)  Ht Readings from Last 3 Encounters:  03/23/14 5' 6.5" (1.689 m)  03/10/14 5' 6"  (1.676 m)  02/28/14 5' 6"  (1.676 m)    General appearance: alert, cooperative and appears stated age Head: Normocephalic, without obvious abnormality, atraumatic Neck: no adenopathy, supple, symmetrical, trachea midline and thyroid normal to inspection and palpation Lungs: clear to auscultation bilaterally Breasts: normal appearance, no masses or tenderness Heart: regular rate and rhythm Abdomen: soft, non-tender; bowel sounds normal; no masses,  no organomegaly Extremities: extremities normal, atraumatic, no cyanosis or edema Skin: Skin color, texture, turgor normal. No rashes or lesions Lymph nodes: Cervical, supraclavicular, and axillary nodes normal. No abnormal inguinal nodes palpated Neurologic: Grossly normal   Pelvic: External genitalia:  no lesions              Urethra:  normal appearing urethra with no masses, tenderness or lesions              Bartholins and Skenes: normal                 Vagina: normal appearing vagina with normal color and discharge, no lesions              Cervix: no lesions              Pap taken: yes Bimanual Exam:  Uterus:  normal size, contour, position, consistency, mobility, non-tender              Adnexa: no mass, fullness, tenderness               Rectovaginal: Confirms               Anus:  normal sphincter tone, no lesions  A:  Well Woman with normal exam Breast cancer, ER+/PR+, her-2 positive.  Underwent bilateral mastectomy, and undergoing chemo.  Tomorrow is last infusion!! Strong family hx of breast  cancer in mother, m. Aunt, two sisters Osteopenia WBC's in urine today  P:   Mammograms no longer needed pap smear obtained today.  Neg HR HPV 1/13 Urine micro  and culture today. return annually or prn  An After Visit Summary was printed and given to the patient.

## 2014-03-23 NOTE — Patient Instructions (Signed)

## 2014-03-24 ENCOUNTER — Other Ambulatory Visit (HOSPITAL_BASED_OUTPATIENT_CLINIC_OR_DEPARTMENT_OTHER): Payer: BC Managed Care – PPO

## 2014-03-24 ENCOUNTER — Ambulatory Visit (HOSPITAL_BASED_OUTPATIENT_CLINIC_OR_DEPARTMENT_OTHER): Payer: BC Managed Care – PPO

## 2014-03-24 DIAGNOSIS — C50411 Malignant neoplasm of upper-outer quadrant of right female breast: Secondary | ICD-10-CM

## 2014-03-24 DIAGNOSIS — C50419 Malignant neoplasm of upper-outer quadrant of unspecified female breast: Secondary | ICD-10-CM

## 2014-03-24 DIAGNOSIS — Z5112 Encounter for antineoplastic immunotherapy: Secondary | ICD-10-CM

## 2014-03-24 DIAGNOSIS — Z5111 Encounter for antineoplastic chemotherapy: Secondary | ICD-10-CM

## 2014-03-24 LAB — URINALYSIS, MICROSCOPIC ONLY
BACTERIA UA: NONE SEEN
CRYSTALS: NONE SEEN
Casts: NONE SEEN
SQUAMOUS EPITHELIAL / LPF: NONE SEEN

## 2014-03-24 LAB — CBC WITH DIFFERENTIAL/PLATELET
BASO%: 1.4 % (ref 0.0–2.0)
BASOS ABS: 0.1 10*3/uL (ref 0.0–0.1)
EOS ABS: 0.4 10*3/uL (ref 0.0–0.5)
EOS%: 5.1 % (ref 0.0–7.0)
HCT: 32.2 % — ABNORMAL LOW (ref 34.8–46.6)
HEMOGLOBIN: 10.8 g/dL — AB (ref 11.6–15.9)
LYMPH#: 2.1 10*3/uL (ref 0.9–3.3)
LYMPH%: 30 % (ref 14.0–49.7)
MCH: 30.8 pg (ref 25.1–34.0)
MCHC: 33.4 g/dL (ref 31.5–36.0)
MCV: 92.2 fL (ref 79.5–101.0)
MONO#: 0.4 10*3/uL (ref 0.1–0.9)
MONO%: 5.8 % (ref 0.0–14.0)
NEUT%: 57.7 % (ref 38.4–76.8)
NEUTROS ABS: 4.1 10*3/uL (ref 1.5–6.5)
Platelets: 283 10*3/uL (ref 145–400)
RBC: 3.49 10*6/uL — ABNORMAL LOW (ref 3.70–5.45)
RDW: 14.9 % — AB (ref 11.2–14.5)
WBC: 7 10*3/uL (ref 3.9–10.3)

## 2014-03-24 LAB — IPS PAP SMEAR ONLY

## 2014-03-24 LAB — COMPREHENSIVE METABOLIC PANEL (CC13)
ALBUMIN: 3.5 g/dL (ref 3.5–5.0)
ALT: 44 U/L (ref 0–55)
ANION GAP: 9 meq/L (ref 3–11)
AST: 23 U/L (ref 5–34)
Alkaline Phosphatase: 66 U/L (ref 40–150)
BUN: 26.1 mg/dL — ABNORMAL HIGH (ref 7.0–26.0)
CHLORIDE: 107 meq/L (ref 98–109)
CO2: 24 meq/L (ref 22–29)
CREATININE: 0.9 mg/dL (ref 0.6–1.1)
Calcium: 9.3 mg/dL (ref 8.4–10.4)
GLUCOSE: 111 mg/dL (ref 70–140)
POTASSIUM: 4.2 meq/L (ref 3.5–5.1)
Sodium: 140 mEq/L (ref 136–145)
Total Bilirubin: 0.33 mg/dL (ref 0.20–1.20)
Total Protein: 6.3 g/dL — ABNORMAL LOW (ref 6.4–8.3)

## 2014-03-24 MED ORDER — FAMOTIDINE IN NACL 20-0.9 MG/50ML-% IV SOLN
20.0000 mg | Freq: Once | INTRAVENOUS | Status: AC
  Administered 2014-03-24: 20 mg via INTRAVENOUS

## 2014-03-24 MED ORDER — DEXAMETHASONE SODIUM PHOSPHATE 10 MG/ML IJ SOLN
INTRAMUSCULAR | Status: AC
  Filled 2014-03-24: qty 1

## 2014-03-24 MED ORDER — SODIUM CHLORIDE 0.9 % IV SOLN
Freq: Once | INTRAVENOUS | Status: AC
  Administered 2014-03-24: 15:00:00 via INTRAVENOUS

## 2014-03-24 MED ORDER — FAMOTIDINE IN NACL 20-0.9 MG/50ML-% IV SOLN
INTRAVENOUS | Status: AC
  Filled 2014-03-24: qty 50

## 2014-03-24 MED ORDER — ONDANSETRON 8 MG/50ML IVPB (CHCC)
8.0000 mg | Freq: Once | INTRAVENOUS | Status: AC
  Administered 2014-03-24: 8 mg via INTRAVENOUS

## 2014-03-24 MED ORDER — ACETAMINOPHEN 325 MG PO TABS
650.0000 mg | ORAL_TABLET | Freq: Once | ORAL | Status: AC
  Administered 2014-03-24: 650 mg via ORAL

## 2014-03-24 MED ORDER — DIPHENHYDRAMINE HCL 50 MG/ML IJ SOLN
25.0000 mg | Freq: Once | INTRAMUSCULAR | Status: AC
  Administered 2014-03-24: 25 mg via INTRAVENOUS

## 2014-03-24 MED ORDER — ONDANSETRON 8 MG/NS 50 ML IVPB
INTRAVENOUS | Status: AC
  Filled 2014-03-24: qty 8

## 2014-03-24 MED ORDER — PACLITAXEL CHEMO INJECTION 300 MG/50ML
80.0000 mg/m2 | Freq: Once | INTRAVENOUS | Status: AC
  Administered 2014-03-24: 144 mg via INTRAVENOUS
  Filled 2014-03-24: qty 24

## 2014-03-24 MED ORDER — ACETAMINOPHEN 325 MG PO TABS
ORAL_TABLET | ORAL | Status: AC
  Filled 2014-03-24: qty 2

## 2014-03-24 MED ORDER — HEPARIN SOD (PORK) LOCK FLUSH 100 UNIT/ML IV SOLN
500.0000 [IU] | Freq: Once | INTRAVENOUS | Status: AC | PRN
  Administered 2014-03-24: 500 [IU]
  Filled 2014-03-24: qty 5

## 2014-03-24 MED ORDER — DIPHENHYDRAMINE HCL 50 MG/ML IJ SOLN
INTRAMUSCULAR | Status: AC
  Filled 2014-03-24: qty 1

## 2014-03-24 MED ORDER — TRASTUZUMAB CHEMO INJECTION 440 MG
4.0000 mg/kg | Freq: Once | INTRAVENOUS | Status: AC
  Administered 2014-03-24: 273 mg via INTRAVENOUS
  Filled 2014-03-24: qty 13

## 2014-03-24 MED ORDER — SODIUM CHLORIDE 0.9 % IJ SOLN
10.0000 mL | INTRAMUSCULAR | Status: DC | PRN
  Administered 2014-03-24: 10 mL
  Filled 2014-03-24: qty 10

## 2014-03-24 MED ORDER — DEXAMETHASONE SODIUM PHOSPHATE 10 MG/ML IJ SOLN
10.0000 mg | Freq: Once | INTRAMUSCULAR | Status: AC
  Administered 2014-03-24: 10 mg via INTRAVENOUS

## 2014-03-24 NOTE — Patient Instructions (Signed)
Paclitaxel injection  What is this medicine?  PACLITAXEL (PAK li TAX el) is a chemotherapy drug. It targets fast dividing cells, like cancer cells, and causes these cells to die. This medicine is used to treat ovarian cancer, breast cancer, and other cancers.  This medicine may be used for other purposes; ask your health care provider or pharmacist if you have questions.  COMMON BRAND NAME(S): Onxol , Taxol  What should I tell my health care provider before I take this medicine?  They need to know if you have any of these conditions:  -blood disorders  -irregular heartbeat  -infection (especially a virus infection such as chickenpox, cold sores, or herpes)  -liver disease  -previous or ongoing radiation therapy  -an unusual or allergic reaction to paclitaxel, alcohol, polyoxyethylated castor oil, other chemotherapy agents, other medicines, foods, dyes, or preservatives  -pregnant or trying to get pregnant  -breast-feeding  How should I use this medicine?  This drug is given as an infusion into a vein. It is administered in a hospital or clinic by a specially trained health care professional.  Talk to your pediatrician regarding the use of this medicine in children. Special care may be needed.  Overdosage: If you think you have taken too much of this medicine contact a poison control center or emergency room at once.  NOTE: This medicine is only for you. Do not share this medicine with others.  What if I miss a dose?  It is important not to miss your dose. Call your doctor or health care professional if you are unable to keep an appointment.  What may interact with this medicine?  Do not take this medicine with any of the following medications:  -disulfiram  -metronidazole  This medicine may also interact with the following medications:  -cyclosporine  -diazepam  -ketoconazole  -medicines to increase blood counts like filgrastim, pegfilgrastim, sargramostim  -other chemotherapy drugs like cisplatin, doxorubicin,  epirubicin, etoposide, teniposide, vincristine  -quinidine  -testosterone  -vaccines  -verapamil  Talk to your doctor or health care professional before taking any of these medicines:  -acetaminophen  -aspirin  -ibuprofen  -ketoprofen  -naproxen  This list may not describe all possible interactions. Give your health care provider a list of all the medicines, herbs, non-prescription drugs, or dietary supplements you use. Also tell them if you smoke, drink alcohol, or use illegal drugs. Some items may interact with your medicine.  What should I watch for while using this medicine?  Your condition will be monitored carefully while you are receiving this medicine. You will need important blood work done while you are taking this medicine.  This drug may make you feel generally unwell. This is not uncommon, as chemotherapy can affect healthy cells as well as cancer cells. Report any side effects. Continue your course of treatment even though you feel ill unless your doctor tells you to stop.  In some cases, you may be given additional medicines to help with side effects. Follow all directions for their use.  Call your doctor or health care professional for advice if you get a fever, chills or sore throat, or other symptoms of a cold or flu. Do not treat yourself. This drug decreases your body's ability to fight infections. Try to avoid being around people who are sick.  This medicine may increase your risk to bruise or bleed. Call your doctor or health care professional if you notice any unusual bleeding.  Be careful brushing and flossing your teeth or   using a toothpick because you may get an infection or bleed more easily. If you have any dental work done, tell your dentist you are receiving this medicine.  Avoid taking products that contain aspirin, acetaminophen, ibuprofen, naproxen, or ketoprofen unless instructed by your doctor. These medicines may hide a fever.  Do not become pregnant while taking this medicine.  Women should inform their doctor if they wish to become pregnant or think they might be pregnant. There is a potential for serious side effects to an unborn child. Talk to your health care professional or pharmacist for more information. Do not breast-feed an infant while taking this medicine.  Men are advised not to father a child while receiving this medicine.  What side effects may I notice from receiving this medicine?  Side effects that you should report to your doctor or health care professional as soon as possible:  -allergic reactions like skin rash, itching or hives, swelling of the face, lips, or tongue  -low blood counts - This drug may decrease the number of white blood cells, red blood cells and platelets. You may be at increased risk for infections and bleeding.  -signs of infection - fever or chills, cough, sore throat, pain or difficulty passing urine  -signs of decreased platelets or bleeding - bruising, pinpoint red spots on the skin, black, tarry stools, nosebleeds  -signs of decreased red blood cells - unusually weak or tired, fainting spells, lightheadedness  -breathing problems  -chest pain  -high or low blood pressure  -mouth sores  -nausea and vomiting  -pain, swelling, redness or irritation at the injection site  -pain, tingling, numbness in the hands or feet  -slow or irregular heartbeat  -swelling of the ankle, feet, hands  Side effects that usually do not require medical attention (report to your doctor or health care professional if they continue or are bothersome):  -bone pain  -complete hair loss including hair on your head, underarms, pubic hair, eyebrows, and eyelashes  -changes in the color of fingernails  -diarrhea  -loosening of the fingernails  -loss of appetite  -muscle or joint pain  -red flush to skin  -sweating  This list may not describe all possible side effects. Call your doctor for medical advice about side effects. You may report side effects to FDA at  1-800-FDA-1088.  Where should I keep my medicine?  This drug is given in a hospital or clinic and will not be stored at home.  NOTE: This sheet is a summary. It may not cover all possible information. If you have questions about this medicine, talk to your doctor, pharmacist, or health care provider.   2014, Elsevier/Gold Standard. (2013-01-03 16:41:21)    Trastuzumab injection for infusion  What is this medicine?  TRASTUZUMAB (tras TOO zoo mab) is a monoclonal antibody. It targets a protein called HER2. This protein is found in some stomach and breast cancers. This medicine can stop cancer cell growth. This medicine may be used with other cancer treatments.  This medicine may be used for other purposes; ask your health care provider or pharmacist if you have questions.  COMMON BRAND NAME(S): Herceptin  What should I tell my health care provider before I take this medicine?  They need to know if you have any of these conditions:  -heart disease  -heart failure  -infection (especially a virus infection such as chickenpox, cold sores, or herpes)  -lung or breathing disease, like asthma  -recent or ongoing radiation therapy  -an unusual   or allergic reaction to trastuzumab, benzyl alcohol, or other medications, foods, dyes, or preservatives  -pregnant or trying to get pregnant  -breast-feeding  How should I use this medicine?  This drug is given as an infusion into a vein. It is administered in a hospital or clinic by a specially trained health care professional.  Talk to your pediatrician regarding the use of this medicine in children. This medicine is not approved for use in children.  Overdosage: If you think you have taken too much of this medicine contact a poison control center or emergency room at once.  NOTE: This medicine is only for you. Do not share this medicine with others.  What if I miss a dose?  It is important not to miss a dose. Call your doctor or health care professional if you are unable to keep an  appointment.  What may interact with this medicine?  -cyclophosphamide  -doxorubicin  -warfarin  This list may not describe all possible interactions. Give your health care provider a list of all the medicines, herbs, non-prescription drugs, or dietary supplements you use. Also tell them if you smoke, drink alcohol, or use illegal drugs. Some items may interact with your medicine.  What should I watch for while using this medicine?  Visit your doctor for checks on your progress. Report any side effects. Continue your course of treatment even though you feel ill unless your doctor tells you to stop.  Call your doctor or health care professional for advice if you get a fever, chills or sore throat, or other symptoms of a cold or flu. Do not treat yourself. Try to avoid being around people who are sick.  You may experience fever, chills and shaking during your first infusion. These effects are usually mild and can be treated with other medicines. Report any side effects during the infusion to your health care professional. Fever and chills usually do not happen with later infusions.  What side effects may I notice from receiving this medicine?  Side effects that you should report to your doctor or other health care professional as soon as possible:  -breathing difficulties  -chest pain or palpitations  -cough  -dizziness or fainting  -fever or chills, sore throat  -skin rash, itching or hives  -swelling of the legs or ankles  -unusually weak or tired  Side effects that usually do not require medical attention (report to your doctor or other health care professional if they continue or are bothersome):  -loss of appetite  -headache  -muscle aches  -nausea  This list may not describe all possible side effects. Call your doctor for medical advice about side effects. You may report side effects to FDA at 1-800-FDA-1088.  Where should I keep my medicine?  This drug is given in a hospital or clinic and will not be stored at  home.  NOTE: This sheet is a summary. It may not cover all possible information. If you have questions about this medicine, talk to your doctor, pharmacist, or health care provider.   2014, Elsevier/Gold Standard. (2009-09-14 13:43:15)

## 2014-03-25 LAB — URINE CULTURE
Colony Count: NO GROWTH
Organism ID, Bacteria: NO GROWTH

## 2014-04-06 ENCOUNTER — Telehealth: Payer: Self-pay | Admitting: Oncology

## 2014-04-06 NOTE — Telephone Encounter (Signed)
cld pt to adv that the lab had been moved to 5/21 @3 :30 and trmt stayed the same 5/22 @1 :15

## 2014-04-13 ENCOUNTER — Other Ambulatory Visit (HOSPITAL_BASED_OUTPATIENT_CLINIC_OR_DEPARTMENT_OTHER): Payer: BC Managed Care – PPO

## 2014-04-13 ENCOUNTER — Ambulatory Visit (HOSPITAL_BASED_OUTPATIENT_CLINIC_OR_DEPARTMENT_OTHER): Payer: BC Managed Care – PPO | Admitting: Oncology

## 2014-04-13 VITALS — BP 130/76 | HR 81 | Temp 98.9°F | Resp 18 | Ht 66.5 in | Wt 162.3 lb

## 2014-04-13 DIAGNOSIS — C50411 Malignant neoplasm of upper-outer quadrant of right female breast: Secondary | ICD-10-CM

## 2014-04-13 DIAGNOSIS — Z170 Estrogen receptor positive status [ER+]: Secondary | ICD-10-CM

## 2014-04-13 DIAGNOSIS — C50219 Malignant neoplasm of upper-inner quadrant of unspecified female breast: Secondary | ICD-10-CM

## 2014-04-13 DIAGNOSIS — C50419 Malignant neoplasm of upper-outer quadrant of unspecified female breast: Secondary | ICD-10-CM

## 2014-04-13 LAB — CBC WITH DIFFERENTIAL/PLATELET
BASO%: 2.6 % — ABNORMAL HIGH (ref 0.0–2.0)
BASOS ABS: 0.2 10*3/uL — AB (ref 0.0–0.1)
EOS%: 4.7 % (ref 0.0–7.0)
Eosinophils Absolute: 0.3 10*3/uL (ref 0.0–0.5)
HEMATOCRIT: 32 % — AB (ref 34.8–46.6)
HEMOGLOBIN: 10.8 g/dL — AB (ref 11.6–15.9)
LYMPH%: 36 % (ref 14.0–49.7)
MCH: 30.8 pg (ref 25.1–34.0)
MCHC: 33.9 g/dL (ref 31.5–36.0)
MCV: 91 fL (ref 79.5–101.0)
MONO#: 0.7 10*3/uL (ref 0.1–0.9)
MONO%: 9.9 % (ref 0.0–14.0)
NEUT#: 3.4 10*3/uL (ref 1.5–6.5)
NEUT%: 46.8 % (ref 38.4–76.8)
PLATELETS: 365 10*3/uL (ref 145–400)
RBC: 3.51 10*6/uL — ABNORMAL LOW (ref 3.70–5.45)
RDW: 15.8 % — ABNORMAL HIGH (ref 11.2–14.5)
WBC: 7.3 10*3/uL (ref 3.9–10.3)
lymph#: 2.6 10*3/uL (ref 0.9–3.3)

## 2014-04-13 LAB — COMPREHENSIVE METABOLIC PANEL (CC13)
ALBUMIN: 3.7 g/dL (ref 3.5–5.0)
ALT: 48 U/L (ref 0–55)
ANION GAP: 10 meq/L (ref 3–11)
AST: 31 U/L (ref 5–34)
Alkaline Phosphatase: 81 U/L (ref 40–150)
BUN: 26.8 mg/dL — AB (ref 7.0–26.0)
CHLORIDE: 106 meq/L (ref 98–109)
CO2: 23 meq/L (ref 22–29)
Calcium: 9.1 mg/dL (ref 8.4–10.4)
Creatinine: 0.9 mg/dL (ref 0.6–1.1)
Glucose: 75 mg/dl (ref 70–140)
POTASSIUM: 4 meq/L (ref 3.5–5.1)
Sodium: 139 mEq/L (ref 136–145)
Total Bilirubin: 0.25 mg/dL (ref 0.20–1.20)
Total Protein: 7 g/dL (ref 6.4–8.3)

## 2014-04-13 NOTE — Progress Notes (Signed)
ID: Barbara [name] OB: 10-13-1951  MR#: [id number]  BSW#:[id number]  PCP: [name], MD GYN:  Felipa Emory SU: Star Age OTHER GY:KZLDJT Blair Heys  CHIEF COMPLAINT: Right Breast Cancer/  Under active treatment  BREAST CANCER HISTORY: The patient has a very strong family history for breast cancer, although she has tested  negative for the BRCA mutations (as have 2 of her sisters). She had been receiving breast MRI in addition to mammography, but had not had this test performed since October of 2010. More recently, 07/22/2013, routine screening mammography with tomography at [facility] showed no worrisome findings in the setting of extremely dense breasts.  MRI of the breast 09/08/2013, however, showed in the right upper outer quadrant an irregular mass measuring 1.7 cm. This was new as compared to the prior MRI from 2010. There were no abnormal lymph nodes and no other areas of concern in either breast. 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 The MIB-1 was 25%.  The patient's subsequent history is as detailed below  INTERVAL HISTORY: Barbara [name] returns today for followup of her right breast cancer.  Since the last visit here she has completed her adjuvant paclitaxel, which she tolerated well. She did not develop any peripheral neuropathy. Of course she is continuing on anti-HER-2 treatment, namely trastuzumab alone, and she will receive her treatment today and every 3 weeks through May January 2016  REVIEW OF SYSTEMS: Barbara [name] is pleased that her hair is already starting to grow back and it is not completely quite. She's picking up some energy. She has started an exercise program, mostly walking, but would like to do some weights as well. She does have vaginal dryness issues. Hot  flashes are not a concern. A detailed review of systems today was otherwise entirely negative.  PAST MEDICAL HISTORY: Past Medical History  Diagnosis Date  . Breast cancer   . Hashimoto's thyroiditis     with goiter  . Hypercholesteremia   . 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  Narrowing of the 4th and 5th lumbar interspaces.   Marland Kitchen Spina bifida     occutta L5  . Anxiety   . GERD (gastroesophageal reflux disease)   . Headache(784.0)     hx migraines  . Varicose vein     PAST SURGICAL HISTORY: Past Surgical History  Procedure Laterality Date  . Hernia repair    . Tubal ligation    . Cystoscopy with urethral caruncle    . Mastectomy w/ sentinel node biopsy Bilateral 10/13/2013    Procedure:  BILATERAL MASTECTOMY WITH RIGHT SENTINEL LYMPH NODE BIOPSY;  Surgeon: Merrie Roof, MD;  Location: [place];  Service: General;  Laterality: Bilateral;  . Portacath placement Left 10/13/2013    Procedure: INSERTION PORT-A-CATH;  Surgeon: Merrie Roof, MD;  Location: [place];  Service: General;  Laterality: Left;  . Breast reconstruction with placement of tissue expander and flex hd (acellular hydrated dermis) Bilateral 10/13/2013    Procedure: PLACEMENT OF BILATERAL TISSUE EXPANDER FOR BREAST RECONSTRUCTION ;  Surgeon: Crissie Reese, MD;  Location: [place];  Service: Plastics;  Laterality: Bilateral;    FAMILY HISTORY Family History  Problem Relation Age of Onset  . Breast cancer Mother 53  . Lung cancer  Father   . Breast cancer Sister 17    Bilateral breast cancer with 2nd dx at 79  . Breast cancer Maternal Aunt 52  . Breast cancer Sister 6  . Lung cancer Maternal Uncle   . Leukemia Paternal Uncle   . Melanoma Brother 52    on ear  . Prostate cancer Brother 38  . Cancer Cousin     maternal cousin with cancer - NOS  The patient's mother was diagnosed with breast cancer the age of 71. She died at the age of 69. The patient's father  died at the age of 33, with a history of lung cancer. The patient had 2 brothers, and 2 sisters. The patient's sister Barbara [name] has a history of bilateral breast cancer in her sister Barbara [name] a history of left breast cancer. Both have been tested and are found to be negative for a BRCA one or 2 mutation. The patient's mother's sister also had a history of breast cancer. The patient herself has been tested for the BRCA mutation as well as through [facility]. No mutation has been file.   GYNECOLOGIC HISTORY:  Menarche age 63, first live birth age 48. The patient is GX P2. She stopped having periods in her early 41s. She did not take hormone replacement.  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 Her husband Ed used to work for [employer]. Daughter Barbara [name] works as a Music therapist in [place]. Son Barbara [name] is a drug representative for [employer] (diabetes). His wife, and Barbara [name], is a Ship broker in [place] emergency room's. The patient has 5 grandchildren. She at tends [place]    ADVANCED DIRECTIVES:  In place   HEALTH MAINTENANCE:  (Updated 02/10/2014) History  Substance Use Topics  . Smoking status: Never Smoker   . Smokeless tobacco: Never Used  . Alcohol Use: Yes     Comment: rarely     Colonoscopy: 2013/ [place]  PAP: 2013/Dr. Sabra Heck  Bone density: 2013/osteopenia  Lipid panel: Not on file/Dr. Laurann Montana   Allergies  Allergen Reactions  . Buprenex [Buprenorphine] Nausea And Vomiting and Other (See Comments)    Oversedation  . Simvastatin Other (See Comments)    Myalgias   . Codeine Hives, Itching and Rash    Current Outpatient Prescriptions  Medication Sig Dispense Refill  . aspirin 81 MG tablet Take 81 mg by mouth daily.      Marland Kitchen atorvastatin (LIPITOR) 10 MG tablet Take 10 mg by mouth at bedtime.       . Calcium-Vitamin D-Vitamin K (VIACTIV) 161-096-04 MG-UNT-MCG  CHEW Chew 1 tablet by mouth.       . Cholecalciferol (D3-1000) 1000 UNITS tablet Take 1,000 Units by mouth daily.      . fluticasone (FLONASE) 50 MCG/ACT nasal spray Place 2 sprays into both nostrils daily.      Marland Kitchen levothyroxine (SYNTHROID, LEVOTHROID) 50 MCG tablet Take 50 mcg by mouth daily before breakfast. Take 50 mcg 1 hours before breakfast.      . lidocaine-prilocaine (EMLA) cream Apply 1 application topically as needed.  30 g  0  . Nutritional Supplements (JUICE PLUS FIBRE) LIQD Take by mouth.      Marland Kitchen PARoxetine (PAXIL) 20 MG tablet Take 20 mg by mouth at bedtime.       . prochlorperazine (COMPAZINE) 10 MG tablet        No current facility-administered medications for this visit.  OBJECTIVE:  Middle-aged white woman who appears stated age 39 Vitals:   04/13/14 1550  BP: 130/76  Pulse: 81  Temp: 98.9 F (37.2 C)  Resp: 18  Body mass index is 25.81 kg/(m^2).   ECOG:  1 Filed Weights   04/13/14 1550  Weight: 162 lb 4.8 oz (73.619 kg)   Sclerae unicteric, EOMs intact Oropharynx clear and moister lesions No cervical or supraclavicular adenopathy Lungs no rales or rhonchi Heart regular rate and rhythm Abd soft, nontender, positive bowel sounds MSK no focal spinal tenderness, no upper extremity lymphedema Neuro: nonfocal, well oriented, positive affect Breasts: Status post bilateral mastectomies with expanders in place; no evidence of local recurrence; both axillae are benign  LAB RESULTS:   Lab Results  Component Value Date   WBC 7.3 04/13/2014   NEUTROABS 3.4 04/13/2014   HGB 10.8* 04/13/2014   HCT 32.0* 04/13/2014   MCV 91.0 04/13/2014   PLT 365 04/13/2014      Chemistry      Component Value Date/Time   NA 139 04/13/2014 1524   NA 138 10/12/2013 0940   K 4.0 04/13/2014 1524   K 4.3 10/12/2013 0940   CL 102 10/12/2013 0940   CO2 23 04/13/2014 1524   CO2 27 10/12/2013 0940   BUN 26.8* 04/13/2014 1524   BUN 18 10/12/2013 0940   CREATININE 0.9 04/13/2014 1524    CREATININE 0.91 10/12/2013 0940      Component Value Date/Time   CALCIUM 9.1 04/13/2014 1524   CALCIUM 9.7 10/12/2013 0940   ALKPHOS 81 04/13/2014 1524   AST 31 04/13/2014 1524   ALT 48 04/13/2014 1524   BILITOT 0.25 04/13/2014 1524      STUDIES:  Most recent echocardiogram on  03/03/2014  showed an ejection fraction of 55-60%.    ASSESSMENT: 63 y.o. BRCA negative [place] woman   (1)  status post right breast biopsy 09/21/2013 for a clinical T1c N0, stage IA invasive ductal carcinoma, grade 2, triple positive, with a HER-2: CEP 17 ratio of 5.55 and an average copy number per cell of  13.05. The MIB-1-1 was 25%  (2) status post bilateral mastectomies 10/13/2013 showing:  (a) on the left, atypical ductal hyperplasia  (b) on the right, a pT1c pN0, stage IA invasive ductal carcinoma, grade 2  (3) received paclitaxel/trastuzumab, with paclitaxel given on days 1, 8, and 15 and trastuzumab given every 2 weeks on days 1 and 15 of each 28 day cycle, for 4 cycles (12 doses of paclitaxel) completed 03/24/2014  (4) continuing trastuzumab alone every three weeks to complete a year (through mid-January 2016); echocardiogram April 2015 shows a well preserved ejection fraction   (5 The patient participated in the Star study remotely, completing 5 years of tamoxifen in the late 1990s  (6 started anastrozole May 2015  (7 definitive implant placement planned for June 2015  PLAN: Leighanna is doing fine from a breast cancer point of view, recovering from her chemotherapy without any evidence of end organ or prominent problems and tolerating the trastuzumab well.  Today we discussed antiestrogen therapy, and she will start anastrozole 1 mg daily. She has a good understanding of the possible toxicities, side effects and complications of this agent. She also understands the benefits.  I have encouraged her to continue and intensify her exercise program and gave her a [employer]. I also suggested  she participate in the "finding your new normal" group, about to resume this fall. I gave her a "pelvic health" pamphlet  as well.  She will see [place] again in July to make sure she is tolerating the anastrozole well. We will obtain a DEXA scan prior to that visit.  Nylia has a good understanding of the overall plan. She agrees with it. She knows the goal of treatment in her case is cure. She will call with any problems that may develop before her next visit here.   Chauncey Cruel, MD   04/13/2014 4:20 PM

## 2014-04-14 ENCOUNTER — Other Ambulatory Visit: Payer: BC Managed Care – PPO

## 2014-04-14 ENCOUNTER — Ambulatory Visit (HOSPITAL_BASED_OUTPATIENT_CLINIC_OR_DEPARTMENT_OTHER): Payer: BC Managed Care – PPO

## 2014-04-14 ENCOUNTER — Telehealth: Payer: Self-pay | Admitting: *Deleted

## 2014-04-14 ENCOUNTER — Telehealth: Payer: Self-pay | Admitting: Obstetrics & Gynecology

## 2014-04-14 DIAGNOSIS — Z5112 Encounter for antineoplastic immunotherapy: Secondary | ICD-10-CM

## 2014-04-14 DIAGNOSIS — C50419 Malignant neoplasm of upper-outer quadrant of unspecified female breast: Secondary | ICD-10-CM

## 2014-04-14 DIAGNOSIS — C50411 Malignant neoplasm of upper-outer quadrant of right female breast: Secondary | ICD-10-CM

## 2014-04-14 MED ORDER — SODIUM CHLORIDE 0.9 % IV SOLN
Freq: Once | INTRAVENOUS | Status: AC
  Administered 2014-04-14: 14:00:00 via INTRAVENOUS

## 2014-04-14 MED ORDER — DIPHENHYDRAMINE HCL 25 MG PO CAPS
25.0000 mg | ORAL_CAPSULE | Freq: Once | ORAL | Status: AC
  Administered 2014-04-14: 25 mg via ORAL

## 2014-04-14 MED ORDER — SODIUM CHLORIDE 0.9 % IV SOLN
6.0000 mg/kg | Freq: Once | INTRAVENOUS | Status: AC
  Administered 2014-04-14: 441 mg via INTRAVENOUS
  Filled 2014-04-14: qty 21

## 2014-04-14 MED ORDER — ACETAMINOPHEN 325 MG PO TABS
650.0000 mg | ORAL_TABLET | Freq: Once | ORAL | Status: AC
  Administered 2014-04-14: 650 mg via ORAL

## 2014-04-14 MED ORDER — SODIUM CHLORIDE 0.9 % IJ SOLN
10.0000 mL | INTRAMUSCULAR | Status: DC | PRN
  Administered 2014-04-14: 10 mL
  Filled 2014-04-14: qty 10

## 2014-04-14 MED ORDER — DIPHENHYDRAMINE HCL 25 MG PO CAPS
ORAL_CAPSULE | ORAL | Status: AC
  Filled 2014-04-14: qty 1

## 2014-04-14 MED ORDER — HEPARIN SOD (PORK) LOCK FLUSH 100 UNIT/ML IV SOLN
500.0000 [IU] | Freq: Once | INTRAVENOUS | Status: AC | PRN
  Administered 2014-04-14: 500 [IU]
  Filled 2014-04-14: qty 5

## 2014-04-14 MED ORDER — ACETAMINOPHEN 325 MG PO TABS
ORAL_TABLET | ORAL | Status: AC
  Filled 2014-04-14: qty 2

## 2014-04-14 NOTE — Telephone Encounter (Signed)
Per staff message and POF I have scheduled appts.  JMW  

## 2014-04-14 NOTE — Telephone Encounter (Signed)
Patient wants to know when her last DEXA was.

## 2014-04-14 NOTE — Telephone Encounter (Signed)
Per patient's preEPIC chart- last BMD 01-01-12 at [facility]. Note from Dr Sabra Heck states BMD was stable, still osteopenia. fracture list was still low. Repeat BMD in 2-3 years.  Call to patient, [facility], ask for triage nurse.

## 2014-04-14 NOTE — Patient Instructions (Signed)
[facility] Discharge Instructions for Patients Receiving Chemotherapy  Today you received the following chemotherapy agents Herceptin  To help prevent nausea and vomiting after your treatment, we encourage you to take your nausea medication     If you develop nausea and vomiting that is not controlled by your nausea medication, call the clinic.   BELOW ARE SYMPTOMS THAT SHOULD BE REPORTED IMMEDIATELY:  *FEVER GREATER THAN 100.5 F  *CHILLS WITH OR WITHOUT FEVER  NAUSEA AND VOMITING THAT IS NOT CONTROLLED WITH YOUR NAUSEA MEDICATION  *UNUSUAL SHORTNESS OF BREATH  *UNUSUAL BRUISING OR BLEEDING  TENDERNESS IN MOUTH AND THROAT WITH OR WITHOUT PRESENCE OF ULCERS  *URINARY PROBLEMS  *BOWEL PROBLEMS  UNUSUAL RASH Items with * indicate a potential emergency and should be followed up as soon as possible.  Feel free to call the clinic you have any questions or concerns. The clinic phone number is [phone].    

## 2014-04-17 ENCOUNTER — Other Ambulatory Visit: Payer: Self-pay | Admitting: Oncology

## 2014-04-17 DIAGNOSIS — C50919 Malignant neoplasm of unspecified site of unspecified female breast: Secondary | ICD-10-CM

## 2014-04-17 DIAGNOSIS — M8580 Other specified disorders of bone density and structure, unspecified site: Secondary | ICD-10-CM

## 2014-04-18 NOTE — Telephone Encounter (Signed)
Patient notified of date and comments from Dr Sabra Heck from last BMD.  She states Dr Jana Hakim is concerned about it and may want her to have another.  He wanted her to check on date from last BMD.  Result is not in EPIC since done at [facility] and done prior to our EPIC start date so will fax result to Dr Jana Hakim. Advised patient to let us know if she needs [place] to order BMD.  She thinks he will just order it but she will have results sent here as well. Will call me if she needs additional assistance.  Routing to provider for final review. Patient agreeable to disposition. Will close encounter

## 2014-04-20 ENCOUNTER — Telehealth: Payer: Self-pay | Admitting: Oncology

## 2014-04-20 NOTE — Telephone Encounter (Signed)
, °

## 2014-04-21 ENCOUNTER — Telehealth: Payer: Self-pay | Admitting: *Deleted

## 2014-04-21 NOTE — Telephone Encounter (Signed)
Patient called and moved her appt for 6/12 to later in the day. Patient asked to have her appt for 7/2 moved to 7/3. I have explained that we are closed on 7/3 to observe the hoilday

## 2014-05-05 ENCOUNTER — Ambulatory Visit (HOSPITAL_BASED_OUTPATIENT_CLINIC_OR_DEPARTMENT_OTHER): Payer: BC Managed Care – PPO

## 2014-05-05 ENCOUNTER — Other Ambulatory Visit: Payer: Self-pay | Admitting: Emergency Medicine

## 2014-05-05 DIAGNOSIS — C50411 Malignant neoplasm of upper-outer quadrant of right female breast: Secondary | ICD-10-CM

## 2014-05-05 DIAGNOSIS — C50419 Malignant neoplasm of upper-outer quadrant of unspecified female breast: Secondary | ICD-10-CM

## 2014-05-05 DIAGNOSIS — Z5112 Encounter for antineoplastic immunotherapy: Secondary | ICD-10-CM

## 2014-05-05 LAB — COMPREHENSIVE METABOLIC PANEL (CC13)
ALBUMIN: 3.9 g/dL (ref 3.5–5.0)
ALT: 33 U/L (ref 0–55)
ANION GAP: 8 meq/L (ref 3–11)
AST: 22 U/L (ref 5–34)
Alkaline Phosphatase: 78 U/L (ref 40–150)
BUN: 21.1 mg/dL (ref 7.0–26.0)
CO2: 25 meq/L (ref 22–29)
Calcium: 9.2 mg/dL (ref 8.4–10.4)
Chloride: 107 mEq/L (ref 98–109)
Creatinine: 1 mg/dL (ref 0.6–1.1)
GLUCOSE: 100 mg/dL (ref 70–140)
POTASSIUM: 4.3 meq/L (ref 3.5–5.1)
Sodium: 140 mEq/L (ref 136–145)
TOTAL PROTEIN: 7 g/dL (ref 6.4–8.3)
Total Bilirubin: 0.38 mg/dL (ref 0.20–1.20)

## 2014-05-05 LAB — CBC WITH DIFFERENTIAL/PLATELET
BASO%: 1.3 % (ref 0.0–2.0)
Basophils Absolute: 0.1 10*3/uL (ref 0.0–0.1)
EOS ABS: 0.3 10*3/uL (ref 0.0–0.5)
EOS%: 4.3 % (ref 0.0–7.0)
HCT: 36.5 % (ref 34.8–46.6)
HGB: 11.9 g/dL (ref 11.6–15.9)
LYMPH%: 30.5 % (ref 14.0–49.7)
MCH: 29 pg (ref 25.1–34.0)
MCHC: 32.5 g/dL (ref 31.5–36.0)
MCV: 89.2 fL (ref 79.5–101.0)
MONO#: 0.5 10*3/uL (ref 0.1–0.9)
MONO%: 7.1 % (ref 0.0–14.0)
NEUT%: 56.8 % (ref 38.4–76.8)
NEUTROS ABS: 4 10*3/uL (ref 1.5–6.5)
Platelets: 299 10*3/uL (ref 145–400)
RBC: 4.09 10*6/uL (ref 3.70–5.45)
RDW: 14.7 % — AB (ref 11.2–14.5)
WBC: 7 10*3/uL (ref 3.9–10.3)
lymph#: 2.1 10*3/uL (ref 0.9–3.3)

## 2014-05-05 MED ORDER — HEPARIN SOD (PORK) LOCK FLUSH 100 UNIT/ML IV SOLN
500.0000 [IU] | Freq: Once | INTRAVENOUS | Status: AC | PRN
  Administered 2014-05-05: 500 [IU]
  Filled 2014-05-05: qty 5

## 2014-05-05 MED ORDER — SODIUM CHLORIDE 0.9 % IJ SOLN
10.0000 mL | INTRAMUSCULAR | Status: DC | PRN
  Administered 2014-05-05: 10 mL
  Filled 2014-05-05: qty 10

## 2014-05-05 MED ORDER — DIPHENHYDRAMINE HCL 25 MG PO CAPS
25.0000 mg | ORAL_CAPSULE | Freq: Once | ORAL | Status: AC
  Administered 2014-05-05: 25 mg via ORAL

## 2014-05-05 MED ORDER — SODIUM CHLORIDE 0.9 % IV SOLN
Freq: Once | INTRAVENOUS | Status: AC
  Administered 2014-05-05: 12:00:00 via INTRAVENOUS

## 2014-05-05 MED ORDER — TRASTUZUMAB CHEMO INJECTION 440 MG
6.0000 mg/kg | Freq: Once | INTRAVENOUS | Status: AC
  Administered 2014-05-05: 441 mg via INTRAVENOUS
  Filled 2014-05-05: qty 21

## 2014-05-05 MED ORDER — ACETAMINOPHEN 325 MG PO TABS
650.0000 mg | ORAL_TABLET | Freq: Once | ORAL | Status: AC
  Administered 2014-05-05: 650 mg via ORAL

## 2014-05-05 MED ORDER — DIPHENHYDRAMINE HCL 25 MG PO CAPS
ORAL_CAPSULE | ORAL | Status: AC
  Filled 2014-05-05: qty 1

## 2014-05-05 MED ORDER — ACETAMINOPHEN 325 MG PO TABS
ORAL_TABLET | ORAL | Status: AC
  Filled 2014-05-05: qty 2

## 2014-05-05 NOTE — Patient Instructions (Signed)
[facility] Discharge Instructions for Patients Receiving Chemotherapy  Today you received the following chemotherapy agents Herceptin.  To help prevent nausea and vomiting after your treatment, we encourage you to take your nausea medication as directed.   If you develop nausea and vomiting that is not controlled by your nausea medication, call the clinic.   BELOW ARE SYMPTOMS THAT SHOULD BE REPORTED IMMEDIATELY:  *FEVER GREATER THAN 100.5 F  *CHILLS WITH OR WITHOUT FEVER  NAUSEA AND VOMITING THAT IS NOT CONTROLLED WITH YOUR NAUSEA MEDICATION  *UNUSUAL SHORTNESS OF BREATH  *UNUSUAL BRUISING OR BLEEDING  TENDERNESS IN MOUTH AND THROAT WITH OR WITHOUT PRESENCE OF ULCERS  *URINARY PROBLEMS  *BOWEL PROBLEMS  UNUSUAL RASH Items with * indicate a potential emergency and should be followed up as soon as possible.  Feel free to call the clinic you have any questions or concerns. The clinic phone number is [phone].  

## 2014-05-25 ENCOUNTER — Ambulatory Visit (HOSPITAL_BASED_OUTPATIENT_CLINIC_OR_DEPARTMENT_OTHER): Payer: BC Managed Care – PPO

## 2014-05-25 ENCOUNTER — Ambulatory Visit: Payer: BC Managed Care – PPO

## 2014-05-25 ENCOUNTER — Other Ambulatory Visit: Payer: Self-pay | Admitting: Physician Assistant

## 2014-05-25 VITALS — BP 137/63 | HR 78 | Temp 98.6°F | Resp 18

## 2014-05-25 DIAGNOSIS — C50411 Malignant neoplasm of upper-outer quadrant of right female breast: Secondary | ICD-10-CM

## 2014-05-25 DIAGNOSIS — Z5112 Encounter for antineoplastic immunotherapy: Secondary | ICD-10-CM

## 2014-05-25 DIAGNOSIS — C50419 Malignant neoplasm of upper-outer quadrant of unspecified female breast: Secondary | ICD-10-CM

## 2014-05-25 LAB — CBC WITH DIFFERENTIAL/PLATELET
BASO%: 1.2 % (ref 0.0–2.0)
Basophils Absolute: 0.1 10*3/uL (ref 0.0–0.1)
EOS ABS: 0.2 10*3/uL (ref 0.0–0.5)
EOS%: 3.5 % (ref 0.0–7.0)
HEMATOCRIT: 36.5 % (ref 34.8–46.6)
HGB: 12.1 g/dL (ref 11.6–15.9)
LYMPH#: 1.6 10*3/uL (ref 0.9–3.3)
LYMPH%: 25.2 % (ref 14.0–49.7)
MCH: 28.7 pg (ref 25.1–34.0)
MCHC: 33.1 g/dL (ref 31.5–36.0)
MCV: 86.6 fL (ref 79.5–101.0)
MONO#: 0.4 10*3/uL (ref 0.1–0.9)
MONO%: 6.9 % (ref 0.0–14.0)
NEUT%: 63.2 % (ref 38.4–76.8)
NEUTROS ABS: 4.1 10*3/uL (ref 1.5–6.5)
Platelets: 286 10*3/uL (ref 145–400)
RBC: 4.21 10*6/uL (ref 3.70–5.45)
RDW: 14.8 % — ABNORMAL HIGH (ref 11.2–14.5)
WBC: 6.5 10*3/uL (ref 3.9–10.3)

## 2014-05-25 MED ORDER — DIPHENHYDRAMINE HCL 25 MG PO CAPS
ORAL_CAPSULE | ORAL | Status: AC
  Filled 2014-05-25: qty 1

## 2014-05-25 MED ORDER — ACETAMINOPHEN 325 MG PO TABS
650.0000 mg | ORAL_TABLET | Freq: Once | ORAL | Status: AC
  Administered 2014-05-25: 650 mg via ORAL

## 2014-05-25 MED ORDER — SODIUM CHLORIDE 0.9 % IV SOLN
Freq: Once | INTRAVENOUS | Status: AC
  Administered 2014-05-25: 09:00:00 via INTRAVENOUS

## 2014-05-25 MED ORDER — ACETAMINOPHEN 325 MG PO TABS
ORAL_TABLET | ORAL | Status: AC
Start: 2014-05-25 — End: 2014-05-25
  Filled 2014-05-25: qty 2

## 2014-05-25 MED ORDER — HEPARIN SOD (PORK) LOCK FLUSH 100 UNIT/ML IV SOLN
500.0000 [IU] | Freq: Once | INTRAVENOUS | Status: AC | PRN
  Administered 2014-05-25: 500 [IU]
  Filled 2014-05-25: qty 5

## 2014-05-25 MED ORDER — DIPHENHYDRAMINE HCL 25 MG PO CAPS
25.0000 mg | ORAL_CAPSULE | Freq: Once | ORAL | Status: AC
  Administered 2014-05-25: 25 mg via ORAL

## 2014-05-25 MED ORDER — SODIUM CHLORIDE 0.9 % IJ SOLN
10.0000 mL | INTRAMUSCULAR | Status: DC | PRN
  Administered 2014-05-25: 10 mL
  Filled 2014-05-25: qty 10

## 2014-05-25 MED ORDER — TRASTUZUMAB CHEMO INJECTION 440 MG
6.0000 mg/kg | Freq: Once | INTRAVENOUS | Status: AC
  Administered 2014-05-25: 441 mg via INTRAVENOUS
  Filled 2014-05-25: qty 21

## 2014-05-25 NOTE — Patient Instructions (Signed)
[facility] Discharge Instructions for Patients Receiving Chemotherapy  Today you received the following chemotherapy agents Herceptin.  To help prevent nausea and vomiting after your treatment, we encourage you to take your nausea medication as directed.   If you develop nausea and vomiting that is not controlled by your nausea medication, call the clinic.   BELOW ARE SYMPTOMS THAT SHOULD BE REPORTED IMMEDIATELY:  *FEVER GREATER THAN 100.5 F  *CHILLS WITH OR WITHOUT FEVER  NAUSEA AND VOMITING THAT IS NOT CONTROLLED WITH YOUR NAUSEA MEDICATION  *UNUSUAL SHORTNESS OF BREATH  *UNUSUAL BRUISING OR BLEEDING  TENDERNESS IN MOUTH AND THROAT WITH OR WITHOUT PRESENCE OF ULCERS  *URINARY PROBLEMS  *BOWEL PROBLEMS  UNUSUAL RASH Items with * indicate a potential emergency and should be followed up as soon as possible.  Feel free to call the clinic you have any questions or concerns. The clinic phone number is [phone].  

## 2014-06-09 ENCOUNTER — Ambulatory Visit (HOSPITAL_COMMUNITY)
Admission: RE | Admit: 2014-06-09 | Discharge: 2014-06-09 | Disposition: A | Discharge status: Home / Self Care | Payer: BC Managed Care – PPO | Source: Ambulatory Visit | Attending: Oncology | Admitting: Oncology

## 2014-06-09 DIAGNOSIS — M8580 Other specified disorders of bone density and structure, unspecified site: Secondary | ICD-10-CM

## 2014-06-09 DIAGNOSIS — I369 Nonrheumatic tricuspid valve disorder, unspecified: Secondary | ICD-10-CM

## 2014-06-09 DIAGNOSIS — I079 Rheumatic tricuspid valve disease, unspecified: Secondary | ICD-10-CM | POA: Insufficient documentation

## 2014-06-09 DIAGNOSIS — F411 Generalized anxiety disorder: Secondary | ICD-10-CM | POA: Insufficient documentation

## 2014-06-09 DIAGNOSIS — C50919 Malignant neoplasm of unspecified site of unspecified female breast: Secondary | ICD-10-CM | POA: Insufficient documentation

## 2014-06-09 DIAGNOSIS — K219 Gastro-esophageal reflux disease without esophagitis: Secondary | ICD-10-CM | POA: Insufficient documentation

## 2014-06-09 NOTE — Progress Notes (Signed)
*  PRELIMINARY RESULTS* Echocardiogram 2D Echocardiogram has been performed.  Barbara [name] 06/09/2014, 11:59 AM

## 2014-06-12 ENCOUNTER — Other Ambulatory Visit (HOSPITAL_COMMUNITY): Payer: BC Managed Care – PPO

## 2014-06-16 ENCOUNTER — Other Ambulatory Visit: Payer: Self-pay | Admitting: *Deleted

## 2014-06-16 ENCOUNTER — Other Ambulatory Visit (HOSPITAL_BASED_OUTPATIENT_CLINIC_OR_DEPARTMENT_OTHER): Payer: BC Managed Care – PPO

## 2014-06-16 ENCOUNTER — Encounter: Payer: Self-pay | Admitting: Hematology

## 2014-06-16 ENCOUNTER — Ambulatory Visit (HOSPITAL_BASED_OUTPATIENT_CLINIC_OR_DEPARTMENT_OTHER): Payer: BC Managed Care – PPO

## 2014-06-16 ENCOUNTER — Ambulatory Visit (HOSPITAL_BASED_OUTPATIENT_CLINIC_OR_DEPARTMENT_OTHER): Payer: BC Managed Care – PPO | Admitting: Hematology

## 2014-06-16 VITALS — BP 128/68 | HR 69 | Temp 98.3°F | Resp 20 | Ht 66.5 in | Wt 163.7 lb

## 2014-06-16 DIAGNOSIS — C50419 Malignant neoplasm of upper-outer quadrant of unspecified female breast: Secondary | ICD-10-CM

## 2014-06-16 DIAGNOSIS — Z170 Estrogen receptor positive status [ER+]: Secondary | ICD-10-CM

## 2014-06-16 DIAGNOSIS — C50411 Malignant neoplasm of upper-outer quadrant of right female breast: Secondary | ICD-10-CM

## 2014-06-16 DIAGNOSIS — C50919 Malignant neoplasm of unspecified site of unspecified female breast: Secondary | ICD-10-CM

## 2014-06-16 DIAGNOSIS — Z5112 Encounter for antineoplastic immunotherapy: Secondary | ICD-10-CM

## 2014-06-16 LAB — CBC WITH DIFFERENTIAL/PLATELET
BASO%: 1 % (ref 0.0–2.0)
BASOS ABS: 0.1 10*3/uL (ref 0.0–0.1)
EOS%: 3.5 % (ref 0.0–7.0)
Eosinophils Absolute: 0.2 10*3/uL (ref 0.0–0.5)
HEMATOCRIT: 38.3 % (ref 34.8–46.6)
HGB: 12.2 g/dL (ref 11.6–15.9)
LYMPH#: 1.7 10*3/uL (ref 0.9–3.3)
LYMPH%: 28.2 % (ref 14.0–49.7)
MCH: 27.4 pg (ref 25.1–34.0)
MCHC: 31.8 g/dL (ref 31.5–36.0)
MCV: 86.1 fL (ref 79.5–101.0)
MONO#: 0.4 10*3/uL (ref 0.1–0.9)
MONO%: 6.7 % (ref 0.0–14.0)
NEUT#: 3.7 10*3/uL (ref 1.5–6.5)
NEUT%: 60.6 % (ref 38.4–76.8)
PLATELETS: 238 10*3/uL (ref 145–400)
RBC: 4.45 10*6/uL (ref 3.70–5.45)
RDW: 15 % — ABNORMAL HIGH (ref 11.2–14.5)
WBC: 6.1 10*3/uL (ref 3.9–10.3)

## 2014-06-16 MED ORDER — HEPARIN SOD (PORK) LOCK FLUSH 100 UNIT/ML IV SOLN
500.0000 [IU] | Freq: Once | INTRAVENOUS | Status: AC | PRN
  Administered 2014-06-16: 500 [IU]
  Filled 2014-06-16: qty 5

## 2014-06-16 MED ORDER — SODIUM CHLORIDE 0.9 % IJ SOLN
10.0000 mL | INTRAMUSCULAR | Status: DC | PRN
  Administered 2014-06-16: 10 mL
  Filled 2014-06-16: qty 10

## 2014-06-16 MED ORDER — ACETAMINOPHEN 325 MG PO TABS
650.0000 mg | ORAL_TABLET | Freq: Once | ORAL | Status: AC
  Administered 2014-06-16: 650 mg via ORAL

## 2014-06-16 MED ORDER — DIPHENHYDRAMINE HCL 25 MG PO CAPS
25.0000 mg | ORAL_CAPSULE | Freq: Once | ORAL | Status: AC
Start: 2014-06-16 — End: 2014-06-16
  Administered 2014-06-16: 25 mg via ORAL

## 2014-06-16 MED ORDER — TRASTUZUMAB CHEMO INJECTION 440 MG
6.0000 mg/kg | Freq: Once | INTRAVENOUS | Status: AC
  Administered 2014-06-16: 441 mg via INTRAVENOUS
  Filled 2014-06-16: qty 21

## 2014-06-16 MED ORDER — ACETAMINOPHEN 325 MG PO TABS
ORAL_TABLET | ORAL | Status: AC
  Filled 2014-06-16: qty 2

## 2014-06-16 MED ORDER — ANASTROZOLE 1 MG PO TABS: 1.0000 mg | ORAL_TABLET | Freq: Every day | ORAL | Status: DC

## 2014-06-16 MED ORDER — SODIUM CHLORIDE 0.9 % IV SOLN
Freq: Once | INTRAVENOUS | Status: AC
  Administered 2014-06-16: 12:00:00 via INTRAVENOUS

## 2014-06-16 MED ORDER — DIPHENHYDRAMINE HCL 25 MG PO CAPS
ORAL_CAPSULE | ORAL | Status: AC
  Filled 2014-06-16: qty 1

## 2014-06-16 NOTE — Progress Notes (Signed)
ID: Barbara [name] OB: Sep 06, 1951  MR#: [id number]  QMV#:[id number]  PCP: [name], MD GYN:  Felipa Emory SU: Star Age OTHER MW:[id number] Blair Heys  CHIEF COMPLAINT: Right Breast Cancer her 2 neu +/  Under active treatment with Herceptin, to start Arimidex today  BREAST CANCER HISTORY: The patient has a very strong family history for breast cancer, although she has tested  negative for the BRCA mutations (as have 2 of her sisters). She had been receiving breast MRI in addition to mammography, but had not had this test performed since October of 2010. More recently, 07/22/2013, routine screening mammography with tomography at [facility] showed no worrisome findings in the setting of extremely dense breasts.  MRI of the breast 09/08/2013, however, showed in the right upper outer quadrant an irregular mass measuring 1.7 cm. This was new as compared to the prior MRI from 2010. There were no abnormal lymph nodes and no other areas of concern in either breast. Ultrasound-guided biopsy of this mass 09/21/2013 showed ([id number]) and invasive ductal carcinoma, grade 2, estrogen and progesterone receptor positive, HER-2 amplified by CISH with a ratio of 5.55 (average HER-2 copy number Purcell of 13.05). The MIB-1 was 25%. It was a clinical T1c N0, stage IA invasive ductal carcinoma, grade 2, triple positive. She is status post bilateral mastectomies 10/13/2013 showing: (a) on the left, atypical ductal hyperplasia (b) on the right, a pT1c pN0, stage IA invasive ductal carcinoma, grade 2. She received paclitaxel/trastuzumab, with paclitaxel given on days 1, 8, and 15 and trastuzumab given every 2 weeks on days 1 and 15 of each 28 day cycle, for 4 cycles (12 doses of paclitaxel) completed 03/24/2014. She is continuing trastuzumab alone every three weeks to complete a year (through mid-January 2016); Echocardiogram 05/30/2014 shows a well preserved ejection fraction of 60-65%.The patient  participated in the Star study remotely, completing 5 years of tamoxifen in the late 1990s. She will start anastrozole today and a prescription was called in.  She received definitive implants June 2015 with Dr Harlow Mares.   INTERVAL HISTORY: Barbara [name] returns today for followup of her right breast cancer.  Since the last visit here she has completed her adjuvant paclitaxel, which she tolerated well. She did not develop any peripheral neuropathy. Of course she is continuing on anti-HER-2 treatment, namely trastuzumab alone, and she will receive her treatment today and every 3 weeks through January 2016. Patient had an echocardiogram performed on 06/09/2014 showing an ejection fraction of 60-65% she also underwent a bone mineral density study on 06/09/2014 showing a T score of -1.6 in the right femur neck. Previously the T score was -1.7 on 01/01/2012 so findings are stable. There is an interval decrease in bone density in the lumbar spine is -11.8%. Overall patient is doing well and she is in good spirits.  REVIEW OF SYSTEMS: [name] is pleased that her hair is already starting to grow back and it is not completely quite. She's picking up some energy. She has started an exercise program, mostly walking, but would like to do some weights as well. She does have vaginal dryness issues. Hot flashes are not a concern. A detailed review of systems today was otherwise entirely negative.  PAST MEDICAL HISTORY: Past Medical History  Diagnosis Date  . Breast cancer   . Hashimoto's thyroiditis     with goiter  . Hypercholesteremia   . 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  Narrowing of the 4th and 5th lumbar interspaces.   Marland Kitchen Spina bifida     occutta L5  . Anxiety   . GERD (gastroesophageal reflux disease)   . Headache(784.0)     hx migraines  . Varicose vein     PAST SURGICAL HISTORY: Past Surgical History  Procedure Laterality Date  . Hernia repair    .  Tubal ligation    . Cystoscopy with urethral caruncle    . Mastectomy w/ sentinel node biopsy Bilateral 10/13/2013    Procedure:  BILATERAL MASTECTOMY WITH RIGHT SENTINEL LYMPH NODE BIOPSY;  Surgeon: Merrie Roof, MD;  Location: [place];  Service: General;  Laterality: Bilateral;  . Portacath placement Left 10/13/2013    Procedure: INSERTION PORT-A-CATH;  Surgeon: Merrie Roof, MD;  Location: [place];  Service: General;  Laterality: Left;  . Breast reconstruction with placement of tissue expander and flex hd (acellular hydrated dermis) Bilateral 10/13/2013    Procedure: PLACEMENT OF BILATERAL TISSUE EXPANDER FOR BREAST RECONSTRUCTION ;  Surgeon: Crissie Reese, MD;  Location: [place];  Service: Plastics;  Laterality: Bilateral;    FAMILY HISTORY Family History  Problem Relation Age of Onset  . Breast cancer Mother 96  . Lung cancer Father   . Breast cancer Sister 33    Bilateral breast cancer with 2nd dx at 39  . Breast cancer Maternal Aunt 52  . Breast cancer Sister 79  . Lung cancer Maternal Uncle   . Leukemia Paternal Uncle   . Melanoma Brother 52    on ear  . Prostate cancer Brother 20  . Cancer Cousin     maternal cousin with cancer - NOS  The patient's mother was diagnosed with breast cancer the age of 71. She died at the age of 3. The patient's father died at the age of 45, with a history of lung cancer. The patient had 2 brothers, and 2 sisters. The patient's sister Barbara [name] has a history of bilateral breast cancer in her sister Barbara [name] a history of left breast cancer. Both have been tested and are found to be negative for a BRCA one or 2 mutation. The patient's mother's sister also had a history of breast cancer. The patient herself has been tested for the BRCA mutation as well as through [facility]. No mutation has been file.   GYNECOLOGIC HISTORY:  Menarche age 48, first live birth age 67. The patient is GX P2. She stopped having periods in her early 27s. She did not take hormone  replacement.  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 Her husband Barbara [name] used to work for [employer]. Daughter [name] works as a Music therapist in [place]. Son [name] is a drug representative for [employer] (diabetes). His wife, and December, is a Ship broker in [place] emergency room's. The patient has 5 grandchildren. She at tends [place]    ADVANCED DIRECTIVES:  In place   HEALTH MAINTENANCE:  (Updated 02/10/2014) History  Substance Use Topics  . Smoking status: Never Smoker   . Smokeless tobacco: Never Used  . Alcohol Use: Yes     Comment: rarely     Colonoscopy: 2013/ [facility]  PAP: 2013/Dr. Sabra Heck  Bone density: 2013/osteopenia  Lipid panel: Not on file/Dr. Laurann Montana   Allergies  Allergen Reactions  . Buprenex [Buprenorphine] Nausea And Vomiting and Other (See Comments)    Oversedation  . Simvastatin Other (See  Comments)    Myalgias   . Codeine Hives, Itching and Rash    Current Outpatient Prescriptions  Medication Sig Dispense Refill  . aspirin 81 MG tablet Take 81 mg by mouth daily.      Marland Kitchen atorvastatin (LIPITOR) 10 MG tablet Take 10 mg by mouth at bedtime.       . Calcium-Vitamin D-Vitamin K (VIACTIV) 291-916-60 MG-UNT-MCG CHEW Chew 1 tablet by mouth.       . ergocalciferol (VITAMIN D2) 50000 UNITS capsule Take 50,000 Units by mouth once a week.      . fluticasone (FLONASE) 50 MCG/ACT nasal spray Place 2 sprays into both nostrils daily.      Marland Kitchen levothyroxine (SYNTHROID, LEVOTHROID) 50 MCG tablet Take 50 mcg by mouth daily before breakfast. Take 50 mcg 1 hours before breakfast.      . lidocaine-prilocaine (EMLA) cream Apply 1 application topically as needed.  30 g  0  . Nutritional Supplements (JUICE PLUS FIBRE) LIQD Take by mouth.      Marland Kitchen PARoxetine (PAXIL) 20 MG tablet Take 20 mg by mouth at bedtime.       . prochlorperazine (COMPAZINE) 10 MG  tablet       . anastrozole (ARIMIDEX) 1 MG tablet Take 1 tablet (1 mg total) by mouth daily.  90 tablet  3   No current facility-administered medications for this visit.    OBJECTIVE:  Middle-aged white woman who appears stated age 26 Vitals:   06/16/14 0942  BP: 128/68  Pulse: 69  Temp: 98.3 F (36.8 C)  Resp: 20  Body mass index is 26.03 kg/(m^2).   ECOG:  1 Filed Weights   06/16/14 0942  Weight: 163 lb 11.2 oz (74.254 kg)   Sclerae unicteric, EOMs intact Oropharynx clear and moister lesions No cervical or supraclavicular adenopathy Lungs no rales or rhonchi Heart regular rate and rhythm Abd soft, nontender, positive bowel sounds MSK no focal spinal tenderness, no upper extremity lymphedema Neuro: nonfocal, well oriented, positive affect Breasts: Status post bilateral mastectomies with expanders in place; no evidence of local recurrence; both axillae are benign  LAB RESULTS:   Lab Results  Component Value Date   WBC 6.1 06/16/2014   NEUTROABS 3.7 06/16/2014   HGB 12.2 06/16/2014   HCT 38.3 06/16/2014   MCV 86.1 06/16/2014   PLT 238 06/16/2014      Chemistry      Component Value Date/Time   NA 140 05/05/2014 1139   NA 138 10/12/2013 0940   K 4.3 05/05/2014 1139   K 4.3 10/12/2013 0940   CL 102 10/12/2013 0940   CO2 25 05/05/2014 1139   CO2 27 10/12/2013 0940   BUN 21.1 05/05/2014 1139   BUN 18 10/12/2013 0940   CREATININE 1.0 05/05/2014 1139   CREATININE 0.91 10/12/2013 0940      Component Value Date/Time   CALCIUM 9.2 05/05/2014 1139   CALCIUM 9.7 10/12/2013 0940   ALKPHOS 78 05/05/2014 1139   AST 22 05/05/2014 1139   ALT 33 05/05/2014 1139   BILITOT 0.38 05/05/2014 1139      STUDIES:  Most recent echocardiogram on  06/09/2014  showed an ejection fraction of 60%-65%.    ASSESSMENT: 63 y.o. BRCA negative [place] woman   (1)  status post right breast biopsy 09/21/2013 for a clinical T1c N0, stage IA invasive ductal carcinoma, grade 2, triple positive,  with a HER-2: CEP 17 ratio of 5.55 and an average copy number per cell of  13.05. The  MIB-1-1 was 25%  (2) status post bilateral mastectomies 10/13/2013 showing:  (a) on the left, atypical ductal hyperplasia  (b) on the right, a pT1c pN0, stage IA invasive ductal carcinoma, grade 2  (3) received paclitaxel/trastuzumab, with paclitaxel given on days 1, 8, and 15 and trastuzumab given every 2 weeks on days 1 and 15 of each 28 day cycle, for 4 cycles (12 doses of paclitaxel) completed 03/24/2014  (4) continuing trastuzumab alone every three weeks to complete a year (through mid-January 2016); echocardiogram July 2015 shows a well preserved ejection fraction   (5 The patient participated in the Star study remotely, completing 5 years of tamoxifen in the late 1990s  (6 start anastrozole Today 06/16/2014.  (7) definitive implant placement done June 2015 with Dr Harlow Mares.  PLAN:  1. Today we discussed antiestrogen therapy, and she will start anastrozole 1 mg daily. She has a good understanding of the possible toxicities, side effects and complications of this agent. She also understands the benefits. Bone density to be checked q 2 years and lipid profile q 6 months.I gave her written notes on Arimidex and it's side effects.  2. She is now taking weekly high dose of Vitamin D and will be taking Caltrate twice a day.  3. Her labs physical exam were normal today and she will get herceptin today. Continue cardiac monitoring next echo will be in October 2015.  4. RTC in 2 months to see how she is tolerating Arimidex.  Carollee has a good understanding of the overall plan. She agrees with it. She knows the goal of treatment in her case is cure. She will call with any problems that may develop before her next visit here.     Bernadene Bell, MD Medical Hematologist/Oncologist [place] Pager: [phone] Office No: [phone]  Glean Salvo, MD   06/16/2014 6:03 PM

## 2014-06-16 NOTE — Patient Instructions (Signed)
[facility] Discharge Instructions for Patients Receiving Chemotherapy  Today you received the following chemotherapy agents Herceptin.  To help prevent nausea and vomiting after your treatment, we encourage you to take your nausea medication as directed.   If you develop nausea and vomiting that is not controlled by your nausea medication, call the clinic.   BELOW ARE SYMPTOMS THAT SHOULD BE REPORTED IMMEDIATELY:  *FEVER GREATER THAN 100.5 F  *CHILLS WITH OR WITHOUT FEVER  NAUSEA AND VOMITING THAT IS NOT CONTROLLED WITH YOUR NAUSEA MEDICATION  *UNUSUAL SHORTNESS OF BREATH  *UNUSUAL BRUISING OR BLEEDING  TENDERNESS IN MOUTH AND THROAT WITH OR WITHOUT PRESENCE OF ULCERS  *URINARY PROBLEMS  *BOWEL PROBLEMS  UNUSUAL RASH Items with * indicate a potential emergency and should be followed up as soon as possible.  Feel free to call the clinic you have any questions or concerns. The clinic phone number is [phone].  

## 2014-06-19 ENCOUNTER — Telehealth: Payer: Self-pay | Admitting: Oncology

## 2014-06-19 NOTE — Telephone Encounter (Signed)
s,.w. pt and advised on OCT appt...pt ok and aware...emailed DR GM to add more to tx plan. once added i will contact pt with tx appts.

## 2014-06-20 ENCOUNTER — Other Ambulatory Visit: Payer: Self-pay | Admitting: Oncology

## 2014-06-22 ENCOUNTER — Telehealth: Payer: Self-pay | Admitting: Hematology

## 2014-06-22 NOTE — Telephone Encounter (Signed)
returned pt call and s.w pt advised on Aug thru NOV appt...mailed pt appt sched/avs and letter.

## 2014-07-07 ENCOUNTER — Ambulatory Visit (HOSPITAL_BASED_OUTPATIENT_CLINIC_OR_DEPARTMENT_OTHER): Payer: BC Managed Care – PPO

## 2014-07-07 ENCOUNTER — Other Ambulatory Visit: Payer: Self-pay | Admitting: Hematology and Oncology

## 2014-07-07 VITALS — BP 149/75 | HR 71 | Temp 98.6°F

## 2014-07-07 DIAGNOSIS — C50419 Malignant neoplasm of upper-outer quadrant of unspecified female breast: Secondary | ICD-10-CM

## 2014-07-07 DIAGNOSIS — C50411 Malignant neoplasm of upper-outer quadrant of right female breast: Secondary | ICD-10-CM

## 2014-07-07 DIAGNOSIS — Z5112 Encounter for antineoplastic immunotherapy: Secondary | ICD-10-CM

## 2014-07-07 MED ORDER — ACETAMINOPHEN 325 MG PO TABS
650.0000 mg | ORAL_TABLET | Freq: Once | ORAL | Status: AC
  Administered 2014-07-07: 650 mg via ORAL

## 2014-07-07 MED ORDER — DIPHENHYDRAMINE HCL 25 MG PO CAPS
ORAL_CAPSULE | ORAL | Status: AC
  Filled 2014-07-07: qty 1

## 2014-07-07 MED ORDER — TRASTUZUMAB CHEMO INJECTION 440 MG
6.0000 mg/kg | Freq: Once | INTRAVENOUS | Status: AC
  Administered 2014-07-07: 441 mg via INTRAVENOUS
  Filled 2014-07-07: qty 21

## 2014-07-07 MED ORDER — SODIUM CHLORIDE 0.9 % IV SOLN
Freq: Once | INTRAVENOUS | Status: AC
  Administered 2014-07-07: 09:00:00 via INTRAVENOUS

## 2014-07-07 MED ORDER — HEPARIN SOD (PORK) LOCK FLUSH 100 UNIT/ML IV SOLN
500.0000 [IU] | Freq: Once | INTRAVENOUS | Status: AC | PRN
  Administered 2014-07-07: 500 [IU]
  Filled 2014-07-07: qty 5

## 2014-07-07 MED ORDER — ACETAMINOPHEN 325 MG PO TABS
ORAL_TABLET | ORAL | Status: AC
  Filled 2014-07-07: qty 2

## 2014-07-07 MED ORDER — DIPHENHYDRAMINE HCL 25 MG PO CAPS
25.0000 mg | ORAL_CAPSULE | Freq: Once | ORAL | Status: AC
  Administered 2014-07-07: 25 mg via ORAL

## 2014-07-07 MED ORDER — SODIUM CHLORIDE 0.9 % IJ SOLN
10.0000 mL | INTRAMUSCULAR | Status: DC | PRN
  Administered 2014-07-07: 10 mL
  Filled 2014-07-07: qty 10

## 2014-07-07 NOTE — Patient Instructions (Signed)

## 2014-07-14 ENCOUNTER — Encounter (INDEPENDENT_AMBULATORY_CARE_PROVIDER_SITE_OTHER): Payer: Self-pay | Admitting: General Surgery

## 2014-07-14 ENCOUNTER — Ambulatory Visit (INDEPENDENT_AMBULATORY_CARE_PROVIDER_SITE_OTHER): Payer: BC Managed Care – PPO | Admitting: General Surgery

## 2014-07-14 VITALS — BP 126/70 | HR 77 | Temp 98.3°F | Resp 16 | Ht 66.0 in | Wt 162.8 lb

## 2014-07-14 DIAGNOSIS — C50419 Malignant neoplasm of upper-outer quadrant of unspecified female breast: Secondary | ICD-10-CM

## 2014-07-14 DIAGNOSIS — C50411 Malignant neoplasm of upper-outer quadrant of right female breast: Secondary | ICD-10-CM

## 2014-07-14 NOTE — Progress Notes (Signed)
Subjective:     Patient ID: Barbara [name], female   DOB: [birth year], 63 y.o.   MRN: [id number]  HPI The patient is a 63 year old white female who is 8 months status post bilateral mastectomies and  Right sentinel lymph node biopsy for a T1 C. N0 right  breast cancer. She has finished regular chemotherapy and is still taking Herceptin. She has also started anastrozole and is tolerating it well. She has no complaints today.  Review of Systems  Constitutional: Negative.   HENT: Negative.   Eyes: Negative.   Respiratory: Negative.   Cardiovascular: Negative.   Gastrointestinal: Negative.   Endocrine: Negative.   Genitourinary: Negative.   Musculoskeletal: Negative.   Skin: Negative.   Allergic/Immunologic: Negative.   Neurological: Negative.   Hematological: Negative.   Psychiatric/Behavioral: Negative.        Objective:   Physical Exam  Constitutional: She is oriented to person, place, and time. She appears well-developed and well-nourished.  HENT:  Head: Normocephalic and atraumatic.  Eyes: Conjunctivae and EOM are normal. Pupils are equal, round, and reactive to light.  Neck: Normal range of motion. Neck supple.  Cardiovascular: Normal rate, regular rhythm and normal heart sounds.   Pulmonary/Chest: Effort normal and breath sounds normal.  There is no palpable mass in either reconstructed breast. There is no palpable axillary, Supraclavicular, or cervical lymphadenopathy.  Abdominal: Soft. Bowel sounds are normal.  Musculoskeletal: Normal range of motion.  Lymphadenopathy:    She has no cervical adenopathy.  Neurological: She is alert and oriented to person, place, and time.  Skin: Skin is warm and dry.  Psychiatric: She has a normal mood and affect. Her behavior is normal.       Assessment:     The patient is 8 months status post bilateral mastectomies for a right-sided breast cancer     Plan:     At this point She will continue Herceptin therapy. She will continue  to take anastrozole. She will continue to do regular self exams. I will plan to see her back in 6 months.

## 2014-07-14 NOTE — Patient Instructions (Signed)
Continue arimidex Continue regular self exams

## 2014-07-28 ENCOUNTER — Ambulatory Visit (HOSPITAL_BASED_OUTPATIENT_CLINIC_OR_DEPARTMENT_OTHER): Payer: 59

## 2014-07-28 ENCOUNTER — Other Ambulatory Visit: Payer: Self-pay | Admitting: Hematology

## 2014-07-28 VITALS — BP 139/67 | HR 70 | Temp 98.3°F | Resp 20

## 2014-07-28 DIAGNOSIS — Z5112 Encounter for antineoplastic immunotherapy: Secondary | ICD-10-CM | POA: Diagnosis not present

## 2014-07-28 DIAGNOSIS — C50411 Malignant neoplasm of upper-outer quadrant of right female breast: Secondary | ICD-10-CM

## 2014-07-28 DIAGNOSIS — C50419 Malignant neoplasm of upper-outer quadrant of unspecified female breast: Secondary | ICD-10-CM

## 2014-07-28 MED ORDER — SODIUM CHLORIDE 0.9 % IV SOLN
Freq: Once | INTRAVENOUS | Status: AC
  Administered 2014-07-28: 10:00:00 via INTRAVENOUS

## 2014-07-28 MED ORDER — DIPHENHYDRAMINE HCL 25 MG PO CAPS
ORAL_CAPSULE | ORAL | Status: AC
  Filled 2014-07-28: qty 1

## 2014-07-28 MED ORDER — HEPARIN SOD (PORK) LOCK FLUSH 100 UNIT/ML IV SOLN
500.0000 [IU] | Freq: Once | INTRAVENOUS | Status: AC | PRN
  Administered 2014-07-28: 500 [IU]
  Filled 2014-07-28: qty 5

## 2014-07-28 MED ORDER — SODIUM CHLORIDE 0.9 % IJ SOLN
10.0000 mL | INTRAMUSCULAR | Status: DC | PRN
  Administered 2014-07-28: 10 mL
  Filled 2014-07-28: qty 10

## 2014-07-28 MED ORDER — ACETAMINOPHEN 325 MG PO TABS
650.0000 mg | ORAL_TABLET | Freq: Once | ORAL | Status: AC
  Administered 2014-07-28: 650 mg via ORAL

## 2014-07-28 MED ORDER — DIPHENHYDRAMINE HCL 25 MG PO CAPS
25.0000 mg | ORAL_CAPSULE | Freq: Once | ORAL | Status: AC
  Administered 2014-07-28: 25 mg via ORAL

## 2014-07-28 MED ORDER — TRASTUZUMAB CHEMO INJECTION 440 MG
6.0000 mg/kg | Freq: Once | INTRAVENOUS | Status: AC
  Administered 2014-07-28: 441 mg via INTRAVENOUS
  Filled 2014-07-28: qty 21

## 2014-07-28 MED ORDER — ACETAMINOPHEN 325 MG PO TABS
ORAL_TABLET | ORAL | Status: AC
  Filled 2014-07-28: qty 2

## 2014-07-28 NOTE — Patient Instructions (Signed)
[facility] Discharge Instructions for Patients Receiving Chemotherapy  Today you received the following chemotherapy agents:  Herceptin  To help prevent nausea and vomiting after your treatment, we encourage you to take your nausea medication as ordered per MD.   If you develop nausea and vomiting that is not controlled by your nausea medication, call the clinic.   BELOW ARE SYMPTOMS THAT SHOULD BE REPORTED IMMEDIATELY:  *FEVER GREATER THAN 100.5 F  *CHILLS WITH OR WITHOUT FEVER  NAUSEA AND VOMITING THAT IS NOT CONTROLLED WITH YOUR NAUSEA MEDICATION  *UNUSUAL SHORTNESS OF BREATH  *UNUSUAL BRUISING OR BLEEDING  TENDERNESS IN MOUTH AND THROAT WITH OR WITHOUT PRESENCE OF ULCERS  *URINARY PROBLEMS  *BOWEL PROBLEMS  UNUSUAL RASH Items with * indicate a potential emergency and should be followed up as soon as possible.  Feel free to call the clinic you have any questions or concerns. The clinic phone number is [phone].    

## 2014-08-18 ENCOUNTER — Other Ambulatory Visit: Payer: Self-pay | Admitting: Oncology

## 2014-08-18 ENCOUNTER — Ambulatory Visit (HOSPITAL_BASED_OUTPATIENT_CLINIC_OR_DEPARTMENT_OTHER): Payer: 59

## 2014-08-18 VITALS — BP 127/66 | HR 70 | Temp 98.4°F | Resp 19

## 2014-08-18 DIAGNOSIS — C50411 Malignant neoplasm of upper-outer quadrant of right female breast: Secondary | ICD-10-CM

## 2014-08-18 DIAGNOSIS — Z5112 Encounter for antineoplastic immunotherapy: Secondary | ICD-10-CM

## 2014-08-18 DIAGNOSIS — C50419 Malignant neoplasm of upper-outer quadrant of unspecified female breast: Secondary | ICD-10-CM

## 2014-08-18 MED ORDER — DIPHENHYDRAMINE HCL 25 MG PO CAPS
ORAL_CAPSULE | ORAL | Status: AC
  Filled 2014-08-18: qty 1

## 2014-08-18 MED ORDER — SODIUM CHLORIDE 0.9 % IJ SOLN
10.0000 mL | INTRAMUSCULAR | Status: DC | PRN
  Administered 2014-08-18: 10 mL
  Filled 2014-08-18: qty 10

## 2014-08-18 MED ORDER — SODIUM CHLORIDE 0.9 % IV SOLN
Freq: Once | INTRAVENOUS | Status: AC
  Administered 2014-08-18: 09:00:00 via INTRAVENOUS

## 2014-08-18 MED ORDER — DIPHENHYDRAMINE HCL 25 MG PO CAPS
25.0000 mg | ORAL_CAPSULE | Freq: Once | ORAL | Status: AC
  Administered 2014-08-18: 25 mg via ORAL

## 2014-08-18 MED ORDER — ACETAMINOPHEN 325 MG PO TABS
ORAL_TABLET | ORAL | Status: AC
  Filled 2014-08-18: qty 2

## 2014-08-18 MED ORDER — ACETAMINOPHEN 325 MG PO TABS
650.0000 mg | ORAL_TABLET | Freq: Once | ORAL | Status: AC
  Administered 2014-08-18: 650 mg via ORAL

## 2014-08-18 MED ORDER — HEPARIN SOD (PORK) LOCK FLUSH 100 UNIT/ML IV SOLN
500.0000 [IU] | Freq: Once | INTRAVENOUS | Status: AC | PRN
  Administered 2014-08-18: 500 [IU]
  Filled 2014-08-18: qty 5

## 2014-08-18 MED ORDER — SODIUM CHLORIDE 0.9 % IV SOLN
6.0000 mg/kg | Freq: Once | INTRAVENOUS | Status: AC
  Administered 2014-08-18: 441 mg via INTRAVENOUS
  Filled 2014-08-18: qty 21

## 2014-08-18 NOTE — Patient Instructions (Signed)
[facility] Discharge Instructions for Patients Receiving Chemotherapy  Today you received the following chemotherapy agents: Herceptin  To help prevent nausea and vomiting after your treatment, we encourage you to take your nausea medication as prescribed by your physician.    If you develop nausea and vomiting that is not controlled by your nausea medication, call the clinic.   BELOW ARE SYMPTOMS THAT SHOULD BE REPORTED IMMEDIATELY:  *FEVER GREATER THAN 100.5 F  *CHILLS WITH OR WITHOUT FEVER  NAUSEA AND VOMITING THAT IS NOT CONTROLLED WITH YOUR NAUSEA MEDICATION  *UNUSUAL SHORTNESS OF BREATH  *UNUSUAL BRUISING OR BLEEDING  TENDERNESS IN MOUTH AND THROAT WITH OR WITHOUT PRESENCE OF ULCERS  *URINARY PROBLEMS  *BOWEL PROBLEMS  UNUSUAL RASH Items with * indicate a potential emergency and should be followed up as soon as possible.  Feel free to call the clinic you have any questions or concerns. The clinic phone number is [phone].    

## 2014-08-18 NOTE — Progress Notes (Signed)
Patient declining flu shot today and states she should get one through work on Monday. Reports slight cracking of cuticles, but no open areas or redness. Voices understanding to monitor for drainage, pain, or redness.

## 2014-09-08 ENCOUNTER — Telehealth: Payer: Self-pay | Admitting: Oncology

## 2014-09-08 ENCOUNTER — Ambulatory Visit (HOSPITAL_BASED_OUTPATIENT_CLINIC_OR_DEPARTMENT_OTHER): Payer: 59

## 2014-09-08 ENCOUNTER — Ambulatory Visit (HOSPITAL_BASED_OUTPATIENT_CLINIC_OR_DEPARTMENT_OTHER): Payer: 59 | Admitting: Physician Assistant

## 2014-09-08 ENCOUNTER — Other Ambulatory Visit: Payer: Self-pay | Admitting: Medical Oncology

## 2014-09-08 VITALS — BP 144/76 | HR 75 | Temp 99.3°F | Resp 18 | Ht 66.0 in | Wt 162.2 lb

## 2014-09-08 DIAGNOSIS — Z170 Estrogen receptor positive status [ER+]: Secondary | ICD-10-CM

## 2014-09-08 DIAGNOSIS — C50411 Malignant neoplasm of upper-outer quadrant of right female breast: Secondary | ICD-10-CM

## 2014-09-08 DIAGNOSIS — Z5112 Encounter for antineoplastic immunotherapy: Secondary | ICD-10-CM

## 2014-09-08 DIAGNOSIS — C50919 Malignant neoplasm of unspecified site of unspecified female breast: Secondary | ICD-10-CM

## 2014-09-08 LAB — CBC WITH DIFFERENTIAL/PLATELET
BASO%: 1.1 % (ref 0.0–2.0)
BASOS ABS: 0.1 10*3/uL (ref 0.0–0.1)
EOS ABS: 0.2 10*3/uL (ref 0.0–0.5)
EOS%: 3.2 % (ref 0.0–7.0)
HCT: 39.7 % (ref 34.8–46.6)
HEMOGLOBIN: 13.2 g/dL (ref 11.6–15.9)
LYMPH#: 2.1 10*3/uL (ref 0.9–3.3)
LYMPH%: 35.6 % (ref 14.0–49.7)
MCH: 28.7 pg (ref 25.1–34.0)
MCHC: 33.1 g/dL (ref 31.5–36.0)
MCV: 86.7 fL (ref 79.5–101.0)
MONO#: 0.5 10*3/uL (ref 0.1–0.9)
MONO%: 7.5 % (ref 0.0–14.0)
NEUT#: 3.2 10*3/uL (ref 1.5–6.5)
NEUT%: 52.6 % (ref 38.4–76.8)
Platelets: 278 10*3/uL (ref 145–400)
RBC: 4.58 10*6/uL (ref 3.70–5.45)
RDW: 15.8 % — ABNORMAL HIGH (ref 11.2–14.5)
WBC: 6 10*3/uL (ref 3.9–10.3)

## 2014-09-08 LAB — COMPREHENSIVE METABOLIC PANEL (CC13)
ALT: 19 U/L (ref 0–55)
ANION GAP: 7 meq/L (ref 3–11)
AST: 15 U/L (ref 5–34)
Albumin: 3.7 g/dL (ref 3.5–5.0)
Alkaline Phosphatase: 77 U/L (ref 40–150)
BUN: 25.9 mg/dL (ref 7.0–26.0)
CALCIUM: 9.6 mg/dL (ref 8.4–10.4)
CHLORIDE: 108 meq/L (ref 98–109)
CO2: 25 meq/L (ref 22–29)
CREATININE: 1 mg/dL (ref 0.6–1.1)
GLUCOSE: 64 mg/dL — AB (ref 70–140)
Potassium: 4.5 mEq/L (ref 3.5–5.1)
Sodium: 140 mEq/L (ref 136–145)
Total Bilirubin: 0.44 mg/dL (ref 0.20–1.20)
Total Protein: 7.1 g/dL (ref 6.4–8.3)

## 2014-09-08 LAB — CANCER ANTIGEN 27.29: CA 27.29: 31 U/mL (ref 0–39)

## 2014-09-08 MED ORDER — SODIUM CHLORIDE 0.9 % IV SOLN
Freq: Once | INTRAVENOUS | Status: AC
  Administered 2014-09-08: 10:00:00 via INTRAVENOUS

## 2014-09-08 MED ORDER — ACETAMINOPHEN 325 MG PO TABS
650.0000 mg | ORAL_TABLET | Freq: Once | ORAL | Status: AC
  Administered 2014-09-08: 650 mg via ORAL

## 2014-09-08 MED ORDER — DIPHENHYDRAMINE HCL 25 MG PO CAPS
25.0000 mg | ORAL_CAPSULE | Freq: Once | ORAL | Status: AC
  Administered 2014-09-08: 25 mg via ORAL

## 2014-09-08 MED ORDER — TRASTUZUMAB CHEMO INJECTION 440 MG
6.0000 mg/kg | Freq: Once | INTRAVENOUS | Status: AC
  Administered 2014-09-08: 441 mg via INTRAVENOUS
  Filled 2014-09-08: qty 21

## 2014-09-08 MED ORDER — SODIUM CHLORIDE 0.9 % IJ SOLN
10.0000 mL | INTRAMUSCULAR | Status: DC | PRN
  Administered 2014-09-08: 10 mL
  Filled 2014-09-08: qty 10

## 2014-09-08 MED ORDER — ACETAMINOPHEN 325 MG PO TABS
ORAL_TABLET | ORAL | Status: AC
  Filled 2014-09-08: qty 2

## 2014-09-08 MED ORDER — HEPARIN SOD (PORK) LOCK FLUSH 100 UNIT/ML IV SOLN
500.0000 [IU] | Freq: Once | INTRAVENOUS | Status: AC | PRN
  Administered 2014-09-08: 500 [IU]
  Filled 2014-09-08: qty 5

## 2014-09-08 MED ORDER — DIPHENHYDRAMINE HCL 25 MG PO CAPS
ORAL_CAPSULE | ORAL | Status: AC
  Filled 2014-09-08: qty 1

## 2014-09-08 NOTE — Patient Instructions (Signed)

## 2014-09-08 NOTE — Telephone Encounter (Signed)
added per Robin....pt needed lab and visit....pof was not sent

## 2014-09-09 NOTE — Patient Instructions (Signed)
Continue with labs and Herceptin as scheduled Keep the appointment for your echocardiogram as scheduled  Followup as previously scheduled with Dr. Jana Hakim on 10/12/2014

## 2014-09-09 NOTE — Progress Notes (Signed)
ID: Barbara [name] OB: 1951-11-07  MR#: [id number]  ZRA#:[id number]  PCP: [name], MD GYN:  Felipa Emory SU: Star Age OTHER LK:[id number] Blair Heys  CHIEF COMPLAINT: Right Breast Cancer her 2 neu +/  Under active treatment with Herceptin, to start Arimidex today  BREAST CANCER HISTORY: The patient has a very strong family history for breast cancer, although she has tested  negative for the BRCA mutations (as have 2 of her sisters). She had been receiving breast MRI in addition to mammography, but had not had this test performed since October of 2010. More recently, 07/22/2013, routine screening mammography with tomography at [facility] showed no worrisome findings in the setting of extremely dense breasts.  MRI of the breast 09/08/2013, however, showed in the right upper outer quadrant an irregular mass measuring 1.7 cm. This was new as compared to the prior MRI from 2010. There were no abnormal lymph nodes and no other areas of concern in either breast. Ultrasound-guided biopsy of this mass 09/21/2013 showed ([id number]) and invasive ductal carcinoma, grade 2, estrogen and progesterone receptor positive, HER-2 amplified by CISH with a ratio of 5.55 (average HER-2 copy number Purcell of 13.05). The MIB-1 was 25%. It was a clinical T1c N0, stage IA invasive ductal carcinoma, grade 2, triple positive. She is status post bilateral mastectomies 10/13/2013 showing: (a) on the left, atypical ductal hyperplasia (b) on the right, a pT1c pN0, stage IA invasive ductal carcinoma, grade 2. She received paclitaxel/trastuzumab, with paclitaxel given on days 1, 8, and 15 and trastuzumab given every 2 weeks on days 1 and 15 of each 28 day cycle, for 4 cycles (12 doses of paclitaxel) completed 03/24/2014. She is continuing trastuzumab alone every three weeks to complete a year (through mid-January 2016); Echocardiogram 05/30/2014 shows a well preserved ejection fraction of 60-65%.The patient  participated in the Star study remotely, completing 5 years of tamoxifen in the late 1990s. She will start anastrozole today and a prescription was called in.  She received definitive implants June 2015 with Dr Harlow Mares.   INTERVAL HISTORY: Barbara [name] returns today accompanied by her husband for followup of her right breast cancer.  She has completed her adjuvant paclitaxel, which she tolerated well. She did not develop any peripheral neuropathy. She is continuing on anti-HER-2 treatment, namely trastuzumab alone, and she will receive her treatment today and every 3 weeks through January 2016. She tells me she also plans to retire in January 2016. Patient had an echocardiogram performed on 06/09/2014 showing an ejection fraction of 60-65% she also underwent a bone mineral density study on 06/09/2014 showing a T score of -1.6 in the right femur neck. Previously the T score was -1.7 on 01/01/2012 so findings are stable. There is an interval decrease in bone density in the lumbar spine is -11.8%. She is due for another echocardiogram this month. She is tolerating the Arimidex without difficulty. Overall patient is doing well and she is in good spirits.  REVIEW OF SYSTEMS: She denies any issues with nausea, vomiting, diarrhea or constipation. She's had no fever or chills. She denies hot flashes.  A detailed review of systems today was otherwise entirely negative.  PAST MEDICAL HISTORY: Past Medical History  Diagnosis Date  . Breast cancer   . Hashimoto's thyroiditis     with goiter  . Hypercholesteremia   . 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  Narrowing of the 4th and 5th  lumbar interspaces.   Marland Kitchen Spina bifida     occutta L5  . Anxiety   . GERD (gastroesophageal reflux disease)   . Headache(784.0)     hx migraines  . Varicose vein     PAST SURGICAL HISTORY: Past Surgical History  Procedure Laterality Date  . Hernia repair    . Tubal ligation     . Cystoscopy with urethral caruncle    . Mastectomy w/ sentinel node biopsy Bilateral 10/13/2013    Procedure:  BILATERAL MASTECTOMY WITH RIGHT SENTINEL LYMPH NODE BIOPSY;  Surgeon: Merrie Roof, MD;  Location: [place];  Service: General;  Laterality: Bilateral;  . Portacath placement Left 10/13/2013    Procedure: INSERTION PORT-A-CATH;  Surgeon: Merrie Roof, MD;  Location: [place];  Service: General;  Laterality: Left;  . Breast reconstruction with placement of tissue expander and flex hd (acellular hydrated dermis) Bilateral 10/13/2013    Procedure: PLACEMENT OF BILATERAL TISSUE EXPANDER FOR BREAST RECONSTRUCTION ;  Surgeon: Crissie Reese, MD;  Location: [place];  Service: Plastics;  Laterality: Bilateral;    FAMILY HISTORY Family History  Problem Relation Age of Onset  . Breast cancer Mother 67  . Lung cancer Father   . Breast cancer Sister 54    Bilateral breast cancer with 2nd dx at 60  . Breast cancer Maternal Aunt 52  . Breast cancer Sister 30  . Lung cancer Maternal Uncle   . Leukemia Paternal Uncle   . Melanoma Brother 52    on ear  . Prostate cancer Brother 28  . Cancer Cousin     maternal cousin with cancer - NOS  The patient's mother was diagnosed with breast cancer the age of 13. She died at the age of 68. The patient's father died at the age of 54, with a history of lung cancer. The patient had 2 brothers, and 2 sisters. The patient's sister [name] has a history of bilateral breast cancer in her sister [name] a history of left breast cancer. Both have been tested and are found to be negative for a BRCA one or 2 mutation. The patient's mother's sister also had a history of breast cancer. The patient herself has been tested for the BRCA mutation as well as through [facility]. No mutation has been file.   GYNECOLOGIC HISTORY:  Menarche age 55, first live birth age 45. The patient is GX P2. She stopped having periods in her early 61s. She did not take hormone replacement.  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 Her husband Ed used to work for [employer]. Daughter [name] works as a Music therapist in [place]. Son [name] is a drug representative for [employer] (diabetes). His wife, and December, is a Ship broker in [place] emergency room's. The patient has 5 grandchildren. She at tends [place]    ADVANCED DIRECTIVES:  In place   HEALTH MAINTENANCE:  (Updated 02/10/2014) History  Substance Use Topics  . Smoking status: Never Smoker   . Smokeless tobacco: Never Used  . Alcohol Use: Yes     Comment: rarely     Colonoscopy: 2013/ [place]  PAP: 2013/Dr. Sabra Heck  Bone density: 2013/osteopenia  Lipid panel: Not on file/Dr. Laurann Montana   Allergies  Allergen Reactions  . Buprenex [Buprenorphine] Nausea And Vomiting and Other (See Comments)    Oversedation  . Simvastatin Other (See Comments)    Myalgias   .  Codeine Hives, Itching and Rash    Current Outpatient Prescriptions  Medication Sig Dispense Refill  . anastrozole (ARIMIDEX) 1 MG tablet Take 1 tablet (1 mg total) by mouth daily.  90 tablet  3  . aspirin 81 MG tablet Take 81 mg by mouth daily.      Marland Kitchen atorvastatin (LIPITOR) 10 MG tablet Take 10 mg by mouth at bedtime.       . Calcium-Vitamin D-Vitamin K (VIACTIV) [id number] MG-UNT-MCG CHEW Chew 1 tablet by mouth.       . ergocalciferol (VITAMIN D2) 50000 UNITS capsule Take 50,000 Units by mouth once a week.      . fluticasone (FLONASE) 50 MCG/ACT nasal spray Place 2 sprays into both nostrils daily.      Marland Kitchen levothyroxine (SYNTHROID, LEVOTHROID) 50 MCG tablet Take 50 mcg by mouth daily before breakfast. Take 50 mcg 1 hours before breakfast.      . lidocaine-prilocaine (EMLA) cream Apply 1 application topically as needed.  30 g  0  . Nutritional Supplements (JUICE PLUS FIBRE) LIQD Take by mouth.      Marland Kitchen PARoxetine (PAXIL) 20 MG tablet Take 20  mg by mouth at bedtime.        No current facility-administered medications for this visit.    OBJECTIVE:  Middle-aged white woman who appears stated age 63 Vitals:   09/08/14 0939  BP: 144/76  Pulse: 75  Temp: 99.3 F (37.4 C)  Resp: 18  Body mass index is 26.19 kg/(m^2).   ECOG:  1 Filed Weights   09/08/14 0939  Weight: 162 lb 3.2 oz (73.573 kg)   Sclerae unicteric, EOMs intact Oropharynx clear and moister lesions No cervical or supraclavicular adenopathy Lungs no rales or rhonchi Heart regular rate and rhythm Abd soft, nontender, positive bowel sounds MSK no focal spinal tenderness, no upper extremity lymphedema Neuro: nonfocal, well oriented, positive affect Breasts: Deferred   LAB RESULTS:   Lab Results  Component Value Date   WBC 6.0 09/08/2014   NEUTROABS 3.2 09/08/2014   HGB 13.2 09/08/2014   HCT 39.7 09/08/2014   MCV 86.7 09/08/2014   PLT 278 09/08/2014      Chemistry      Component Value Date/Time   NA 140 09/08/2014 0918   NA 138 10/12/2013 0940   K 4.5 09/08/2014 0918   K 4.3 10/12/2013 0940   CL 102 10/12/2013 0940   CO2 25 09/08/2014 0918   CO2 27 10/12/2013 0940   BUN 25.9 09/08/2014 0918   BUN 18 10/12/2013 0940   CREATININE 1.0 09/08/2014 0918   CREATININE 0.91 10/12/2013 0940      Component Value Date/Time   CALCIUM 9.6 09/08/2014 0918   CALCIUM 9.7 10/12/2013 0940   ALKPHOS 77 09/08/2014 0918   AST 15 09/08/2014 0918   ALT 19 09/08/2014 0918   BILITOT 0.44 09/08/2014 0918      STUDIES:  Most recent echocardiogram on  06/09/2014  showed an ejection fraction of 60%-65%.    ASSESSMENT: 63 y.o. BRCA negative [place] woman   (1)  status post right breast biopsy 09/21/2013 for a clinical T1c N0, stage IA invasive ductal carcinoma, grade 2, triple positive, with a HER-2: CEP 17 ratio of 5.55 and an average copy number per cell of  13.05. The MIB-1-1 was 25%  (2) status post bilateral mastectomies 10/13/2013 showing:  (a) on  the left, atypical ductal hyperplasia  (b) on the right, a pT1c pN0, stage IA invasive ductal carcinoma, grade 2  (  3) received paclitaxel/trastuzumab, with paclitaxel given on days 1, 8, and 15 and trastuzumab given every 2 weeks on days 1 and 15 of each 28 day cycle, for 4 cycles (12 doses of paclitaxel) completed 03/24/2014  (4) continuing trastuzumab alone every three weeks to complete a year (through mid-January 2016); echocardiogram July 2015 shows a well preserved ejection fraction   (5 The patient participated in the Star study remotely, completing 5 years of tamoxifen in the late 1990s  (6 start anastrozole Today 06/16/2014.  (7) definitive implant placement done June 2015 with Dr Harlow Mares.  PLAN:  1. Today we discussed antiestrogen therapy, and she will start anastrozole 1 mg daily. She has a good understanding of the possible toxicities, side effects and complications of this agent. She also understands the benefits. Bone density to be checked q 2 years and lipid profile q 6 months.I gave her written notes on Arimidex and it's side effects.  2. She is now taking weekly high dose of Vitamin D and will be taking Caltrate twice a day.  3. Her labs physical exam were normal today and she will get herceptin today. We will continue cardiac monitoring. I will order a 2-D echo with contrast for later this month.  4. RTC as scheduled on 10/12/2014 with Dr. Jana Hakim. She should have at her followup echocardiogram prior to this visit with Dr. Jana Hakim.   Chassity has a good understanding of the overall plan. She agrees with it. She knows the goal of treatment in her case is cure. She will call with any problems that may develop before her next visit here.    Carlton Adam, PA-C   09/09/2014 11:23 PM

## 2014-09-11 ENCOUNTER — Other Ambulatory Visit: Payer: Self-pay | Admitting: *Deleted

## 2014-09-11 DIAGNOSIS — C50411 Malignant neoplasm of upper-outer quadrant of right female breast: Secondary | ICD-10-CM

## 2014-09-11 MED ORDER — ANASTROZOLE 1 MG PO TABS: 1.0000 mg | ORAL_TABLET | Freq: Every day | ORAL | Status: DC

## 2014-09-12 ENCOUNTER — Telehealth: Payer: Self-pay | Admitting: Oncology

## 2014-09-12 NOTE — Telephone Encounter (Signed)
Confirm appt d/t for  ECHO Vaughan Basta -[id number]) on 09/15/14 @ 11am. Also confirm the appt for Nov. 2016.

## 2014-09-15 ENCOUNTER — Ambulatory Visit (HOSPITAL_COMMUNITY): Payer: 59 | Attending: Physician Assistant

## 2014-09-25 ENCOUNTER — Encounter (INDEPENDENT_AMBULATORY_CARE_PROVIDER_SITE_OTHER): Payer: Self-pay | Admitting: General Surgery

## 2014-09-29 ENCOUNTER — Telehealth: Payer: Self-pay | Admitting: Oncology

## 2014-09-29 ENCOUNTER — Encounter: Payer: Self-pay | Admitting: Oncology

## 2014-09-29 ENCOUNTER — Ambulatory Visit: Payer: 59

## 2014-09-29 VITALS — BP 132/71 | HR 70 | Temp 99.5°F | Resp 18

## 2014-09-29 DIAGNOSIS — C50411 Malignant neoplasm of upper-outer quadrant of right female breast: Secondary | ICD-10-CM

## 2014-09-29 NOTE — Telephone Encounter (Signed)
, °

## 2014-09-29 NOTE — Progress Notes (Signed)
Per assessment pt c/o being "forgetful" and "unable to remember things". Pt's speech is slow and pt struggles with expressing words and thoughts. Pt/CG deny any other symptoms such as ataxia, dysphagia or headaches. Per Dr.Magrinat: Hold Herceptin and schedule Echocardiogram. Schedule MRI of brain W/WO contrast. Pt/CG aware and verbalize understanding.

## 2014-09-29 NOTE — Progress Notes (Signed)
I called [employer] on these DOS and spoke with [place], ref#[id number].  The DOS 03/03/14 is being denied for invalid principle diagnosis and will be sent for a code review once we receive medical records from [facility].   The DOS 05/25/14 is being denied as a duplicate in error and has been sent back for reprocessing and DOS 07/07/14 was paid on 09/26/14 with no pt resp, need to allow time to receive payment from [facility]. Note from billing

## 2014-10-02 ENCOUNTER — Ambulatory Visit (HOSPITAL_COMMUNITY)
Admission: RE | Admit: 2014-10-02 | Discharge: 2014-10-02 | Disposition: A | Discharge status: Home / Self Care | Payer: 59 | Source: Ambulatory Visit | Attending: Physician Assistant | Admitting: Physician Assistant

## 2014-10-02 DIAGNOSIS — Z01810 Encounter for preprocedural cardiovascular examination: Secondary | ICD-10-CM | POA: Insufficient documentation

## 2014-10-02 DIAGNOSIS — C50411 Malignant neoplasm of upper-outer quadrant of right female breast: Secondary | ICD-10-CM | POA: Insufficient documentation

## 2014-10-02 DIAGNOSIS — I369 Nonrheumatic tricuspid valve disorder, unspecified: Secondary | ICD-10-CM

## 2014-10-02 NOTE — Progress Notes (Signed)
  Echocardiogram 2D Echocardiogram has been performed.  Darlina Sicilian M 10/02/2014, 10:48 AM

## 2014-10-06 ENCOUNTER — Ambulatory Visit (HOSPITAL_COMMUNITY)
Admission: RE | Admit: 2014-10-06 | Discharge: 2014-10-06 | Disposition: A | Discharge status: Home / Self Care | Payer: 59 | Source: Ambulatory Visit | Attending: Oncology | Admitting: Oncology

## 2014-10-06 ENCOUNTER — Other Ambulatory Visit: Payer: Self-pay | Admitting: Oncology

## 2014-10-06 DIAGNOSIS — R413 Other amnesia: Secondary | ICD-10-CM | POA: Diagnosis present

## 2014-10-06 DIAGNOSIS — Z853 Personal history of malignant neoplasm of breast: Secondary | ICD-10-CM | POA: Diagnosis present

## 2014-10-06 DIAGNOSIS — C50411 Malignant neoplasm of upper-outer quadrant of right female breast: Secondary | ICD-10-CM

## 2014-10-06 DIAGNOSIS — I639 Cerebral infarction, unspecified: Secondary | ICD-10-CM | POA: Diagnosis not present

## 2014-10-06 DIAGNOSIS — R479 Unspecified speech disturbances: Secondary | ICD-10-CM | POA: Diagnosis present

## 2014-10-06 DIAGNOSIS — G458 Other transient cerebral ischemic attacks and related syndromes: Secondary | ICD-10-CM | POA: Diagnosis not present

## 2014-10-06 MED ORDER — GADOBENATE DIMEGLUMINE 529 MG/ML IV SOLN
15.0000 mL | Freq: Once | INTRAVENOUS | Status: AC | PRN
  Administered 2014-10-06: 15 mL via INTRAVENOUS

## 2014-10-11 ENCOUNTER — Other Ambulatory Visit: Payer: Self-pay | Admitting: *Deleted

## 2014-10-11 DIAGNOSIS — C50411 Malignant neoplasm of upper-outer quadrant of right female breast: Secondary | ICD-10-CM

## 2014-10-12 ENCOUNTER — Other Ambulatory Visit (HOSPITAL_BASED_OUTPATIENT_CLINIC_OR_DEPARTMENT_OTHER): Payer: 59

## 2014-10-12 ENCOUNTER — Telehealth: Payer: Self-pay | Admitting: *Deleted

## 2014-10-12 ENCOUNTER — Telehealth: Payer: Self-pay | Admitting: Oncology

## 2014-10-12 ENCOUNTER — Ambulatory Visit (HOSPITAL_BASED_OUTPATIENT_CLINIC_OR_DEPARTMENT_OTHER): Payer: 59 | Admitting: Oncology

## 2014-10-12 VITALS — BP 127/75 | HR 65 | Temp 98.0°F | Resp 18 | Ht 66.0 in | Wt 159.4 lb

## 2014-10-12 DIAGNOSIS — C50411 Malignant neoplasm of upper-outer quadrant of right female breast: Secondary | ICD-10-CM

## 2014-10-12 DIAGNOSIS — Z170 Estrogen receptor positive status [ER+]: Secondary | ICD-10-CM

## 2014-10-12 LAB — COMPREHENSIVE METABOLIC PANEL (CC13)
ALBUMIN: 3.8 g/dL (ref 3.5–5.0)
ALK PHOS: 82 U/L (ref 40–150)
ALT: 16 U/L (ref 0–55)
ANION GAP: 6 meq/L (ref 3–11)
AST: 16 U/L (ref 5–34)
BUN: 21 mg/dL (ref 7.0–26.0)
CO2: 28 meq/L (ref 22–29)
Calcium: 9.5 mg/dL (ref 8.4–10.4)
Chloride: 104 mEq/L (ref 98–109)
Creatinine: 0.9 mg/dL (ref 0.6–1.1)
GLUCOSE: 90 mg/dL (ref 70–140)
Potassium: 4.8 mEq/L (ref 3.5–5.1)
Sodium: 138 mEq/L (ref 136–145)
TOTAL PROTEIN: 7 g/dL (ref 6.4–8.3)
Total Bilirubin: 0.35 mg/dL (ref 0.20–1.20)

## 2014-10-12 LAB — CBC WITH DIFFERENTIAL/PLATELET
BASO%: 0.6 % (ref 0.0–2.0)
Basophils Absolute: 0 10*3/uL (ref 0.0–0.1)
EOS%: 3.5 % (ref 0.0–7.0)
Eosinophils Absolute: 0.2 10*3/uL (ref 0.0–0.5)
HCT: 38.2 % (ref 34.8–46.6)
HGB: 12.9 g/dL (ref 11.6–15.9)
LYMPH%: 27.9 % (ref 14.0–49.7)
MCH: 30.1 pg (ref 25.1–34.0)
MCHC: 33.8 g/dL (ref 31.5–36.0)
MCV: 89.3 fL (ref 79.5–101.0)
MONO#: 0.6 10*3/uL (ref 0.1–0.9)
MONO%: 8.1 % (ref 0.0–14.0)
NEUT#: 4.1 10*3/uL (ref 1.5–6.5)
NEUT%: 59.9 % (ref 38.4–76.8)
Platelets: 233 10*3/uL (ref 145–400)
RBC: 4.28 10*6/uL (ref 3.70–5.45)
RDW: 13.6 % (ref 11.2–14.5)
WBC: 6.8 10*3/uL (ref 3.9–10.3)
lymph#: 1.9 10*3/uL (ref 0.9–3.3)

## 2014-10-12 NOTE — Telephone Encounter (Signed)
, °

## 2014-10-12 NOTE — Telephone Encounter (Signed)
Per staff message and POF I have scheduled appts. Advised scheduler of appts. JMW  

## 2014-10-12 NOTE — Addendum Note (Signed)
Addended by: Laureen Abrahams on: 10/12/2014 05:05 PM   Modules accepted: Medications

## 2014-10-12 NOTE — Progress Notes (Signed)
ID: Barbara [name] OB: 1951/03/29  MR#: [id number]  QBH#:[id number]  PCP: [name], MD GYN:  Felipa Emory SU: [facility] OTHER OX:BDZHGD [facility]  CHIEF COMPLAINT: Right Breast Cancer  CURRENT TREATMENT: Anastrozole; trastuzumab  BREAST CANCER HISTORY: From the original intake note:  The patient has a very strong family history for breast cancer, although she has tested  negative for the BRCA mutations (as have 2 of her sisters). She had been receiving breast MRI in addition to mammography, but had not had this test performed since October of 2010. More recently, 07/22/2013, routine screening mammography with tomography at [facility] showed no worrisome findings in the setting of extremely dense breasts.  MRI of the breast 09/08/2013, however, showed in the right upper outer quadrant an irregular mass measuring 1.7 cm. This was new as compared to the prior MRI from 2010. There were no abnormal lymph nodes and no other areas of concern in either breast. 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 The MIB-1 was 25%.  The patient's subsequent history is as detailed below  INTERVAL HISTORY: Barbara [name] returns today for followup of her right breast cancer accompanied by her husband Ed. she was here on November 6 to receive Herceptin, but she had missed her echocardiogram in late October and in addition she was very tearful that day. She had run out of her Paxil and missed a couple of days. Even though she had her already resumed that medication, she was very emotional. She also complained of having difficulty word finding. Accordingly we omitted the Herceptin that day and set her up for a brain MRI. 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 There was no evidence of metastatic disease. Barbara [name] also had her echocardiogram 10/02/2014. It showed an ejection fraction of 65% and was essentially a normal study  REVIEW OF SYSTEMS: Barbara [name] is not aware of any neurologic symptoms other than the occasional problem with word finding. Her son recently put her through a Mini-Mental test and asked her to name 15 different animals within 60 seconds. 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 There have been no headaches, visual changes, dizziness, gait imbalance, nausea, or any other neurologic symptoms. A detailed review of systems today was otherwise entirely negative.  PAST MEDICAL HISTORY: Past Medical History  Diagnosis Date  . Breast cancer   . Hashimoto's thyroiditis     with goiter  . Hypercholesteremia   . 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  Narrowing of the 4th and 5th lumbar interspaces.   Marland Kitchen Spina bifida     occutta L5  . Anxiety   . GERD (gastroesophageal reflux disease)   . Headache(784.0)     hx migraines  . Varicose vein     PAST SURGICAL HISTORY: Past Surgical History  Procedure Laterality Date  . Hernia repair    . Tubal ligation    . Cystoscopy with urethral caruncle    . Mastectomy w/ sentinel node biopsy Bilateral 10/13/2013    Procedure:  BILATERAL MASTECTOMY WITH RIGHT SENTINEL  LYMPH NODE BIOPSY;  Surgeon: Merrie Roof, MD;  Location: [facility];  Service: General;  Laterality: Bilateral;  . Portacath placement Left 10/13/2013    Procedure: INSERTION PORT-A-CATH;  Surgeon: Merrie Roof, MD;  Location: [facility];  Service: General;  Laterality: Left;  . Breast reconstruction with placement of tissue expander and flex hd (acellular hydrated dermis) Bilateral 10/13/2013    Procedure: PLACEMENT OF BILATERAL  TISSUE EXPANDER FOR BREAST RECONSTRUCTION ;  Surgeon: Crissie Reese, MD;  Location: [facility];  Service: Plastics;  Laterality: Bilateral;    FAMILY HISTORY Family History  Problem Relation Age of Onset  . Breast cancer Mother 68  . Lung cancer Father   . Breast cancer Sister 62    Bilateral breast cancer with 2nd dx at 46  . Breast cancer Maternal Aunt 52  . Breast cancer Sister 37  . Lung cancer Maternal Uncle   . Leukemia Paternal Uncle   . Melanoma Brother 52    on ear  . Prostate cancer Brother 71  . Cancer Cousin     maternal cousin with cancer - NOS  The patient's mother was diagnosed with breast cancer the age of 23. She died at the age of 34. The patient's father died at the age of 41, with a history of lung cancer. The patient had 2 brothers, and 2 sisters. The patient's sister [name] has a history of bilateral breast cancer in her sister [name] a history of left breast cancer. Both have been tested and are found to be negative for a BRCA one or 2 mutation. The patient's mother's sister also had a history of breast cancer. The patient herself has been tested for the BRCA mutation as well as through [facility]. No mutation has been file.   GYNECOLOGIC HISTORY:  Menarche age 13, first live birth age 77. The patient is GX P2. She stopped having periods in her early 11s. She did not take hormone replacement.  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 Her husband Ed used to work for [employer]. Daughter [name] works as a Music therapist in [place]. Son [name] is a drug representative for [employer] (diabetes). His wife, and December, is a Ship broker in [place] emergency room's. The patient has 5 grandchildren. She at tends [place]    ADVANCED DIRECTIVES:  In place   HEALTH MAINTENANCE:  (Updated 02/10/2014) History  Substance Use Topics  . Smoking status: Never  Smoker   . Smokeless tobacco: Never Used  . Alcohol Use: Yes     Comment: rarely     Colonoscopy: 2013/ [facility]  PAP: 2013/Dr. Sabra Heck  Bone density: 2013/osteopenia  Lipid panel: Not on file/Dr. Laurann Montana   Allergies  Allergen Reactions  . Buprenex [Buprenorphine] Nausea And Vomiting and Other (See Comments)    Oversedation  . Simvastatin Other (See Comments)    Myalgias   . Codeine Hives, Itching and Rash    Current Outpatient Prescriptions  Medication Sig Dispense Refill  . anastrozole (ARIMIDEX) 1 MG tablet Take 1 tablet (1 mg total) by mouth daily. 90 tablet 3  . aspirin 81 MG tablet Take 81 mg by mouth daily.    Marland Kitchen atorvastatin (LIPITOR) 10 MG tablet Take 10 mg by mouth at bedtime.     . Calcium-Vitamin D-Vitamin K (VIACTIV) 154-008-67 MG-UNT-MCG CHEW Chew 1 tablet by mouth.     . ergocalciferol (VITAMIN D2) 50000 UNITS  capsule Take 50,000 Units by mouth once a week.    . fluticasone (FLONASE) 50 MCG/ACT nasal spray Place 2 sprays into both nostrils daily.    Marland Kitchen levothyroxine (SYNTHROID, LEVOTHROID) 50 MCG tablet Take 50 mcg by mouth daily before breakfast. Take 50 mcg 1 hours before breakfast.    . lidocaine-prilocaine (EMLA) cream Apply 1 application topically as needed. 30 g 0  . Nutritional Supplements (JUICE PLUS FIBRE) LIQD Take by mouth.    Marland Kitchen PARoxetine (PAXIL) 20 MG tablet Take 20 mg by mouth at bedtime.      No current facility-administered medications for this visit.    OBJECTIVE:  Middle-aged white woman in no acute distress Filed Vitals:   10/12/14 1127  BP: 127/75  Pulse: 65  Temp: 98 F (36.7 C)  Resp: 18  Body mass index is 25.74 kg/(m^2).   ECOG:  1 Filed Weights   10/12/14 1127  Weight: 159 lb 6.4 oz (72.303 kg)   Sclerae unicteric, pupils equal, round and reactive; EOMs intact; no nystagmus Oropharynx clear; teeth in good repair No cervical or supraclavicular adenopathy Lungs no rales or rhonchi Heart regular rate and rhythm, no murmur  appreciated Abd soft, nontender, positive bowel sounds MSK no focal spinal tenderness, no upper extremity lymphedema Neuro: Entirely nonfocal. Motor was 5 over 5 all groups tested. Patient walked with normal gait, and was able to stand on one leg without difficulty. She was well oriented. There was occasional mild word finding difficulty. Affect was positive. Breasts: Status post bilateral mastectomies with saline implant in place. The cosmetic result is excellent. There is no evidence of local recurrence. Both axillae are benign.   LAB RESULTS:   Lab Results  Component Value Date   WBC 6.8 10/12/2014   NEUTROABS 4.1 10/12/2014   HGB 12.9 10/12/2014   HCT 38.2 10/12/2014   MCV 89.3 10/12/2014   PLT 233 10/12/2014      Chemistry      Component Value Date/Time   NA 140 09/08/2014 0918   NA 138 10/12/2013 0940   K 4.5 09/08/2014 0918   K 4.3 10/12/2013 0940   CL 102 10/12/2013 0940   CO2 25 09/08/2014 0918   CO2 27 10/12/2013 0940   BUN 25.9 09/08/2014 0918   BUN 18 10/12/2013 0940   CREATININE 1.0 09/08/2014 0918   CREATININE 0.91 10/12/2013 0940      Component Value Date/Time   CALCIUM 9.6 09/08/2014 0918   CALCIUM 9.7 10/12/2013 0940   ALKPHOS 77 09/08/2014 0918   AST 15 09/08/2014 0918   ALT 19 09/08/2014 0918   BILITOT 0.44 09/08/2014 0918      STUDIES:  Most recent echocardiogram on  03/03/2014  showed an ejection fraction of 55-60%.    ASSESSMENT: 63 y.o. BRCA negative [place] woman   (1)  status post right breast biopsy 09/21/2013 for a clinical T1c N0, stage IA invasive ductal carcinoma, grade 2, triple positive, with a HER-2: CEP 17 ratio of 5.55 and an average copy number per cell of  13.05. The MIB-1-1 was 25%  (2) status post bilateral mastectomies 10/13/2013 showing:  (a) on the left, atypical ductal hyperplasia  (b) on the right, a pT1c pN0, stage IA invasive ductal carcinoma, grade 2  (c) completed bilateral saline implant reconstruction  07/28/2014  (3) received paclitaxel/trastuzumab, with paclitaxel given on days 1, 8, and 15 and trastuzumab given every 2 weeks on days 1 and 15 of each 28 day cycle, for 4 cycles (12 doses  of paclitaxel) completed 03/24/2014  (4) continuing trastuzumab alone every three weeks to complete a year (through mid-January 2016); echocardiogram 10/02/2014 shows a well preserved ejection fraction -- last dose scheduled for first week in January  (5) The patient participated in the [facility] study remotely, completing 5 years of tamoxifen in the late 1990s  (6) started anastrozole May 2015  (a) bone density 06/09/2014 showed a T score of -1.6 (decreased from prior)  (7) definitive implant placement planned for June 2015  PLAN: Barbara [name]'s brain MRI shows a very small left cerebellar acute or subacute infarct. However her exam is entirely normal except for mild word finding problems, which are not new. She is already on a "baby aspirin" daily. I am referring her to neurology for further evaluation and treatment.   We reviewed the fact that when anastrozole as compared to placebo there is no evidence of increased stroke, heart attacks, or other hypercoagulable problems.  She has 3 more Herceptin treatments to go. I will see her early January, with the final treatment, and at that point we will discuss bisphosphonates.  Barbara [name] is a good understanding of the overall plan. She agrees with it. She knows the goal of treatment in her case is cure. She will call with any problems that may develop before her next visit here.  Barbara Cruel, MD   10/12/2014 11:31 AM

## 2014-10-13 ENCOUNTER — Telehealth: Payer: Self-pay | Admitting: Oncology

## 2014-10-20 ENCOUNTER — Ambulatory Visit (HOSPITAL_BASED_OUTPATIENT_CLINIC_OR_DEPARTMENT_OTHER): Payer: 59

## 2014-10-20 ENCOUNTER — Other Ambulatory Visit (HOSPITAL_BASED_OUTPATIENT_CLINIC_OR_DEPARTMENT_OTHER): Payer: 59

## 2014-10-20 ENCOUNTER — Other Ambulatory Visit: Payer: Self-pay | Admitting: Hematology

## 2014-10-20 DIAGNOSIS — C50411 Malignant neoplasm of upper-outer quadrant of right female breast: Secondary | ICD-10-CM

## 2014-10-20 DIAGNOSIS — Z5112 Encounter for antineoplastic immunotherapy: Secondary | ICD-10-CM

## 2014-10-20 LAB — CBC WITH DIFFERENTIAL/PLATELET
BASO%: 0.9 % (ref 0.0–2.0)
Basophils Absolute: 0.1 10*3/uL (ref 0.0–0.1)
EOS%: 3.8 % (ref 0.0–7.0)
Eosinophils Absolute: 0.2 10*3/uL (ref 0.0–0.5)
HCT: 38.9 % (ref 34.8–46.6)
HGB: 13 g/dL (ref 11.6–15.9)
LYMPH%: 34.8 % (ref 14.0–49.7)
MCH: 30.2 pg (ref 25.1–34.0)
MCHC: 33.4 g/dL (ref 31.5–36.0)
MCV: 90.5 fL (ref 79.5–101.0)
MONO#: 0.4 10*3/uL (ref 0.1–0.9)
MONO%: 6.9 % (ref 0.0–14.0)
NEUT%: 53.6 % (ref 38.4–76.8)
NEUTROS ABS: 3.1 10*3/uL (ref 1.5–6.5)
Platelets: 237 10*3/uL (ref 145–400)
RBC: 4.3 10*6/uL (ref 3.70–5.45)
RDW: 13.6 % (ref 11.2–14.5)
WBC: 5.8 10*3/uL (ref 3.9–10.3)
lymph#: 2 10*3/uL (ref 0.9–3.3)

## 2014-10-20 MED ORDER — DIPHENHYDRAMINE HCL 25 MG PO CAPS
ORAL_CAPSULE | ORAL | Status: AC
  Filled 2014-10-20: qty 1

## 2014-10-20 MED ORDER — ACETAMINOPHEN 325 MG PO TABS
ORAL_TABLET | ORAL | Status: AC
  Filled 2014-10-20: qty 2

## 2014-10-20 MED ORDER — SODIUM CHLORIDE 0.9 % IV SOLN
Freq: Once | INTRAVENOUS | Status: AC
  Administered 2014-10-20: 09:00:00 via INTRAVENOUS

## 2014-10-20 MED ORDER — ACETAMINOPHEN 325 MG PO TABS
650.0000 mg | ORAL_TABLET | Freq: Once | ORAL | Status: AC
  Administered 2014-10-20: 650 mg via ORAL

## 2014-10-20 MED ORDER — TRASTUZUMAB CHEMO INJECTION 440 MG
6.0000 mg/kg | Freq: Once | INTRAVENOUS | Status: AC
  Administered 2014-10-20: 441 mg via INTRAVENOUS
  Filled 2014-10-20: qty 21

## 2014-10-20 MED ORDER — HEPARIN SOD (PORK) LOCK FLUSH 100 UNIT/ML IV SOLN
500.0000 [IU] | Freq: Once | INTRAVENOUS | Status: AC | PRN
  Administered 2014-10-20: 500 [IU]
  Filled 2014-10-20: qty 5

## 2014-10-20 MED ORDER — SODIUM CHLORIDE 0.9 % IJ SOLN
10.0000 mL | INTRAMUSCULAR | Status: DC | PRN
  Administered 2014-10-20: 10 mL
  Filled 2014-10-20: qty 10

## 2014-10-20 MED ORDER — DIPHENHYDRAMINE HCL 25 MG PO CAPS
25.0000 mg | ORAL_CAPSULE | Freq: Once | ORAL | Status: AC
  Administered 2014-10-20: 25 mg via ORAL

## 2014-10-20 NOTE — Patient Instructions (Signed)
[facility] Discharge Instructions for Patients Receiving Chemotherapy  Today you received the following chemotherapy agents Herceptin.  To help prevent nausea and vomiting after your treatment, we encourage you to take your nausea medication as prescribed.   If you develop nausea and vomiting that is not controlled by your nausea medication, call the clinic.   BELOW ARE SYMPTOMS THAT SHOULD BE REPORTED IMMEDIATELY:  *FEVER GREATER THAN 100.5 F  *CHILLS WITH OR WITHOUT FEVER  NAUSEA AND VOMITING THAT IS NOT CONTROLLED WITH YOUR NAUSEA MEDICATION  *UNUSUAL SHORTNESS OF BREATH  *UNUSUAL BRUISING OR BLEEDING  TENDERNESS IN MOUTH AND THROAT WITH OR WITHOUT PRESENCE OF ULCERS  *URINARY PROBLEMS  *BOWEL PROBLEMS  UNUSUAL RASH Items with * indicate a potential emergency and should be followed up as soon as possible.  Feel free to call the clinic you have any questions or concerns. The clinic phone number is [phone].    

## 2014-10-24 ENCOUNTER — Encounter: Payer: Self-pay | Admitting: Diagnostic Neuroimaging

## 2014-10-24 ENCOUNTER — Ambulatory Visit (INDEPENDENT_AMBULATORY_CARE_PROVIDER_SITE_OTHER): Payer: 59 | Admitting: Diagnostic Neuroimaging

## 2014-10-24 VITALS — BP 141/75 | HR 74 | Temp 98.7°F | Ht 66.0 in | Wt 155.8 lb

## 2014-10-24 DIAGNOSIS — R413 Other amnesia: Secondary | ICD-10-CM

## 2014-10-24 DIAGNOSIS — I639 Cerebral infarction, unspecified: Secondary | ICD-10-CM

## 2014-10-24 DIAGNOSIS — R4789 Other speech disturbances: Secondary | ICD-10-CM
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
## 2014-11-09 ENCOUNTER — Other Ambulatory Visit: Payer: Self-pay | Admitting: *Deleted

## 2014-11-09 DIAGNOSIS — C50411 Malignant neoplasm of upper-outer quadrant of right female breast: Secondary | ICD-10-CM

## 2014-11-10 ENCOUNTER — Ambulatory Visit (HOSPITAL_BASED_OUTPATIENT_CLINIC_OR_DEPARTMENT_OTHER): Payer: 59

## 2014-11-10 ENCOUNTER — Other Ambulatory Visit (HOSPITAL_BASED_OUTPATIENT_CLINIC_OR_DEPARTMENT_OTHER): Payer: 59

## 2014-11-10 ENCOUNTER — Other Ambulatory Visit: Payer: Self-pay | Admitting: Oncology

## 2014-11-10 DIAGNOSIS — C50411 Malignant neoplasm of upper-outer quadrant of right female breast: Secondary | ICD-10-CM

## 2014-11-10 DIAGNOSIS — Z5112 Encounter for antineoplastic immunotherapy: Secondary | ICD-10-CM

## 2014-11-10 LAB — COMPREHENSIVE METABOLIC PANEL (CC13)
ALT: 15 U/L (ref 0–55)
AST: 14 U/L (ref 5–34)
Albumin: 3.8 g/dL (ref 3.5–5.0)
Alkaline Phosphatase: 77 U/L (ref 40–150)
Anion Gap: 8 mEq/L (ref 3–11)
BUN: 25.8 mg/dL (ref 7.0–26.0)
CO2: 28 meq/L (ref 22–29)
Calcium: 9.4 mg/dL (ref 8.4–10.4)
Chloride: 103 mEq/L (ref 98–109)
Creatinine: 1.1 mg/dL (ref 0.6–1.1)
EGFR: 56 mL/min/{1.73_m2} — AB (ref >90)
GLUCOSE: 92 mg/dL (ref 70–140)
Potassium: 4.6 mEq/L (ref 3.5–5.1)
Sodium: 139 mEq/L (ref 136–145)
TOTAL PROTEIN: 6.8 g/dL (ref 6.4–8.3)
Total Bilirubin: 0.39 mg/dL (ref 0.20–1.20)

## 2014-11-10 LAB — CBC WITH DIFFERENTIAL/PLATELET
BASO%: 0.5 % (ref 0.0–2.0)
Basophils Absolute: 0 10*3/uL (ref 0.0–0.1)
EOS ABS: 0.3 10*3/uL (ref 0.0–0.5)
EOS%: 4.4 % (ref 0.0–7.0)
HCT: 39 % (ref 34.8–46.6)
HGB: 12.9 g/dL (ref 11.6–15.9)
LYMPH#: 1.8 10*3/uL (ref 0.9–3.3)
LYMPH%: 32 % (ref 14.0–49.7)
MCH: 30.1 pg (ref 25.1–34.0)
MCHC: 33.1 g/dL (ref 31.5–36.0)
MCV: 90.9 fL (ref 79.5–101.0)
MONO#: 0.3 10*3/uL (ref 0.1–0.9)
MONO%: 6 % (ref 0.0–14.0)
NEUT%: 57.1 % (ref 38.4–76.8)
NEUTROS ABS: 3.3 10*3/uL (ref 1.5–6.5)
Platelets: 230 10*3/uL (ref 145–400)
RBC: 4.29 10*6/uL (ref 3.70–5.45)
RDW: 13.1 % (ref 11.2–14.5)
WBC: 5.7 10*3/uL (ref 3.9–10.3)

## 2014-11-10 MED ORDER — SODIUM CHLORIDE 0.9 % IV SOLN
6.0000 mg/kg | Freq: Once | INTRAVENOUS | Status: AC
  Administered 2014-11-10: 441 mg via INTRAVENOUS
  Filled 2014-11-10: qty 21

## 2014-11-10 MED ORDER — SODIUM CHLORIDE 0.9 % IV SOLN
Freq: Once | INTRAVENOUS | Status: AC
  Administered 2014-11-10: 10:00:00 via INTRAVENOUS

## 2014-11-10 MED ORDER — ACETAMINOPHEN 325 MG PO TABS
650.0000 mg | ORAL_TABLET | Freq: Once | ORAL | Status: AC
  Administered 2014-11-10: 650 mg via ORAL

## 2014-11-10 MED ORDER — HEPARIN SOD (PORK) LOCK FLUSH 100 UNIT/ML IV SOLN
500.0000 [IU] | Freq: Once | INTRAVENOUS | Status: AC | PRN
  Administered 2014-11-10: 500 [IU]
  Filled 2014-11-10: qty 5

## 2014-11-10 MED ORDER — ACETAMINOPHEN 325 MG PO TABS
ORAL_TABLET | ORAL | Status: AC
  Filled 2014-11-10: qty 2

## 2014-11-10 MED ORDER — DIPHENHYDRAMINE HCL 25 MG PO CAPS
ORAL_CAPSULE | ORAL | Status: AC
  Filled 2014-11-10: qty 1

## 2014-11-10 MED ORDER — SODIUM CHLORIDE 0.9 % IJ SOLN
10.0000 mL | INTRAMUSCULAR | Status: DC | PRN
  Administered 2014-11-10: 10 mL
  Filled 2014-11-10: qty 10

## 2014-11-10 MED ORDER — DIPHENHYDRAMINE HCL 25 MG PO CAPS
25.0000 mg | ORAL_CAPSULE | Freq: Once | ORAL | Status: AC
  Administered 2014-11-10: 25 mg via ORAL

## 2014-11-10 NOTE — Patient Instructions (Signed)
[facility] Discharge Instructions for Patients Receiving Chemotherapy  Today you received the following chemotherapy agents HERCEPTIN  To help prevent nausea and vomiting after your treatment, we encourage you to take your nausea medication IF NEEDED.   If you develop nausea and vomiting that is not controlled by your nausea medication, call the clinic.   BELOW ARE SYMPTOMS THAT SHOULD BE REPORTED IMMEDIATELY:  *FEVER GREATER THAN 100.5 F  *CHILLS WITH OR WITHOUT FEVER  NAUSEA AND VOMITING THAT IS NOT CONTROLLED WITH YOUR NAUSEA MEDICATION  *UNUSUAL SHORTNESS OF BREATH  *UNUSUAL BRUISING OR BLEEDING  TENDERNESS IN MOUTH AND THROAT WITH OR WITHOUT PRESENCE OF ULCERS  *URINARY PROBLEMS  *BOWEL PROBLEMS  UNUSUAL RASH Items with * indicate a potential emergency and should be followed up as soon as possible.  Feel free to call the clinic you have any questions or concerns. The clinic phone number is [phone].

## 2014-11-22 ENCOUNTER — Other Ambulatory Visit: Payer: Self-pay | Admitting: *Deleted

## 2014-11-26 ENCOUNTER — Other Ambulatory Visit: Payer: Self-pay | Admitting: Oncology

## 2014-11-30 ENCOUNTER — Other Ambulatory Visit: Payer: Self-pay | Admitting: Emergency Medicine

## 2014-11-30 DIAGNOSIS — C50411 Malignant neoplasm of upper-outer quadrant of right female breast: Secondary | ICD-10-CM

## 2014-12-01 ENCOUNTER — Other Ambulatory Visit (HOSPITAL_BASED_OUTPATIENT_CLINIC_OR_DEPARTMENT_OTHER): Payer: 59

## 2014-12-01 ENCOUNTER — Ambulatory Visit (HOSPITAL_BASED_OUTPATIENT_CLINIC_OR_DEPARTMENT_OTHER): Payer: 59 | Admitting: Oncology

## 2014-12-01 ENCOUNTER — Ambulatory Visit (HOSPITAL_BASED_OUTPATIENT_CLINIC_OR_DEPARTMENT_OTHER): Payer: 59

## 2014-12-01 ENCOUNTER — Telehealth: Payer: Self-pay | Admitting: Oncology

## 2014-12-01 VITALS — BP 129/73 | HR 75 | Temp 98.3°F | Resp 18 | Ht 66.0 in | Wt 154.3 lb

## 2014-12-01 DIAGNOSIS — M8580 Other specified disorders of bone density and structure, unspecified site: Secondary | ICD-10-CM

## 2014-12-01 DIAGNOSIS — Z5112 Encounter for antineoplastic immunotherapy: Secondary | ICD-10-CM

## 2014-12-01 DIAGNOSIS — C50411 Malignant neoplasm of upper-outer quadrant of right female breast: Secondary | ICD-10-CM

## 2014-12-01 DIAGNOSIS — Z170 Estrogen receptor positive status [ER+]: Secondary | ICD-10-CM

## 2014-12-01 DIAGNOSIS — I639 Cerebral infarction, unspecified: Secondary | ICD-10-CM

## 2014-12-01 LAB — COMPREHENSIVE METABOLIC PANEL (CC13)
ALT: 12 U/L (ref 0–55)
ANION GAP: 6 meq/L (ref 3–11)
AST: 15 U/L (ref 5–34)
Albumin: 4 g/dL (ref 3.5–5.0)
Alkaline Phosphatase: 81 U/L (ref 40–150)
BUN: 26.7 mg/dL — ABNORMAL HIGH (ref 7.0–26.0)
CO2: 28 meq/L (ref 22–29)
CREATININE: 1 mg/dL (ref 0.6–1.1)
Calcium: 9.4 mg/dL (ref 8.4–10.4)
Chloride: 103 mEq/L (ref 98–109)
EGFR: 58 mL/min/{1.73_m2} — ABNORMAL LOW (ref >90)
GLUCOSE: 67 mg/dL — AB (ref 70–140)
Potassium: 4.6 mEq/L (ref 3.5–5.1)
SODIUM: 137 meq/L (ref 136–145)
Total Bilirubin: 0.43 mg/dL (ref 0.20–1.20)
Total Protein: 7.1 g/dL (ref 6.4–8.3)

## 2014-12-01 LAB — CBC WITH DIFFERENTIAL/PLATELET
BASO%: 1.2 % (ref 0.0–2.0)
Basophils Absolute: 0.1 10*3/uL (ref 0.0–0.1)
EOS%: 3.4 % (ref 0.0–7.0)
Eosinophils Absolute: 0.2 10*3/uL (ref 0.0–0.5)
HEMATOCRIT: 40.4 % (ref 34.8–46.6)
HGB: 13.3 g/dL (ref 11.6–15.9)
LYMPH#: 2.1 10*3/uL (ref 0.9–3.3)
LYMPH%: 34.6 % (ref 14.0–49.7)
MCH: 29.9 pg (ref 25.1–34.0)
MCHC: 32.9 g/dL (ref 31.5–36.0)
MCV: 90.8 fL (ref 79.5–101.0)
MONO#: 0.4 10*3/uL (ref 0.1–0.9)
MONO%: 6.7 % (ref 0.0–14.0)
NEUT#: 3.3 10*3/uL (ref 1.5–6.5)
NEUT%: 54.1 % (ref 38.4–76.8)
PLATELETS: 279 10*3/uL (ref 145–400)
RBC: 4.44 10*6/uL (ref 3.70–5.45)
RDW: 13.1 % (ref 11.2–14.5)
WBC: 6.1 10*3/uL (ref 3.9–10.3)

## 2014-12-01 MED ORDER — DIPHENHYDRAMINE HCL 25 MG PO CAPS
25.0000 mg | ORAL_CAPSULE | Freq: Once | ORAL | Status: AC
  Administered 2014-12-01: 25 mg via ORAL

## 2014-12-01 MED ORDER — SODIUM CHLORIDE 0.9 % IV SOLN
Freq: Once | INTRAVENOUS | Status: AC
  Administered 2014-12-01: 13:00:00 via INTRAVENOUS

## 2014-12-01 MED ORDER — ACETAMINOPHEN 325 MG PO TABS
650.0000 mg | ORAL_TABLET | Freq: Once | ORAL | Status: AC
  Administered 2014-12-01: 650 mg via ORAL

## 2014-12-01 MED ORDER — HEPARIN SOD (PORK) LOCK FLUSH 100 UNIT/ML IV SOLN
500.0000 [IU] | Freq: Once | INTRAVENOUS | Status: AC | PRN
  Administered 2014-12-01: 500 [IU]
  Filled 2014-12-01: qty 5

## 2014-12-01 MED ORDER — ACETAMINOPHEN 325 MG PO TABS
ORAL_TABLET | ORAL | Status: AC
Start: 2014-12-01 — End: 2014-12-01
  Filled 2014-12-01: qty 2

## 2014-12-01 MED ORDER — DIPHENHYDRAMINE HCL 25 MG PO CAPS
ORAL_CAPSULE | ORAL | Status: AC
Start: 2014-12-01 — End: 2014-12-01
  Filled 2014-12-01: qty 1

## 2014-12-01 MED ORDER — SODIUM CHLORIDE 0.9 % IJ SOLN
10.0000 mL | INTRAMUSCULAR | Status: DC | PRN
  Administered 2014-12-01: 10 mL
  Filled 2014-12-01: qty 10

## 2014-12-01 MED ORDER — TRASTUZUMAB CHEMO INJECTION 440 MG
6.0000 mg/kg | Freq: Once | INTRAVENOUS | Status: AC
  Administered 2014-12-01: 441 mg via INTRAVENOUS
  Filled 2014-12-01: qty 21

## 2014-12-01 NOTE — Patient Instructions (Signed)
[facility] Discharge Instructions for Patients Receiving Chemotherapy  Today you received the following chemotherapy agents Herceptin.  To help prevent nausea and vomiting after your treatment, we encourage you to take your nausea medication as directed.   If you develop nausea and vomiting that is not controlled by your nausea medication, call the clinic.   BELOW ARE SYMPTOMS THAT SHOULD BE REPORTED IMMEDIATELY:  *FEVER GREATER THAN 100.5 F  *CHILLS WITH OR WITHOUT FEVER  NAUSEA AND VOMITING THAT IS NOT CONTROLLED WITH YOUR NAUSEA MEDICATION  *UNUSUAL SHORTNESS OF BREATH  *UNUSUAL BRUISING OR BLEEDING  TENDERNESS IN MOUTH AND THROAT WITH OR WITHOUT PRESENCE OF ULCERS  *URINARY PROBLEMS  *BOWEL PROBLEMS  UNUSUAL RASH Items with * indicate a potential emergency and should be followed up as soon as possible.  Feel free to call the clinic you have any questions or concerns. The clinic phone number is [phone].  

## 2014-12-01 NOTE — Progress Notes (Signed)
ID: Barbara [name] OB: 1951/03/07  MR#: [id number]  CZY#:[id number]  PCP: [name], MD GYN:  Felipa Emory SU: Star Age OTHER UX:NATFTD Blair Heys  CHIEF COMPLAINT: Right Breast Cancer  CURRENT TREATMENT: Anastrozole; trastuzumab  BREAST CANCER HISTORY: From the original intake note:  The patient has a very strong family history for breast cancer, although she has tested  negative for the BRCA mutations (as have 2 of her sisters). She had been receiving breast MRI in addition to mammography, but had not had this test performed since October of 2010. More recently, 07/22/2013, routine screening mammography with tomography at [facility] showed no worrisome findings in the setting of extremely dense breasts.  MRI of the breast 09/08/2013, however, showed in the right upper outer quadrant an irregular mass measuring 1.7 cm. This was new as compared to the prior MRI from 2010. There were no abnormal lymph nodes and no other areas of concern in either breast. 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 The MIB-1 was 25%.  The patient's subsequent history is as detailed below  INTERVAL HISTORY: Barbara [name] returns today for followup of her right breast cancer accompanied by her husband Ed. the interval history is unremarkable. She continues on anastrozole, with some vaginal dryness is the only side effect. Of course this problem proceeded the anastrozole. She completes her trastuzumab today and she denies any symptoms suggestive of congestive heart failure. She would like her port removed "as soon as possible"  REVIEW OF SYSTEMS: Barbara [name] has some "aches and pains here and there ", but nothing more intense or persistent than usual. She has recovered nicely from her stroke, denies balance  problems or falls, denies vision problems, unusual headaches, nausea or vomiting. Sometimes she has trouble finding the right word. A detailed review of systems today was otherwise stable  PAST MEDICAL HISTORY: Past Medical History  Diagnosis Date  . Breast cancer   . Hashimoto's thyroiditis     with goiter  . Hypercholesteremia   . 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  Narrowing of the 4th and 5th lumbar interspaces.   Marland Kitchen Spina bifida     occutta L5  . Anxiety   . GERD (gastroesophageal reflux disease)   . Headache(784.0)     hx migraines  . Varicose vein     PAST SURGICAL HISTORY: Past Surgical History  Procedure Laterality Date  . Hernia repair  1976    Left groin  . Tubal ligation    . Cystoscopy with urethral caruncle    . Mastectomy w/ sentinel node biopsy Bilateral 10/13/2013    Procedure:  BILATERAL MASTECTOMY WITH RIGHT SENTINEL LYMPH NODE BIOPSY;  Surgeon: Merrie Roof, MD;  Location: [facility];  Service: General;  Laterality: Bilateral;  . Portacath placement Left 10/13/2013    Procedure: INSERTION PORT-A-CATH;  Surgeon: Merrie Roof, MD;  Location: [facility];  Service: General;  Laterality: Left;  . Breast reconstruction with placement of tissue expander and flex hd (acellular hydrated dermis) Bilateral 10/13/2013    Procedure: PLACEMENT OF BILATERAL TISSUE EXPANDER FOR BREAST RECONSTRUCTION ;  Surgeon: Crissie Reese, MD;  Location: [facility];  Service: Plastics;  Laterality: Bilateral;  . Wisdom tooth extraction  1990    FAMILY HISTORY  Family History  Problem Relation Age of Onset  . Breast cancer Mother 90  . Lung cancer Father   . Breast cancer Sister 59    Bilateral breast cancer with 2nd dx at 45  . Breast cancer Maternal Aunt 52  . Breast cancer Sister 27  . Lung cancer Maternal Uncle   . Leukemia Paternal Uncle   . Melanoma Brother 52    on ear  . Prostate cancer Brother 33  . Cancer Cousin     maternal  cousin with cancer - NOS  The patient's mother was diagnosed with breast cancer the age of 64. She died at the age of 12. The patient's father died at the age of 52, with a history of lung cancer. The patient had 2 brothers, and 2 sisters. The patient's sister [name] has a history of bilateral breast cancer in her sister [name] a history of left breast cancer. Both have been tested and are found to be negative for a BRCA one or 2 mutation. The patient's mother's sister also had a history of breast cancer. The patient herself has been tested for the BRCA mutation as well as through [facility]. No mutation has been file.   GYNECOLOGIC HISTORY:  Menarche age 59, first live birth age 60. The patient is GX P2. She stopped having periods in her early 44s. She did not take hormone replacement.  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 Her husband Ed used to work for [employer]. Daughter [name] works as a Music therapist in [place]. Son [name] is a drug representative for [employer] (diabetes). His wife, and December, is a Ship broker in [place] emergency room's. The patient has 5 grandchildren. She at tends [place]    ADVANCED DIRECTIVES:  In place   HEALTH MAINTENANCE:  (Updated 02/10/2014) History  Substance Use Topics  . Smoking status: Never Smoker   . Smokeless tobacco: Never Used  . Alcohol Use: 0.0 oz/week    0 Not specified per week     Comment: rarely 3-4 drinks yearly     Colonoscopy: 2013/ [place]  PAP: 2013/Dr. Sabra Heck  Bone density: 2013/osteopenia  Lipid panel: Not on file/Dr. Laurann Montana   Allergies  Allergen Reactions  . Buprenex [Buprenorphine] Nausea And Vomiting and Other (See Comments)    Oversedation  . Simvastatin Other (See Comments)    Myalgias   . Codeine Hives, Itching and Rash    Current Outpatient Prescriptions  Medication Sig Dispense Refill   . anastrozole (ARIMIDEX) 1 MG tablet Take 1 tablet (1 mg total) by mouth daily. 90 tablet 3  . aspirin 81 MG tablet Take 81 mg by mouth daily.    Marland Kitchen atorvastatin (LIPITOR) 10 MG tablet Take 10 mg by mouth at bedtime.     Marland Kitchen BIOTIN PO Take by mouth.    . Calcium-Vitamin D-Vitamin K (VIACTIV) 196-222-97 MG-UNT-MCG CHEW Chew 1 tablet by mouth.     . ergocalciferol (VITAMIN D2) 50000 UNITS capsule Take 50,000 Units by mouth once a week.    . fluticasone (FLONASE) 50 MCG/ACT nasal spray Place 2 sprays into both nostrils daily as needed.     Marland Kitchen levothyroxine (SYNTHROID, LEVOTHROID) 50 MCG tablet Take 50 mcg by mouth daily before breakfast. Take 50 mcg 1 hours before breakfast.    . lidocaine-prilocaine (EMLA) cream Apply 1 application topically as needed. 30 g 0  . Nutritional Supplements (JUICE PLUS FIBRE) LIQD  Take by mouth.    Marland Kitchen PARoxetine (PAXIL) 20 MG tablet Take 20 mg by mouth at bedtime.      No current facility-administered medications for this visit.    OBJECTIVE:  Middle-aged white woman who appears stated age 64 Vitals:   12/01/14 1045  BP: 129/73  Pulse: 75  Temp: 98.3 F (36.8 C)  Resp: 18  Body mass index is 24.92 kg/(m^2).   ECOG:  1 Filed Weights   12/01/14 1045  Weight: 154 lb 4.8 oz (69.99 kg)   Sclerae unicteric, pupils round and equal Oropharynx clear; she tells me a tooth in her left maxilla may need to be removed No cervical or supraclavicular adenopathy Lungs no rales or rhonchi Heart regular rate and rhythm, no murmur appreciated Abd soft, nontender, positive bowel sounds MSK no focal spinal tenderness, no upper extremity lymphedema Neuro: Nonfocal; well-oriented; minimal word finding issues, very clear and grow medical speech; Breasts: Status post bilateral mastectomies with saline implants in place. There is no evidence of local recurrence. Both axillae are benign.   LAB RESULTS:   Lab Results  Component Value Date   WBC 6.1 12/01/2014   NEUTROABS  3.3 12/01/2014   HGB 13.3 12/01/2014   HCT 40.4 12/01/2014   MCV 90.8 12/01/2014   PLT 279 12/01/2014      Chemistry      Component Value Date/Time   NA 137 12/01/2014 1005   NA 138 10/12/2013 0940   K 4.6 12/01/2014 1005   K 4.3 10/12/2013 0940   CL 102 10/12/2013 0940   CO2 28 12/01/2014 1005   CO2 27 10/12/2013 0940   BUN 26.7* 12/01/2014 1005   BUN 18 10/12/2013 0940   CREATININE 1.0 12/01/2014 1005   CREATININE 0.91 10/12/2013 0940      Component Value Date/Time   CALCIUM 9.4 12/01/2014 1005   CALCIUM 9.7 10/12/2013 0940   ALKPHOS 81 12/01/2014 1005   AST 15 12/01/2014 1005   ALT 12 12/01/2014 1005   BILITOT 0.43 12/01/2014 1005      STUDIES:  No results found.   ASSESSMENT: 64 y.o. BRCA negative [place] woman   (1)  status post right breast biopsy 09/21/2013 for a clinical T1c N0, stage IA invasive ductal carcinoma, grade 2, triple positive, with a HER-2: CEP 17 ratio of 5.55 and an average copy number per cell of  13.05. The MIB-1-1 was 25%  (2) status post bilateral mastectomies 10/13/2013 showing:  (a) on the left, atypical ductal hyperplasia  (b) on the right, a pT1c pN0, stage IA invasive ductal carcinoma, grade 2  (c) completed bilateral saline implant reconstruction 07/28/2014  (3) received paclitaxel/trastuzumab, with paclitaxel given on days 1, 8, and 15 and trastuzumab given every 2 weeks on days 1 and 15 of each 28 day cycle, for 4 cycles (12 doses of paclitaxel) completed 03/24/2014  (4) continued trastuzumab every three weeks through 12/01/2014   (a) echocardiogram 10/02/2014 showed a well preserved ejection fraction  (5) The patient participated in the Star study remotely, completing 5 years of tamoxifen in the late 1990s  (6) started anastrozole May 2015  (a) bone density 06/09/2014 showed a T score of -1.6 (decreased from prior)   PLAN: Benedicta completes her year of trastuzumab treatments today. Overall she tolerated this well, and as of  her last echo November 2015 maintain an excellent ejection fraction. She is now ready to have her port removed.  We discussed her bone density and considered zolendronate, denosumab, or other  agents. She has a good understanding of the possible toxicity, side effects, and complications of these agents. However she tells me that she may need to have a tooth pulled from her left maxilla. I'm going to wait to have clearance from her dentist before we started zolendronate, which she will would receive yearly until she is completed her anastrozole treatments.  She tells me she was never billed for the trastuzumab treatment here 07/28/2014. I have reviewed this with our billing people and it appears indeed the bill was never sent to [facility]. That is being aching care of at present. She will let me know if that issue has not been result before her next visit here.  Chauncey Cruel, MD   12/01/2014 11:11 AM

## 2014-12-01 NOTE — Telephone Encounter (Signed)
per pof to sch pt appt-cld& left message for appt time & date-pt to get updated copy b4 leaving chemo

## 2014-12-02 DIAGNOSIS — I639 Cerebral infarction, unspecified: Secondary | ICD-10-CM | POA: Insufficient documentation

## 2014-12-04 NOTE — Addendum Note (Signed)
Addended by: Jaci Carrel A on: 12/04/2014 10:20 AM   Modules accepted: Orders, Medications

## 2014-12-21 ENCOUNTER — Encounter (HOSPITAL_BASED_OUTPATIENT_CLINIC_OR_DEPARTMENT_OTHER)
Admission: RE | Disposition: A | Discharge status: Home / Self Care | Payer: Self-pay | Source: Ambulatory Visit | Attending: General Surgery

## 2014-12-21 ENCOUNTER — Ambulatory Visit (HOSPITAL_BASED_OUTPATIENT_CLINIC_OR_DEPARTMENT_OTHER)
Admission: RE | Admit: 2014-12-21 | Discharge: 2014-12-21 | Disposition: A | Discharge status: Home / Self Care | Payer: PPO | Source: Ambulatory Visit | Attending: General Surgery | Admitting: General Surgery

## 2014-12-21 ENCOUNTER — Encounter (HOSPITAL_BASED_OUTPATIENT_CLINIC_OR_DEPARTMENT_OTHER): Payer: Self-pay | Admitting: *Deleted

## 2014-12-21 DIAGNOSIS — C50911 Malignant neoplasm of unspecified site of right female breast: Secondary | ICD-10-CM | POA: Diagnosis present

## 2014-12-21 DIAGNOSIS — Z79899 Other long term (current) drug therapy: Secondary | ICD-10-CM | POA: Insufficient documentation

## 2014-12-21 DIAGNOSIS — Z803 Family history of malignant neoplasm of breast: Secondary | ICD-10-CM | POA: Insufficient documentation

## 2014-12-21 DIAGNOSIS — E079 Disorder of thyroid, unspecified: Secondary | ICD-10-CM | POA: Insufficient documentation

## 2014-12-21 DIAGNOSIS — M1990 Unspecified osteoarthritis, unspecified site: Secondary | ICD-10-CM | POA: Diagnosis not present

## 2014-12-21 DIAGNOSIS — Z7982 Long term (current) use of aspirin: Secondary | ICD-10-CM | POA: Insufficient documentation

## 2014-12-21 HISTORY — PX: PORT-A-CATH REMOVAL: SHX5289

## 2014-12-21 SURGERY — MINOR REMOVAL PORT-A-CATH
Anesthesia: LOCAL | Site: Chest | Laterality: Left

## 2014-12-21 MED ORDER — BUPIVACAINE HCL 0.5 % IJ SOLN
INTRAMUSCULAR | Status: DC | PRN
  Administered 2014-12-21: 8 mL via SUBCUTANEOUS

## 2014-12-21 MED ORDER — BUPIVACAINE HCL (PF) 0.5 % IJ SOLN
INTRAMUSCULAR | Status: AC
  Filled 2014-12-21: qty 30

## 2014-12-21 MED ORDER — LIDOCAINE HCL (PF) 1 % IJ SOLN
INTRAMUSCULAR | Status: AC
  Filled 2014-12-21: qty 30

## 2014-12-21 MED ORDER — BUPIVACAINE-EPINEPHRINE (PF) 0.25% -1:200000 IJ SOLN
INTRAMUSCULAR | Status: AC
  Filled 2014-12-21: qty 30

## 2014-12-21 MED ORDER — BUPIVACAINE HCL (PF) 0.25 % IJ SOLN
INTRAMUSCULAR | Status: AC
  Filled 2014-12-21: qty 30

## 2014-12-21 MED ORDER — LIDOCAINE-EPINEPHRINE (PF) 1 %-1:200000 IJ SOLN
INTRAMUSCULAR | Status: AC
  Filled 2014-12-21: qty 10

## 2014-12-21 SURGICAL SUPPLY — 30 items
BLADE SURG 15 STRL LF DISP TIS (BLADE) ×2 IMPLANT
BLADE SURG 15 STRL SS (BLADE) ×3
CHLORAPREP W/TINT 26ML (MISCELLANEOUS) ×3 IMPLANT
COVER BACK TABLE 60X90IN (DRAPES) ×3 IMPLANT
COVER MAYO STAND STRL (DRAPES) ×3 IMPLANT
DECANTER SPIKE VIAL GLASS SM (MISCELLANEOUS) ×1 IMPLANT
DRAPE LAPAROTOMY 100X72 PEDS (DRAPES) ×3 IMPLANT
DRAPE UTILITY XL STRL (DRAPES) ×3 IMPLANT
ELECT COATED BLADE 2.86 ST (ELECTRODE) ×2 IMPLANT
ELECT REM PT RETURN 9FT ADLT (ELECTROSURGICAL)
ELECTRODE REM PT RTRN 9FT ADLT (ELECTROSURGICAL) IMPLANT
GLOVE BIO SURGEON STRL SZ7.5 (GLOVE) ×3 IMPLANT
GLOVE BIOGEL PI IND STRL 7.0 (GLOVE) ×1 IMPLANT
GLOVE BIOGEL PI INDICATOR 7.0 (GLOVE) ×1
GLOVE ECLIPSE 6.5 STRL STRAW (GLOVE) ×2 IMPLANT
GLOVE EXAM NITRILE LRG STRL (GLOVE) ×2 IMPLANT
GOWN STRL REUS W/ TWL LRG LVL3 (GOWN DISPOSABLE) ×4 IMPLANT
GOWN STRL REUS W/TWL LRG LVL3 (GOWN DISPOSABLE) ×6
LIQUID BAND (GAUZE/BANDAGES/DRESSINGS) ×3 IMPLANT
NDL HYPO 25X1 1.5 SAFETY (NEEDLE) ×1 IMPLANT
NEEDLE HYPO 25X1 1.5 SAFETY (NEEDLE) ×3 IMPLANT
PACK BASIN DAY SURGERY FS (CUSTOM PROCEDURE TRAY) ×3 IMPLANT
PENCIL BUTTON HOLSTER BLD 10FT (ELECTRODE) IMPLANT
SLEEVE SCD COMPRESS KNEE MED (MISCELLANEOUS) IMPLANT
SUT MON AB 4-0 PC3 18 (SUTURE) ×3 IMPLANT
SUT VIC AB 3-0 SH 27 (SUTURE) ×3
SUT VIC AB 3-0 SH 27X BRD (SUTURE) ×2 IMPLANT
SYR CONTROL 10ML LL (SYRINGE) ×3 IMPLANT
TOWEL OR 17X24 6PK STRL BLUE (TOWEL DISPOSABLE) ×3 IMPLANT
TOWEL OR NON WOVEN STRL DISP B (DISPOSABLE) ×3 IMPLANT

## 2014-12-21 NOTE — Op Note (Signed)
12/21/2014  11:04 AM  PATIENT:  Barbara [name]  64 y.o. female  PRE-OPERATIVE DIAGNOSIS:  right breast cancer  POST-OPERATIVE DIAGNOSIS:  right breast cancer  PROCEDURE:  Procedure(s): MINOR REMOVAL OF PORT-A-CATH (Left)  SURGEON:  Surgeon(s) and Role:    * Jovita Kussmaul, MD - Primary  PHYSICIAN ASSISTANT:   ASSISTANTS: none   ANESTHESIA:   local  EBL:     BLOOD ADMINISTERED:none  DRAINS: none   LOCAL MEDICATIONS USED:  MARCAINE     SPECIMEN:  No Specimen  DISPOSITION OF SPECIMEN:  N/A  COUNTS:  YES  TOURNIQUET:  * No tourniquets in log *  DICTATION: .Dragon Dictation  After informed consent was obtained the patient was brought to the operating room and placed in the supine position on the operating room table. The patient's left chest area was prepped with ChloraPrep, allowed to dry, and draped in usual sterile manner. A combination of 1% lidocaine with epinephrine and half percent Marcaine were used to create a field block around the Port-A-Cath. A small incision was made with a 15 blade knife through her old incision with the 15 blade knife. The incision was carried through the subcutaneous tissue sharply with the 15 blade knife until the edge of the Port-A-Cath was encountered. The 2 anchoring stitches were divided and removed. The port was then pushed out of its pocket and with gentle traction was removed from the patient without difficulty. Herscher was held for several minutes until the area was completely hemostatic. The deep layer the wound was then closed with interrupted 3-0 Vicryl stitches. The skin was then closed with a running 4-0 Monocryl subcuticular stitch. Dermabond dressings were applied. The patient tolerated the procedure well. At the end of the case all needle sponge and instrument counts were correct. The patient was then taken to recovery in stable condition.  PLAN OF CARE: Discharge to home after PACU  PATIENT DISPOSITION:  PACU - hemodynamically  stable.   Delay start of Pharmacological VTE agent (>24hrs) due to surgical blood loss or risk of bleeding: not applicable

## 2014-12-21 NOTE — Interval H&P Note (Signed)
History and Physical Interval Note:  12/21/2014 10:10 AM  Barbara [name]  has presented today for surgery, with the diagnosis of right breast cancer  The various methods of treatment have been discussed with the patient and family. After consideration of risks, benefits and other options for treatment, the patient has consented to  Procedure(s): REMOVAL PORT-A-CATH (N/A) as a surgical intervention .  The patient's history has been reviewed, patient examined, no change in status, stable for surgery.  I have reviewed the patient's chart and labs.  Questions were answered to the patient's satisfaction.     TOTH III,Caralee Morea S

## 2014-12-21 NOTE — H&P (Signed)
Barbara [name] 12/18/2014 3:36 PM Location: [facility] Patient #: [id number] DOB: 04-16-1951 Married / Language: English / Race: White Female  History of Present Illness Sammuel Hines. Marlou Starks MD; 12/18/2014 3:54 PM) Patient words: port removal.  The patient is a 64 year old female who presents for a follow-up for Breast cancer. The patient is a 64 year old white female who is one year status post bilateral mastectomies and right sentinel node biopsy for a right-sided T1 cN0 breast cancer. She finished standard chemotherapy as well as Herceptin. She is taking anastrozole and tolerating it well. She has no complaints today. She is ready to have her port removed   Other Problems (Ammie Eversole, LPN; 3/53/6144 3:15 PM) Anxiety Disorder Arthritis Breast Cancer Cerebrovascular Accident Thyroid Disease  Past Surgical History (Ammie Eversole, LPN; 4/00/8676 1:95 PM) Breast Biopsy Right. Breast Reconstruction Bilateral. Bypass Surgery for Poor Blood Flow to Legs Colon Polyp Removal - Colonoscopy Mastectomy Bilateral. Open Inguinal Hernia Surgery Right. Oral Surgery  Diagnostic Studies History (Ammie Eversole, LPN; 0/93/2671 2:45 PM) Colonoscopy 1-5 years ago Mammogram 1-3 years ago Pap Smear 1-5 years ago  Allergies (Ammie Eversole, LPN; 07/02/9832 8:25 PM) Codeine Phosphate *ANALGESICS - OPIOID* Simvastatin *ANTIHYPERLIPIDEMICS* Buprenex *ANALGESICS - OPIOID*  Medication History (Ammie Eversole, LPN; 0/53/9767 3:41 PM) Levothyroxine Sodium (50MCG Tablet, Oral) Active. Atorvastatin Calcium (10MG  Tablet, Oral) Active. PARoxetine HCl (20MG  Tablet, Oral) Active. Vitamin D (Ergocalciferol) (50000UNIT Capsule, Oral) Active. Arimidex (1MG  Tablet, Oral) Active. Aspirin Childrens (81MG  Tablet Chewable, Oral) Active. Biotin Active. Flonase Allergy Relief (50MCG/ACT Suspension, Nasal) Active.  Social History (Ammie Eversole, LPN; 9/37/9024 0:97  PM) Alcohol use Occasional alcohol use. No caffeine use No drug use Tobacco use Never smoker.  Family History Aleatha Borer, LPN; 3/53/2992 4:26 PM) Arthritis Mother. Bleeding disorder Mother. Breast Cancer Mother, Sister. Cancer Father. Cerebrovascular Accident Father. Colon Polyps Father. Heart Disease Father. Heart disease in female family member before age 10 Heart disease in female family member before age 59 Hypertension Father. Melanoma Brother. Migraine Headache Daughter. Prostate Cancer Brother. Respiratory Condition Father. Thyroid problems Daughter, Sister.  Pregnancy / Birth History Aleatha Borer, LPN; [birth year]34/1962 2:29 PM) Age at menarche 45 years. Age of menopause 51-55 Contraceptive History Intrauterine device. Gravida 3 Irregular periods Maternal age 24-25 Para 2  Review of Systems (Ammie Eversole LPN; 7/98/9211 9:41 PM) General Not Present- Appetite Loss, Chills, Fatigue, Fever, Night Sweats, Weight Gain and Weight Loss. Skin Not Present- Change in Wart/Mole, Dryness, Hives, Jaundice, New Lesions, Non-Healing Wounds, Rash and Ulcer. HEENT Not Present- Earache, Hearing Loss, Hoarseness, Nose Bleed, Oral Ulcers, Ringing in the Ears, Seasonal Allergies, Sinus Pain, Sore Throat, Visual Disturbances, Wears glasses/contact lenses and Yellow Eyes. Respiratory Not Present- Bloody sputum, Chronic Cough, Difficulty Breathing, Snoring and Wheezing. Breast Not Present- Breast Mass, Breast Pain, Nipple Discharge and Skin Changes. Cardiovascular Not Present- Chest Pain, Difficulty Breathing Lying Down, Leg Cramps, Palpitations, Rapid Heart Rate, Shortness of Breath and Swelling of Extremities. Gastrointestinal Not Present- Abdominal Pain, Bloating, Bloody Stool, Change in Bowel Habits, Chronic diarrhea, Constipation, Difficulty Swallowing, Excessive gas, Gets full quickly at meals, Hemorrhoids, Indigestion, Nausea, Rectal Pain and Vomiting. Female  Genitourinary Not Present- Frequency, Nocturia, Painful Urination, Pelvic Pain and Urgency. Musculoskeletal Not Present- Back Pain, Joint Pain, Joint Stiffness, Muscle Pain, Muscle Weakness and Swelling of Extremities. Neurological Not Present- Decreased Memory, Fainting, Headaches, Numbness, Seizures, Tingling, Tremor, Trouble walking and Weakness. Psychiatric Not Present- Anxiety, Bipolar, Change in Sleep Pattern, Depression, Fearful and Frequent crying. Endocrine Not Present- Cold Intolerance, Excessive  Hunger, Hair Changes, Heat Intolerance, Hot flashes and New Diabetes. Hematology Not Present- Easy Bruising, Excessive bleeding, Gland problems, HIV and Persistent Infections.   Vitals (Ammie Eversole LPN; 8/92/1194 1:74 PM) 12/18/2014 3:37 PM Weight: 154.8 lb Height: 66in Body Surface Area: 1.81 m Body Mass Index: 24.99 kg/m Temp.: 81F(Oral)  Pulse: 84 (Regular)  BP: 132/74 (Sitting, Left Arm, Standard)    Physical Exam Eddie Dibbles S. Marlou Starks MD; 12/18/2014 3:55 PM) General Mental Status-Alert. General Appearance-Consistent with stated age. Hydration-Well hydrated. Voice-Normal.  Head and Neck Head-normocephalic, atraumatic with no lesions or palpable masses. Trachea-midline. Thyroid Gland Characteristics - normal size and consistency.  Eye Eyeball - Bilateral-Extraocular movements intact. Sclera/Conjunctiva - Bilateral-No scleral icterus.  Chest and Lung Exam Chest and lung exam reveals -quiet, even and easy respiratory effort with no use of accessory muscles and on auscultation, normal breath sounds, no adventitious sounds and normal vocal resonance. Inspection Chest Wall - Normal. Back - normal.  Breast Note: Both mastectomy incisions are healing nicely with no sign of infection. She has no palpable mass of either reconstructed breast. There is no palpable axillary, supraclavicular, or cervical lymphadenopathy.   Cardiovascular Cardiovascular  examination reveals -normal heart sounds, regular rate and rhythm with no murmurs and normal pedal pulses bilaterally.  Abdomen Inspection Inspection of the abdomen reveals - No Hernias. Skin - Scar - no surgical scars. Palpation/Percussion Palpation and Percussion of the abdomen reveal - Soft, Non Tender, No Rebound tenderness, No Rigidity (guarding) and No hepatosplenomegaly. Auscultation Auscultation of the abdomen reveals - Bowel sounds normal.  Neurologic Neurologic evaluation reveals -alert and oriented x 3 with no impairment of recent or remote memory. Mental Status-Normal.  Musculoskeletal Normal Exam - Left-Upper Extremity Strength Normal and Lower Extremity Strength Normal. Normal Exam - Right-Upper Extremity Strength Normal and Lower Extremity Strength Normal.  Lymphatic Head & Neck  General Head & Neck Lymphatics: Bilateral - Description - Normal. Axillary  General Axillary Region: Bilateral - Description - Normal. Tenderness - Non Tender. Femoral & Inguinal  Generalized Femoral & Inguinal Lymphatics: Bilateral - Description - Normal. Tenderness - Non Tender.    Assessment & Plan Eddie Dibbles S. Marlou Starks MD; 12/18/2014 3:51 PM) PRIMARY CANCER OF RIGHT FEMALE BREAST (174.9  C50.911) Impression: The patient is 1 year status post bilateral mastectomies for a right-sided breast cancer. She has finished all of her chemotherapy and is ready to have her port removed. I have discussed with her in detail the risks and benefits of the operation to remove the port as well as some of the technical aspects and she understands and wishes to proceed. She will continue to do regular self exams. She will continue to take anastrozole. I will plan to see her back in about 6 months. Current Plans  Follow up in 6 months or as needed   Signed by Luella Cook, MD (12/18/2014 3:55 PM)

## 2014-12-22 ENCOUNTER — Encounter (HOSPITAL_BASED_OUTPATIENT_CLINIC_OR_DEPARTMENT_OTHER): Payer: Self-pay | Admitting: General Surgery

## 2014-12-28 ENCOUNTER — Telehealth: Payer: Self-pay | Admitting: *Deleted

## 2014-12-28 NOTE — Telephone Encounter (Signed)
DR. Dante Gang SURGEON, STATES PT.'S JAW IS FINE. SHE MAY START FOSAMAX OR BONIVA.

## 2014-12-31 ENCOUNTER — Other Ambulatory Visit: Payer: Self-pay | Admitting: Oncology

## 2015-01-01 ENCOUNTER — Telehealth: Payer: Self-pay | Admitting: Oncology

## 2015-01-01 ENCOUNTER — Telehealth: Payer: Self-pay | Admitting: *Deleted

## 2015-01-01 NOTE — Telephone Encounter (Signed)
Per staff message and POF I have scheduled appts. Advised scheduler of appts. JMW  

## 2015-01-01 NOTE — Telephone Encounter (Signed)
per pof to sch pt zometa-sent MW emailt o sch-pt aware of appts

## 2015-01-02 ENCOUNTER — Telehealth: Payer: Self-pay | Admitting: *Deleted

## 2015-01-02 NOTE — Telephone Encounter (Signed)
Patient called asking what one hour infusion she is to receive on March 21, 2015.  Zometa is ordered and it's use given.  "I had notified him I do not wish to receive an infusion.  I would like to receive Boniva or Fosamax.  The side effects of the zometa are too much."  Informed her of the provider appointment on 03-21-2015.  "I would like an appointment to see him before April 27 to discuss this."  Will notify Dr. Jana Hakim.

## 2015-01-09 ENCOUNTER — Other Ambulatory Visit: Payer: Self-pay | Admitting: Oncology

## 2015-01-09 NOTE — Telephone Encounter (Signed)
Barbara [name], my note states that patient is indeed interested in boniva but she was going to see her dentist re possible extractions-- can you check to see if she has been cleared by ehr dentist to start [place]?  Thanks!

## 2015-01-12 ENCOUNTER — Other Ambulatory Visit: Payer: Self-pay | Admitting: *Deleted

## 2015-01-16 ENCOUNTER — Ambulatory Visit
Admission: RE | Admit: 2015-01-16 | Discharge: 2015-01-16 | Disposition: A | Discharge status: Home / Self Care | Payer: PPO | Source: Ambulatory Visit | Attending: Diagnostic Neuroimaging | Admitting: Diagnostic Neuroimaging

## 2015-01-16 DIAGNOSIS — I639 Cerebral infarction, unspecified: Secondary | ICD-10-CM

## 2015-01-16 DIAGNOSIS — R413 Other amnesia: Secondary | ICD-10-CM

## 2015-01-16 DIAGNOSIS — R4789 Other speech disturbances: Secondary | ICD-10-CM

## 2015-01-16 MED ORDER — GADOBENATE DIMEGLUMINE 529 MG/ML IV SOLN
14.0000 mL | Freq: Once | INTRAVENOUS | Status: AC | PRN
  Administered 2015-01-16: 14 mL via INTRAVENOUS

## 2015-01-22 ENCOUNTER — Other Ambulatory Visit: Payer: Self-pay | Admitting: Oncology

## 2015-01-22 NOTE — Progress Notes (Unsigned)
We received information from the patient's oral surgeon, Dr. Laurena Slimmer at the patient's jaw is fine and she may start her bisphosphonates at our discretion. She will have her first dose of zolendronate in April.

## 2015-01-29 ENCOUNTER — Encounter: Payer: Self-pay | Admitting: Diagnostic Neuroimaging

## 2015-01-29 ENCOUNTER — Ambulatory Visit (INDEPENDENT_AMBULATORY_CARE_PROVIDER_SITE_OTHER): Payer: PPO | Admitting: Diagnostic Neuroimaging

## 2015-01-29 VITALS — BP 132/76 | HR 74 | Ht 66.0 in | Wt 154.4 lb

## 2015-01-29 DIAGNOSIS — I639 Cerebral infarction, unspecified: Secondary | ICD-10-CM

## 2015-01-29 DIAGNOSIS — R413 Other amnesia: Secondary | ICD-10-CM | POA: Diagnosis not present
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
## 2015-01-29 NOTE — Patient Instructions (Signed)
Continue current plan. Stay active (mentally and physically).

## 2015-02-16 ENCOUNTER — Other Ambulatory Visit: Payer: Self-pay | Admitting: Nurse Practitioner

## 2015-02-26 ENCOUNTER — Other Ambulatory Visit: Payer: Self-pay | Admitting: *Deleted

## 2015-02-26 DIAGNOSIS — C50411 Malignant neoplasm of upper-outer quadrant of right female breast: Secondary | ICD-10-CM

## 2015-02-26 MED ORDER — ANASTROZOLE 1 MG PO TABS: 1.0000 mg | ORAL_TABLET | Freq: Every day | ORAL | Status: DC

## 2015-02-26 NOTE — Telephone Encounter (Signed)
Medication refill: Arimidex sent to [facility] (New pharmacy per patient). Patient verbalized understanding.

## 2015-03-13 ENCOUNTER — Other Ambulatory Visit: Payer: Self-pay | Admitting: *Deleted

## 2015-03-13 DIAGNOSIS — C50411 Malignant neoplasm of upper-outer quadrant of right female breast: Secondary | ICD-10-CM

## 2015-03-14 ENCOUNTER — Other Ambulatory Visit (HOSPITAL_BASED_OUTPATIENT_CLINIC_OR_DEPARTMENT_OTHER): Payer: PPO

## 2015-03-14 DIAGNOSIS — C50411 Malignant neoplasm of upper-outer quadrant of right female breast: Secondary | ICD-10-CM

## 2015-03-14 LAB — CBC WITH DIFFERENTIAL/PLATELET
BASO%: 0.9 % (ref 0.0–2.0)
Basophils Absolute: 0 10*3/uL (ref 0.0–0.1)
EOS%: 3.9 % (ref 0.0–7.0)
Eosinophils Absolute: 0.2 10*3/uL (ref 0.0–0.5)
HCT: 39.4 % (ref 34.8–46.6)
HGB: 13.2 g/dL (ref 11.6–15.9)
LYMPH#: 1.8 10*3/uL (ref 0.9–3.3)
LYMPH%: 34.6 % (ref 14.0–49.7)
MCH: 30.3 pg (ref 25.1–34.0)
MCHC: 33.6 g/dL (ref 31.5–36.0)
MCV: 90.1 fL (ref 79.5–101.0)
MONO#: 0.4 10*3/uL (ref 0.1–0.9)
MONO%: 7 % (ref 0.0–14.0)
NEUT#: 2.8 10*3/uL (ref 1.5–6.5)
NEUT%: 53.6 % (ref 38.4–76.8)
Platelets: 240 10*3/uL (ref 145–400)
RBC: 4.37 10*6/uL (ref 3.70–5.45)
RDW: 13.4 % (ref 11.2–14.5)
WBC: 5.1 10*3/uL (ref 3.9–10.3)

## 2015-03-14 LAB — COMPREHENSIVE METABOLIC PANEL (CC13)
ALBUMIN: 3.7 g/dL (ref 3.5–5.0)
ALK PHOS: 71 U/L (ref 40–150)
ALT: 12 U/L (ref 0–55)
AST: 13 U/L (ref 5–34)
Anion Gap: 11 mEq/L (ref 3–11)
BUN: 20.5 mg/dL (ref 7.0–26.0)
CO2: 22 mEq/L (ref 22–29)
CREATININE: 0.8 mg/dL (ref 0.6–1.1)
Calcium: 8.9 mg/dL (ref 8.4–10.4)
Chloride: 106 mEq/L (ref 98–109)
EGFR: 73 mL/min/{1.73_m2} — ABNORMAL LOW (ref >90)
Glucose: 92 mg/dl (ref 70–140)
POTASSIUM: 4.6 meq/L (ref 3.5–5.1)
Sodium: 139 mEq/L (ref 136–145)
Total Bilirubin: 0.33 mg/dL (ref 0.20–1.20)
Total Protein: 6.7 g/dL (ref 6.4–8.3)

## 2015-03-21 ENCOUNTER — Ambulatory Visit (HOSPITAL_BASED_OUTPATIENT_CLINIC_OR_DEPARTMENT_OTHER): Payer: PPO | Admitting: Oncology

## 2015-03-21 ENCOUNTER — Ambulatory Visit (HOSPITAL_BASED_OUTPATIENT_CLINIC_OR_DEPARTMENT_OTHER): Payer: PPO

## 2015-03-21 VITALS — BP 137/63 | HR 59 | Temp 98.0°F | Resp 18 | Ht 66.0 in | Wt 152.1 lb

## 2015-03-21 DIAGNOSIS — M859 Disorder of bone density and structure, unspecified: Secondary | ICD-10-CM | POA: Diagnosis not present

## 2015-03-21 DIAGNOSIS — C50411 Malignant neoplasm of upper-outer quadrant of right female breast: Secondary | ICD-10-CM

## 2015-03-21 DIAGNOSIS — M8580 Other specified disorders of bone density and structure, unspecified site: Secondary | ICD-10-CM

## 2015-03-21 DIAGNOSIS — Z170 Estrogen receptor positive status [ER+]: Secondary | ICD-10-CM

## 2015-03-21 DIAGNOSIS — I639 Cerebral infarction, unspecified: Secondary | ICD-10-CM

## 2015-03-21 MED ORDER — ZOLEDRONIC ACID 4 MG/100ML IV SOLN
4.0000 mg | Freq: Once | INTRAVENOUS | Status: AC
  Administered 2015-03-21: 4 mg via INTRAVENOUS
  Filled 2015-03-21: qty 100

## 2015-03-21 MED ORDER — ZOLEDRONIC ACID 4 MG/5ML IV CONC: 4.0000 mg | Freq: Once | INTRAVENOUS | Status: DC

## 2015-03-21 MED ORDER — SODIUM CHLORIDE 0.9 % IV SOLN
Freq: Once | INTRAVENOUS | Status: AC
  Administered 2015-03-21: 14:00:00 via INTRAVENOUS

## 2015-03-21 NOTE — Patient Instructions (Signed)

## 2015-03-21 NOTE — Progress Notes (Signed)
ID: Barbara [name] OB: 09-11-1951  MR#: [id number]  BSJ#:[id number]  PCP: [name], MD GYN:  Felipa Emory SU: [facility] Age OTHER TM:[id number] Blair Heys  CHIEF COMPLAINT: Right Breast Cancer  CURRENT TREATMENT: Anastrozole  BREAST CANCER HISTORY: From the original intake note:  The patient has a very strong family history for breast cancer, although she has tested  negative for the BRCA mutations (as have 2 of her sisters). She had been receiving breast MRI in addition to mammography, but had not had this test performed since October of 2010. More recently, 07/22/2013, routine screening mammography with tomography at [facility] showed no worrisome findings in the setting of extremely dense breasts.  MRI of the breast 09/08/2013, however, showed in the right upper outer quadrant an irregular mass measuring 1.7 cm. This was new as compared to the prior MRI from 2010. There were no abnormal lymph nodes and no other areas of concern in either breast. 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 The MIB-1 was 25%.  The patient's subsequent history is as detailed below  INTERVAL HISTORY: Barbara [name] returns today for followup of her right breast cancer accompanied by her husband Ed. the interval history is generally unremarkable. The exercise every other day at [employer]. She is also doing quite a bit of gardening. They have trips planned to the [facility] in May and to [place] in July. She is tolerating anastrozole with no significant side effects and in particular night sweats/hot flashes are not a big concern. She does not complain of vaginal dryness problems. She has not experienced the arthralgias/myalgias that many patients can have on this medication.  REVIEW OF SYSTEMS: Barbara [name]  has had 2 falls, neither of them directly related to her cerebellar dysfunction. One occurred in the garden when she stepped over brick, and the other one when she was doing step training in the gym and didn't realize the height of the step changed. She still has pain in her left thumb, chiefly at the base. She denies unusual headaches, visual changes, nausea, or vomiting. A detailed review of systems today was otherwise stable.  PAST MEDICAL HISTORY: Past Medical History  Diagnosis Date  . Breast cancer   . Hashimoto's thyroiditis     with goiter  . Hypercholesteremia   . 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  Narrowing of the 4th and 5th lumbar interspaces.   Marland Kitchen Spina bifida     occutta L5  . Anxiety   . GERD (gastroesophageal reflux disease)   . Headache(784.0)     hx migraines  . Varicose vein     PAST SURGICAL HISTORY: Past Surgical History  Procedure Laterality Date  . Hernia repair  1976    Left groin  . Tubal ligation    . Cystoscopy with urethral caruncle    . Mastectomy w/ sentinel node biopsy Bilateral 10/13/2013    Procedure:  BILATERAL MASTECTOMY WITH RIGHT SENTINEL LYMPH NODE BIOPSY;  Surgeon: Merrie Roof, MD;  Location: [facility];  Service: General;  Laterality: Bilateral;  . Portacath placement Left 10/13/2013    Procedure: INSERTION PORT-A-CATH;  Surgeon: Merrie Roof, MD;  Location: [facility];  Service: General;  Laterality: Left;  . Breast reconstruction with placement of  tissue expander and flex hd (acellular hydrated dermis) Bilateral 10/13/2013    Procedure: PLACEMENT OF BILATERAL TISSUE EXPANDER FOR BREAST RECONSTRUCTION ;  Surgeon: Crissie Reese, MD;  Location: [facility];  Service: Plastics;  Laterality: Bilateral;  . Wisdom tooth extraction  1990  . Port-a-cath removal Left 12/21/2014    Procedure: MINOR REMOVAL OF PORT-A-CATH;  Surgeon: Autumn Messing III, MD;  Location: [facility];  Service: General;   Laterality: Left;    FAMILY HISTORY Family History  Problem Relation Age of Onset  . Breast cancer Mother 27  . Lung cancer Father   . Breast cancer Sister 13    Bilateral breast cancer with 2nd dx at 72  . Breast cancer Maternal Aunt 52  . Breast cancer Sister 88  . Lung cancer Maternal Uncle   . Leukemia Paternal Uncle   . Melanoma Brother 52    on ear  . Prostate cancer Brother 29  . Cancer Cousin     maternal cousin with cancer - NOS  The patient's mother was diagnosed with breast cancer the age of 41. She died at the age of 43. The patient's father died at the age of 25, with a history of lung cancer. The patient had 2 brothers, and 2 sisters. The patient's sister Barbara [name] has a history of bilateral breast cancer in her sister Barbara [name] a history of left breast cancer. Both have been tested and are found to be negative for a BRCA one or 2 mutation. The patient's mother's sister also had a history of breast cancer. The patient herself has been tested for the BRCA mutation as well as through [facility]. No mutation has been file.   GYNECOLOGIC HISTORY:  Menarche age 2, first live birth age 32. The patient is GX P2. She stopped having periods in her early 65s. She did not take hormone replacement.  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 Her husband Ed used to work for [employer]. Daughter Barbara [name] works as a Music therapist in [place]. Son Barbara [name] is a drug representative for [employer] (diabetes). His wife, and December, is a Ship broker in [place] emergency room's. The patient has 5 grandchildren. She at tends [place]    ADVANCED DIRECTIVES:  In place   HEALTH MAINTENANCE:  (Updated 02/10/2014) History  Substance Use Topics  . Smoking status: Never Smoker   . Smokeless tobacco: Never Used  . Alcohol Use: 0.0 oz/week    0 Standard drinks or equivalent per week      Comment: rarely 3-4 drinks yearly     Colonoscopy: 2013/   PAP: 2013/Dr. Sabra Heck  Bone density: 2013/osteopenia  Lipid panel: Not on file/Dr. Laurann Montana   Allergies  Allergen Reactions  . Buprenex [Buprenorphine] Nausea And Vomiting and Other (See Comments)    Oversedation  . Simvastatin Other (See Comments)    Myalgias   . Codeine Hives, Itching and Rash    Current Outpatient Prescriptions  Medication Sig Dispense Refill  . anastrozole (ARIMIDEX) 1 MG tablet Take 1 tablet (1 mg total) by mouth daily. 90 tablet 3  . aspirin 81 MG tablet Take 81 mg by mouth daily.    Marland Kitchen atorvastatin (LIPITOR) 10 MG tablet Take 10 mg by mouth at bedtime.     Marland Kitchen BIOTIN PO Take by mouth.    . Calcium-Vitamin D-Vitamin K (VIACTIV) [id number] MG-UNT-MCG CHEW Chew 1 tablet by mouth.     Marland Kitchen  fluticasone (FLONASE) 50 MCG/ACT nasal spray Place 2 sprays into both nostrils daily as needed.     . levothyroxine (SYNTHROID, LEVOTHROID) 50 MCG tablet Take 50 mcg by mouth daily before breakfast. Take 50 mcg 1 hours before breakfast.    . Nutritional Supplements (JUICE PLUS FIBRE) LIQD Take by mouth.    . PARoxetine (PAXIL) 20 MG tablet Take 20 mg by mouth at bedtime.     . PENNSAID 2 % SOLN   3   No current facility-administered medications for this visit.    OBJECTIVE:  Middle-aged white woman in no acute distress Filed Vitals:   03/21/15 1213  BP: 137/63  Pulse: 59  Temp: 98 F (36.7 C)  Resp: 18  Body mass index is 24.56 kg/(m^2).   ECOG:  1 Filed Weights   03/21/15 1213  Weight: 152 lb 1.6 oz (68.992 kg)   Sclerae unicteric, EOMs intact Oropharynx clear; dentition in good repair No cervical or supraclavicular adenopathy Lungs no rales or rhonchi Heart regular rate and rhythm Abd soft, nontender, positive bowel sounds MSK no focal spinal tenderness, no upper extremity lymphedema Neuro: Nonfocal; well-oriented; positive affect Breasts: Status post bilateral mastectomies with saline implants  in place. There is no evidence of local recurrence. Both axillae are benign.   LAB RESULTS:   Lab Results  Component Value Date   WBC 5.1 03/14/2015   NEUTROABS 2.8 03/14/2015   HGB 13.2 03/14/2015   HCT 39.4 03/14/2015   MCV 90.1 03/14/2015   PLT 240 03/14/2015      Chemistry      Component Value Date/Time   NA 139 03/14/2015 0847   NA 138 10/12/2013 0940   K 4.6 03/14/2015 0847   K 4.3 10/12/2013 0940   CL 102 10/12/2013 0940   CO2 22 03/14/2015 0847   CO2 27 10/12/2013 0940   BUN 20.5 03/14/2015 0847   BUN 18 10/12/2013 0940   CREATININE 0.8 03/14/2015 0847   CREATININE 0.91 10/12/2013 0940      Component Value Date/Time   CALCIUM 8.9 03/14/2015 0847   CALCIUM 9.7 10/12/2013 0940   ALKPHOS 71 03/14/2015 0847   AST 13 03/14/2015 0847   ALT 12 03/14/2015 0847   BILITOT 0.33 03/14/2015 0847      STUDIES:  ASSESSMENT: 64 y.o. BRCA negative [place] woman   (1)  status post right breast biopsy 09/21/2013 for a clinical T1c N0, stage IA invasive ductal carcinoma, grade 2, triple positive, with a HER-2: CEP 17 ratio of 5.55 and an average copy number per cell of  13.05. The MIB-1-1 was 25%  (2) status post bilateral mastectomies 10/13/2013 showing:  (a) on the left, atypical ductal hyperplasia  (b) on the right, a pT1c pN0, stage IA invasive ductal carcinoma, grade 2  (c) completed bilateral saline implant reconstruction 07/28/2014  (3) received paclitaxel/trastuzumab, with paclitaxel given on days 1, 8, and 15 and trastuzumab given every 2 weeks on days 1 and 15 of each 28 day cycle, for 4 cycles (12 doses of paclitaxel) completed 03/24/2014  (4) continued trastuzumab every three weeks through 12/01/2014   (a) echocardiogram 10/02/2014 showed a well preserved ejection fraction  (5) The patient participated in the [facility] study remotely, completing 5 years of tamoxifen in the late 1990s  (6) started anastrozole May 2015  (a) bone density 06/09/2014 showed a T  score of -1.6 (decreased from prior)  (b) zolendronate started 03/21/2015, to be repeated yearly   PLAN: [place] is doing terrific as far   as her breast cancer is concerned and she has made a wonderful recovery as far as her cerebellar stroke is concerned. She is tolerating the Arimidex with no significant side effects and is obtaining inadequate price. The plan is going to be to continue that for a total of 5 years.  We are starting zolendronate today. This plan is to continue that once a year until she completes her 5 years on anastrozole. She has a very good understanding of the possible toxicities, side effects and complications of treatment.  I will see her again in November. That will be 2 years out from her bilateral mastectomies. If all is going well at that time I will start seeing her on a once a year basis thereafter MAGRINAT,GUSTAV C, MD   03/21/2015 12:17 PM 

## 2015-03-22 ENCOUNTER — Telehealth: Payer: Self-pay | Admitting: *Deleted

## 2015-03-22 ENCOUNTER — Other Ambulatory Visit: Payer: Self-pay | Admitting: Oncology

## 2015-03-22 NOTE — Telephone Encounter (Signed)
Received voice message from patient stating,"I took my first Zometa infusion yesterday and had a headache last night. Is this a reaction to Zometa?" Discussed this with pharmacy. Patient is to receive Zometa on a yearly basis. Instructed patient to take tylenol and call [facility] back if headache doesn't go away.

## 2015-03-29 ENCOUNTER — Telehealth: Payer: Self-pay | Admitting: Oncology

## 2015-03-29 NOTE — Telephone Encounter (Signed)
lvm for pt regarding to NOV appt.. °

## 2015-04-05 ENCOUNTER — Ambulatory Visit (INDEPENDENT_AMBULATORY_CARE_PROVIDER_SITE_OTHER): Payer: PPO | Admitting: Obstetrics & Gynecology

## 2015-04-05 ENCOUNTER — Encounter: Payer: Self-pay | Admitting: Obstetrics & Gynecology

## 2015-04-05 VITALS — BP 130/80 | HR 68 | Resp 20 | Ht 66.25 in | Wt 151.0 lb

## 2015-04-05 DIAGNOSIS — Z01419 Encounter for gynecological examination (general) (routine) without abnormal findings: Secondary | ICD-10-CM

## 2015-04-05 NOTE — Progress Notes (Signed)
64 y.o. G2P2 MarriedCaucasianF here for annual exam.  Doing well.  No vaginal bleeding.  Reviewed notes in EPIC from last year until now.  Cerebellar stroke noted in chart.  Asked pt about this.  Reports she was having some trouble with doing "routine" activities and had some trouble with recall.  Was referred by Dr. Jana Hakim to Dr. Tish Frederickson.  Reports having trouble with serial 7 form 100 counting.  Had MRI showing a cerebellar stroke.  He wanted her to do some additional testing.  She felt like a lot of this was related to stress with work.  Retired in January 2016.  Did have follow up and was much better.  Has been released from neurology unless she has some new issues.    Patient's last menstrual period was 11/24/2004.          Sexually active: No.  The current method of family planning is tubal ligation.    Exercising: Yes.    3 days/week at gym Smoker:  no  Health Maintenance: Pap:  03/23/14 WNL, 2013 neg HR HPV History of abnormal Pap:  no MMG:  07/22/13 3D-normal, 10/14-MRI-breast cancer Colonoscopy:  2012-repeat in 5 years BMD:   06/09/14-had Reclast TDaP:  12/31/06 Screening Labs: PCP, Hb today: PCP, Urine today: PCP   reports that she has never smoked. She has never used smokeless tobacco. She reports that she does not drink alcohol or use illicit drugs.  Past Medical History  Diagnosis Date  . Breast cancer   . Hashimoto's thyroiditis     with goiter  . Hypercholesteremia   . 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  Narrowing of the 4th and 5th lumbar interspaces.   Marland Kitchen Spina bifida     occutta L5  . Anxiety   . GERD (gastroesophageal reflux disease)   . Headache(784.0)     hx migraines  . Varicose vein     Past Surgical History  Procedure Laterality Date  . Hernia repair  1976    Left groin  . Tubal ligation    . Cystoscopy with urethral caruncle    . Mastectomy w/ sentinel node biopsy Bilateral 10/13/2013    Procedure:   BILATERAL MASTECTOMY WITH RIGHT SENTINEL LYMPH NODE BIOPSY;  Surgeon: Merrie Roof, MD;  Location: [place];  Service: General;  Laterality: Bilateral;  . Portacath placement Left 10/13/2013    Procedure: INSERTION PORT-A-CATH;  Surgeon: Merrie Roof, MD;  Location: [place];  Service: General;  Laterality: Left;  . Breast reconstruction with placement of tissue expander and flex hd (acellular hydrated dermis) Bilateral 10/13/2013    Procedure: PLACEMENT OF BILATERAL TISSUE EXPANDER FOR BREAST RECONSTRUCTION ;  Surgeon: Crissie Reese, MD;  Location: [place];  Service: Plastics;  Laterality: Bilateral;  . Wisdom tooth extraction  1990  . Port-a-cath removal Left 12/21/2014    Procedure: MINOR REMOVAL OF PORT-A-CATH;  Surgeon: Autumn Messing III, MD;  Location: [place];  Service: General;  Laterality: Left;    Current Outpatient Prescriptions  Medication Sig Dispense Refill  . anastrozole (ARIMIDEX) 1 MG tablet Take 1 tablet (1 mg total) by mouth daily. 90 tablet 3  . aspirin 81 MG tablet Take 81 mg by mouth daily.    Marland Kitchen atorvastatin (LIPITOR) 10 MG tablet Take 10 mg by mouth at bedtime.     Marland Kitchen BIOTIN PO Take by mouth.    . Calcium-Vitamin D-Vitamin K (  VIACTIV) [id number] MG-UNT-MCG CHEW Chew 1 tablet by mouth.     . fluticasone (FLONASE) 50 MCG/ACT nasal spray Place 2 sprays into both nostrils daily as needed.     Marland Kitchen levothyroxine (SYNTHROID, LEVOTHROID) 50 MCG tablet Take 50 mcg by mouth daily before breakfast. Take 50 mcg 1 hours before breakfast.    . Nutritional Supplements (JUICE PLUS FIBRE) LIQD Take by mouth.    Marland Kitchen PARoxetine (PAXIL) 20 MG tablet Take 20 mg by mouth at bedtime.      No current facility-administered medications for this visit.    Family History  Problem Relation Age of Onset  . Breast cancer Mother 83  . Lung cancer Father   . Breast cancer Sister 33    Bilateral breast cancer with 2nd dx at 1  . Breast cancer Maternal Aunt 52  . Breast cancer Sister 39   . Lung cancer Maternal Uncle   . Leukemia Paternal Uncle   . Melanoma Brother 52    on ear  . Prostate cancer Brother 69  . Cancer Cousin     maternal cousin with cancer - NOS    ROS:  Pertinent items are noted in HPI.  Otherwise, a comprehensive ROS was negative.  Exam:   General appearance: alert, cooperative and appears stated age Head: Normocephalic, without obvious abnormality, atraumatic Neck: no adenopathy, supple, symmetrical, trachea midline and thyroid normal to inspection and palpation Lungs: clear to auscultation bilaterally Breasts: no masses, bilateral implants after bilateral mastectomy, no LADs Heart: regular rate and rhythm Abdomen: soft, non-tender; bowel sounds normal; no masses,  no organomegaly Extremities: extremities normal, atraumatic, no cyanosis or edema Skin: Skin color, texture, turgor normal. No rashes or lesions Lymph nodes: Cervical, supraclavicular, and axillary nodes normal. No abnormal inguinal nodes palpated Neurologic: Grossly normal   Pelvic: External genitalia:  no lesions              Urethra:  normal appearing urethra with no masses, tenderness or lesions              Bartholins and Skenes: normal                 Vagina: normal appearing vagina with normal color and discharge, no lesions              Cervix: no lesions              Pap taken: No. Bimanual Exam:  Uterus:  normal size, contour, position, consistency, mobility, non-tender              Adnexa: normal adnexa and no mass, fullness, tenderness               Rectovaginal: Confirms               Anus:  normal sphincter tone, no lesions  Chaperone was present for exam.   A:  Well Woman with normal exam Breast cancer, ER+/PR+, her-2 positive. Underwent bilateral mastectomy and chemotherapy. No on Arimidex.   Strong family hx of breast cancer in mother, m. Aunt, two sisters Osteopenia  P: Mammograms no longer needed Pap 2015. Neg HR HPV 1/13.  No pap this  year. Appt with Dr. Laurann Montana 05/11/15.  Pneumonia and Zostavax vaccines are due. return annually or prn

## 2015-05-21 ENCOUNTER — Other Ambulatory Visit: Payer: Self-pay

## 2015-10-02 ENCOUNTER — Other Ambulatory Visit: Payer: Self-pay

## 2015-10-02 DIAGNOSIS — C50411 Malignant neoplasm of upper-outer quadrant of right female breast: Secondary | ICD-10-CM

## 2015-10-03 ENCOUNTER — Telehealth: Payer: Self-pay | Admitting: Oncology

## 2015-10-03 ENCOUNTER — Other Ambulatory Visit (HOSPITAL_BASED_OUTPATIENT_CLINIC_OR_DEPARTMENT_OTHER): Payer: PPO

## 2015-10-03 ENCOUNTER — Ambulatory Visit (HOSPITAL_BASED_OUTPATIENT_CLINIC_OR_DEPARTMENT_OTHER): Payer: PPO | Admitting: Oncology

## 2015-10-03 VITALS — BP 146/67 | HR 66 | Temp 98.7°F | Resp 18 | Ht 66.25 in | Wt 150.9 lb

## 2015-10-03 DIAGNOSIS — C50411 Malignant neoplasm of upper-outer quadrant of right female breast: Secondary | ICD-10-CM | POA: Diagnosis not present

## 2015-10-03 DIAGNOSIS — Z170 Estrogen receptor positive status [ER+]: Secondary | ICD-10-CM | POA: Diagnosis not present

## 2015-10-03 DIAGNOSIS — Z79811 Long term (current) use of aromatase inhibitors: Secondary | ICD-10-CM

## 2015-10-03 LAB — CBC WITH DIFFERENTIAL/PLATELET
BASO%: 0.9 % (ref 0.0–2.0)
Basophils Absolute: 0.1 10*3/uL (ref 0.0–0.1)
EOS ABS: 0.2 10*3/uL (ref 0.0–0.5)
EOS%: 3.1 % (ref 0.0–7.0)
HEMATOCRIT: 40.7 % (ref 34.8–46.6)
HGB: 13.6 g/dL (ref 11.6–15.9)
LYMPH%: 32.8 % (ref 14.0–49.7)
MCH: 30.6 pg (ref 25.1–34.0)
MCHC: 33.4 g/dL (ref 31.5–36.0)
MCV: 91.7 fL (ref 79.5–101.0)
MONO#: 0.5 10*3/uL (ref 0.1–0.9)
MONO%: 7.2 % (ref 0.0–14.0)
NEUT#: 3.6 10*3/uL (ref 1.5–6.5)
NEUT%: 56 % (ref 38.4–76.8)
PLATELETS: 259 10*3/uL (ref 145–400)
RBC: 4.44 10*6/uL (ref 3.70–5.45)
RDW: 12.8 % (ref 11.2–14.5)
WBC: 6.4 10*3/uL (ref 3.9–10.3)
lymph#: 2.1 10*3/uL (ref 0.9–3.3)

## 2015-10-03 LAB — COMPREHENSIVE METABOLIC PANEL (CC13)
ALBUMIN: 3.9 g/dL (ref 3.5–5.0)
ALT: 13 U/L (ref 0–55)
ANION GAP: 8 meq/L (ref 3–11)
AST: 15 U/L (ref 5–34)
Alkaline Phosphatase: 65 U/L (ref 40–150)
BILIRUBIN TOTAL: 0.43 mg/dL (ref 0.20–1.20)
BUN: 21.4 mg/dL (ref 7.0–26.0)
CALCIUM: 9.5 mg/dL (ref 8.4–10.4)
CO2: 26 mEq/L (ref 22–29)
CREATININE: 0.9 mg/dL (ref 0.6–1.1)
Chloride: 104 mEq/L (ref 98–109)
EGFR: 70 mL/min/{1.73_m2} — ABNORMAL LOW (ref >90)
Glucose: 85 mg/dl (ref 70–140)
Potassium: 4.7 mEq/L (ref 3.5–5.1)
Sodium: 138 mEq/L (ref 136–145)
TOTAL PROTEIN: 7.1 g/dL (ref 6.4–8.3)

## 2015-10-03 MED ORDER — CHOLECALCIFEROL 125 MCG (5000 UT) PO TABS: 5000.0000 [IU] | ORAL_TABLET | Freq: Every day | ORAL | Status: AC

## 2015-10-03 NOTE — Telephone Encounter (Signed)
Appointments made and avs printed for patient °

## 2015-10-03 NOTE — Progress Notes (Signed)
ID: Barbara [name] OB: September 27, 1951  MR#: [id number]  JOA#:[id number]  PCP: [name], MD GYN:  Felipa Emory SU: [facility] Age OTHER SW:FUXNAT Blair Heys  CHIEF COMPLAINT: Right Breast Cancer  CURRENT TREATMENT: Anastrozole  BREAST CANCER HISTORY: From the original intake note:  The patient has a very strong family history for breast cancer, although she has tested  negative for the BRCA mutations (as have 2 of her sisters). She had been receiving breast MRI in addition to mammography, but had not had this test performed since October of 2010. More recently, 07/22/2013, routine screening mammography with tomography at [facility] showed no worrisome findings in the setting of extremely dense breasts.  MRI of the breast 09/08/2013, however, showed in the right upper outer quadrant an irregular mass measuring 1.7 cm. This was new as compared to the prior MRI from 2010. There were no abnormal lymph nodes and no other areas of concern in either breast. 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 The MIB-1 was 25%.  The patient's subsequent history is as detailed below  INTERVAL HISTORY: Barbara [name] returns today for followup of her right breast cancer accompanied by her husband Ed. she continues on anastrozole, which she tolerates well. Hot flashes are not a major deal. She does have vaginal dryness problems, but no arthralgias or myalgias. She obtains a drug at a good price.   REVIEW OF SYSTEMS: Barbara [name] is exercising regularly chiefly by walking. She is very concerned about her vitamin D and has been following this closely with Dr. Laurann Montana. She is doing a lot yard work but not going to [employer] as much as before. She has some arthritis problems here and there but not any more frequent or  intense than prior. A detailed review of systems was otherwise noncontributory  PAST MEDICAL HISTORY: Past Medical History  Diagnosis Date  . Breast cancer   . Hashimoto's thyroiditis     with goiter  . Hypercholesteremia   . 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  Narrowing of the 4th and 5th lumbar interspaces.   Marland Kitchen Spina bifida     occutta L5  . Anxiety   . GERD (gastroesophageal reflux disease)   . Headache(784.0)     hx migraines  . Varicose vein     PAST SURGICAL HISTORY: Past Surgical History  Procedure Laterality Date  . Hernia repair  1976    Left groin  . Tubal ligation    . Cystoscopy with urethral caruncle    . Mastectomy w/ sentinel node biopsy Bilateral 10/13/2013    Procedure:  BILATERAL MASTECTOMY WITH RIGHT SENTINEL LYMPH NODE BIOPSY;  Surgeon: Merrie Roof, MD;  Location: [place];  Service: General;  Laterality: Bilateral;  . Portacath placement Left 10/13/2013    Procedure: INSERTION PORT-A-CATH;  Surgeon: Merrie Roof, MD;  Location: [place];  Service: General;  Laterality: Left;  . Breast reconstruction with placement of tissue expander and flex hd (acellular hydrated dermis) Bilateral 10/13/2013    Procedure: PLACEMENT OF BILATERAL TISSUE EXPANDER FOR BREAST RECONSTRUCTION ;  Surgeon: Crissie Reese, MD;  Location: [place];  Service: Plastics;  Laterality: Bilateral;  . Wisdom tooth extraction  1990  . Port-a-cath removal Left 12/21/2014    Procedure:  MINOR REMOVAL OF PORT-A-CATH;  Surgeon: Autumn Messing III, MD;  Location: [place];  Service: General;  Laterality: Left;    FAMILY HISTORY Family History  Problem Relation Age of Onset  . Breast cancer Mother 90  . Lung cancer Father   . Breast cancer Sister 13    Bilateral breast cancer with 2nd dx at 77  . Breast cancer Maternal Aunt 52  . Breast cancer Sister 66  . Lung cancer Maternal Uncle   . Leukemia Paternal Uncle   . Melanoma  Brother 52    on ear  . Prostate cancer Brother 24  . Cancer Cousin     maternal cousin with cancer - NOS  The patient's mother was diagnosed with breast cancer the age of 56. She died at the age of 39. The patient's father died at the age of 66, with a history of lung cancer. The patient had 2 brothers, and 2 sisters. The patient's sister Barbara [name] has a history of bilateral breast cancer in her sister Barbara [name] a history of left breast cancer. Both have been tested and are found to be negative for a BRCA one or 2 mutation. The patient's mother's sister also had a history of breast cancer. The patient herself has been tested for the BRCA mutation as well as through [facility]. No mutation has been file.   GYNECOLOGIC HISTORY:  Menarche age 12, first live birth age 58. The patient is GX P2. She stopped having periods in her early 13s. She did not take hormone replacement.  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 Her husband Ed used to work for [employer]. Daughter Barbara [name] works as a Music therapist in [place]. Son Barbara [name] is a drug representative for [employer] (diabetes). His wife, and December, is a Ship broker in [place] emergency room's. The patient has 5 grandchildren. She at tends [place]    ADVANCED DIRECTIVES:  In place   HEALTH MAINTENANCE:  (Updated 02/10/2014) Social History  Substance Use Topics  . Smoking status: Never Smoker   . Smokeless tobacco: Never Used  . Alcohol Use: No     Colonoscopy: 2013/ [facility]  PAP: 2013/Dr. Sabra Heck  Bone density: 2013/osteopenia  Lipid panel: Not on file/Dr. Laurann Montana   Allergies  Allergen Reactions  . Buprenex [Buprenorphine] Nausea And Vomiting and Other (See Comments)    Oversedation  . Simvastatin Other (See Comments)    Myalgias   . Codeine Hives, Itching and Rash    Current Outpatient Prescriptions  Medication Sig  Dispense Refill  . anastrozole (ARIMIDEX) 1 MG tablet Take 1 tablet (1 mg total) by mouth daily. 90 tablet 3  . aspirin 81 MG tablet Take 81 mg by mouth daily.    Marland Kitchen atorvastatin (LIPITOR) 10 MG tablet Take 10 mg by mouth at bedtime.     Marland Kitchen BIOTIN PO Take by mouth.    . Calcium-Vitamin D-Vitamin K (VIACTIV) [phone] MG-UNT-MCG CHEW Chew 1 tablet by mouth.     . fluticasone (FLONASE) 50 MCG/ACT nasal spray Place 2 sprays into both nostrils daily as needed.     Marland Kitchen levothyroxine (SYNTHROID, LEVOTHROID) 50 MCG tablet Take 50 mcg by mouth daily before breakfast. Take 50 mcg 1 hours before breakfast.    . Nutritional Supplements (JUICE PLUS FIBRE) LIQD Take by mouth.    Marland Kitchen PARoxetine (PAXIL) 20 MG tablet Take 20 mg by mouth at bedtime.  No current facility-administered medications for this visit.    OBJECTIVE:  Middle-aged white woman who appears well  Filed Vitals:   10/03/15 1030  BP: 146/67  Pulse: 66  Temp: 98.7 F (37.1 C)  Resp: 18  Body mass index is 24.17 kg/(m^2).   ECOG:  0 Filed Weights   10/03/15 1030  Weight: 150 lb 14.4 oz (68.448 kg)   Sclerae unicteric, pupils round and equal Oropharynx clear and moist-- no thrush or other lesions No cervical or supraclavicular adenopathy Lungs no rales or rhonchi Heart regular rate and rhythm Abd soft, nontender, positive bowel sounds MSK no focal spinal tenderness, no upper extremity lymphedema Neuro: nonfocal, well oriented, appropriate affect Breasts: Status post bilateral mastectomies with saline implants in place. There is no evidence of local recurrence. Both axillae are benign.    LAB RESULTS:   Lab Results  Component Value Date   WBC 6.4 10/03/2015   NEUTROABS 3.6 10/03/2015   HGB 13.6 10/03/2015   HCT 40.7 10/03/2015   MCV 91.7 10/03/2015   PLT 259 10/03/2015      Chemistry      Component Value Date/Time   NA 138 10/03/2015 1018   NA 138 10/12/2013 0940   K 4.7 10/03/2015 1018   K 4.3 10/12/2013 0940    CL 102 10/12/2013 0940   CO2 26 10/03/2015 1018   CO2 27 10/12/2013 0940   BUN 21.4 10/03/2015 1018   BUN 18 10/12/2013 0940   CREATININE 0.9 10/03/2015 1018   CREATININE 0.91 10/12/2013 0940      Component Value Date/Time   CALCIUM 9.5 10/03/2015 1018   CALCIUM 9.7 10/12/2013 0940   ALKPHOS 65 10/03/2015 1018   AST 15 10/03/2015 1018   ALT 13 10/03/2015 1018   BILITOT 0.43 10/03/2015 1018      STUDIES: No results found.  ASSESSMENT: 64 y.o. BRCA negative [place] woman   (1)  status post right breast biopsy 09/21/2013 for a clinical T1c N0, stage IA invasive ductal carcinoma, grade 2, triple positive, with a HER-2: CEP 17 ratio of 5.55 and an average copy number per cell of  13.05. The MIB-1-1 was 25%  (2) status post bilateral mastectomies 10/13/2013 showing:  (a) on the left, atypical ductal hyperplasia  (b) on the right, a pT1c pN0, stage IA invasive ductal carcinoma, grade 2  (c) completed bilateral saline implant reconstruction 07/28/2014  (3) received paclitaxel/trastuzumab, with paclitaxel given on days 1, 8, and 15 and trastuzumab given every 2 weeks on days 1 and 15 of each 28 day cycle, for 4 cycles (12 doses of paclitaxel) completed 03/24/2014  (4) continued trastuzumab every three weeks through 12/01/2014   (a) echocardiogram 10/02/2014 showed a well preserved ejection fraction  (5) The patient participated in the [facility] study remotely, completing 5 years of tamoxifen in the late 1990s  (6) started anastrozole May 2015  (a) bone density 06/09/2014 showed a T score of -1.6 (decreased from prior)  (b) zolendronate started 03/21/2015, to be repeated yearly   PLAN: Barbara [name] is now 2 years out from her definitive surgery for her estrogen receptor positive breast cancer. There is no evidence of disease activity. This is very favorable.  We discussed her vitamin D situation in detail. She is concerned that she is only taking 1000 units a day and last time she did that  her blood level dropped to less than 30. I think she would do well to take 5000 units daily and that is what I wrote  for her. She is going to have this checked through Dr. Delene Ruffini office in December when she has her next lab appointment there.  I gave her information on our intimacy and pelvic health program. I think if she participates she will learn quite a bit and may be improve the vaginal dryness problem at least to some extent.  Otherwise she will return to see me in April. We will do lab work and zolendronate at that time and we will start yearly visits thereafter. The plan overall is to continue anastrozole for total of 5 years.  Chauncey Cruel, MD   10/03/2015 11:25 AM

## 2015-11-25 DIAGNOSIS — I639 Cerebral infarction, unspecified: Secondary | ICD-10-CM

## 2015-11-25 HISTORY — DX: Cerebral infarction, unspecified: I63.9

## 2015-12-24 ENCOUNTER — Emergency Department (HOSPITAL_COMMUNITY): Payer: PPO

## 2015-12-24 ENCOUNTER — Inpatient Hospital Stay (HOSPITAL_COMMUNITY)
Admission: EM | Admit: 2015-12-24 | Discharge: 2015-12-27 | DRG: 066 | Disposition: A | Discharge status: Home / Self Care | Payer: PPO | Attending: Neurology | Admitting: Neurology

## 2015-12-24 ENCOUNTER — Encounter (HOSPITAL_COMMUNITY): Payer: Self-pay | Admitting: *Deleted

## 2015-12-24 DIAGNOSIS — I611 Nontraumatic intracerebral hemorrhage in hemisphere, cortical: Principal | ICD-10-CM | POA: Diagnosis present

## 2015-12-24 DIAGNOSIS — I610 Nontraumatic intracerebral hemorrhage in hemisphere, subcortical: Secondary | ICD-10-CM | POA: Diagnosis not present

## 2015-12-24 DIAGNOSIS — I6523 Occlusion and stenosis of bilateral carotid arteries: Secondary | ICD-10-CM | POA: Diagnosis not present

## 2015-12-24 DIAGNOSIS — K219 Gastro-esophageal reflux disease without esophagitis: Secondary | ICD-10-CM | POA: Diagnosis not present

## 2015-12-24 DIAGNOSIS — Z9221 Personal history of antineoplastic chemotherapy: Secondary | ICD-10-CM | POA: Diagnosis not present

## 2015-12-24 DIAGNOSIS — E785 Hyperlipidemia, unspecified: Secondary | ICD-10-CM

## 2015-12-24 DIAGNOSIS — E039 Hypothyroidism, unspecified: Secondary | ICD-10-CM

## 2015-12-24 DIAGNOSIS — R1111 Vomiting without nausea: Secondary | ICD-10-CM | POA: Diagnosis not present

## 2015-12-24 DIAGNOSIS — I639 Cerebral infarction, unspecified: Secondary | ICD-10-CM

## 2015-12-24 DIAGNOSIS — I6789 Other cerebrovascular disease: Secondary | ICD-10-CM | POA: Diagnosis not present

## 2015-12-24 DIAGNOSIS — R4189 Other symptoms and signs involving cognitive functions and awareness: Secondary | ICD-10-CM | POA: Diagnosis not present

## 2015-12-24 DIAGNOSIS — I480 Paroxysmal atrial fibrillation: Secondary | ICD-10-CM | POA: Diagnosis present

## 2015-12-24 DIAGNOSIS — R0602 Shortness of breath: Secondary | ICD-10-CM | POA: Diagnosis not present

## 2015-12-24 DIAGNOSIS — I4891 Unspecified atrial fibrillation: Secondary | ICD-10-CM | POA: Diagnosis not present

## 2015-12-24 DIAGNOSIS — F419 Anxiety disorder, unspecified: Secondary | ICD-10-CM | POA: Diagnosis not present

## 2015-12-24 DIAGNOSIS — R9089 Other abnormal findings on diagnostic imaging of central nervous system: Secondary | ICD-10-CM

## 2015-12-24 DIAGNOSIS — R42 Dizziness and giddiness: Secondary | ICD-10-CM | POA: Diagnosis not present

## 2015-12-24 DIAGNOSIS — R930 Abnormal findings on diagnostic imaging of skull and head, not elsewhere classified: Secondary | ICD-10-CM | POA: Diagnosis not present

## 2015-12-24 DIAGNOSIS — I619 Nontraumatic intracerebral hemorrhage, unspecified: Secondary | ICD-10-CM

## 2015-12-24 DIAGNOSIS — I609 Nontraumatic subarachnoid hemorrhage, unspecified: Secondary | ICD-10-CM | POA: Diagnosis not present

## 2015-12-24 DIAGNOSIS — Z0000 Encounter for general adult medical examination without abnormal findings: Secondary | ICD-10-CM

## 2015-12-24 DIAGNOSIS — Z885 Allergy status to narcotic agent status: Secondary | ICD-10-CM

## 2015-12-24 DIAGNOSIS — E063 Autoimmune thyroiditis: Secondary | ICD-10-CM | POA: Diagnosis not present

## 2015-12-24 DIAGNOSIS — R404 Transient alteration of awareness: Secondary | ICD-10-CM | POA: Diagnosis not present

## 2015-12-24 DIAGNOSIS — Q760 Spina bifida occulta: Secondary | ICD-10-CM | POA: Diagnosis not present

## 2015-12-24 DIAGNOSIS — F411 Generalized anxiety disorder: Secondary | ICD-10-CM | POA: Diagnosis not present

## 2015-12-24 DIAGNOSIS — Z853 Personal history of malignant neoplasm of breast: Secondary | ICD-10-CM

## 2015-12-24 DIAGNOSIS — Z7982 Long term (current) use of aspirin: Secondary | ICD-10-CM | POA: Diagnosis not present

## 2015-12-24 DIAGNOSIS — Z888 Allergy status to other drugs, medicaments and biological substances status: Secondary | ICD-10-CM | POA: Diagnosis not present

## 2015-12-24 LAB — BASIC METABOLIC PANEL
Anion gap: 13 (ref 5–15)
BUN: 29 mg/dL — ABNORMAL HIGH (ref 6–20)
CO2: 23 mmol/L (ref 22–32)
Calcium: 10.1 mg/dL (ref 8.9–10.3)
Chloride: 103 mmol/L (ref 101–111)
Creatinine, Ser: 1.15 mg/dL — ABNORMAL HIGH (ref 0.44–1.00)
GFR calc Af Amer: 56 mL/min — ABNORMAL LOW (ref >60)
GFR calc non Af Amer: 48 mL/min — ABNORMAL LOW (ref >60)
Glucose, Bld: 155 mg/dL — ABNORMAL HIGH (ref 65–99)
Potassium: 3.6 mmol/L (ref 3.5–5.1)
Sodium: 139 mmol/L (ref 135–145)

## 2015-12-24 LAB — CBC
HCT: 42.7 % (ref 36.0–46.0)
Hemoglobin: 14.8 g/dL (ref 12.0–15.0)
MCH: 30.6 pg (ref 26.0–34.0)
MCHC: 34.7 g/dL (ref 30.0–36.0)
MCV: 88.4 fL (ref 78.0–100.0)
Platelets: 247 10*3/uL (ref 150–400)
RBC: 4.83 MIL/uL (ref 3.87–5.11)
RDW: 12.5 % (ref 11.5–15.5)
WBC: 9.7 10*3/uL (ref 4.0–10.5)

## 2015-12-24 LAB — TSH: TSH: 3.698 u[IU]/mL (ref 0.350–4.500)

## 2015-12-24 LAB — TROPONIN I: Troponin I: <0.03 ng/mL (ref <0.031)

## 2015-12-24 LAB — MAGNESIUM: Magnesium: 1.9 mg/dL (ref 1.7–2.4)

## 2015-12-24 MED ORDER — GADOBENATE DIMEGLUMINE 529 MG/ML IV SOLN
15.0000 mL | Freq: Once | INTRAVENOUS | Status: AC | PRN
  Administered 2015-12-24: 14 mL via INTRAVENOUS

## 2015-12-24 MED ORDER — LORAZEPAM 2 MG/ML IJ SOLN
0.5000 mg | Freq: Once | INTRAMUSCULAR | Status: AC
  Administered 2015-12-24: 0.5 mg via INTRAVENOUS
  Filled 2015-12-24: qty 1

## 2015-12-24 MED ORDER — PROMETHAZINE HCL 25 MG/ML IJ SOLN
12.5000 mg | Freq: Once | INTRAMUSCULAR | Status: AC
  Administered 2015-12-25: 12.5 mg via INTRAVENOUS
  Filled 2015-12-24 (×2): qty 1

## 2015-12-24 MED ORDER — DEXTROSE 5 % IV SOLN
INTRAVENOUS | Status: AC
  Filled 2015-12-24: qty 100

## 2015-12-24 MED ORDER — DILTIAZEM HCL 25 MG/5ML IV SOLN
20.0000 mg | Freq: Once | INTRAVENOUS | Status: AC
  Administered 2015-12-24: 20 mg via INTRAVENOUS
  Filled 2015-12-24: qty 5

## 2015-12-24 MED ORDER — DEXTROSE 5 % IV SOLN: 5.0000 mg/h | INTRAVENOUS | Status: DC

## 2015-12-24 MED ORDER — SODIUM CHLORIDE 0.9 % IV BOLUS (SEPSIS)
1000.0000 mL | Freq: Once | INTRAVENOUS | Status: AC
  Administered 2015-12-24: 1000 mL via INTRAVENOUS

## 2015-12-24 MED ORDER — ONDANSETRON HCL 4 MG/2ML IJ SOLN
4.0000 mg | Freq: Once | INTRAMUSCULAR | Status: AC
  Administered 2015-12-24: 4 mg via INTRAVENOUS
  Filled 2015-12-24: qty 2

## 2015-12-24 NOTE — ED Notes (Signed)
Patient transported to CT 

## 2015-12-24 NOTE — ED Notes (Signed)
Pt had episode of vomiting, hr noted to be in the 150-160's in afib. Pt then converted to a normal sinus rhythm with occasional pac's in the 80's. EDP notified of the same. Pt gown and linens changed, placed on bedpan per request.

## 2015-12-24 NOTE — ED Notes (Signed)
Bed: [id number] Expected date:  Expected time:  Means of arrival:  Comments: EMS 65yo N/V/D near syncope, tachy

## 2015-12-24 NOTE — ED Notes (Signed)
MD at bedside. 

## 2015-12-24 NOTE — ED Notes (Signed)
Pt states that she started feeling dizzy tonight and then started vomiting. Pt unsure if she has had diarrhea.

## 2015-12-24 NOTE — ED Notes (Signed)
Patient transported to MRI 

## 2015-12-24 NOTE — ED Notes (Signed)
Pt arrives via EMS from home. Pt had onset of n/v/d about 1 hour ago and experienced a near syncopal episode, to which her spouse assisted her to the floor. HR ST 140's supine for EMS, otherwise VSS. IV established en route.

## 2015-12-24 NOTE — ED Provider Notes (Signed)
CSN: [id number]     Arrival date & time 12/24/15  2008 History   First MD Initiated Contact with Patient 12/24/15 2012     Chief Complaint  Patient presents with  . Emesis  . Dizziness     (Consider location/radiation/quality/duration/timing/severity/associated sxs/prior Treatment) HPI   65 year old female with vertigo. Patient was sitting with her husband and neighbor having a conversation when she had acute onset of symptoms. She uses the word "vertigo" but denies a history of similar symptoms. She is a former [employer] scientist.  She describes sensation of room spinning. She felt very off balance and subsequently vomited. She has had continued symptoms since then. She denies any acute pain anywhere. No tinnitus. Feels better with her eyes closed. Was in her usual state of health earlier today.   Denies any recent trauma. No blood thinners. Hx of breast CA. Noted to be in afib on arrival. No prior history of it.   Past Medical History  Diagnosis Date  . Breast cancer ([place])   . Hashimoto's thyroiditis     with goiter  . Hypercholesteremia   . 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  Narrowing of the 4th and 5th lumbar interspaces.   Marland Kitchen Spina bifida ([place])     [place] L5  . Anxiety   . GERD (gastroesophageal reflux disease)   . Headache(784.0)     hx migraines  . Varicose vein    Past Surgical History  Procedure Laterality Date  . Hernia repair  1976    Left groin  . Tubal ligation    . Cystoscopy with urethral caruncle    . Mastectomy w/ sentinel node biopsy Bilateral 10/13/2013    Procedure:  BILATERAL MASTECTOMY WITH RIGHT SENTINEL LYMPH NODE BIOPSY;  Surgeon: Merrie Roof, MD;  Location: [place];  Service: General;  Laterality: Bilateral;  . Portacath placement Left 10/13/2013    Procedure: INSERTION PORT-A-CATH;  Surgeon: Merrie Roof, MD;  Location: [place];  Service: General;  Laterality: Left;  . Breast reconstruction with  placement of tissue expander and flex hd (acellular hydrated dermis) Bilateral 10/13/2013    Procedure: PLACEMENT OF BILATERAL TISSUE EXPANDER FOR BREAST RECONSTRUCTION ;  Surgeon: Crissie Reese, MD;  Location: [place];  Service: Plastics;  Laterality: Bilateral;  . Wisdom tooth extraction  1990  . Port-a-cath removal Left 12/21/2014    Procedure: MINOR REMOVAL OF PORT-A-CATH;  Surgeon: Autumn Messing III, MD;  Location: [place];  Service: General;  Laterality: Left;   Family History  Problem Relation Age of Onset  . Breast cancer Mother 25  . Lung cancer Father   . Breast cancer Sister 13    Bilateral breast cancer with 2nd dx at 77  . Breast cancer Maternal Aunt 52  . Breast cancer Sister 40  . Lung cancer Maternal Uncle   . Leukemia Paternal Uncle   . Melanoma Brother 52    on ear  . Prostate cancer Brother 58  . Cancer Cousin     maternal cousin with cancer - NOS   Social History  Substance Use Topics  . Smoking status: Never Smoker   . Smokeless tobacco: Never Used  . Alcohol Use: No   OB History    Gravida Para Term Preterm AB TAB SAB Ectopic Multiple Living   2 2        2      Review of Systems  All  systems reviewed and negative, other than as noted in HPI.   Allergies  Buprenex; Simvastatin; and Codeine  Home Medications   Prior to Admission medications   Medication Sig Start Date End Date Taking? Authorizing Provider  anastrozole (ARIMIDEX) 1 MG tablet Take 1 tablet (1 mg total) by mouth daily. 02/26/15   Chauncey Cruel, MD  aspirin 81 MG tablet Take 81 mg by mouth daily.    Historical Provider, MD  atorvastatin (LIPITOR) 10 MG tablet Take 10 mg by mouth at bedtime.     Kelton Pillar, MD  BIOTIN PO Take by mouth.    Historical Provider, MD  Calcium-Vitamin D-Vitamin K (VIACTIV) [id number] MG-UNT-MCG CHEW Chew 1 tablet by mouth.     Historical Provider, MD  cholecalciferol 5000 UNITS TABS Take 1 tablet (5,000 Units total) by mouth daily. 10/03/15    Chauncey Cruel, MD  fluticasone (FLONASE) 50 MCG/ACT nasal spray Place 2 sprays into both nostrils daily as needed.     Historical Provider, MD  levothyroxine (SYNTHROID, LEVOTHROID) 50 MCG tablet Take 50 mcg by mouth daily before breakfast. Take 50 mcg 1 hours before breakfast.    Kelton Pillar, MD  Nutritional Supplements (JUICE PLUS FIBRE) LIQD Take by mouth.    Historical Provider, MD  PARoxetine (PAXIL) 20 MG tablet Take 20 mg by mouth at bedtime.     Kelton Pillar, MD   BP 136/89 mmHg  Pulse 142  Temp(Src) 97.4 F (36.3 C) (Oral)  Resp 17  Ht 5\' 6"  (1.676 m)  Wt 157 lb (71.215 kg)  BMI 25.35 kg/m2  SpO2 100%  LMP 11/24/2004 Physical Exam  Constitutional:  Laying in bed on L side with eyes closed. Appears uncomfortable.   HENT:  Head: Normocephalic and atraumatic.  Eyes: Right eye exhibits no discharge. Left eye exhibits no discharge.  Neck: Neck supple.  Cardiovascular: Exam reveals no gallop and no friction rub.   No murmur heard. Tachycardic. Irregularly irregular.   Pulmonary/Chest: Effort normal and breath sounds normal. No respiratory distress.  Abdominal: Soft. She exhibits no distension. There is no tenderness.  Musculoskeletal: She exhibits no edema or tenderness.  Neurological: She is alert.  Difficult exam in that pt is very symptomatic in terms or vertigo/nausea and could only stand to keep her eyes open briefly. Has rotary nystagmus. Having difficulty with following along EOM testing, they and other CN seem intact though. Strength is 5/5 b/l u/l extremities. Could not follow along to assess finger to nose or heel to shin. Did not try to ambulate.   Skin: Skin is warm and dry.  Psychiatric: She has a normal mood and affect. Her behavior is normal. Thought content normal.  Nursing note and vitals reviewed.   ED Course  Procedures (including critical care time)  CRITICAL CARE Performed by: Virgel Manifold Total critical care time: 35 minutes Critical care  time was exclusive of separately billable procedures and treating other patients. Critical care was necessary to treat or prevent imminent or life-threatening deterioration. Critical care was time spent personally by me on the following activities: development of treatment plan with patient and/or surrogate as well as nursing, discussions with consultants, evaluation of patient's response to treatment, examination of patient, obtaining history from patient or surrogate, ordering and performing treatments and interventions, ordering and review of laboratory studies, ordering and review of radiographic studies, pulse oximetry and re-evaluation of patient's condition.  Labs Review Labs Reviewed  BASIC METABOLIC PANEL - Abnormal; Notable for the following:  Glucose, Bld 155 (*)    BUN 29 (*)    Creatinine, Ser 1.15 (*)    GFR calc non Af Amer 48 (*)    GFR calc Af Amer 56 (*)    All other components within normal limits  CBC  TSH  MAGNESIUM  TROPONIN I  PROTIME-INR  URINALYSIS, ROUTINE W REFLEX MICROSCOPIC (NOT AT [facility])    Imaging Review Ct Head Wo Contrast  12/24/2015  CLINICAL DATA:  Sudden onset of dizziness with nausea and vomiting tonight. History of breast cancer. Concern for cerebellar stroke. EXAM: CT HEAD WITHOUT CONTRAST TECHNIQUE: Contiguous axial images were obtained from the base of the skull through the vertex without intravenous contrast. COMPARISON:  MRI brain 01/16/2015 and 10/06/2014. FINDINGS: There is a new 1.3 cm focus of acute hemorrhage near the gray-white junction of the right occipital lobe (image number 20). There appears to be some new cortical thickening in the left frontal lobe on images 15 through 18. There is no extra-axial fluid collection or hydrocephalus. The cerebellum appears normal. The visualized paranasal sinuses, mastoid air cells and middle ears are clear. The calvarium is intact. There are no worrisome osseous findings. IMPRESSION: 1. Focus of acute  hemorrhage in the right occipital lobe near the gray-white junction. This could reflect a hemorrhagic metastasis or hemorrhage from amyloid angiopathy. There is no history of trauma or anti coagulation. 2. Possible cortical thickening in the left frontal lobe suggesting possible cerebritis. 3. MRI without and with contrast recommended for further evaluation. 4. Critical Value/emergent results were called by telephone at the time of interpretation on 12/24/2015 at 9:40 pm to Dr. Virgel Manifold , who verbally acknowledged these results. Electronically Signed   By: Richardean Sale M.D.   On: 12/24/2015 21:43   Mr Jeri Cos [id number] Contrast  12/24/2015  CLINICAL DATA:  Initial evaluation for sudden onset dizziness. EXAM: MRI HEAD WITHOUT AND WITH CONTRAST TECHNIQUE: Multiplanar, multiecho pulse sequences of the brain and surrounding structures were obtained without and with intravenous contrast. CONTRAST:  8mL MULTIHANCE GADOBENATE DIMEGLUMINE 529 MG/ML IV SOLN COMPARISON:  Prior CT from earlier the same day. FINDINGS: Study somewhat degraded by motion artifact. Cerebral volume within normal limits for patient age. Patchy T2/FLAIR hyperintensity within the periventricular and deep white matter both cerebral hemispheres most consistent with chronic small vessel ischemic disease, mild in nature. Previously identified parenchymal hemorrhage again seen within the right occipital lobe. This measures 14 mm on this exam without significant mass effect. No underlying abnormal enhancement on post gadolinium sequence. Serpiginous FLAIR hyperintensity with layering within multiple cortical sulci of the left frontal lobe with associated scattered hypointense signal intensity on gradient echo sequence, most consistent with acute subarachnoid hemorrhage. There is mildly prominent asymmetric leptomeningeal enhancement within this region, likely reactive. Minimal gyral swelling without significant cortical edema or changes to suggest acute  infection. No evidence for acute ischemic infarct. Major intracranial vascular flow voids are maintained. Gray-white matter differentiation otherwise preserved. Few scattered foci of susceptibility artifact within the bilateral cerebral hemispheres present, nonspecific, but like related to small chronic underlying micro hemorrhages. No mass effect or midline shift. No hydrocephalus. No mass lesion. Other than the associated leptomeningeal enhancement within the left frontal lobe. No other abnormal enhancement identified. Major dural sinuses appear patent. Craniocervical junction within normal limits. Mild degenerative spondylolysis within the upper cervical spine without significant stenosis. Pituitary gland normal.  No acute abnormality about the orbits. Retention cyst within the left maxillary sinus. Scattered mucosal thickening and mild  opacity within the ethmoidal air cells. No mastoid effusion.  Inner ear structures grossly normal. Bone marrow signal intensity within normal limits. No scalp soft tissue abnormality. IMPRESSION: 1. 14 mm acute parenchymal hemorrhage within the right occipital lobe without significant mass effect. No underlying lesion identified. 2. Scattered acute subarachnoid hemorrhage within the left frontal lobe. Etiology of the right occipital and left frontal hemorrhage is uncertain. Primary differential considerations include possible occult trauma or underlying coagulopathy. Hemorrhage related to underlying hypertension could also be considered. Presence of subarachnoid blood is not typical of underlying amyloid angiopathy. The major dural sinuses appear patent. 3. Scattered chronic micro hemorrhages involving both cerebral hemispheres, nonspecific, but most commonly related to chronic underlying hypertension. 4. Mild chronic small vessel ischemic disease. Electronically Signed   By: Jeannine Boga M.D.   On: 12/24/2015 23:18   I have personally reviewed and evaluated these images  and lab results as part of my medical decision-making.   EKG Interpretation   Date/Time:  Monday December 24 2015 20:22:04 EST Ventricular Rate:  141 PR Interval:    QRS Duration: 82 QT Interval:  320 QTC Calculation: 490 R Axis:   83 Text Interpretation:  Atrial fibrillation Borderline right axis deviation  Anteroseptal infarct, old Repol abnrm suggests ischemia, diffuse leads  Minimal ST elevation, lateral leads Confirmed by Wilson Singer  MD, Aleck Locklin ([id number])  on 12/24/2015 9:24:19 PM      MDM   Final diagnoses:  Abnormal CT of brain  Hemorrhagic stroke (H[facility])  SAH (subarachnoid hemorrhage) (H[facility])  New onset a-fib ([place])    65 year old female with vertigo. Some concern for possible cerebellar stroke. Very symptomatic regardless of position. New onset atrial fibrillation. Review of prior imaging shows previous cerebellar infarct. She denies any CP or dyspnea. Will treat her symptoms. Cardizem for rate control. CT. Clinical concern high enough that will MR if CT negative.   Unfortunately, CT as above. Does have hx of breast CA. Will MR.    11:18 PM Pt vomited and converted to sinus when retching.   Discussed with Dr Arnoldo Morale, neurosurgery. No acute surgical intervention. Recommending discussion with neurology service. Discussed with Dr Nicole Kindred, neurology. Transfer to [facility].     Virgel Manifold, MD 12/25/15 [phone]

## 2015-12-25 ENCOUNTER — Inpatient Hospital Stay (HOSPITAL_COMMUNITY): Payer: PPO

## 2015-12-25 ENCOUNTER — Encounter (HOSPITAL_COMMUNITY): Payer: Self-pay | Admitting: Radiology

## 2015-12-25 DIAGNOSIS — E785 Hyperlipidemia, unspecified: Secondary | ICD-10-CM

## 2015-12-25 DIAGNOSIS — I619 Nontraumatic intracerebral hemorrhage, unspecified: Secondary | ICD-10-CM | POA: Diagnosis not present

## 2015-12-25 DIAGNOSIS — I611 Nontraumatic intracerebral hemorrhage in hemisphere, cortical: Principal | ICD-10-CM

## 2015-12-25 DIAGNOSIS — F419 Anxiety disorder, unspecified: Secondary | ICD-10-CM | POA: Diagnosis not present

## 2015-12-25 DIAGNOSIS — R42 Dizziness and giddiness: Secondary | ICD-10-CM

## 2015-12-25 DIAGNOSIS — I639 Cerebral infarction, unspecified: Secondary | ICD-10-CM | POA: Diagnosis not present

## 2015-12-25 DIAGNOSIS — F411 Generalized anxiety disorder: Secondary | ICD-10-CM

## 2015-12-25 DIAGNOSIS — I6789 Other cerebrovascular disease: Secondary | ICD-10-CM | POA: Diagnosis not present

## 2015-12-25 DIAGNOSIS — I610 Nontraumatic intracerebral hemorrhage in hemisphere, subcortical: Secondary | ICD-10-CM | POA: Diagnosis not present

## 2015-12-25 DIAGNOSIS — Z9221 Personal history of antineoplastic chemotherapy: Secondary | ICD-10-CM | POA: Diagnosis not present

## 2015-12-25 DIAGNOSIS — Z7982 Long term (current) use of aspirin: Secondary | ICD-10-CM | POA: Diagnosis not present

## 2015-12-25 DIAGNOSIS — Z888 Allergy status to other drugs, medicaments and biological substances status: Secondary | ICD-10-CM | POA: Diagnosis not present

## 2015-12-25 DIAGNOSIS — R1111 Vomiting without nausea: Secondary | ICD-10-CM | POA: Diagnosis not present

## 2015-12-25 DIAGNOSIS — I609 Nontraumatic subarachnoid hemorrhage, unspecified: Secondary | ICD-10-CM

## 2015-12-25 DIAGNOSIS — R4189 Other symptoms and signs involving cognitive functions and awareness: Secondary | ICD-10-CM | POA: Diagnosis not present

## 2015-12-25 DIAGNOSIS — I4891 Unspecified atrial fibrillation: Secondary | ICD-10-CM | POA: Diagnosis not present

## 2015-12-25 DIAGNOSIS — R0602 Shortness of breath: Secondary | ICD-10-CM | POA: Diagnosis not present

## 2015-12-25 DIAGNOSIS — I480 Paroxysmal atrial fibrillation: Secondary | ICD-10-CM | POA: Diagnosis not present

## 2015-12-25 DIAGNOSIS — Z853 Personal history of malignant neoplasm of breast: Secondary | ICD-10-CM | POA: Diagnosis not present

## 2015-12-25 DIAGNOSIS — K219 Gastro-esophageal reflux disease without esophagitis: Secondary | ICD-10-CM | POA: Diagnosis not present

## 2015-12-25 DIAGNOSIS — Q760 Spina bifida occulta: Secondary | ICD-10-CM | POA: Diagnosis not present

## 2015-12-25 DIAGNOSIS — I6523 Occlusion and stenosis of bilateral carotid arteries: Secondary | ICD-10-CM | POA: Diagnosis not present

## 2015-12-25 DIAGNOSIS — R404 Transient alteration of awareness: Secondary | ICD-10-CM | POA: Diagnosis not present

## 2015-12-25 DIAGNOSIS — E063 Autoimmune thyroiditis: Secondary | ICD-10-CM | POA: Diagnosis not present

## 2015-12-25 DIAGNOSIS — Z885 Allergy status to narcotic agent status: Secondary | ICD-10-CM | POA: Diagnosis not present

## 2015-12-25 LAB — PROTIME-INR
INR: 0.97 (ref 0.00–1.49)
Prothrombin Time: 12.7 seconds (ref 11.6–15.2)

## 2015-12-25 LAB — URINALYSIS, ROUTINE W REFLEX MICROSCOPIC
Bilirubin Urine: NEGATIVE
Glucose, UA: NEGATIVE mg/dL
Ketones, ur: 15 mg/dL — AB
Leukocytes, UA: NEGATIVE
Nitrite: NEGATIVE
Protein, ur: NEGATIVE mg/dL
Specific Gravity, Urine: 1.015 (ref 1.005–1.030)
pH: 7 (ref 5.0–8.0)

## 2015-12-25 LAB — URINE MICROSCOPIC-ADD ON

## 2015-12-25 LAB — MRSA PCR SCREENING: MRSA BY PCR: NEGATIVE

## 2015-12-25 MED ORDER — PANTOPRAZOLE SODIUM 40 MG PO TBEC
40.0000 mg | DELAYED_RELEASE_TABLET | Freq: Every day | ORAL | Status: DC
  Administered 2015-12-25 – 2015-12-27 (×3): 40 mg via ORAL
  Filled 2015-12-25 (×3): qty 1

## 2015-12-25 MED ORDER — LEVOTHYROXINE SODIUM 50 MCG PO TABS
50.0000 ug | ORAL_TABLET | Freq: Every day | ORAL | Status: DC
  Administered 2015-12-26 – 2015-12-27 (×2): 50 ug via ORAL
  Filled 2015-12-25 (×2): qty 1

## 2015-12-25 MED ORDER — IOHEXOL 350 MG/ML SOLN
50.0000 mL | Freq: Once | INTRAVENOUS | Status: AC | PRN
  Administered 2015-12-25: 50 mL via INTRAVENOUS

## 2015-12-25 MED ORDER — ANASTROZOLE 1 MG PO TABS
1.0000 mg | ORAL_TABLET | Freq: Every day | ORAL | Status: DC
  Administered 2015-12-25 – 2015-12-27 (×3): 1 mg via ORAL
  Filled 2015-12-25 (×3): qty 1

## 2015-12-25 MED ORDER — LABETALOL HCL 5 MG/ML IV SOLN: 10.0000 mg | INTRAVENOUS | Status: DC | PRN

## 2015-12-25 MED ORDER — ATORVASTATIN CALCIUM 10 MG PO TABS
10.0000 mg | ORAL_TABLET | Freq: Every day | ORAL | Status: DC
  Administered 2015-12-25 – 2015-12-26 (×2): 10 mg via ORAL
  Filled 2015-12-25 (×2): qty 1

## 2015-12-25 MED ORDER — PANTOPRAZOLE SODIUM 40 MG IV SOLR
40.0000 mg | Freq: Every day | INTRAVENOUS | Status: DC
  Administered 2015-12-25: 40 mg via INTRAVENOUS
  Filled 2015-12-25: qty 40

## 2015-12-25 MED ORDER — ONDANSETRON HCL 4 MG/2ML IJ SOLN: 4.0000 mg | Freq: Three times a day (TID) | INTRAMUSCULAR | Status: AC | PRN

## 2015-12-25 MED ORDER — STROKE: EARLY STAGES OF RECOVERY BOOK
Freq: Once | Status: AC
  Administered 2015-12-25: 09:00:00
  Filled 2015-12-25 (×2): qty 1

## 2015-12-25 MED ORDER — ONDANSETRON HCL 4 MG/2ML IJ SOLN
4.0000 mg | Freq: Three times a day (TID) | INTRAMUSCULAR | Status: AC | PRN
  Administered 2015-12-25: 4 mg via INTRAVENOUS
  Filled 2015-12-25: qty 2

## 2015-12-25 MED ORDER — SENNOSIDES-DOCUSATE SODIUM 8.6-50 MG PO TABS
1.0000 | ORAL_TABLET | Freq: Two times a day (BID) | ORAL | Status: DC
  Administered 2015-12-25 – 2015-12-26 (×4): 1 via ORAL
  Filled 2015-12-25 (×5): qty 1

## 2015-12-25 MED ORDER — ACETAMINOPHEN 325 MG PO TABS: 650.0000 mg | ORAL_TABLET | ORAL | Status: DC | PRN

## 2015-12-25 MED ORDER — ACETAMINOPHEN 650 MG RE SUPP: 650.0000 mg | RECTAL | Status: DC | PRN

## 2015-12-25 MED ORDER — PAROXETINE HCL 20 MG PO TABS
20.0000 mg | ORAL_TABLET | Freq: Every day | ORAL | Status: DC
  Administered 2015-12-25 – 2015-12-26 (×2): 20 mg via ORAL
  Filled 2015-12-25 (×2): qty 1

## 2015-12-25 MED ORDER — SODIUM CHLORIDE 0.9 % IV SOLN
INTRAVENOUS | Status: DC
  Administered 2015-12-25: 03:00:00 via INTRAVENOUS

## 2015-12-25 NOTE — Care Management Note (Signed)
Case Management Note  Patient Details  Name: Barbara [name] MRN: [id number] Date of Birth: 07/28/1951  Subjective/Objective:    Pt admitted on 12/24/15 with [facility].  PTA, pt independent, lives with spouse.                  Action/Plan: Will follow for discharge planning as pt progresses.    Expected Discharge Date:                  Expected Discharge Plan:  [place]  In-House Referral:     Discharge planning Services  CM Consult  Post Acute Care Choice:    Choice offered to:     DME Arranged:    DME Agency:     HH Arranged:    [place] Agency:     Status of Service:  In process, will continue to follow  Medicare Important Message Given:    Date Medicare IM Given:    Medicare IM give by:    Date Additional Medicare IM Given:    Additional Medicare Important Message give by:     If discussed at [place] of Stay Meetings, dates discussed:    Additional Comments:  Reinaldo Raddle, RN, BSN  Trauma/Neuro ICU Case Manager [phone]

## 2015-12-25 NOTE — ED Notes (Signed)
Neuro MD at bedside

## 2015-12-25 NOTE — H&P (Signed)
Admission H&P    Chief Complaint: Acute onset of vertigo and nausea.  HPI: Barbara [name] is an 65 y.o. female with a history of breast cancer, Hashimoto's thyroiditis, hypercholesterolemia and stroke in 2015, brought to the emergency room following acute onset of severe vertigo with nausea. CT scan of her head was obtained which showed acute right occipital intracerebral hemorrhage. MRI of the brain also showed right occipital hemorrhage in addition to left frontal subarachnoid hemorrhage. She's been taking aspirin 81 mg per day. No head trauma occurred according to husband. Patient also is in atrial fibrillation on presentation, which had not previously been documented. She subsequently reverted to sinus rhythm. NIH stroke score was 0 at the time of this evaluation.  LSN: 7:00 PM on 12/24/2015 tPA Given: No: Acute hemorrhage mRankin:  Past Medical History  Diagnosis Date  . Breast cancer ([place])   . Hashimoto's thyroiditis     with goiter  . Hypercholesteremia   . 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  Narrowing of the 4th and 5th lumbar interspaces.   Marland Kitchen Spina bifida ([place])     [place]ta L5  . Anxiety   . GERD (gastroesophageal reflux disease)   . Headache(784.0)     hx migraines  . Varicose vein     Past Surgical History  Procedure Laterality Date  . Hernia repair  1976    Left groin  . Tubal ligation    . Cystoscopy with urethral caruncle    . Mastectomy w/ sentinel node biopsy Bilateral 10/13/2013    Procedure:  BILATERAL MASTECTOMY WITH RIGHT SENTINEL LYMPH NODE BIOPSY;  Surgeon: Merrie Roof, MD;  Location: [facility];  Service: General;  Laterality: Bilateral;  . Portacath placement Left 10/13/2013    Procedure: INSERTION PORT-A-CATH;  Surgeon: Merrie Roof, MD;  Location: [facility];  Service: General;  Laterality: Left;  . Breast reconstruction with placement of tissue expander and flex hd (acellular hydrated dermis)  Bilateral 10/13/2013    Procedure: PLACEMENT OF BILATERAL TISSUE EXPANDER FOR BREAST RECONSTRUCTION ;  Surgeon: Crissie Reese, MD;  Location: [facility];  Service: Plastics;  Laterality: Bilateral;  . Wisdom tooth extraction  1990  . Port-a-cath removal Left 12/21/2014    Procedure: MINOR REMOVAL OF PORT-A-CATH;  Surgeon: Autumn Messing III, MD;  Location: [facility];  Service: General;  Laterality: Left;    Family History  Problem Relation Age of Onset  . Breast cancer Mother 55  . Lung cancer Father   . Breast cancer Sister 45    Bilateral breast cancer with 2nd dx at 33  . Breast cancer Maternal Aunt 52  . Breast cancer Sister 32  . Lung cancer Maternal Uncle   . Leukemia Paternal Uncle   . Melanoma Brother 52    on ear  . Prostate cancer Brother 62  . Cancer Cousin     maternal cousin with cancer - NOS   Social History:  reports that she has never smoked. She has never used smokeless tobacco. She reports that she does not drink alcohol or use illicit drugs.  Allergies:  Allergies  Allergen Reactions  . Buprenex [Buprenorphine] Nausea And Vomiting and Other (See Comments)    Oversedation  . Simvastatin Other (See Comments)    Myalgias   . Codeine Hives, Itching and Rash    Medications: Patient's preadmission medications were reviewed by me.  ROS: History obtained from spouse  General ROS: negative for - chills, fatigue, fever, night sweats, weight gain or weight loss Psychological ROS: negative for - behavioral disorder, hallucinations, memory difficulties, mood swings or suicidal ideation Ophthalmic ROS: negative for - blurry vision, double vision, eye pain or loss of vision ENT ROS: negative for - epistaxis, nasal discharge, oral lesions, sore throat, tinnitus or vertigo Allergy and Immunology ROS: negative for - hives or itchy/watery eyes Hematological and Lymphatic ROS: negative for - bleeding problems, bruising or swollen lymph nodes Endocrine ROS: negative  for - galactorrhea, hair pattern changes, polydipsia/polyuria or temperature intolerance Respiratory ROS: negative for - cough, hemoptysis, shortness of breath or wheezing Cardiovascular ROS: negative for - chest pain, dyspnea on exertion, edema or irregular heartbeat Gastrointestinal ROS: negative for - abdominal pain, diarrhea, hematemesis, nausea/vomiting or stool incontinence Genito-Urinary ROS: negative for - dysuria, hematuria, incontinence or urinary frequency/urgency Musculoskeletal ROS: negative for - joint swelling or muscular weakness Neurological ROS: as noted in HPI Dermatological ROS: negative for rash and skin lesion changes  Physical Examination: Blood pressure 127/75, pulse 85, temperature 97.5 F (36.4 C), temperature source Oral, resp. rate 20, height 5' 6"  (1.676 m), weight 71.215 kg (157 lb), last menstrual period 11/24/2004, SpO2 100 %.  HEENT-  Normocephalic, no lesions, without obvious abnormality.  Normal external eye and conjunctiva.  Normal TM's bilaterally.  Normal auditory canals and external ears. Normal external nose, mucus membranes and septum.  Normal pharynx. Neck supple with no masses, nodes, nodules or enlargement. Cardiovascular - tachycardic with regular rhythm, normal S1 and S2, no murmur or gallop Lungs - chest clear, no wheezing, rales, normal symmetric air entry Abdomen - soft, non-tender; bowel sounds normal; no masses,  no organomegaly Extremities - no joint deformities, effusion, or inflammation and no edema  Neurologic Examination: Mental Status: Somnolent, arousable, but readily falls asleep. Able to follow simple commands. Cranial Nerves: II-Visual fields were normal to confrontation. III/IV/VI-Pupils were equal and reacted normally to light. Extraocular movements were full and conjugate.    VII- no facial weakness. VIII-normal. Anselmo Pickler was slurred but commensurate with level of alertness t. XI: trapezius strength/neck flexion strength  normal bilaterally XII-midline tongue extension with normal strength. Motor: 5/5 bilaterally with normal tone and bulk Sensory: Normal throughout. Deep Tendon Reflexes: 2+ and symmetric. Plantars: Flexor bilaterally Carotid auscultation: Normal  Results for orders placed or performed during the hospital encounter of 12/24/15 (from the past 48 hour(s))  Basic metabolic panel     Status: Abnormal   Collection Time: 12/24/15  8:42 PM  Result Value Ref Range   Sodium 139 135 - 145 mmol/L   Potassium 3.6 3.5 - 5.1 mmol/L   Chloride 103 101 - 111 mmol/L   CO2 23 22 - 32 mmol/L   Glucose, Bld 155 (H) 65 - 99 mg/dL   BUN 29 (H) 6 - 20 mg/dL   Creatinine, Ser 1.15 (H) 0.44 - 1.00 mg/dL   Calcium 10.1 8.9 - 10.3 mg/dL   GFR calc non Af Amer 48 (L) >60 mL/min   GFR calc Af Amer 56 (L) >60 mL/min    Comment: (NOTE) The eGFR has been calculated using the CKD EPI equation. This calculation has not been validated in all clinical situations. eGFR's persistently <60 mL/min signify possible Chronic Kidney Disease.    Anion gap 13 5 - 15  CBC     Status: None   Collection Time: 12/24/15  8:42 PM  Result Value Ref Range   WBC 9.7 4.0 - 10.5 K/uL  RBC 4.83 3.87 - 5.11 MIL/uL   Hemoglobin 14.8 12.0 - 15.0 g/dL   HCT 42.7 36.0 - 46.0 %   MCV 88.4 78.0 - 100.0 fL   MCH 30.6 26.0 - 34.0 pg   MCHC 34.7 30.0 - 36.0 g/dL   RDW 12.5 11.5 - 15.5 %   Platelets 247 150 - 400 K/uL  Magnesium     Status: None   Collection Time: 12/24/15  8:42 PM  Result Value Ref Range   Magnesium 1.9 1.7 - 2.4 mg/dL  Troponin I     Status: None   Collection Time: 12/24/15  8:42 PM  Result Value Ref Range   Troponin I <0.03 <0.031 ng/mL    Comment:        NO INDICATION OF MYOCARDIAL INJURY.   Protime-INR     Status: None   Collection Time: 12/24/15  8:44 PM  Result Value Ref Range   Prothrombin Time 12.7 11.6 - 15.2 seconds   INR 0.97 0.00 - 1.49  TSH     Status: None   Collection Time: 12/24/15  9:04 PM   Result Value Ref Range   TSH 3.698 0.350 - 4.500 uIU/mL   Ct Head Wo Contrast  12/24/2015  CLINICAL DATA:  Sudden onset of dizziness with nausea and vomiting tonight. History of breast cancer. Concern for cerebellar stroke. EXAM: CT HEAD WITHOUT CONTRAST TECHNIQUE: Contiguous axial images were obtained from the base of the skull through the vertex without intravenous contrast. COMPARISON:  MRI brain 01/16/2015 and 10/06/2014. FINDINGS: There is a new 1.3 cm focus of acute hemorrhage near the gray-white junction of the right occipital lobe (image number 20). There appears to be some new cortical thickening in the left frontal lobe on images 15 through 18. There is no extra-axial fluid collection or hydrocephalus. The cerebellum appears normal. The visualized paranasal sinuses, mastoid air cells and middle ears are clear. The calvarium is intact. There are no worrisome osseous findings. IMPRESSION: 1. Focus of acute hemorrhage in the right occipital lobe near the gray-white junction. This could reflect a hemorrhagic metastasis or hemorrhage from amyloid angiopathy. There is no history of trauma or anti coagulation. 2. Possible cortical thickening in the left frontal lobe suggesting possible cerebritis. 3. MRI without and with contrast recommended for further evaluation. 4. Critical Value/emergent results were called by telephone at the time of interpretation on 12/24/2015 at 9:40 pm to Dr. Virgel Manifold , who verbally acknowledged these results. Electronically Signed   By: Richardean Sale M.D.   On: 12/24/2015 21:43   Mr Jeri Cos LO Contrast  12/24/2015  CLINICAL DATA:  Initial evaluation for sudden onset dizziness. EXAM: MRI HEAD WITHOUT AND WITH CONTRAST TECHNIQUE: Multiplanar, multiecho pulse sequences of the brain and surrounding structures were obtained without and with intravenous contrast. CONTRAST:  72m MULTIHANCE GADOBENATE DIMEGLUMINE 529 MG/ML IV SOLN COMPARISON:  Prior CT from earlier the same day.  FINDINGS: Study somewhat degraded by motion artifact. Cerebral volume within normal limits for patient age. Patchy T2/FLAIR hyperintensity within the periventricular and deep white matter both cerebral hemispheres most consistent with chronic small vessel ischemic disease, mild in nature. Previously identified parenchymal hemorrhage again seen within the right occipital lobe. This measures 14 mm on this exam without significant mass effect. No underlying abnormal enhancement on post gadolinium sequence. Serpiginous FLAIR hyperintensity with layering within multiple cortical sulci of the left frontal lobe with associated scattered hypointense signal intensity on gradient echo sequence, most consistent with acute subarachnoid hemorrhage.  There is mildly prominent asymmetric leptomeningeal enhancement within this region, likely reactive. Minimal gyral swelling without significant cortical edema or changes to suggest acute infection. No evidence for acute ischemic infarct. Major intracranial vascular flow voids are maintained. Gray-white matter differentiation otherwise preserved. Few scattered foci of susceptibility artifact within the bilateral cerebral hemispheres present, nonspecific, but like related to small chronic underlying micro hemorrhages. No mass effect or midline shift. No hydrocephalus. No mass lesion. Other than the associated leptomeningeal enhancement within the left frontal lobe. No other abnormal enhancement identified. Major dural sinuses appear patent. Craniocervical junction within normal limits. Mild degenerative spondylolysis within the upper cervical spine without significant stenosis. Pituitary gland normal.  No acute abnormality about the orbits. Retention cyst within the left maxillary sinus. Scattered mucosal thickening and mild opacity within the ethmoidal air cells. No mastoid effusion.  Inner ear structures grossly normal. Bone marrow signal intensity within normal limits. No scalp soft  tissue abnormality. IMPRESSION: 1. 14 mm acute parenchymal hemorrhage within the right occipital lobe without significant mass effect. No underlying lesion identified. 2. Scattered acute subarachnoid hemorrhage within the left frontal lobe. Etiology of the right occipital and left frontal hemorrhage is uncertain. Primary differential considerations include possible occult trauma or underlying coagulopathy. Hemorrhage related to underlying hypertension could also be considered. Presence of subarachnoid blood is not typical of underlying amyloid angiopathy. The major dural sinuses appear patent. 3. Scattered chronic micro hemorrhages involving both cerebral hemispheres, nonspecific, but most commonly related to chronic underlying hypertension. 4. Mild chronic small vessel ischemic disease. Electronically Signed   By: Jeannine Boga M.D.   On: 12/24/2015 23:18    Assessment: 65 y.o. female with multiple risk factors for stroke as well as a history of previous stroke presenting with acute right occipital intracerebral hematoma, as well as an area of subarachnoid bleeding involving the frontal region on the left. Patient is also presenting with new onset intermittent atrial fibrillation.  Stroke Risk Factors - family history and hyperlipidemia  Plan: 1. Neuro intensive care unit overnight for close monitoring 2. MRA  of the brain without contrast 3. PT consult, OT consult, Speech consult 4. Echocardiogram 5. Carotid dopplers 6. Prophylactic therapy-None 7. Risk factor modification 8. HgbA1c, fasting lipid panel  This patient is critically ill and at significant risk of neurological worsening or death, and care requires constant monitoring of vital signs, hemodynamics,respiratory and cardiac monitoring, neurological assessment, discussion with family, other specialists and medical decision making of high complexity. Total critical care time was 60 minutes.  C.R. Nicole Kindred, MD [facility] [phone]  12/25/2015, 12:37 AM

## 2015-12-25 NOTE — Progress Notes (Addendum)
STROKE TEAM PROGRESS NOTE   HISTORY OF PRESENT ILLNESS Barbara [name] is an 65 y.o. female with a history of breast cancer, Hashimoto's thyroiditis, hypercholesterolemia and stroke in 2015, brought to the emergency room following acute onset of severe vertigo with nausea at 7pm on 12/24/2015. CT scan of her head was obtained which showed acute right occipital intracerebral hemorrhage. MRI of the brain also showed right occipital hemorrhage in addition to left frontal subarachnoid hemorrhage. She's been taking aspirin 81 mg per day. No head trauma occurred according to husband. Patient also is in atrial fibrillation on presentation, which had not previously been documented. She subsequently reverted to sinus rhythm. NIH stroke score was 0 at the time of this evaluation.  Patient was not administered TPA secondary to hemorrhage. She was transferred to [facility] and admitted to the neuro ICU for further evaluation and treatment.   SUBJECTIVE (INTERVAL HISTORY) Her husband and daughter are at the bedside.  Overall she feels her condition is stable. She stated that she had a sudden onset vertigo and nausea vomiting while she was watching TV and laughing. Never had a similar episode before. Denies any history of brain hemorrhage in the past. Had the breast cancer, no recurrence, on anastrozole. Pt still has once a while difficulty with word finding but better than before, denies depression.  She followed with Dr. Leta Baptist in [facility] one year ago for increasing word finding difficulties, trouble concentration, trouble remembering specific words. MRI in November 2015 showed small punctate acute infarct in the left cerebellum versus demyelinating disease. She was put on aspirin 81. Repeat MRI in February 2016 showed no acute abnormality with several scattered punctate foci of periventricular and subcortical nonspecific T2 hyperintensities. On follow-up in March 2016 with Dr. Leta Baptist, pt was doing better with memory  issues after finishing off chemotherapy.   OBJECTIVE Temp:  [97.4 F (36.3 C)-98.6 F (37 C)] 98.6 F (37 C) (01/31 1200) Pulse Rate:  [62-142] 64 (01/31 1100) Cardiac Rhythm:  [-] Normal sinus rhythm (01/31 0800) Resp:  [10-29] 10 (01/31 1100) BP: (93-148)/(39-93) 117/64 mmHg (01/31 1100) SpO2:  [90 %-100 %] 100 % (01/31 1100) Weight:  [152 lb 1.9 oz (69 kg)-157 lb (71.215 kg)] 152 lb 1.9 oz (69 kg) (01/31 0147)  CBC:   Recent Labs Lab 12/24/15 2042  WBC 9.7  HGB 14.8  HCT 42.7  MCV 88.4  PLT [id number]    Basic Metabolic Panel:   Recent Labs Lab 12/24/15 2042  NA 139  K 3.6  CL 103  CO2 23  GLUCOSE 155*  BUN 29*  CREATININE 1.15*  CALCIUM 10.1  MG 1.9    Lipid Panel: No results found for: CHOL, TRIG, HDL, CHOLHDL, VLDL, LDLCALC HgbA1c: No results found for: HGBA1C Urine Drug Screen: No results found for: LABOPIA, COCAINSCRNUR, LABBENZ, AMPHETMU, THCU, LABBARB    IMAGING I have personally reviewed the radiological images below and agree with the radiology interpretations. Blue text is my interpretation.   Ct Angio Head and neck W/cm &/or Wo Cm  12/25/2015  IMPRESSION: 1. Asymmetric size and enhancement of the distal right PCA superior division in proximity to the superior right occipital lobe small intra-axial hemorrhage. This is best demonstrated on series 506, image 13, and raises the possibility of a small vascular malformation. No large or definitely abnormal draining veins, and no recent MRI findings, to suggest a high-flow AVM. Time resolved study (conventional cerebral angiogram) should be confirmatory. 2. Otherwise negative for age head and neck CTA; mild  cervical carotid atherosclerosis. No stenosis or intracranial aneurysm. 3. Stable small volume right occipital intra-axial and mostly left frontal subarachnoid hemorrhage. No new intracranial abnormality. 4. Mild thyromegaly. No clear evidence of AVM, the asymmetry seen likely due to venous phase contamination  due to focusing on ruling out CVT. No CVT was seen.   Ct Head Wo Contrast  12/24/2015  IMPRESSION: 1. Focus of acute hemorrhage in the right occipital lobe near the gray-white junction. This could reflect a hemorrhagic metastasis or hemorrhage from amyloid angiopathy. There is no history of trauma or anti coagulation. 2. Possible cortical thickening in the left frontal lobe suggesting possible cerebritis. 3. MRI without and with contrast recommended for further evaluation.  Mr Jeri Cos Wo Contrast  12/24/2015  IMPRESSION: 1. 14 mm acute parenchymal hemorrhage within the right occipital lobe without significant mass effect. No underlying lesion identified. 2. Scattered acute subarachnoid hemorrhage within the left frontal lobe. Etiology of the right occipital and left frontal hemorrhage is uncertain. Primary differential considerations include possible occult trauma or underlying coagulopathy. Hemorrhage related to underlying hypertension could also be considered. Presence of subarachnoid blood is not typical of underlying amyloid angiopathy. The major dural sinuses appear patent. 3. Scattered chronic micro hemorrhages involving both cerebral hemispheres, nonspecific, but most commonly related to chronic underlying hypertension. 4. Mild chronic small vessel ischemic disease.  Chest Port 1 View  12/25/2015   IMPRESSION: 1. Interim removal of left Port-A-Cath. An removal of chest wall surgical drains and tissue expanders. 2.  Cannot exclude mild right perihilar infiltrate.   2D echo pending  LE venous doppler pending   PHYSICAL EXAM  Temp:  [97.4 F (36.3 C)-98.6 F (37 C)] 98.6 F (37 C) (01/31 1200) Pulse Rate:  [62-142] 64 (01/31 1100) Resp:  [10-29] 10 (01/31 1100) BP: (93-148)/(39-93) 117/64 mmHg (01/31 1100) SpO2:  [90 %-100 %] 100 % (01/31 1100) Weight:  [152 lb 1.9 oz (69 kg)-157 lb (71.215 kg)] 152 lb 1.9 oz (69 kg) (01/31 0147)  General - Well nourished, well developed, in no  apparent distress.  Ophthalmologic - Fundi not visualized due to eye movement.  Cardiovascular - Regular rate and rhythm with no murmur, no afib rhythm.  Mental Status -  Level of arousal and orientation to time, place, and person were intact. Language including expression, naming, repetition, comprehension was assessed and found intact. Attention span and concentration exam showed able to do backward spelling but not able to do calculation. Fund of Knowledge was assessed and was intact.  Cranial Nerves II - XII - II - Visual field intact OU. III, IV, VI - Extraocular movements intact. V - Facial sensation intact bilaterally. VII - Facial movement intact bilaterally. VIII - Hearing & vestibular intact bilaterally. X - Palate elevates symmetrically. XI - Chin turning & shoulder shrug intact bilaterally. XII - Tongue protrusion intact.  Motor Strength - The patient's strength was normal in all extremities and pronator drift was absent.  Bulk was normal and fasciculations were absent.   Motor Tone - Muscle tone was assessed at the neck and appendages and was normal.  Reflexes - The patient's reflexes were 1+ in all extremities and she had no pathological reflexes.  Sensory - Light touch, temperature/pinprick were assessed and were symmetrical.    Coordination - The patient had normal movements in the hands and feet with no ataxia or dysmetria.  Tremor was absent.  Gait and Station - deferred due to safety concerns.   ASSESSMENT/PLAN Ms. Barbara [name] is  a 65 y.o. female with history of breast cancer s/p chemo and surgery on anastrozole, Hashimoto's thyroiditis, hypercholesterolemia and ? Punctate stroke in 2015 presenting with acute onset of severe vertigo with nausea, new onset atrial fibrillation. Imaging showed a R occipital hemorrhage and a L frontal SAH.   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 Current evidence does not support aneurysm, AVM, RCVS or CVT.   Resultant  Resolution of symptoms  MRI confirmed right occipital small ICH with left frontal SAH. Previous MRI in 12/2014 suspicious for CAA  CTA head and neck unremarkable.  2D Echo  Pending  LE venous doppler pending  LDL pending  HgbA1c pending  SCDs for VTE prophylaxis Diet Heart Room service appropriate?: Yes; Fluid consistency:: Thin  aspirin 81 mg daily prior to admission, now on No antithrombotic secondary to hemorrhage.  Ongoing aggressive stroke risk factor management  Therapy recommendations:  pending  Disposition:  pending  Atrial fibrillation w/ RVR - new onset - now in SNR  New onset found in ER EKG  Converted to normal sinus rhythm in the ED   Pt denies any palpitation  Not candidate for any anticoagulation due to hemorrhage and concerning for CAA.  Hyperlipidemia  Home meds:  Lipitor 10   LDL pending, goal < 70  Resume home statin  Continue statin at discharge  Other Stroke Risk Factors  Advanced age  Hx stroke/TIA  09/2014 MRI showed punctate left cerebellum infarct vs. Demyelinating disease  Other Active Problems  Cognitive difficulties with word retrieval problems, improved post chemo and retirement [facility])  Hypothyroidism - on supplement with normal TSH   Lifelong anxiety  Hospital day # 0  This patient is critically ill due to right occipital ICH with left frontal SAH and at significant risk of neurological worsening, death form hematoma expansion and vasospasm, cerebral edema with herniation. This patient's care requires constant monitoring of vital signs, hemodynamics, respiratory and cardiac monitoring, review of multiple databases, neurological assessment, discussion with family, other specialists and medical decision making of high complexity. I spent 45 minutes of neurocritical care time in the care of this patient.   Rosalin Hawking,  MD PhD Stroke Neurology 12/25/2015 5:01 PM   To contact Stroke Continuity provider, please refer to [email or web address] After hours, contact General Neurology

## 2015-12-25 NOTE — Progress Notes (Signed)
Utilization review completed. Yemariam Magar, RN, BSN. 

## 2015-12-26 ENCOUNTER — Inpatient Hospital Stay (HOSPITAL_COMMUNITY): Payer: PPO

## 2015-12-26 DIAGNOSIS — I639 Cerebral infarction, unspecified: Secondary | ICD-10-CM

## 2015-12-26 DIAGNOSIS — I6789 Other cerebrovascular disease: Secondary | ICD-10-CM

## 2015-12-26 DIAGNOSIS — I480 Paroxysmal atrial fibrillation: Secondary | ICD-10-CM

## 2015-12-26 LAB — CBC
HCT: 41.9 % (ref 36.0–46.0)
HEMATOCRIT: 39.1 % (ref 36.0–46.0)
Hemoglobin: 13.6 g/dL (ref 12.0–15.0)
Hemoglobin: 13.9 g/dL (ref 12.0–15.0)
MCH: 31.7 pg (ref 26.0–34.0)
MCH: 30.8 pg (ref 26.0–34.0)
MCHC: 34.8 g/dL (ref 30.0–36.0)
MCHC: 33.2 g/dL (ref 30.0–36.0)
MCV: 91.1 fL (ref 78.0–100.0)
MCV: 92.7 fL (ref 78.0–100.0)
PLATELETS: 237 10*3/uL (ref 150–400)
Platelets: 339 10*3/uL (ref 150–400)
RBC: 4.29 MIL/uL (ref 3.87–5.11)
RBC: 4.52 MIL/uL (ref 3.87–5.11)
RDW: 13.4 % (ref 11.5–15.5)
RDW: 13.3 % (ref 11.5–15.5)
WBC: 9.2 10*3/uL (ref 4.0–10.5)
WBC: 8.5 10*3/uL (ref 4.0–10.5)

## 2015-12-26 LAB — LIPID PANEL
CHOL/HDL RATIO: 3.9 ratio
CHOL/HDL RATIO: 4.2 ratio
CHOLESTEROL: 169 mg/dL (ref 0–200)
Cholesterol: 143 mg/dL (ref 0–200)
HDL: 37 mg/dL — ABNORMAL LOW (ref >40)
HDL: 40 mg/dL — AB (ref >40)
LDL CALC: 87 mg/dL (ref 0–99)
LDL Cholesterol: 101 mg/dL — ABNORMAL HIGH (ref 0–99)
Triglycerides: 96 mg/dL (ref <150)
Triglycerides: 142 mg/dL (ref <150)
VLDL: 19 mg/dL (ref 0–40)
VLDL: 28 mg/dL (ref 0–40)

## 2015-12-26 LAB — COMPREHENSIVE METABOLIC PANEL
ALBUMIN: 3 g/dL — AB (ref 3.5–5.0)
ALK PHOS: 57 U/L (ref 38–126)
ALT: 18 U/L (ref 14–54)
ALT: 14 U/L (ref 14–54)
ANION GAP: 10 (ref 5–15)
AST: 36 U/L (ref 15–41)
AST: 16 U/L (ref 15–41)
Albumin: 3.7 g/dL (ref 3.5–5.0)
Alkaline Phosphatase: 45 U/L (ref 38–126)
Anion gap: 5 (ref 5–15)
BILIRUBIN TOTAL: 1.2 mg/dL (ref 0.3–1.2)
BILIRUBIN TOTAL: 0.3 mg/dL (ref 0.3–1.2)
BUN: 17 mg/dL (ref 6–20)
BUN: 18 mg/dL (ref 6–20)
CALCIUM: 9.2 mg/dL (ref 8.9–10.3)
CHLORIDE: 104 mmol/L (ref 101–111)
CO2: 24 mmol/L (ref 22–32)
CO2: 23 mmol/L (ref 22–32)
Calcium: 7.6 mg/dL — ABNORMAL LOW (ref 8.9–10.3)
Chloride: 107 mmol/L (ref 101–111)
Creatinine, Ser: 0.96 mg/dL (ref 0.44–1.00)
Creatinine, Ser: 0.97 mg/dL (ref 0.44–1.00)
GFR calc Af Amer: >60 mL/min (ref >60)
GFR calc non Af Amer: >60 mL/min (ref >60)
GFR calc non Af Amer: 60 mL/min — ABNORMAL LOW (ref >60)
GLUCOSE: 98 mg/dL (ref 65–99)
GLUCOSE: 106 mg/dL — AB (ref 65–99)
POTASSIUM: 5.4 mmol/L — AB (ref 3.5–5.1)
Potassium: 4 mmol/L (ref 3.5–5.1)
Sodium: 133 mmol/L — ABNORMAL LOW (ref 135–145)
Sodium: 140 mmol/L (ref 135–145)
TOTAL PROTEIN: 6.7 g/dL (ref 6.5–8.1)
Total Protein: 5.5 g/dL — ABNORMAL LOW (ref 6.5–8.1)

## 2015-12-26 LAB — VITAMIN B12
VITAMIN B 12: 504 pg/mL (ref 180–914)
Vitamin B-12: 325 pg/mL (ref 180–914)

## 2015-12-26 LAB — FOLATE: Folate: 22.1 ng/mL (ref >5.9)

## 2015-12-26 NOTE — Progress Notes (Signed)
VASCULAR LAB PRELIMINARY  PRELIMINARY  PRELIMINARY  PRELIMINARY   Bilateral lower extremity has been completed.    Bilateral:  No evidence of DVT, superficial thrombosis, or Baker's Cyst.   Janifer Adie, RVT, RDMS 12/26/2015, 1:34 PM

## 2015-12-26 NOTE — Progress Notes (Signed)
STROKE TEAM PROGRESS NOTE   SUBJECTIVE (INTERVAL HISTORY) Her husband and daughter are at the bedside.  Overall she feels her condition is stable. She stated that she had a sudden onset vertigo and nausea vomiting while she was watching TV and laughing. Never had a similar episode before. Denies any history of brain hemorrhage in the past. Had the breast cancer, no recurrence, on anastrozole. Pt still has once a while difficulty with word finding but better than before, denies depression.  She followed with Dr. Leta [name] in [facility] one year ago for increasing word finding difficulties, trouble concentration, trouble remembering specific words. MRI in November 2015 showed small punctate acute infarct in the left cerebellum versus demyelinating disease. She was put on aspirin 81. Repeat MRI in February 2016 showed no acute abnormality with several scattered punctate foci of periventricular and subcortical nonspecific T2 hyperintensities. On follow-up in March 2016 with Dr. Leta [name], pt was doing better with memory issues after finishing off chemotherapy.  Overnight, no acute issues. CTA head and neck no CVT or aneurysm. No clear evidence of AVM. Pending LE venous doppler and 2D echo, PT/OT.    OBJECTIVE Temp:  [98 F (36.7 C)-99 F (37.2 C)] 98.1 F (36.7 C) (02/01 1200) Pulse Rate:  [62-73] 72 (02/01 0900) Cardiac Rhythm:  [-] Normal sinus rhythm (02/01 0800) Resp:  [9-21] 15 (02/01 0900) BP: (107-147)/(46-91) 147/72 mmHg (02/01 0900) SpO2:  [96 %-100 %] 100 % (02/01 0900)  CBC:   Recent Labs Lab 12/26/15 0530 12/26/15 0804  WBC 9.2 8.5  HGB 13.6 13.9  HCT 39.1 41.9  MCV 91.1 92.7  PLT 339 [id number]    Basic Metabolic Panel:   Recent Labs Lab 12/24/15 2042 12/26/15 0530 12/26/15 0804  NA 139 133* 140  K 3.6 5.4* 4.0  CL 103 104 107  CO2 23 24 23   GLUCOSE 155* 98 106*  BUN 29* 17 18  CREATININE 1.15* 0.96 0.97  CALCIUM 10.1 7.6* 9.2  MG 1.9  --   --     Lipid Panel:      Component Value Date/Time   CHOL 169 12/26/2015 0804   TRIG 142 12/26/2015 0804   HDL 40* 12/26/2015 0804   CHOLHDL 4.2 12/26/2015 0804   VLDL 28 12/26/2015 0804   LDLCALC 101* 12/26/2015 0804   HgbA1c: No results found for: HGBA1C Urine Drug Screen: No results found for: LABOPIA, COCAINSCRNUR, LABBENZ, AMPHETMU, THCU, LABBARB    IMAGING I have personally reviewed the radiological images below and agree with the radiology interpretations. Blue text is my interpretation.   Ct Angio Head and neck W/cm &/or Wo Cm  12/25/2015  IMPRESSION: 1. Asymmetric size and enhancement of the distal right PCA superior division in proximity to the superior right occipital lobe small intra-axial hemorrhage. This is best demonstrated on series 506, image 13, and raises the possibility of a small vascular malformation. No large or definitely abnormal draining veins, and no recent MRI findings, to suggest a high-flow AVM. Time resolved study (conventional cerebral angiogram) should be confirmatory. 2. Otherwise negative for age head and neck CTA; mild cervical carotid atherosclerosis. No stenosis or intracranial aneurysm. 3. Stable small volume right occipital intra-axial and mostly left frontal subarachnoid hemorrhage. No new intracranial abnormality. 4. Mild thyromegaly. No clear evidence of AVM, the asymmetry seen likely due to venous phase contamination due to focusing on ruling out CVT. No CVT was seen.   Ct Head Wo Contrast  12/24/2015  IMPRESSION: 1. Focus of acute hemorrhage in  the right occipital lobe near the gray-white junction. This could reflect a hemorrhagic metastasis or hemorrhage from amyloid angiopathy. There is no history of trauma or anti coagulation. 2. Possible cortical thickening in the left frontal lobe suggesting possible cerebritis. 3. MRI without and with contrast recommended for further evaluation.  Mr Barbara [name] Wo Contrast  12/24/2015  IMPRESSION: 1. 14 mm acute parenchymal hemorrhage  within the right occipital lobe without significant mass effect. No underlying lesion identified. 2. Scattered acute subarachnoid hemorrhage within the left frontal lobe. Etiology of the right occipital and left frontal hemorrhage is uncertain. Primary differential considerations include possible occult trauma or underlying coagulopathy. Hemorrhage related to underlying hypertension could also be considered. Presence of subarachnoid blood is not typical of underlying amyloid angiopathy. The major dural sinuses appear patent. 3. Scattered chronic micro hemorrhages involving both cerebral hemispheres, nonspecific, but most commonly related to chronic underlying hypertension. 4. Mild chronic small vessel ischemic disease.  Chest Port 1 View  12/25/2015   IMPRESSION: 1. Interim removal of left Port-A-Cath. An removal of chest wall surgical drains and tissue expanders. 2.  Cannot exclude mild right perihilar infiltrate.   2D echo - Left ventricle: The cavity size was normal. Wall thickness was normal. Systolic function was normal. The estimated ejection fraction was in the range of 60% to 65%. Wall motion was normal; there were no regional wall motion abnormalities. There was no evidence of elevated ventricular filling pressure by Doppler parameters. Impressions: - No cardiac source of emboli was indentified. Compared to the prior study, there has been no significant interval change.  LE venous doppler negative for DVTs   PHYSICAL EXAM  Temp:  [98 F (36.7 C)-99 F (37.2 C)] 98.1 F (36.7 C) (02/01 1200) Pulse Rate:  [62-73] 72 (02/01 0900) Resp:  [9-21] 15 (02/01 0900) BP: (107-147)/(46-91) 147/72 mmHg (02/01 0900) SpO2:  [96 %-100 %] 100 % (02/01 0900)  General - Well nourished, well developed, in no apparent distress.  Ophthalmologic - Fundi not visualized due to eye movement.  Cardiovascular - Regular rate and rhythm with no murmur, no afib rhythm.  Mental Status -   Level of arousal and orientation to time, place, and person were intact. Language including expression, naming, repetition, comprehension was assessed and found intact. Attention span and concentration exam showed able to do backward spelling but not able to do calculation. Fund of Knowledge was assessed and was intact.  Cranial Nerves II - XII - II - Visual field intact OU. III, IV, VI - Extraocular movements intact. V - Facial sensation intact bilaterally. VII - Facial movement intact bilaterally. VIII - Hearing & vestibular intact bilaterally. X - Palate elevates symmetrically. XI - Chin turning & shoulder shrug intact bilaterally. XII - Tongue protrusion intact.  Motor Strength - The patient's strength was normal in all extremities and pronator drift was absent.  Bulk was normal and fasciculations were absent.   Motor Tone - Muscle tone was assessed at the neck and appendages and was normal.  Reflexes - The patient's reflexes were 1+ in all extremities and she had no pathological reflexes.  Sensory - Light touch, temperature/pinprick were assessed and were symmetrical.    Coordination - The patient had normal movements in the hands and feet with no ataxia or dysmetria.  Tremor was absent.  Gait and Station - deferred due to safety concerns.   ASSESSMENT/PLAN Ms. Barbara [name] is a 65 y.o. female with history of breast cancer s/p chemo and surgery on anastrozole,  Hashimoto's thyroiditis, hypercholesterolemia and ? Punctate stroke in 2015 presenting with acute onset of severe vertigo with nausea, new onset atrial fibrillation. Imaging showed a R occipital hemorrhage and a L frontal SAH.   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 No evidence for aneurysm, AVM, RCVS or CVT.   Resultant  Resolution of symptoms  MRI confirmed right occipital small ICH with left frontal SAH.  Previous MRI in 12/2014 suspicious for CAA  CTA head and neck unremarkable.  2D Echo  EF 60-65%  LE venous doppler negative for DVTs  LDL 101  HgbA1c pending  SCDs for VTE prophylaxis Diet Heart Room service appropriate?: Yes; Fluid consistency:: Thin  aspirin 81 mg daily prior to admission, now on No antithrombotic secondary to hemorrhage.  Ongoing aggressive stroke risk factor management  Therapy recommendations:  HH PT/OT  Disposition:  pending  Atrial fibrillation w/ RVR - new onset - now in SNR  New onset found in ER EKG associated with nausea and vomiting  Converted to normal sinus rhythm in the ED   Pt denies any palpitation  No afib on tele during admission  Not candidate for any anticoagulation due to hemorrhage and concerning for CAA.  Hyperlipidemia  Home meds:  Lipitor 10   LDL 101, goal < 70  Resume home statin  Continue statin at discharge  Other Stroke Risk Factors  Advanced age  Hx stroke/TIA  09/2014 MRI showed punctate left cerebellum infarct vs. Demyelinating disease  Other Active Problems  Cognitive difficulties with word retrieval problems, improved post chemo and retirement [facility])  Hypothyroidism - on supplement with normal TSH   Lifelong anxiety  Hospital day # 1   Rosalin Hawking, MD PhD Stroke Neurology 12/26/2015 2:06 PM   To contact Stroke Continuity provider, please refer to [email or web address] After hours, contact General Neurology

## 2015-12-26 NOTE — Evaluation (Signed)
Speech Language Pathology Evaluation Patient Details Name: Barbara [name] MRN: [id number] DOB: [birth year] Today's Date: 12/26/2015 Time: [id number] SLP Time Calculation (min) (ACUTE ONLY): 27 min  Problem List:  Patient Active Problem List   Diagnosis Date Noted  . Subarachnoid hemorrhage ([place]) 12/25/2015  . Cerebellar stroke ([place]) 12/02/2014  . Osteopenia 12/01/2014  . Rhinitis, chronic 01/13/2014  . Breast cancer of upper-outer quadrant of right female breast ([place]) 09/26/2013   Past Medical History:  Past Medical History  Diagnosis Date  . Breast cancer ([place])   . Hashimoto's thyroiditis     with goiter  . Hypercholesteremia   . 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  Narrowing of the 4th and 5th lumbar interspaces.   Marland Kitchen Spina bifida ([place])     occutta L5  . Anxiety   . GERD (gastroesophageal reflux disease)   . Headache(784.0)     hx migraines  . Varicose vein    Past Surgical History:  Past Surgical History  Procedure Laterality Date  . Hernia repair  1976    Left groin  . Tubal ligation    . Cystoscopy with urethral caruncle    . Mastectomy w/ sentinel node biopsy Bilateral 10/13/2013    Procedure:  BILATERAL MASTECTOMY WITH RIGHT SENTINEL LYMPH NODE BIOPSY;  Surgeon: Merrie Roof, MD;  Location: [place];  Service: General;  Laterality: Bilateral;  . Portacath placement Left 10/13/2013    Procedure: INSERTION PORT-A-CATH;  Surgeon: Merrie Roof, MD;  Location: [place];  Service: General;  Laterality: Left;  . Breast reconstruction with placement of tissue expander and flex hd (acellular hydrated dermis) Bilateral 10/13/2013    Procedure: PLACEMENT OF BILATERAL TISSUE EXPANDER FOR BREAST RECONSTRUCTION ;  Surgeon: Crissie Reese, MD;  Location: [place];  Service: Plastics;  Laterality: Bilateral;  . Wisdom tooth extraction  1990  . Port-a-cath removal Left 12/21/2014    Procedure: MINOR REMOVAL OF PORT-A-CATH;  Surgeon:  Autumn Messing III, MD;  Location: [place];  Service: General;  Laterality: Left;   HPI:  65 y/o female with h/o breast CA, Hashimoto's thyroiditis, spina bifida occulta, admitted with intense vertigo described as spinning with nausea and vomiting. Both CT/MRI show parenchymal hemorrhaging in the R occipital lobe and acute SAH in the L frontal lobe   Assessment / Plan / Recommendation Clinical Impression  Pt presents with subtle deficits in higher-level naming, repetition, convergent thinking, and recall of task instructions.  Scored 25/30 on MOCA.  Recommend 24 hour supervision initially at D/C; SLP evaluation next venue of care to determine if pt would benefit from SLP intervention.  Acute care SLP to sign off.    SLP Assessment  All further Speech Lanaguage Pathology  needs can be addressed in the next venue of care    Follow Up Recommendations  Outpatient SLP    Frequency and Duration           SLP Evaluation Prior Functioning  Cognitive/Linguistic Baseline: Within functional limits Type of Home: House  Lives With: Spouse Available Help at Discharge: Family;Available 24 hours/day   Cognition  Overall Cognitive Status: Impaired/Different from baseline Arousal/Alertness: Awake/alert Orientation Level: Oriented X4 Attention: Selective Selective Attention: Appears intact Memory: Impaired Memory Impairment: Retrieval deficit Awareness: Appears intact Problem Solving: Impaired Problem Solving Impairment: Verbal complex Safety/Judgment: Appears intact    Comprehension  Auditory Comprehension Overall Auditory Comprehension: Appears within functional limits for tasks assessed  Visual Recognition/Discrimination Discrimination: Within Function Limits Reading Comprehension Reading Status: Within funtional limits    Expression Expression Primary Mode of Expression: Verbal Verbal Expression Repetition: Impaired Level of Impairment: Sentence level Naming:  Impairment Convergent: 75-100% accurate Written Expression Dominant Hand: Right Written Expression: Not tested   Oral / Motor  Oral Motor/Sensory Function Overall Oral Motor/Sensory Function: Within functional limits Motor Speech Overall Motor Speech: Appears within functional limits for tasks assessed   GO                    Juan Quam Laurice 12/26/2015, 2:56 PM

## 2015-12-26 NOTE — Evaluation (Signed)
Physical Therapy Evaluation Patient Details Name: Barbara [name] MRN: [id number] DOB: 05-Feb-1951 Today's Date: 12/26/2015   History of Present Illness  pt is a 65 y/o female with h/o breast CA, Hashimoto's thyroiditis, spina bifida occulta, admitted with intense  vertigo described as spinning with nausea and vomiting.  Both CT/MRI show parenchymal hemorrhaging in the R occipital lobe and acute SAH in the L frontal lobe.  Clinical Impression  Pt admitted with/for R occipital ICH and L frontal SAH.  Pt currently limited functionally due to the problems listed below.  (see problems list.)  Pt will benefit from PT to maximize function and safety to be able to get home safely with available assist of family.     Follow Up Recommendations Supervision/Assistance - 24 hour;Home health PT (work toward [employer])    Optician, dispensing  Other (comment) (TBA)    Recommendations for Other Services       Precautions / Restrictions Precautions Precautions: Fall      Mobility  Bed Mobility               General bed mobility comments: NT up in the chair  Transfers Overall transfer level: Needs assistance   Transfers: Sit to/from Stand Sit to Stand: Supervision         General transfer comment: generally steady, but using visual compensation for mild vertigo.  Ambulation/Gait Ambulation/Gait assistance: Min guard Ambulation Distance (Feet): 250 Feet Assistive device: None Gait Pattern/deviations: Step-through pattern Gait velocity: slower   General Gait Details: short guarded steps with body and UE's held in guarded position.  Pt using visual compensation strategiy to decrease vertigo and disequilibrium.  Stairs            Wheelchair Mobility    Modified Rankin (Stroke Patients Only)       Balance Overall balance assessment: Needs assistance Sitting-balance support: No upper extremity supported Sitting balance-Leahy Scale: Fair     Standing balance  support: No upper extremity supported Standing balance-Leahy Scale: Fair                               Pertinent Vitals/Pain Pain Assessment: Faces Faces Pain Scale: No hurt    Home Living Family/patient expects to be discharged to:: Private residence Living Arrangements: Spouse/significant other Available Help at Discharge: Family;Available 24 hours/day Type of Home: House Home Access: Stairs to enter   [employer] of Steps: 1 Home Layout: Two level;1/2 bath on main level;Bed/bath upstairs        Prior Function Level of Independence: Independent               Hand Dominance        Extremity/Trunk Assessment   Upper Extremity Assessment: Defer to OT evaluation (functional)           Lower Extremity Assessment: Overall WFL for tasks assessed (mild proximal weakness)         Communication   Communication: No difficulties  Cognition Arousal/Alertness: Awake/alert Behavior During Therapy: WFL for tasks assessed/performed Overall Cognitive Status: Within Functional Limits for tasks assessed                      General Comments General comments (skin integrity, edema, etc.): VSS    Exercises        Assessment/Plan    PT Assessment Patient needs continued PT services  PT Diagnosis Abnormality of gait;Generalized weakness;Other (comment) (vertigo)   PT Problem  List Decreased strength;Decreased activity tolerance;Decreased balance;Decreased coordination  PT Treatment Interventions DME instruction;Gait training;Functional mobility training;Therapeutic activities;Balance training;Patient/family education   PT Goals (Current goals can be found in the Care Plan section) Acute Rehab PT Goals Patient Stated Goal: Be able to be independent at home PT Goal Formulation: With patient Time For Goal Achievement: 01/09/16 Potential to Achieve Goals: Good    Frequency Min 3X/week   Barriers to discharge        Co-evaluation                End of Session   Activity Tolerance: Patient tolerated treatment well Patient left: in chair;with call bell/phone within reach Nurse Communication: Mobility status         Time: 1139-1208 PT Time Calculation (min) (ACUTE ONLY): 29 min   Charges:   PT Evaluation $PT Eval Moderate Complexity: 1 Procedure PT Treatments $Gait Training: 8-22 mins   PT G Codes:        Savon Cobbs, Tessie Fass 12/26/2015, 12:30 PM 12/26/2015  Donnella Sham, PT [phone]  (pager)

## 2015-12-26 NOTE — Progress Notes (Addendum)
Per pt - no lymph nodes removed from L arm. States, "They only took three lymph nodes out on the right side"

## 2015-12-26 NOTE — Clinical Documentation Improvement (Signed)
Neurology  Can the diagnosis of Atrial Fibrillation be further specified?   Chronic Atrial fibrillation  Paroxysmal Atrial fibrillation  Permanent Atrial fibrillation  Persistent Atrial fibrillation  Other  Clinically Undetermined  Document any associated diagnoses/conditions.   Supporting Information: New onset of atrial fibrillation noted per 01/31 ED note.   Please exercise your independent, professional judgment when responding. A specific answer is not anticipated or expected.   Thank You,  [place] [phone]

## 2015-12-26 NOTE — Progress Notes (Signed)
  Echocardiogram 2D Echocardiogram has been performed.  Barbara [name] M 12/26/2015, 11:04 AM

## 2015-12-26 NOTE — Progress Notes (Signed)
Patient arrived around 1715 alert and oriented no pain with husband will continue to monitor.

## 2015-12-26 NOTE — Evaluation (Signed)
Occupational Therapy Evaluation Patient Details Name: Barbara [name] MRN: [id number] DOB: 1951/04/05 Today's Date: 12/26/2015    History of Present Illness pt is a 66 y/o female with h/o breast CA, Hashimoto's thyroiditis, spina bifida occulta, admitted with intense  vertigo described as spinning with nausea and vomiting.  Both CT/MRI show parenchymal hemorrhaging in the R occipital lobe and acute SAH in the L frontal lobe.   Clinical Impression   Pt admitted with above. She demonstrates the below listed deficits and will benefit from continued OT to maximize safety and independence with BADLs.  Pt presents to OT with generalized weakness, and higher level cognitive deficits.  Currently, she requires min guard assist for ADLs.  Recommend OPOT       Follow Up Recommendations  Outpatient OT;Supervision/Assistance - 24 hour    Equipment Recommendations  None recommended by OT    Recommendations for Other Services       Precautions / Restrictions Precautions Precautions: Fall      Mobility Bed Mobility Overal bed mobility: Modified Independent             General bed mobility comments: NT up in the chair  Transfers Overall transfer level: Needs assistance   Transfers: Sit to/from Stand;Stand Pivot Transfers Sit to Stand: Supervision Stand pivot transfers: Supervision       General transfer comment: generally steady, but using visual compensation for mild vertigo.    Balance Overall balance assessment: Needs assistance Sitting-balance support: No upper extremity supported Sitting balance-Leahy Scale: Fair     Standing balance support: During functional activity Standing balance-Leahy Scale: Fair                              ADL Overall ADL's : Needs assistance/impaired Eating/Feeding: Independent   Grooming: Wash/dry hands;Wash/dry face;Oral care;Brushing hair;Min guard;Standing   Upper Body Bathing: Set up;Sitting   Lower Body Bathing:  Min guard;Sit to/from stand   Upper Body Dressing : Set up;Sitting   Lower Body Dressing: Min guard;Sit to/from stand   Toilet Transfer: Min guard;Ambulation;Comfort height toilet   Toileting- Clothing Manipulation and Hygiene: Min guard;Sit to/from stand       Functional mobility during ADLs: Min guard       Vision Vision Assessment?: Yes Eye Alignment: Within Functional Limits Alignment/Gaze Preference: Within Defined Limits Tracking/Visual Pursuits: Able to track stimulus in all quads without difficulty Visual Fields: No apparent deficits Additional Comments: Pt able to read one page of information without difficulty    Perception Perception Perception Tested?: Yes   Praxis      Pertinent Vitals/Pain Pain Assessment: No/denies pain Faces Pain Scale: No hurt     Hand Dominance Right   Extremity/Trunk Assessment Upper Extremity Assessment Upper Extremity Assessment: Overall WFL for tasks assessed   Lower Extremity Assessment Lower Extremity Assessment: Defer to PT evaluation   Cervical / Trunk Assessment Cervical / Trunk Assessment: Normal   Communication Communication Communication: Other (comment) (occsasional word finding difficulties )   Cognition Arousal/Alertness: Awake/alert Behavior During Therapy: WFL for tasks assessed/performed Overall Cognitive Status: Impaired/Different from baseline Area of Impairment: Attention;Problem solving   Current Attention Level: Alternating           General Comments: Pt able perform serial subraction by 7s with one error and increased time.  She is slow to process information.  Need to assess executive functions    General Comments       Exercises  Shoulder Instructions      Home Living Family/patient expects to be discharged to:: Private residence Living Arrangements: Spouse/significant other Available Help at Discharge: Family;Available 24 hours/day Type of Home: House Home Access: Stairs to  enter [employer] of Steps: 1   Home Layout: Two level;1/2 bath on main level;Bed/bath upstairs Alternate Level Stairs-Number of Steps: 12 Alternate Level Stairs-Rails: Right;Left Bathroom Shower/Tub: Occupational psychologist: Standard     Home Equipment: Grab bars - tub/shower          Prior Functioning/Environment Level of Independence: Independent        Comments: Pt is retired [employer] treasurer    OT Diagnosis: Generalized weakness;Cognitive deficits   OT Problem List: Decreased strength;Decreased activity tolerance;Impaired balance (sitting and/or standing);Decreased cognition   OT Treatment/Interventions: Self-care/ADL training;DME and/or AE instruction;Therapeutic activities;Cognitive remediation/compensation;Patient/family education;Balance training    OT Goals(Current goals can be found in the care plan section) Acute Rehab OT Goals Patient Stated Goal: To regain independence  OT Goal Formulation: With patient Time For Goal Achievement: 01/09/16 Potential to Achieve Goals: Good ADL Goals Additional ADL Goal #1: Pt will be mod I with all ADLs Additional ADL Goal #2: Pt will perform pathfinding with supervision  Additional ADL Goal #3: Pt perform money management with supervision   OT Frequency: Min 2X/week   Barriers to D/C:            Co-evaluation              End of Session Nurse Communication: Mobility status  Activity Tolerance: Patient tolerated treatment well Patient left: in bed;with call bell/phone within reach   Time: 1251-1313 OT Time Calculation (min): 22 min Charges:  OT General Charges $OT Visit: 1 Procedure OT Evaluation $OT Eval Moderate Complexity: 1 Procedure G-Codes:    [name] 05-Jan-2016, 1:30 PM

## 2015-12-27 DIAGNOSIS — E785 Hyperlipidemia, unspecified: Secondary | ICD-10-CM

## 2015-12-27 DIAGNOSIS — I4891 Unspecified atrial fibrillation: Secondary | ICD-10-CM

## 2015-12-27 DIAGNOSIS — R4189 Other symptoms and signs involving cognitive functions and awareness: Secondary | ICD-10-CM

## 2015-12-27 DIAGNOSIS — E039 Hypothyroidism, unspecified: Secondary | ICD-10-CM

## 2015-12-27 DIAGNOSIS — F419 Anxiety disorder, unspecified: Secondary | ICD-10-CM

## 2015-12-27 DIAGNOSIS — I619 Nontraumatic intracerebral hemorrhage, unspecified: Secondary | ICD-10-CM

## 2015-12-27 HISTORY — DX: Unspecified atrial fibrillation: I48.91

## 2015-12-27 LAB — FOLATE RBC
Folate, Hemolysate: 402.4 ng/mL
Folate, RBC: 999 ng/mL (ref >498)
Hematocrit: 40.3 % (ref 34.0–46.6)

## 2015-12-27 LAB — HEMOGLOBIN A1C
HEMOGLOBIN A1C: 5.8 % — AB (ref 4.8–5.6)
Hgb A1c MFr Bld: 5.8 % — ABNORMAL HIGH (ref 4.8–5.6)
Mean Plasma Glucose: 120 mg/dL
Mean Plasma Glucose: 120 mg/dL

## 2015-12-27 NOTE — Progress Notes (Signed)
STROKE TEAM PROGRESS NOTE   SUBJECTIVE (INTERVAL HISTORY) Her husband is at the bedside.  Overnight, she has been stable. Therapies have evaluated and made recommendations for OP PT/OT.   OBJECTIVE Temp:  [98.1 F (36.7 C)-98.5 F (36.9 C)] 98.3 F (36.8 C) (02/02 0507) Pulse Rate:  [67-74] 74 (02/02 0507) Cardiac Rhythm:  [-] Normal sinus rhythm (02/02 0737) Resp:  [16-23] 16 (02/02 0507) BP: (108-155)/(56-82) 123/78 mmHg (02/02 0507) SpO2:  [97 %-100 %] 97 % (02/02 0507)  CBC:   Recent Labs Lab 12/26/15 0530 12/26/15 0804  WBC 9.2 8.5  HGB 13.6 13.9  HCT 39.1 41.9  MCV 91.1 92.7  PLT 339 [id number]    Basic Metabolic Panel:   Recent Labs Lab 12/24/15 2042 12/26/15 0530 12/26/15 0804  NA 139 133* 140  K 3.6 5.4* 4.0  CL 103 104 107  CO2 23 24 23   GLUCOSE 155* 98 106*  BUN 29* 17 18  CREATININE 1.15* 0.96 0.97  CALCIUM 10.1 7.6* 9.2  MG 1.9  --   --     Lipid Panel:     Component Value Date/Time   CHOL 169 12/26/2015 0804   TRIG 142 12/26/2015 0804   HDL 40* 12/26/2015 0804   CHOLHDL 4.2 12/26/2015 0804   VLDL 28 12/26/2015 0804   LDLCALC 101* 12/26/2015 0804   HgbA1c:  Lab Results  Component Value Date   HGBA1C 5.8* 12/26/2015   Urine Drug Screen: No results found for: LABOPIA, COCAINSCRNUR, LABBENZ, AMPHETMU, THCU, LABBARB    IMAGING I have personally reviewed the radiological images below and agree with the radiology interpretations. Blue text is Dr. Phoebe Sharps interpretation.   Ct Angio Head and neck W/cm &/or Wo Cm  12/25/2015  IMPRESSION: 1. Asymmetric size and enhancement of the distal right PCA superior division in proximity to the superior right occipital lobe small intra-axial hemorrhage. This is best demonstrated on series 506, image 13, and raises the possibility of a small vascular malformation. No large or definitely abnormal draining veins, and no recent MRI findings, to suggest a high-flow AVM. Time resolved study (conventional cerebral  angiogram) should be confirmatory. 2. Otherwise negative for age head and neck CTA; mild cervical carotid atherosclerosis. No stenosis or intracranial aneurysm. 3. Stable small volume right occipital intra-axial and mostly left frontal subarachnoid hemorrhage. No new intracranial abnormality. 4. Mild thyromegaly. No clear evidence of AVM, the asymmetry seen likely due to venous phase contamination due to focusing on ruling out CVT. No CVT was seen.   Ct Head Wo Contrast  12/24/2015  IMPRESSION: 1. Focus of acute hemorrhage in the right occipital lobe near the gray-white junction. This could reflect a hemorrhagic metastasis or hemorrhage from amyloid angiopathy. There is no history of trauma or anti coagulation. 2. Possible cortical thickening in the left frontal lobe suggesting possible cerebritis. 3. MRI without and with contrast recommended for further evaluation.  Mr Jeri Cos Wo Contrast  12/24/2015  IMPRESSION: 1. 14 mm acute parenchymal hemorrhage within the right occipital lobe without significant mass effect. No underlying lesion identified. 2. Scattered acute subarachnoid hemorrhage within the left frontal lobe. Etiology of the right occipital and left frontal hemorrhage is uncertain. Primary differential considerations include possible occult trauma or underlying coagulopathy. Hemorrhage related to underlying hypertension could also be considered. Presence of subarachnoid blood is not typical of underlying amyloid angiopathy. The major dural sinuses appear patent. 3. Scattered chronic micro hemorrhages involving both cerebral hemispheres, nonspecific, but most commonly related to chronic underlying hypertension.  4. Mild chronic small vessel ischemic disease.  Chest Port 1 View  12/25/2015   IMPRESSION: 1. Interim removal of left Port-A-Cath. An removal of chest wall surgical drains and tissue expanders. 2.  Cannot exclude mild right perihilar infiltrate.   2D echo  - Left ventricle: The cavity  size was normal. Wall thickness wasnormal. Systolic function was normal. The estimated ejectionfraction was in the range of 60% to 65%. Wall motion was normal;there were no regional wall motion abnormalities. There was noevidence of elevated ventricular filling pressure by Dopplerparameters. -  Impressions:- No cardiac source of emboli was indentified. Compared to theprior study, there has been no significant interval change.  LE venous doppler negative for DVTs   PHYSICAL EXAM  Temp:  [98.1 F (36.7 C)-98.5 F (36.9 C)] 98.3 F (36.8 C) (02/02 0507) Pulse Rate:  [67-74] 74 (02/02 0507) Resp:  [16-23] 16 (02/02 0507) BP: (108-155)/(56-82) 123/78 mmHg (02/02 0507) SpO2:  [97 %-100 %] 97 % (02/02 0507)  General - Well nourished, well developed, in no apparent distress.  Ophthalmologic - Fundi not visualized due to eye movement.  Cardiovascular - Regular rate and rhythm with no murmur, no afib rhythm.  Mental Status -  Level of arousal and orientation to time, place, and person were intact. Language including expression, naming, repetition, comprehension was assessed and found intact. Attention span and concentration exam showed able to do backward spelling but not able to do calculation. Fund of Knowledge was assessed and was intact.  Cranial Nerves II - XII - II - Visual field intact OU. III, IV, VI - Extraocular movements intact. V - Facial sensation intact bilaterally. VII - Facial movement intact bilaterally. VIII - Hearing & vestibular intact bilaterally. X - Palate elevates symmetrically. XI - Chin turning & shoulder shrug intact bilaterally. XII - Tongue protrusion intact.  Motor Strength - The patient's strength was normal in all extremities and pronator drift was absent.  Bulk was normal and fasciculations were absent.   Motor Tone - Muscle tone was assessed at the neck and appendages and was normal.  Reflexes - The patient's reflexes were 1+ in all extremities  and she had no pathological reflexes.  Sensory - Light touch, temperature/pinprick were assessed and were symmetrical.    Coordination - The patient had normal movements in the hands and feet with no ataxia or dysmetria.  Tremor was absent.  Gait and Station - deferred to PT/OT due to safety concerns.   ASSESSMENT/PLAN Barbara [name] is a 65 y.o. female with history of breast cancer s/p chemo and surgery on anastrozole, Hashimoto's thyroiditis, hypercholesterolemia and ? Punctate stroke in 2015 presenting with acute onset of severe vertigo with nausea, new onset atrial fibrillation. Imaging showed a R occipital hemorrhage and a L frontal SAH.   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 No evidence for aneurysm, AVM, RCVS or CVT.   Resultant  Resolution of symptoms  MRI confirmed right occipital small ICH with left frontal SAH. Previous MRI in 12/2014 suspicious for CAA  CTA head and neck unremarkable.  2D Echo  EF 60-65%  LE venous doppler negative for DVTs  LDL 101  HgbA1c 5.8  SCDs for VTE prophylaxis Diet Heart Room service appropriate?: Yes; Fluid consistency:: Thin  aspirin 81 mg daily prior to admission, now on No antithrombotic secondary to hemorrhage.  Ongoing aggressive stroke risk factor management  Therapy recommendations:  HH PT/  OP OT, OP ST  Disposition:  Discharge home today  Follow up Dr. Leta Baptist - plan f/u CT vs MRI in 1 month  Paroxysmal atrial fibrillation w/ RVR - new onset - now in NSR  New onset found in ER EKG associated with nausea and vomiting  Converted to normal sinus rhythm in the ED   Pt denies any palpitation  No afib on tele during admission  Not candidate for any anticoagulation due to hemorrhage and concerning for CAA.  Hyperlipidemia  Home meds:  Lipitor 10   LDL 101, goal < 70  Resume home statin  Continue statin  at discharge  Other Stroke Risk Factors  Advanced age  Hx stroke/TIA  09/2014 MRI showed punctate left cerebellum infarct vs. Demyelinating disease  Other Active Problems  Cognitive difficulties with word retrieval problems, improved post chemo and retirement [facility])  Hypothyroidism - on supplement with normal TSH   Lifelong anxiety  Hospital day # 2  Rosalin Hawking, MD PhD Stroke Neurology 12/27/2015 2:32 PM   To contact Stroke Continuity provider, please refer to [email or web address] After hours, contact General Neurology

## 2015-12-27 NOTE — Progress Notes (Signed)
Pt discharged  with husband at this time. IV discontinued, dry dressing applied. Discharge instructions provided with verbal understanding. No noted distress.

## 2015-12-27 NOTE — Care Management Note (Signed)
Case Management Note  Patient Details  Name: Barbara [name] MRN: [id number] Date of Birth: 1950/11/29  Subjective/Objective:                    Action/Plan: Plan is for patient to discharge home with her husband. Orders placed for CM for outpatient PT/OT/ST. CM met with the patient and her husband and asked them about [facility]. They were interested in attending Neurorehab. Orders placed for Neurorehab and information placed on the AVS. Will update bedside RN.   Expected Discharge Date:                  Expected Discharge Plan:  Home/Self Care  In-House Referral:     Discharge planning Services  CM Consult  Post Acute Care Choice:    Choice offered to:     DME Arranged:    DME Agency:     HH Arranged:    [place] Agency:     Status of Service:  Completed, signed off  Medicare Important Message Given:    Date Medicare IM Given:    Medicare IM give by:    Date Additional Medicare IM Given:    Additional Medicare Important Message give by:     If discussed at [place] of Stay Meetings, dates discussed:    Additional Comments:  Pollie Friar, RN 12/27/2015, 10:41 AM

## 2015-12-27 NOTE — Discharge Summary (Signed)
Stroke Discharge Summary  Patient ID: Barbara [name]   MRN: [id number]      DOB: 04/12/1951  Date of Admission: 12/24/2015 Date of Discharge: 12/27/2015  Attending Physician:  Rosalin Hawking, MD, Stroke MD Patient's PCP:  [name], MD  DISCHARGE DIAGNOSIS:  Principal Problem:   ICH (intracerebral hemorrhage) ([place]) - R occipital Active Problems:   Subarachnoid hemorrhage ([place]) - L frontal   Atrial fibrillation with RVR ([place])   Hyperlipidemia   Cognitive deficits   Hypothyroidism   Anxiety  BMI: Body mass index is 24.56 kg/(m^2).  Past Medical History  Diagnosis Date  . Breast cancer ([place])   . Hashimoto's thyroiditis     with goiter  . Hypercholesteremia   . 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  Narrowing of the 4th and 5th lumbar interspaces.   Marland Kitchen Spina bifida ([place])     occutta L5  . Anxiety   . GERD (gastroesophageal reflux disease)   . Headache(784.0)     hx migraines  . Varicose vein    Past Surgical History  Procedure Laterality Date  . Hernia repair  1976    Left groin  . Tubal ligation    . Cystoscopy with urethral caruncle    . Mastectomy w/ sentinel node biopsy Bilateral 10/13/2013    Procedure:  BILATERAL MASTECTOMY WITH RIGHT SENTINEL LYMPH NODE BIOPSY;  Surgeon: Merrie Roof, MD;  Location: [facility];  Service: General;  Laterality: Bilateral;  . Portacath placement Left 10/13/2013    Procedure: INSERTION PORT-A-CATH;  Surgeon: Merrie Roof, MD;  Location: [place];  Service: General;  Laterality: Left;  . Breast reconstruction with placement of tissue expander and flex hd (acellular hydrated dermis) Bilateral 10/13/2013    Procedure: PLACEMENT OF BILATERAL TISSUE EXPANDER FOR BREAST RECONSTRUCTION ;  Surgeon: Crissie Reese, MD;  Location: [facility];  Service: Plastics;  Laterality: Bilateral;  . Wisdom tooth extraction  1990  . Port-a-cath removal Left 12/21/2014    Procedure: MINOR REMOVAL OF  PORT-A-CATH;  Surgeon: Autumn Messing III, MD;  Location: [place];  Service: General;  Laterality: Left;      Medication List    STOP taking these medications        aspirin 81 MG tablet      TAKE these medications        anastrozole 1 MG tablet  Commonly known as:  ARIMIDEX  Take 1 tablet (1 mg total) by mouth daily.     atorvastatin 10 MG tablet  Commonly known as:  LIPITOR  Take 10 mg by mouth at bedtime.     BIOTIN PO  Take 5,000 mcg by mouth daily.     Cholecalciferol 5000 units Tabs  Take 1 tablet (5,000 Units total) by mouth daily.     fexofenadine 180 MG tablet  Commonly known as:  ALLEGRA  Take 180 mg by mouth daily.     levothyroxine 50 MCG tablet  Commonly known as:  SYNTHROID, LEVOTHROID  Take 50 mcg by mouth daily before breakfast. Take 50 mcg 1 hours before breakfast.     PARoxetine 20 MG tablet  Commonly known as:  PAXIL  Take 20 mg by mouth at bedtime.     SYSTANE BALANCE 0.6 % Soln  Generic drug:  Propylene Glycol  Apply 1 drop to eye daily as needed (dry eyes).  LABORATORY STUDIES CBC    Component Value Date/Time   WBC 8.5 12/26/2015 0804   WBC 6.4 10/03/2015 1018   RBC 4.52 12/26/2015 0804   RBC 4.44 10/03/2015 1018   HGB 13.9 12/26/2015 0804   HGB 13.6 10/03/2015 1018   HCT 41.9 12/26/2015 0804   HCT 40.7 10/03/2015 1018   PLT 237 12/26/2015 0804   PLT 259 10/03/2015 1018   MCV 92.7 12/26/2015 0804   MCV 91.7 10/03/2015 1018   MCH 30.8 12/26/2015 0804   MCH 30.6 10/03/2015 1018   MCHC 33.2 12/26/2015 0804   MCHC 33.4 10/03/2015 1018   RDW 13.3 12/26/2015 0804   RDW 12.8 10/03/2015 1018   LYMPHSABS 2.1 10/03/2015 1018   MONOABS 0.5 10/03/2015 1018   EOSABS 0.2 10/03/2015 1018   BASOSABS 0.1 10/03/2015 1018   CMP    Component Value Date/Time   NA 140 12/26/2015 0804   NA 138 10/03/2015 1018   K 4.0 12/26/2015 0804   K 4.7 10/03/2015 1018   CL 107 12/26/2015 0804   CO2 23 12/26/2015 0804   CO2 26  10/03/2015 1018   GLUCOSE 106* 12/26/2015 0804   GLUCOSE 85 10/03/2015 1018   BUN 18 12/26/2015 0804   BUN 21.4 10/03/2015 1018   CREATININE 0.97 12/26/2015 0804   CREATININE 0.9 10/03/2015 1018   CALCIUM 9.2 12/26/2015 0804   CALCIUM 9.5 10/03/2015 1018   PROT 6.7 12/26/2015 0804   PROT 7.1 10/03/2015 1018   ALBUMIN 3.7 12/26/2015 0804   ALBUMIN 3.9 10/03/2015 1018   AST 16 12/26/2015 0804   AST 15 10/03/2015 1018   ALT 14 12/26/2015 0804   ALT 13 10/03/2015 1018   ALKPHOS 57 12/26/2015 0804   ALKPHOS 65 10/03/2015 1018   BILITOT 0.3 12/26/2015 0804   BILITOT 0.43 10/03/2015 1018   GFRNONAA 60* 12/26/2015 0804   GFRAA >60 12/26/2015 0804   COAGS Lab Results  Component Value Date   INR 0.97 12/24/2015   Lipid Panel    Component Value Date/Time   CHOL 169 12/26/2015 0804   TRIG 142 12/26/2015 0804   HDL 40* 12/26/2015 0804   CHOLHDL 4.2 12/26/2015 0804   VLDL 28 12/26/2015 0804   LDLCALC 101* 12/26/2015 0804   HgbA1C  Lab Results  Component Value Date   HGBA1C 5.8* 12/26/2015   Cardiac Panel (last 3 results)   Recent Labs  12/24/15 2042  TROPONINI <0.03   Urinalysis    Component Value Date/Time   COLORURINE STRAW* 12/25/2015 0154   APPEARANCEUR CLOUDY* 12/25/2015 0154   LABSPEC 1.015 12/25/2015 0154   PHURINE 7.0 12/25/2015 0154   GLUCOSEU NEGATIVE 12/25/2015 0154   HGBUR TRACE* 12/25/2015 0154   BILIRUBINUR NEGATIVE 12/25/2015 0154   KETONESUR 15* 12/25/2015 0154   PROTEINUR NEGATIVE 12/25/2015 0154   NITRITE NEGATIVE 12/25/2015 0154   LEUKOCYTESUR NEGATIVE 12/25/2015 0154   Urine Drug Screen No results found for: LABOPIA, COCAINSCRNUR, LABBENZ, AMPHETMU, THCU, LABBARB  Alcohol Level No results found for: [facility]   SIGNIFICANT DIAGNOSTIC STUDIES I have personally reviewed the radiological images below and agree with the radiology interpretations. Blue text is Dr. Phoebe Sharps interpretation.   Ct Angio Head and neck W/cm &/or Wo Cm  12/25/2015  IMPRESSION: 1. Asymmetric size and enhancement of the distal right PCA superior division in proximity to the superior right occipital lobe small intra-axial hemorrhage. This is best demonstrated on series 506, image 13, and raises the possibility of a small vascular malformation. No large or definitely  abnormal draining veins, and no recent MRI findings, to suggest a high-flow AVM. Time resolved study (conventional cerebral angiogram) should be confirmatory. 2. Otherwise negative for age head and neck CTA; mild cervical carotid atherosclerosis. No stenosis or intracranial aneurysm. 3. Stable small volume right occipital intra-axial and mostly left frontal subarachnoid hemorrhage. No new intracranial abnormality. 4. Mild thyromegaly. No clear evidence of AVM, the asymmetry seen likely due to venous phase contamination due to focusing on ruling out CVT. No CVT was seen.    Ct Head Wo Contrast  12/24/2015 IMPRESSION: 1. Focus of acute hemorrhage in the right occipital lobe near the gray-white junction. This could reflect a hemorrhagic metastasis or hemorrhage from amyloid angiopathy. There is no history of trauma or anti coagulation. 2. Possible cortical thickening in the left frontal lobe suggesting possible cerebritis. 3. MRI without and with contrast recommended for further evaluation.  Mr Jeri Cos Wo Contrast  12/24/2015 IMPRESSION: 1. 14 mm acute parenchymal hemorrhage within the right occipital lobe without significant mass effect. No underlying lesion identified. 2. Scattered acute subarachnoid hemorrhage within the left frontal lobe. Etiology of the right occipital and left frontal hemorrhage is uncertain. Primary differential considerations include possible occult trauma or underlying coagulopathy. Hemorrhage related to underlying hypertension could also be considered. Presence of subarachnoid blood is not typical of underlying amyloid angiopathy. The major dural sinuses appear patent. 3. Scattered  chronic micro hemorrhages involving both cerebral hemispheres, nonspecific, but most commonly related to chronic underlying hypertension. 4. Mild chronic small vessel ischemic disease.  Chest Port 1 View  12/25/2015 IMPRESSION: 1. Interim removal of left Port-A-Cath. An removal of chest wall surgical drains and tissue expanders. 2. Cannot exclude mild right perihilar infiltrate.   2D echo - Left ventricle: The cavity size was normal. Wall thickness wasnormal. Systolic function was normal. The estimated ejectionfraction was in the range of 60% to 65%. Wall motion was normal;there were no regional wall motion abnormalities. There was noevidence of elevated ventricular filling pressure by Dopplerparameters. - Impressions:- No cardiac source of emboli was indentified. Compared to theprior study, there has been no significant interval change.  LE venous doppler negative for DVTs     HISTORY OF PRESENT ILLNESS Barbara [name] is an 65 y.o. female with a history of breast cancer, Hashimoto's thyroiditis, hypercholesterolemia and stroke in 2015, brought to the emergency room following acute onset of severe vertigo with nausea at 7pm on 12/24/2015. CT scan of her head was obtained which showed acute right occipital intracerebral hemorrhage. MRI of the brain also showed right occipital hemorrhage in addition to left frontal subarachnoid hemorrhage. She's been taking aspirin 81 mg per day. No head trauma occurred according to husband. Patient also is in atrial fibrillation on presentation, which had not previously been documented. She subsequently reverted to sinus rhythm. NIH stroke score was 0 at the time of this evaluation. Patient was not administered TPA secondary to hemorrhage. She was transferred to [facility] and admitted to the neuro ICU for further evaluation and treatment.  She stated that she had a sudden onset vertigo and nausea vomiting while she was watching TV and laughing. Never had a  similar episode before. Denies any history of brain hemorrhage in the past. Had the breast cancer, no recurrence, on anastrozole. Pt still has once a while difficulty with word finding but better than before, denies depression.  She followed with Dr. Leta Baptist in [facility] one year ago for increasing word finding difficulties, trouble concentration, trouble remembering specific words. MRI in  November 2015 showed small punctate acute infarct in the left cerebellum versus demyelinating disease. She was put on aspirin 81. Repeat MRI in February 2016 showed no acute abnormality with several scattered punctate foci of periventricular and subcortical nonspecific T2 hyperintensities. On follow-up in March 2016 with Dr. Leta Baptist, pt was doing better with memory issues after finishing off chemotherapy.   HOSPITAL COURSE Ms. ALYSAN HERRELL is a 65 y.o. female with history of breast cancer s/p chemo and surgery on anastrozole, Hashimoto's thyroiditis, hypercholesterolemia and ? Punctate stroke in 2015 presenting with acute onset of severe vertigo with nausea, new onset atrial fibrillation. Imaging showed a R occipital hemorrhage and a L frontal SAH.   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 No evidence for aneurysm, AVM, RCVS or CVT.   Resultant Resolution of symptoms  MRI confirmed right occipital small ICH with left frontal SAH. Previous MRI in 12/2014 suspicious for CAA  CTA head and neck unremarkable.  2D Echo EF 60-65%  LE venous doppler negative for DVTs  LDL 101  HgbA1c 5.8  aspirin 81 mg daily prior to admission, now on No antithrombotic secondary to hemorrhage.  Ongoing aggressive stroke risk factor management  Therapy recommendations: HH PT/ OP OT, OP ST  Disposition: Discharge home   Follow up Dr. Leta Baptist - plan f/u CT vs MRI in 1 month  Paroxysmal atrial fibrillation w/ RVR -  new onset - now in NSR  New onset found in ER EKG associated with nausea and vomiting  Converted to normal sinus rhythm in the ED   Pt denies any palpitation  No afib on tele during admission  Not candidate for any anticoagulation due to hemorrhage and concerning for CAA.  Hyperlipidemia  Home meds: Lipitor 10   LDL 101, goal < 70  Resume home statin  Continue statin at discharge  Other Stroke Risk Factors  Advanced age  Hx stroke/TIA  09/2014 MRI showed punctate left cerebellum infarct vs. Demyelinating disease  Other Active Problems  Cognitive difficulties with word retrieval problems, improved post chemo and retirement [facility])  Hypothyroidism - on supplement with normal TSH   Lifelong anxiety   DISCHARGE EXAM Blood pressure 130/94, pulse 73, temperature 98 F (36.7 C), temperature source Oral, resp. rate 16, height 5\' 6"  (1.676 m), weight 69 kg (152 lb 1.9 oz), last menstrual period 11/24/2004, SpO2 99 %. General - Well nourished, well developed, in no apparent distress.  Ophthalmologic - Fundi not visualized due to eye movement.  Cardiovascular - Regular rate and rhythm with no murmur, no afib rhythm.  Mental Status -  Level of arousal and orientation to time, place, and person were intact. Language including expression, naming, repetition, comprehension was assessed and found intact. Attention span and concentration exam showed able to do backward spelling but not able to do calculation. Fund of Knowledge was assessed and was intact.  Cranial Nerves II - XII - II - Visual field intact OU. III, IV, VI - Extraocular movements intact. V - Facial sensation intact bilaterally. VII - Facial movement intact bilaterally. VIII - Hearing & vestibular intact bilaterally. X - Palate elevates symmetrically. XI - Chin turning & shoulder shrug intact bilaterally. XII - Tongue protrusion intact.  Motor Strength - The patient's strength was normal in all  extremities and pronator drift was absent. Bulk was normal and fasciculations were absent.  Motor Tone - Muscle tone was assessed at the neck and  appendages and was normal.  Reflexes - The patient's reflexes were 1+ in all extremities and she had no pathological reflexes.  Sensory - Light touch, temperature/pinprick were assessed and were symmetrical.   Coordination - The patient had normal movements in the hands and feet with no ataxia or dysmetria. Tremor was absent.  Gait and Station - deferred due to safety concerns.   Discharge Diet   Diet Heart Room service appropriate?: Yes; Fluid consistency:: Thin liquids  DISCHARGE PLAN  Disposition:  Home with husband  Outpatient PT, OT and ST  Due to hemorrhage and risk of bleeding, do not take aspirin, aspirin-containing medications, or ibuprofen products    Follow-up GRIFFIN,ELAINE COLLINS, MD in 2 weeks.  Follow-up with Dr. Andrey Spearman in 1-2 months.  Recommend CT vs MRI in 1 months for hemorrhage follow up  40 minutes were spent preparing discharge.  [place] for Pager information 12/27/2015 11:20 AM   I, the attending vascular neurologist, have personally obtained a history, examined the patient, evaluated laboratory data, individually viewed imaging studies and agree with radiology interpretations. I also discussed with pt and her husband regarding her care plan. Together with the NP/PA, we formulated the assessment and plan of care which reflects our mutual decision.  I have made any additions or clarifications directly to the above note and agree with the findings and plan as currently documented.   Rosalin Hawking, MD PhD Stroke Neurology 12/27/2015 2:34 PM

## 2016-01-02 ENCOUNTER — Ambulatory Visit: Payer: PPO

## 2016-01-02 ENCOUNTER — Ambulatory Visit: Payer: PPO | Attending: Neurology | Admitting: Physical Therapy

## 2016-01-02 ENCOUNTER — Encounter: Payer: Self-pay | Admitting: Physical Therapy

## 2016-01-02 DIAGNOSIS — R269 Unspecified abnormalities of gait and mobility: Secondary | ICD-10-CM | POA: Insufficient documentation

## 2016-01-02 DIAGNOSIS — C50911 Malignant neoplasm of unspecified site of right female breast: Secondary | ICD-10-CM | POA: Diagnosis not present

## 2016-01-02 DIAGNOSIS — R279 Unspecified lack of coordination: Secondary | ICD-10-CM | POA: Diagnosis not present

## 2016-01-02 DIAGNOSIS — M6289 Other specified disorders of muscle: Secondary | ICD-10-CM | POA: Insufficient documentation

## 2016-01-02 DIAGNOSIS — R4701 Aphasia: Secondary | ICD-10-CM

## 2016-01-02 DIAGNOSIS — I619 Nontraumatic intracerebral hemorrhage, unspecified: Secondary | ICD-10-CM | POA: Diagnosis not present

## 2016-01-02 DIAGNOSIS — R278 Other lack of coordination: Secondary | ICD-10-CM | POA: Insufficient documentation

## 2016-01-02 DIAGNOSIS — R2681 Unsteadiness on feet: Secondary | ICD-10-CM | POA: Insufficient documentation

## 2016-01-02 DIAGNOSIS — R4189 Other symptoms and signs involving cognitive functions and awareness: Secondary | ICD-10-CM | POA: Diagnosis not present

## 2016-01-02 DIAGNOSIS — I480 Paroxysmal atrial fibrillation: Secondary | ICD-10-CM | POA: Diagnosis not present

## 2016-01-02 NOTE — Therapy (Signed)
[facility] [address] [place], [place], [address] Phone: [phone]   Fax:  [phone]  Physical Therapy Evaluation  Patient Details  Name: Barbara [name] MRN: [id number] Date of Birth: Mar 12, 1951 Referring Provider: Rosalin [name]  Encounter Date: 01/02/2016      PT End of Session - 01/02/16 1535    Visit Number 1   Number of Visits 17   Date for PT Re-Evaluation 03/02/16   Authorization Type [employer]/Medicare   Authorization Time Period G codes every 10th visit   PT Start Time 1402   PT Stop Time 1450   PT Time Calculation (min) 48 min   Equipment Utilized During Treatment Gait belt   Activity Tolerance Patient tolerated treatment well   Behavior During Therapy [facility] for tasks assessed/performed      Past Medical History  Diagnosis Date  . Breast cancer ([place])   . Hashimoto's thyroiditis     with goiter  . Hypercholesteremia   . 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  Narrowing of the 4th and 5th lumbar interspaces.   Marland Kitchen Spina bifida ([place])     [place] L5  . Anxiety   . GERD (gastroesophageal reflux disease)   . Headache(784.0)     hx migraines  . Varicose vein     Past Surgical History  Procedure Laterality Date  . Hernia repair  1976    Left groin  . Tubal ligation    . Cystoscopy with urethral caruncle    . Mastectomy w/ sentinel node biopsy Bilateral 10/13/2013    Procedure:  BILATERAL MASTECTOMY WITH RIGHT SENTINEL LYMPH NODE BIOPSY;  Surgeon: Barbara Roof, MD;  Location: [place];  Service: General;  Laterality: Bilateral;  . Portacath placement Left 10/13/2013    Procedure: INSERTION PORT-A-CATH;  Surgeon: Barbara Roof, MD;  Location: [place];  Service: General;  Laterality: Left;  . Breast reconstruction with placement of tissue expander and flex hd (acellular hydrated dermis) Bilateral 10/13/2013    Procedure: PLACEMENT OF  BILATERAL TISSUE EXPANDER FOR BREAST RECONSTRUCTION ;  Surgeon: Barbara Reese, MD;  Location: [place];  Service: Plastics;  Laterality: Bilateral;  . Wisdom tooth extraction  1990  . Port-a-cath removal Left 12/21/2014    Procedure: MINOR REMOVAL OF PORT-A-CATH;  Surgeon: Barbara Messing III, MD;  Location: [place];  Service: General;  Laterality: Left;    There were no vitals filed for this visit.  Visit Diagnosis:  Abnormality of gait  Unsteadiness  Coordination impairments      Subjective Assessment - 01/02/16 1408    Subjective Pt is a 65 y.o. F who arrives today with her husband s/p R occipital intracerebral hemmorrhage, L subarachnoid hemorrhage on 12/24/15 and onset of A-fib at hospitilization. PMH includes breast CA with history of bilateral mastectomy, Hashimoto's thyroiditis, spina bifida occulta, hypercholesterolemia, and CVA in 2015. States CVA began while seated with husband and pt developed onset of vertigo, nausea, emesis.  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   Patient is accompained by: Family member  Husband   Patient Stated Goals I want  to get my gait back, and carry things, bend over, and get down so I can work in my garden again.   Currently in Pain? No/denies            [facility] PT Assessment - 01/02/16 0001    Assessment   Medical Diagnosis Hemorrhagic stroke   Referring Provider Barbara [name]   Onset Date/Surgical Date 12/24/15   Precautions   Precautions Fall   Balance Screen   Has the patient fallen in the past 6 months No   [place] residence   Living Arrangements Spouse/significant other   Available Help at Discharge  Family   Type of [place] to enter   Entrance Stairs-Number of Steps 1   Entrance Stairs-Rails None   Home Layout Two level   Alternate Level Stairs-Number of Steps 12   Alternate Level Stairs-Rails Left  both first 5 feet   Home Equipment None   Prior Function   Level of Independence Needs assistance with ADLs   Vocation Retired  Buyer, retail (bending, kneeling, 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 Difficulty with word finding, some expressive aphasia noted   Sensation   Light Touch Appears Intact   Hot/Cold Appears Intact   Coordination   Gross Motor Movements are Fluid and Coordinated No   Heel Shin Test [facility] bilaterally   ROM / Strength   AROM / PROM / Strength Strength   Strength   Overall Strength Within functional limits for tasks performed   Overall Strength Comments L hip flexion 4/5, L knee ext 4/5, L knee flexion 4/5, L ankle DF 5/5, L ankle PF 5/5, R hip flexion 4/5, R knee ext 5/5, R knee flex 4/5, R ankle DF 5/5, R ankle PF 5/5   Transfers   Transfers Sit to Stand;Stand to Sit;Stand Pivot Transfers   Sit to Stand 7: Independent   Stand to Sit 7: Independent   Stand Pivot Transfers 6: Modified independent (Device/Increase time)  increased time   Comments Pt demonstrates unsteadiness when performing stand to pivot transfer, but completes safely. Pt unable to control descent during stand to sit   Ambulation/Gait   Ambulation/Gait Yes   Ambulation/Gait Assistance 5: Supervision   Ambulation Distance (Feet) 115 Feet   Assistive device None   Gait Pattern Step-through pattern;Decreased arm swing - right;Decreased arm swing - left   Ambulation Surface Level;Indoor   Gait Comments Pt relies heavily on  visual fixation during ambulation in order to maintain steadiness/balance, demonstrates LOB with head turns during gait   Standardized Balance Assessment   Standardized Balance Assessment Berg Balance Test;Dynamic Gait Index   Berg Balance Test   Sit to Stand Able to stand without using hands and stabilize independently   Standing Unsupported Able to stand safely 2 minutes   Sitting with Back Unsupported but Feet Supported on Floor or Stool Able to sit safely and securely 2 minutes   Stand to Sit Controls descent by using hands   Transfers Able to transfer safely, minor use of hands   Standing Unsupported with Eyes Closed Able to stand 10 seconds safely   Standing Ubsupported with Feet Together Able to place feet together independently and stand 1  minute safely   From Standing, Reach Forward with Outstretched Arm Can reach confidently >25 cm (10")   From Standing Position, Pick up Object from Floor Unable to try/needs assist to keep balance  deferred   From Standing Position, Turn to Look Behind Over each Shoulder Looks behind from both sides and weight shifts well   Turn 360 Degrees Able to turn 360 degrees safely in 4 seconds or less   Standing Unsupported, Alternately Place Feet on Step/Stool Able to stand independently and complete 8 steps >20 seconds   Standing Unsupported, One Foot in Front Able to plae foot ahead of the other independently and hold 30 seconds   Standing on One Leg Able to lift leg independently and hold 5-10 seconds   Total Score 48   Dynamic Gait Index   Level Surface Mild Impairment   Change in Gait Speed Mild Impairment   Gait with Horizontal Head Turns Moderate Impairment   Gait with Vertical Head Turns Severe Impairment   Gait and Pivot Turn Mild Impairment   Step Over Obstacle Mild Impairment   Step Around Obstacles Normal   Steps Mild Impairment   Total Score 14   DGI comment: Scores of 19 or less are predicitve of falls in older community living adults.             Vestibular Assessment - 01/02/16 0001    Occulomotor Exam   Spontaneous Absent   Gaze-induced Absent   Smooth Pursuits Saccades   Comment Smooth pursuits tested binocular and monocular. Monocular testing reveals R eye saccadic with tracking to R.             PT Short Term Goals - 01/02/16 1545    PT SHORT TERM GOAL #1   Title The patient will demonstrate initiation of HEP with supervision and assistance from husband as needed to maximize functional gains made in PT.   Baseline TARGET DATE = 01/30/16   Time 4   Period Weeks   Status New   PT SHORT TERM GOAL #2   Title The patient will demonstrate ability to negotiate 12 steps with 1 handrail assistance mod I and reciprocal gait pattern in order to safely reach second floor of home for bathing.   Time 4   Period Weeks   Status New   PT SHORT TERM GOAL #3   Title The patient will ambulate 150' indoors over level surfaces at supervision level while performing horizontal head turns x2 each direction and vertical head turns x2 each direction to demonstrate improvements and safety with dynamic gait.   Time 4   Period Weeks   Status New   PT SHORT TERM GOAL #4   Title The patient will demonstrate ability to move from quadruped position to tall kneeling position independently 5x with no overt LOB to simulate gardening tasks at home.   Time 4   Period Weeks   Status New   PT SHORT TERM GOAL #5   Title The patient will improve 10 m walk test to at least 2.62 ft/sec in order to demonstrate improvent in gait speed and become safe community ambulator.   Baseline 2.54 ft/sec   Time 8   Period Weeks   Status New           PT Long Term Goals - 01/02/16 1536    PT LONG TERM GOAL #1   Title The patient will demonstrate independence and safety with HEP in order to maximize functional gains made in PT.  Baseline TARGET DATE = 03/02/16   Time 8   Period Weeks   Status New   PT LONG TERM GOAL #2   Title The patient  will return to gardening at home including kneeling, bending over, and sitting on stool in order to return to leisure activities safely and independently.   Time 8   Period Weeks   Status New   PT LONG TERM GOAL #3   Title The patient will improve DGI score to at least 19 in order to demonstrate improvements and safety with dynamic gait.   Baseline DGI = 14   Time 8   Period Weeks   Status New   PT LONG TERM GOAL #4   Title The patient will ambulate 150' outdoors over paved surfaces and grass independently in order to demonstrate independence with community ambulation and safety going outdoors to her garden.   Baseline The patient is currently unable to walk outdoors to her garden without assistance   Time 8   Period Weeks   Status New   PT LONG TERM GOAL #5   Title The patient will improve Neuro QOL LE by at least 5 points to demonstrate improved QOL and LE function.   Baseline initial = 48.9   Time 8   Period Weeks            Plan - 01/02/16 1524    Clinical Impression Statement The patient is a 65 y.o. F with past medical history of breast CA, Hashimoto's thryoiditis, hypercholesterolemia, and CVA in 2015 evaluated today s/p R occipital intracerebral hemorrhage and L subarachnoid hemorrhage. The patient presents with deficits in balance and strength, as well as difficulty with head turns during gait as she relies heavily on visual fixation for balance. These impairments are currently limiting the patient's ability to ambulate safely and independently in her home and community, negotiate stairs safely and independently, perform ADLs at her prior level, and work in her garden safely and independently at this time. Skilled PT is recommended 2x/week for 8 weeks in order to address the aforementioned impairments and return the patient to her prior level.   Pt will benefit from skilled therapeutic intervention in order to improve on the following deficits Abnormal gait;Decreased  balance;Decreased mobility;Decreased strength;Difficulty walking;Impaired vision/preception   Rehab Potential Good   Clinical Impairments Affecting Rehab Potential good support system - husband   PT Frequency 2x / week   PT Duration 8 weeks   PT Treatment/Interventions ADLs/Self Care Home Management;Biofeedback;Canalith Repostioning;Electrical Stimulation;Moist Heat;Cryotherapy;DME Instruction;Gait training;Stair training;Functional mobility training;Therapeutic activities;Therapeutic exercise;Balance training;Neuromuscular re-education;Patient/family education;Orthotic Fit/Training;Manual techniques;Passive range of motion;Vestibular;Visual/perceptual remediation/compensation   PT Next Visit Plan VOR training, x1 viewing, static balance with head turns, foam with eyes closed, narrow base, HEP   Consulted and Agree with Plan of Care Patient;Family member/caregiver   Family Member Consulted Husband       Problem List Patient Active Problem List   Diagnosis Date Noted  . ICH (intracerebral hemorrhage) ([place]) - R occipital 12/27/2015  . Atrial fibrillation with RVR ([place]) 12/27/2015  . Hyperlipidemia 12/27/2015  . Cognitive deficits 12/27/2015  . Hypothyroidism 12/27/2015  . Anxiety 12/27/2015  . Subarachnoid hemorrhage ([place]) - L frontal 12/25/2015  . Cerebellar stroke ([place]) 12/02/2014  . Osteopenia 12/01/2014  . Rhinitis, chronic 01/13/2014  . Breast cancer of upper-outer quadrant of right female breast ([place]) 09/26/2013    De Nurse, SPT 01/02/2016, 4:44 PM  [facility] [address] [place], [place], [address] Phone:  [phone]   Fax:  [phone]  Name: Barbara [name] MRN: [id number] Date of Birth: 06-02-1951

## 2016-01-02 NOTE — Therapy (Deleted)
[facility] [address] [place], [place], [address] Phone: [phone]   Fax:  [phone]  Physical Therapy Evaluation  Patient Details  Name: Barbara [name] MRN: [id number] Date of Birth: 1951-10-28 Referring Provider: Rosalin Hawking  Encounter Date: 01/02/2016      PT End of Session - 01/02/16 1535    Visit Number 1   Number of Visits 17   Date for PT Re-Evaluation 03/02/16   Authorization Type [employer]/Medicare   Authorization Time Period G codes every 10th visit   PT Start Time 1402   PT Stop Time 1450   PT Time Calculation (min) 48 min   Equipment Utilized During Treatment Gait belt   Activity Tolerance Patient tolerated treatment well   Behavior During Therapy [facility] for tasks assessed/performed      Past Medical History  Diagnosis Date  . Breast cancer ([place])   . Hashimoto's thyroiditis     with goiter  . Hypercholesteremia   . 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  Narrowing of the 4th and 5th lumbar interspaces.   Marland Kitchen Spina bifida ([place])     occutta L5  . Anxiety   . GERD (gastroesophageal reflux disease)   . Headache(784.0)     hx migraines  . Varicose vein     Past Surgical History  Procedure Laterality Date  . Hernia repair  1976    Left groin  . Tubal ligation    . Cystoscopy with urethral caruncle    . Mastectomy w/ sentinel node biopsy Bilateral 10/13/2013    Procedure:  BILATERAL MASTECTOMY WITH RIGHT SENTINEL LYMPH NODE BIOPSY;  Surgeon: Merrie Roof, MD;  Location: [place];  Service: General;  Laterality: Bilateral;  . Portacath placement Left 10/13/2013    Procedure: INSERTION PORT-A-CATH;  Surgeon: Merrie Roof, MD;  Location: [place];  Service: General;  Laterality: Left;  . Breast reconstruction with placement of tissue expander and flex hd (acellular hydrated dermis) Bilateral 10/13/2013    Procedure: PLACEMENT OF  BILATERAL TISSUE EXPANDER FOR BREAST RECONSTRUCTION ;  Surgeon: Crissie Reese, MD;  Location: [place];  Service: Plastics;  Laterality: Bilateral;  . Wisdom tooth extraction  1990  . Port-a-cath removal Left 12/21/2014    Procedure: MINOR REMOVAL OF PORT-A-CATH;  Surgeon: Autumn Messing III, MD;  Location: [place];  Service: General;  Laterality: Left;    There were no vitals filed for this visit.  Visit Diagnosis:  Abnormality of gait - Plan: PT plan of care cert/re-cert  Unsteadiness - Plan: PT plan of care cert/re-cert  Coordination impairments - Plan: PT plan of care cert/re-cert      Subjective Assessment - 01/02/16 1408    Subjective Pt is a 65 y.o. F who arrives today with her husband s/p R occipital intracerebral hemmorrhage, L subarachnoid hemorrhage on 12/24/15 and onset of A-fib at hospitilization. PMH includes breast CA with history of bilateral mastectomy, Hashimoto's thyroiditis, spina bifida occulta, hypercholesterolemia, and CVA in 2015. States CVA began while seated with husband and pt developed onset of vertigo, nausea, emesis.  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   Patient is accompained by: Family member  Husband   Patient Stated Goals I want to get my gait back, and carry things, bend over, and get down so I can work in my garden again.   Currently in Pain? No/denies            [facility] PT Assessment - 01/02/16 0001    Assessment   Medical Diagnosis Hemorrhagic stroke   Referring Provider Rosalin Hawking   Onset Date/Surgical Date 12/24/15   Precautions   Precautions Fall   Balance Screen   Has the patient fallen in the past 6 months No   [place] residence   Living Arrangements Spouse/significant other   Available Help at Discharge Family   Type of [place] to enter   Entrance Stairs-Number of Steps 1   Entrance Stairs-Rails None   Home Layout Two level   Alternate Level Stairs-Number of Steps 12   Alternate Level Stairs-Rails Left  both first 5 feet   Home Equipment None   Prior Function   Level of Independence Needs assistance with ADLs   Vocation Retired  Buyer, retail (bending, kneeling, 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 Difficulty with word finding, some expressive aphasia noted   Sensation   Light Touch Appears Intact   Hot/Cold Appears Intact   Coordination   Gross Motor Movements are Fluid and Coordinated No   Heel Shin Test [employer] bilaterally   ROM / Strength   AROM / PROM / Strength Strength   Strength   Overall Strength Within functional limits for tasks performed   Overall Strength Comments L hip flexion 4/5, L knee ext 4/5, L knee flexion 4/5, L ankle DF 5/5, L ankle PF 5/5, R hip flexion 4/5, R knee ext 5/5, R knee flex 4/5, R ankle DF 5/5, R ankle PF 5/5   Transfers   Transfers Sit to Stand;Stand to Sit;Stand Pivot Transfers   Sit to Stand 7: Independent   Stand to Sit 7: Independent   Stand Pivot Transfers 6: Modified independent (Device/Increase time)  increased time   Comments Pt demonstrates unsteadiness when performing stand to pivot transfer, but completes safely. Pt unable to control descent during stand to sit   Ambulation/Gait   Ambulation/Gait Yes   Ambulation/Gait Assistance 5: Supervision   Ambulation Distance (Feet) 115 Feet   Assistive device None   Gait Pattern Step-through pattern;Decreased arm swing -  right;Decreased arm swing - left   Ambulation Surface Level;Indoor   Gait Comments Pt relies heavily on visual fixation during ambulation in order to maintain steadiness/balance, demonstrates LOB with head turns during gait   Standardized Balance Assessment   Standardized Balance Assessment Berg Balance Test;Dynamic Gait Index   Berg Balance Test   Sit to Stand Able to stand without using hands and stabilize independently   Standing Unsupported Able to stand safely 2 minutes   Sitting with Back Unsupported but Feet Supported on Floor or Stool Able to sit safely and securely 2 minutes   Stand to Sit Controls descent by using hands   Transfers Able to transfer safely, minor use of hands   Standing Unsupported with Eyes Closed Able  to stand 10 seconds safely   Standing Ubsupported with Feet Together Able to place feet together independently and stand 1 minute safely   From Standing, Reach Forward with Outstretched Arm Can reach confidently >25 cm (10")   From Standing Position, Pick up Object from Floor Unable to try/needs assist to keep balance  deferred   From Standing Position, Turn to Look Behind Over each Shoulder Looks behind from both sides and weight shifts well   Turn 360 Degrees Able to turn 360 degrees safely in 4 seconds or less   Standing Unsupported, Alternately Place Feet on Step/Stool Able to stand independently and complete 8 steps >20 seconds   Standing Unsupported, One Foot in Front Able to plae foot ahead of the other independently and hold 30 seconds   Standing on One Leg Able to lift leg independently and hold 5-10 seconds   Total Score 48   Dynamic Gait Index   Level Surface Mild Impairment   Change in Gait Speed Mild Impairment   Gait with Horizontal Head Turns Moderate Impairment   Gait with Vertical Head Turns Severe Impairment   Gait and Pivot Turn Mild Impairment   Step Over Obstacle Mild Impairment   Step Around Obstacles Normal   Steps Mild Impairment   Total  Score 14   DGI comment: Scores of 19 or less are predicitve of falls in older community living adults.            Vestibular Assessment - 01/02/16 0001    Occulomotor Exam   Spontaneous Absent   Gaze-induced Absent   Smooth Pursuits Saccades   Comment Smooth pursuits tested binocular and monocular. Monocular testing reveals R eye saccadic with tracking to R.                          PT Short Term Goals - 01/02/16 1545    PT SHORT TERM GOAL #1   Title The patient will demonstrate initiation of HEP with supervision and assistance from husband as needed to maximize functional gains made in PT.   Baseline TARGET DATE = 01/30/16   Time 4   Period Weeks   Status New   PT SHORT TERM GOAL #2   Title The patient will demonstrate ability to negotiate 12 steps with 1 handrail assistance mod I and reciprocal gait pattern in order to safely reach second floor of home for bathing.   Time 4   Period Weeks   Status New   PT SHORT TERM GOAL #3   Title The patient will ambulate 150' indoors over level surfaces at supervision level while performing horizontal head turns x2 each direction and vertical head turns x2 each direction to demonstrate improvements and safety with dynamic gait.   Time 4   Period Weeks   Status New   PT SHORT TERM GOAL #4   Title The patient will demonstrate ability to move from quadruped position to tall kneeling position independently 5x with no overt LOB to simulate gardening tasks at home.   Time 4   Period Weeks   Status New   PT SHORT TERM GOAL #5   Title The patient will improve 10 m walk test to at least 2.62 ft/sec in order to demonstrate improvent in gait speed and become safe community ambulator.   Baseline 2.54 ft/sec   Time 8   Period Weeks   Status New           PT Long  Term Goals - 01/24/2016 1536    PT LONG TERM GOAL #1   Title The patient will demonstrate independence and safety with HEP in order to maximize functional gains  made in PT.   Baseline TARGET DATE = 03/02/16   Time 8   Period Weeks   Status New   PT LONG TERM GOAL #2   Title The patient will return to gardening at home including kneeling, bending over, and sitting on stool in order to return to leisure activities safely and independently.   Time 8   Period Weeks   Status New   PT LONG TERM GOAL #3   Title The patient will improve DGI score to at least 19 in order to demonstrate improvements and safety with dynamic gait.   Baseline DGI = 14   Time 8   Period Weeks   Status New   PT LONG TERM GOAL #4   Title The patient will ambulate 150' outdoors over paved surfaces and grass independently in order to demonstrate independence with community ambulation and safety going outdoors to her garden.   Baseline The patient is currently unable to walk outdoors to her garden without assistance   Time 8   Period Weeks   Status New   PT LONG TERM GOAL #5   Title The patient will improve Neuro QOL LE by at least 5 points to demonstrate improved QOL and LE function.   Baseline initial = 48.9   Time 8   Period Weeks               Plan - January 24, 2016 1524    Clinical Impression Statement The patient is a 65 y.o. F with past medical history of breast CA, Hashimoto's thryoiditis, hypercholesterolemia, and CVA in 2015 evaluated today s/p R occipital intracerebral hemorrhage and L subarachnoid hemorrhage. The patient presents with deficits in balance and strength, as well as difficulty with head turns during gait as she relies heavily on visual fixation for balance. These impairments are currently limiting the patient's ability to ambulate safely and independently in her home and community, negotiate stairs safely and independently, perform ADLs at her prior level, and work in her garden safely and independently at this time. Skilled PT is recommended 2x/week for 8 weeks in order to address the aforementioned impairments and return the patient to her prior level.    Pt will benefit from skilled therapeutic intervention in order to improve on the following deficits Abnormal gait;Decreased balance;Decreased mobility;Decreased strength;Difficulty walking;Impaired vision/preception   Rehab Potential Good   Clinical Impairments Affecting Rehab Potential good support system - husband   PT Frequency 2x / week   PT Duration 8 weeks   PT Treatment/Interventions ADLs/Self Care Home Management;Biofeedback;Canalith Repostioning;Electrical Stimulation;Moist Heat;Cryotherapy;DME Instruction;Gait training;Stair training;Functional mobility training;Therapeutic activities;Therapeutic exercise;Balance training;Neuromuscular re-education;Patient/family education;Orthotic Fit/Training;Manual techniques;Passive range of motion;Vestibular;Visual/perceptual remediation/compensation   PT Next Visit Plan VOR training, x1 viewing, static balance with head turns, foam with eyes closed, narrow base, HEP   Consulted and Agree with Plan of Care Patient;Family member/caregiver   Family Member Consulted Husband          G-Codes - 24-Jan-2016 1644    Functional Assessment Tool Used DGI 14/24   Functional Limitation Mobility: Walking and moving around   Mobility: Walking and Moving Around Current Status [phone]) At least 40 percent but less than 60 percent impaired, limited or restricted   Mobility: Walking and Moving Around Goal Status [id number]) At least 20 percent but less than 40 percent impaired, limited  or restricted       Problem List Patient Active Problem List   Diagnosis Date Noted  . ICH (intracerebral hemorrhage) ([place]) - R occipital 12/27/2015  . Atrial fibrillation with RVR ([place]) 12/27/2015  . Hyperlipidemia 12/27/2015  . Cognitive deficits 12/27/2015  . Hypothyroidism 12/27/2015  . Anxiety 12/27/2015  . Subarachnoid hemorrhage ([place]) - L frontal 12/25/2015  . Cerebellar stroke ([place]) 12/02/2014  . Osteopenia 12/01/2014  . Rhinitis, chronic 01/13/2014  . Breast cancer  of upper-outer quadrant of right female breast ([place]) 09/26/2013    Billie Ruddy, PT, [facility] [address] [place], [place], [address] Phone: [phone]   Fax:  [phone] 01/02/2016, 4:49 PM  Name: Barbara [name] MRN: [id number] Date of Birth: 1951/10/19

## 2016-01-03 ENCOUNTER — Ambulatory Visit: Payer: PPO | Admitting: Occupational Therapy

## 2016-01-03 DIAGNOSIS — R279 Unspecified lack of coordination: Secondary | ICD-10-CM

## 2016-01-03 DIAGNOSIS — R29898 Other symptoms and signs involving the musculoskeletal system: Secondary | ICD-10-CM

## 2016-01-03 DIAGNOSIS — R4189 Other symptoms and signs involving cognitive functions and awareness: Secondary | ICD-10-CM

## 2016-01-03 DIAGNOSIS — R278 Other lack of coordination: Secondary | ICD-10-CM

## 2016-01-03 DIAGNOSIS — R269 Unspecified abnormalities of gait and mobility: Secondary | ICD-10-CM | POA: Diagnosis not present

## 2016-01-03 DIAGNOSIS — R2681 Unsteadiness on feet: Secondary | ICD-10-CM

## 2016-01-03 NOTE — Therapy (Signed)
[facility] [address] [place], [place], [address] Phone: [phone]   Fax:  [phone]  Speech Language Pathology Evaluation  Patient Details  Name: Barbara [name] MRN: [id number] Date of Birth: [birth year] No Data Recorded  Encounter Date: 01/02/2016      End of Session - 01/02/16 1706    Visit Number 1   Number of Visits 17   Date for SLP Re-Evaluation 03/03/16   SLP Start Time 1533   SLP Stop Time  1616   SLP Time Calculation (min) 43 min   Activity Tolerance Patient tolerated treatment well      Past Medical History  Diagnosis Date  . Breast cancer ([place])   . Hashimoto's thyroiditis     with goiter  . Hypercholesteremia   . 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  Narrowing of the 4th and 5th lumbar interspaces.   Marland Kitchen Spina bifida ([place])     occutta L5  . Anxiety   . GERD (gastroesophageal reflux disease)   . Headache(784.0)     hx migraines  . Varicose vein     Past Surgical History  Procedure Laterality Date  . Hernia repair  1976    Left groin  . Tubal ligation    . Cystoscopy with urethral caruncle    . Mastectomy w/ sentinel node biopsy Bilateral 10/13/2013    Procedure:  BILATERAL MASTECTOMY WITH RIGHT SENTINEL LYMPH NODE BIOPSY;  Surgeon: Merrie Roof, MD;  Location: [place];  Service: General;  Laterality: Bilateral;  . Portacath placement Left 10/13/2013    Procedure: INSERTION PORT-A-CATH;  Surgeon: Merrie Roof, MD;  Location: [place];  Service: General;  Laterality: Left;  . Breast reconstruction with placement of tissue expander and flex hd (acellular hydrated dermis) Bilateral 10/13/2013    Procedure: PLACEMENT OF BILATERAL TISSUE EXPANDER FOR BREAST RECONSTRUCTION ;  Surgeon: Crissie Reese, MD;  Location: [place];  Service: Plastics;  Laterality: Bilateral;  . Wisdom tooth extraction  1990  . Port-a-cath removal Left 12/21/2014     Procedure: MINOR REMOVAL OF PORT-A-CATH;  Surgeon: Autumn Messing III, MD;  Location: [place];  Service: General;  Laterality: Left;    There were no vitals filed for this visit.  Visit Diagnosis: Aphasia      Subjective Assessment - 01/02/16 1455    Subjective "Let me think of the word..."    Currently in Pain? No/denies            SLP Evaluation [facility] - 01/02/16 1455    Prior Functional Status   Cognitive/Linguistic Baseline Within functional limits   Type of Home House    Lives With Spouse   Auditory Comprehension   Overall Auditory Comprehension Impaired   Conversation Moderately complex   EffectiveTechniques Extra processing time;Slowed speech   Overall Auditory Comprehension Comments Difficult to fully assess if receptive aphasia or attention deficit leading to reduced comprehension. SLP had to explain questions to pt occasionally, rephrasing to attempt to incr pt understanding, mostly successful.    Verbal Expression   Overall Verbal Expression Impaired   Automatic Speech --  WNL   Level of Impairment Sentence level   Interfering Components Premorbid deficit   Other Verbal Expression Comments Pt with obvious paraphasias in conversation, preomrbid status but no therapy completed to date for aphasia.    Oral Motor/Sensory Function   Overall Oral Motor/Sensory Function Appears  within functional limits for tasks assessed   Motor Speech   Overall Motor Speech Appears within functional limits for tasks assessed   Standardized Assessments   Standardized Assessments  Montreal Cognitive Assessment ([place])   Montreal Cognitive Assessment ([place])  25/30 - same score as in hospital 12/26/15                         SLP Education - 10-Jan-2016 1706    Education provided Yes   Education Details eval results and therapy course   Person(s) Educated Patient;Spouse   Methods Explanation   Comprehension Verbalized understanding;Need further instruction   (pt)          SLP Short Term Goals - 2016/01/10 1710    SLP SHORT TERM GOAL #1   Title pt will tell SLP 3 compensations for anomia/dysnomia   Time 4   Period Weeks   Status New   SLP SHORT TERM GOAL #2   Title pt will demo emergent awareness of aphasic errors 90% of the time   Time 4   Period Weeks   Status New   SLP SHORT TERM GOAL #3   Title pt will demo understanding of 10 minute simple to mod-complex conversation with pt requests for repeats allowed   Time 4   Period Weeks   Status New          SLP Long Term Goals - Jan 10, 2016 1712    SLP LONG TERM GOAL #1   Title pt will demo understanding of 10 minutes mod complex/complex conversation with pt requests for repeats allowed   Time 8   Period Weeks   Status New   SLP LONG TERM GOAL #2   Title pt will demo compensations to generate 10 minutes WFL mod complex/complex conversation   Time 8   Period Weeks   Status New          Plan - 01/10/2016 1707    Clinical Impression Statement Pt presents with min-mod expressive aphasia depending on conversational topic, and likely mild receptive aphasia, having occurred following a CVA last year. However, no formal ST has been completed for this deficit to date. Pt would benefit fromskilled ST to maximize receptive and expressive aphasia. Likely some cognitive deficits as well however SLP to focus on aphasia. Pt/family agree with this plan.   Speech Therapy Frequency 2x / week   Duration --  8 weeks   Treatment/Interventions SLP instruction and feedback;Compensatory strategies;Internal/external aids;Patient/family education;Functional tasks;Language facilitation;Cueing hierarchy   Potential to Achieve Goals Good   Potential Considerations Other (comment)  time post onset   Consulted and Agree with Plan of Care Patient;Family member/caregiver   Family Member Consulted husband          G-Codes - 01-10-16 1714    Functional Assessment Tool Used noms - approx 30% impaired    Functional Limitations Spoken language expressive   Spoken Language Expression Current Status [id number]) At least 20 percent but less than 40 percent impaired, limited or restricted   Spoken Language Expression Goal Status [id number]) At least 1 percent but less than 20 percent impaired, limited or restricted      Problem List Patient Active Problem List   Diagnosis Date Noted  . ICH (intracerebral hemorrhage) ([place]) - R occipital 12/27/2015  . Atrial fibrillation with RVR ([place]) 12/27/2015  . Hyperlipidemia 12/27/2015  . Cognitive deficits 12/27/2015  . Hypothyroidism 12/27/2015  . Anxiety 12/27/2015  . Subarachnoid hemorrhage ([place]) - L frontal 12/25/2015  .  Cerebellar stroke ([place]) 12/02/2014  . Osteopenia 12/01/2014  . Rhinitis, chronic 01/13/2014  . Breast cancer of upper-outer quadrant of right female breast ([place]) 09/26/2013    [facility], [place] ,[place], [place]  01/03/2016, 5:16 PM  [facility] [address] [place], [place], [address] Phone: [phone]   Fax:  [phone]  Name: Barbara [name] MRN: [id number] Date of Birth: 11-05-1951

## 2016-01-03 NOTE — Therapy (Signed)
[facility] [address] [place], [place], [address] Phone: [phone]   Fax:  [phone]  Occupational Therapy Evaluation  Patient Details  Name: Barbara [name] MRN: [id number] Date of Birth: Mar 11, 1951 Referring Provider: Dr. Rosalin Hawking  Encounter Date: 01/03/2016      OT End of Session - 01/03/16 1354    Visit Number 1   Number of Visits 17   Date for OT Re-Evaluation 03/02/16   Authorization Type [employer], G-code needed   Authorization - Visit Number 1   Authorization - Number of Visits 10   OT Start Time [phone]   OT Stop Time 1017   OT Time Calculation (min) 39 min   Activity Tolerance Patient tolerated treatment well   Behavior During Therapy --  slightly impulsive at times      Past Medical History  Diagnosis Date  . Breast cancer ([place])   . Hashimoto's thyroiditis     with goiter  . Hypercholesteremia   . 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  Narrowing of the 4th and 5th lumbar interspaces.   Marland Kitchen Spina bifida ([place])     occutta L5  . Anxiety   . GERD (gastroesophageal reflux disease)   . Headache(784.0)     hx migraines  . Varicose vein     Past Surgical History  Procedure Laterality Date  . Hernia repair  1976    Left groin  . Tubal ligation    . Cystoscopy with urethral caruncle    . Mastectomy w/ sentinel node biopsy Bilateral 10/13/2013    Procedure:  BILATERAL MASTECTOMY WITH RIGHT SENTINEL LYMPH NODE BIOPSY;  Surgeon: Merrie Roof, MD;  Location: [facility];  Service: General;  Laterality: Bilateral;  . Portacath placement Left 10/13/2013    Procedure: INSERTION PORT-A-CATH;  Surgeon: Merrie Roof, MD;  Location: [facility];  Service: General;  Laterality: Left;  . Breast reconstruction with placement of tissue expander and flex hd (acellular hydrated dermis) Bilateral 10/13/2013    Procedure: PLACEMENT OF BILATERAL TISSUE  EXPANDER FOR BREAST RECONSTRUCTION ;  Surgeon: Crissie Reese, MD;  Location: [facility];  Service: Plastics;  Laterality: Bilateral;  . Wisdom tooth extraction  1990  . Port-a-cath removal Left 12/21/2014    Procedure: MINOR REMOVAL OF PORT-A-CATH;  Surgeon: Autumn Messing III, MD;  Location: [facility];  Service: General;  Laterality: Left;    There were no vitals filed for this visit.  Visit Diagnosis:  Weakness of left hand  Decreased coordination  Cognitive deficits  Unsteadiness      Subjective Assessment - 01/03/16 0941    Subjective  Pt reports that her balance is worse now   Pertinent History hx of breast CA (2014) with chemo for 1 year and bilateral mastectomy; hx of word finding and concentration difficulties which improved after completing chemo; a-fib, hx of small punctate infarct L erebellum vs. demyelinating disease   Limitations receptive aphasia   Patient Stated Goals improve balance, be able to garden   Currently in Pain? No/denies           [facility] OT Assessment - 01/03/16 0001    Assessment   Diagnosis hemorrhagic stroke   Referring Provider Dr. Rosalin Hawking   Onset Date 12/24/15   Prior Therapy hospitalized 12/24/15-12/28/15   Precautions   Precautions Fall  no driving   Balance Screen   Has the patient  fallen in the past 6 months No   Prior Function   Level of Independence Independent with basic ADLs;Independent with homemaking with ambulation;Independent with community mobility without device   Vocation Retired   Biomedical scientist worked as Corporate treasurer at [employer] (bending, kneeling, stool she sits on), church, walk, journal   ADL   Eating/Feeding Armed forces operational officer Supervision/safety   IADL   Prior Level of  Function Shopping performed with husband   Shopping --  not performed yet   Prior Level of Function Light Housekeeping pt did most cleaning   Light Housekeeping --  not preforming currently   Prior Level of Function Meal Prep husband performed previously   Meal Prep --  perform snack prep, cold prep (husband put items in oven)   Prior Level of Function Community Mobility independent   [place] on family or friends for transportation   Medication Management Is responsible for taking medication in correct dosages at correct time  husband helps remind pt   Prior Level of Function Financial Management independent    Financial Management --  husband performs currently   Vision - History   Baseline Vision Bifocals   Vision Assessment   Eye Alignment Within Functional Limits   Ocular Range of Motion Within Functional Limits   Tracking/Visual Pursuits Able to track stimulus in all quads without difficulty  with both eyes and no c/o diplopia   Convergence Within functional limits   Visual Fields No apparent deficits   Comment pt denies visual changes.  Visuospatial/Executive function section of MOCA WNL   Cognition   Overall Cognitive Status Impaired/Different from baseline  per pt/husband no significant changes     hx of word finding, concentration difficulties that improved after completion of chemo per medical records   Area of Impairment Awareness;Problem solving   Awareness Intellectual   Awareness Comments Pt only able to identify balance changes    Problem Solving Slow processing   Problem Solving Comments Pt needed min cueing for set-up of calculations for checkbook balancing activity and made 1 math error with min cueing to correct.  Pt also with incr time and difficulty completing   Executive Function Sequencing  Trail Making Test Part B accurately with min incr time   Behaviors Impulsive  somewhat impulsive at times   Sensation   Light Touch Appears Intact    Additional Comments denies changes per pt   Coordination   9 Hole Peg Test Right;Left   Right 9 Hole Peg Test 17.84sec   Left 9 Hole Peg Test 25.42sec   ROM / Strength   AROM / PROM / Strength AROM;Strength   AROM   Overall AROM  Within functional limits for tasks performed   Strength   Overall Strength Within functional limits for tasks performed   Overall Strength Comments proximal UE strength grossly 5/5    Hand Function   Right Hand Grip (lbs) 60lbs   Left Hand Grip (lbs) 47lbs                         OT Education - 01/03/16 1333    Education provided Yes   Education Details Eval results and OT POC   Person(s) Educated Patient;Spouse   Methods  Explanation   Comprehension Verbalized understanding          OT Short Term Goals - 01/03/16 2212    OT SHORT TERM GOAL #1   Title Pt will be independent with HEP for L hand coordination and strength.--check STGs 01/31/16   Time 4   Period Weeks   Status New   OT SHORT TERM GOAL #2   Title Pt will perform simple home maintenance tasks with no LOB.   Time 4   Period Weeks   Status New   OT SHORT TERM GOAL #3   Title Pt will perform simple cooking task with written directions mod I.   Time 4   Period Weeks   Status New   OT SHORT TERM GOAL #4   Title Pt will perform simple financial management tasks without cueing with at least 95% accuracy.           OT Long Term Goals - 01/03/16 2202    OT LONG TERM GOAL #1   Title Pt will perform mod complex home maintenance tasks mod I.--check LTGs 03/02/16   Time 8   Period Weeks   Status New   OT LONG TERM GOAL #2   Title Pt will perform simple-mod complex financial management tasks with at least 95% accuracy.   Time 8   Period Weeks   Status New   OT LONG TERM GOAL #3   Title Pt will be able to cook 2 items simultaneously safely and demonstrating good planning/divided attention.   Time 8   Period Weeks   Status New   OT LONG TERM GOAL #4   Title Pt  will improve L grip strength by at least 5lbs to assist in opening containers/lifting objects.   Baseline R-60lbs, L-47lbs   Time 8   Period Weeks   Status New   OT LONG TERM GOAL #5   Title Pt will improve coordination for ADLs as shown by improving time on 9-hole peg test by at least 5sec with L hand.   Baseline R-17.84sec, L-25.42sec   Time 8   Period Weeks   Status New               Plan - 01/03/16 1357    Clinical Impression Statement Pt is a 65 y.o. female with hx of hx of breast CA (2014) with chemo for 1 year and bilateral mastectomy; hx of word finding and concentration difficulties which improved after completing chemo; a-fib, hx of small punctate infarct L erebellum vs. demyelinating disease.  Pt with recent hospitalization for acute R occipital ICH.  Pt presents with mild decr L hand strength/coordination, decr balance for ADLs/IADLs, and mild cognitive deficits.   Pt would benefit from occupational therapy to address these deficits so that pt can resume previous ADLs/IADLs independently for improved quality of life.   Pt will benefit from skilled therapeutic intervention in order to improve on the following deficits (Retired) Decreased cognition;Decreased strength;Decreased mobility;Decreased balance;Decreased knowledge of use of DME;Impaired UE functional use;Decreased coordination   Rehab Potential Good   Clinical Impairments Affecting Rehab Potential ?awareness   OT Frequency 2x / week   OT Duration 8 weeks  +eval   OT Treatment/Interventions Self-care/ADL training;Therapeutic exercise;Functional Mobility Training;Patient/family education;Neuromuscular education;Manual Therapy;Therapeutic exercises;Splinting;Energy conservation;Cryotherapy;DME and/or AE instruction;Therapeutic activities;Cognitive remediation/compensation;Moist Heat;Electrical Stimulation;Fluidtherapy;Passive range of motion   Plan initiate L hand coordination/strength HEP   Consulted and Agree with  Plan of Care Patient          G-Codes -  01/03/16 1716    Functional Assessment Tool Used not currently performing IADLs (cooking, cleaning, financial management tasks)   Functional Limitation Self care   Self Care Current Status [phone]) At least 40 percent but less than 60 percent impaired, limited or restricted   Self Care Goal Status [id number]) At least 1 percent but less than 20 percent impaired, limited or restricted      Problem List Patient Active Problem List   Diagnosis Date Noted  . ICH (intracerebral hemorrhage) ([place]) - R occipital 12/27/2015  . Atrial fibrillation with RVR ([place]) 12/27/2015  . Hyperlipidemia 12/27/2015  . Cognitive deficits 12/27/2015  . Hypothyroidism 12/27/2015  . Anxiety 12/27/2015  . Subarachnoid hemorrhage ([place]) - L frontal 12/25/2015  . Cerebellar stroke ([place]) 12/02/2014  . Osteopenia 12/01/2014  . Rhinitis, chronic 01/13/2014  . Breast cancer of upper-outer quadrant of right female breast [facility]) 09/26/2013    [facility] 01/03/2016, 10:31 PM  [facility] [address] [place], [place], [address] Phone: [phone]   Fax:  [phone]  Name: Barbara [name] MRN: [id number] Date of Birth: Jul 22, 1951  Vianne Bulls, OTR/L 01/03/2016 10:32 PM

## 2016-01-03 NOTE — Patient Instructions (Signed)
We will work on your aphasia, both talking and likely understanding also.

## 2016-01-10 ENCOUNTER — Ambulatory Visit: Payer: PPO | Admitting: Rehabilitative and Restorative Service Providers"

## 2016-01-10 ENCOUNTER — Ambulatory Visit: Payer: PPO | Admitting: Occupational Therapy

## 2016-01-10 DIAGNOSIS — R4189 Other symptoms and signs involving cognitive functions and awareness: Secondary | ICD-10-CM

## 2016-01-10 DIAGNOSIS — R269 Unspecified abnormalities of gait and mobility: Secondary | ICD-10-CM

## 2016-01-10 DIAGNOSIS — R278 Other lack of coordination: Secondary | ICD-10-CM

## 2016-01-10 DIAGNOSIS — R2681 Unsteadiness on feet: Secondary | ICD-10-CM

## 2016-01-10 DIAGNOSIS — R279 Unspecified lack of coordination: Secondary | ICD-10-CM

## 2016-01-10 DIAGNOSIS — R29898 Other symptoms and signs involving the musculoskeletal system: Secondary | ICD-10-CM

## 2016-01-10 NOTE — Patient Instructions (Signed)
  Coordination Activities  Perform the following activities for 10-20 minutes 1 times per day with left hand(s).   Rotate ball in fingertips (clockwise and counter-clockwise).  Toss ball between hands.  Toss ball in air and catch with the same hand.  Flip cards 1 at a time as fast as you can.  Deal cards with your thumb (Hold deck in hand and push card off top with thumb).  Rotate card in hand (clockwise and counter-clockwise).  Pick up coins and stack.  Pick up coins one at a time until you get 5-10 in your hand, then move coins from palm to fingertips to stack one at a time.   1. Grip Strengthening (Resistive Putty)   Squeeze putty using thumb and all fingers. Repeat _20___ times. Do __2__ sessions per day.   2. Roll putty into tube on table and pinch between each finger and thumb x 10 reps each. (can do ring and small finger together)     Copyright  VHI. All rights reserved.

## 2016-01-10 NOTE — Patient Instructions (Signed)
Feet Together (Compliant Surface) Head Motion - Eyes Open    With eyes open, standing on compliant surface: ____30 sec____, feet together, move head slowly: up and down. Repeat __3__ times per session. Do _2___ sessions per day.  Copyright  VHI. All rights reserved.  Feet Together, Varied Arm Positions - Eyes Open    With eyes open, feet together, arms out, look straight ahead at a stationary object. Hold __30__ seconds. Repeat __3__ times per session. Do _2___ sessions per day.  Copyright  VHI. All rights reserved.  Feet Together, Varied Arm Positions - Eyes Closed    Stand with feet together and arms out. Close eyes and visualize upright position. Hold __30__ seconds. Repeat __3__ times per session. Do __2__ sessions per day.  Copyright  VHI. All rights reserved.

## 2016-01-10 NOTE — Therapy (Signed)
[facility] [address] [place], [place], [address] Phone: [phone]   Fax:  [phone]  Occupational Therapy Treatment  Patient Details  Name: Barbara [name] MRN: [id number] Date of Birth: 16-Aug-1951 Referring Provider: Dr. Rosalin Hawking  Encounter Date: 01/10/2016      OT End of Session - 01/10/16 1954    Visit Number 2   Number of Visits 17   Date for OT Re-Evaluation 03/02/16   Authorization Type [employer], G-code needed   Authorization - Visit Number 2   Authorization - Number of Visits 10   OT Start Time 1407   OT Stop Time 1448   OT Time Calculation (min) 41 min   Activity Tolerance Patient tolerated treatment well   Behavior During Therapy [facility] for tasks assessed/performed      Past Medical History  Diagnosis Date  . Breast cancer ([place])   . Hashimoto's thyroiditis     with goiter  . Hypercholesteremia   . 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  Narrowing of the 4th and 5th lumbar interspaces.   Marland Kitchen Spina bifida ([place])     occutta L5  . Anxiety   . GERD (gastroesophageal reflux disease)   . Headache(784.0)     hx migraines  . Varicose vein     Past Surgical History  Procedure Laterality Date  . Hernia repair  1976    Left groin  . Tubal ligation    . Cystoscopy with urethral caruncle    . Mastectomy w/ sentinel node biopsy Bilateral 10/13/2013    Procedure:  BILATERAL MASTECTOMY WITH RIGHT SENTINEL LYMPH NODE BIOPSY;  Surgeon: Merrie Roof, MD;  Location: [facility];  Service: General;  Laterality: Bilateral;  . Portacath placement Left 10/13/2013    Procedure: INSERTION PORT-A-CATH;  Surgeon: Merrie Roof, MD;  Location: [place];  Service: General;  Laterality: Left;  . Breast reconstruction with placement of tissue expander and flex hd (acellular hydrated dermis) Bilateral 10/13/2013    Procedure: PLACEMENT OF BILATERAL TISSUE  EXPANDER FOR BREAST RECONSTRUCTION ;  Surgeon: Crissie Reese, MD;  Location: [facility];  Service: Plastics;  Laterality: Bilateral;  . Wisdom tooth extraction  1990  . Port-a-cath removal Left 12/21/2014    Procedure: MINOR REMOVAL OF PORT-A-CATH;  Surgeon: Autumn Messing III, MD;  Location: [facility];  Service: General;  Laterality: Left;    There were no vitals filed for this visit.  Visit Diagnosis:  Decreased coordination  Weakness of left hand  Cognitive deficits     Treatment: Pt was educated in HEP for coordination/ grip strength,. See pt instructions. Basic change making worksheet with 10 items, increased time required pt performed without a calculator with  100% accuracy.                       OT Education - 01/10/16 1955    Education provided Yes   Education Details red putty/ coordination   Person(s) Educated Patient;Spouse   Methods Explanation;Demonstration;Verbal cues;Handout   Comprehension Verbalized understanding;Returned demonstration          OT Short Term Goals - 01/03/16 2212    OT SHORT TERM GOAL #1   Title Pt will be independent with HEP for L hand coordination and strength.--check STGs 01/31/16   Time 4   Period Weeks   Status New   OT SHORT TERM GOAL #  2   Title Pt will perform simple home maintenance tasks with no LOB.   Time 4   Period Weeks   Status New   OT SHORT TERM GOAL #3   Title Pt will perform simple cooking task with written directions mod I.   Time 4   Period Weeks   Status New   OT SHORT TERM GOAL #4   Title Pt will perform simple financial management tasks without cueing with at least 95% accuracy.           OT Long Term Goals - 01/03/16 2202    OT LONG TERM GOAL #1   Title Pt will perform mod complex home maintenance tasks mod I.--check LTGs 03/02/16   Time 8   Period Weeks   Status New   OT LONG TERM GOAL #2   Title Pt will perform simple-mod complex financial management tasks with at least 95%  accuracy.   Time 8   Period Weeks   Status New   OT LONG TERM GOAL #3   Title Pt will be able to cook 2 items simultaneously safely and demonstrating good planning/divided attention.   Time 8   Period Weeks   Status New   OT LONG TERM GOAL #4   Title Pt will improve L grip strength by at least 5lbs to assist in opening containers/lifting objects.   Baseline R-60lbs, L-47lbs   Time 8   Period Weeks   Status New   OT LONG TERM GOAL #5   Title Pt will improve coordination for ADLs as shown by improving time on 9-hole peg test by at least 5sec with L hand.   Baseline R-17.84sec, L-25.42sec   Time 8   Period Weeks   Status New               Plan - 01/10/16 1436    Clinical Impression Statement Pt is progressing towards goals with improving coordination in LUE. Pt demonstrates understanding of HEP.   Pt will benefit from skilled therapeutic intervention in order to improve on the following deficits (Retired) Decreased cognition;Decreased strength;Decreased mobility;Decreased balance;Decreased knowledge of use of DME;Impaired UE functional use;Decreased coordination   Rehab Potential Good   Clinical Impairments Affecting Rehab Potential ?awareness   OT Frequency 2x / week   OT Duration 8 weeks   OT Treatment/Interventions Self-care/ADL training;Therapeutic exercise;Functional Mobility Training;Patient/family education;Neuromuscular education;Manual Therapy;Therapeutic exercises;Splinting;Energy conservation;Cryotherapy;DME and/or AE instruction;Therapeutic activities;Cognitive remediation/compensation;Moist Heat;Electrical Stimulation;Fluidtherapy;Passive range of motion   Plan simple cooking/ home management   OT Home Exercise Plan issued coordination/ red putty 01/10/16   Consulted and Agree with Plan of Care Patient;Family member/caregiver        Problem List Patient Active Problem List   Diagnosis Date Noted  . ICH (intracerebral hemorrhage) ([place]) - R occipital  12/27/2015  . Atrial fibrillation with RVR ([place]) 12/27/2015  . Hyperlipidemia 12/27/2015  . Cognitive deficits 12/27/2015  . Hypothyroidism 12/27/2015  . Anxiety 12/27/2015  . Subarachnoid hemorrhage ([place]) - L frontal 12/25/2015  . Cerebellar stroke ([place]) 12/02/2014  . Osteopenia 12/01/2014  . Rhinitis, chronic 01/13/2014  . Breast cancer of upper-outer quadrant of right female breast ([place]) 09/26/2013    RINE,KATHRYN 01/10/2016, 8:01 PM Theone Murdoch, OTR/L [phone] Phone: [phone] 8:01 PM 02/16/2017[facility] [address] [place], [place], [address] Phone: [phone]   Fax:  [phone]  Name: Barbara [name] MRN: [id number] Date of Birth: Dec 26, 1950

## 2016-01-10 NOTE — Therapy (Signed)
[facility] [address] [place], [place], [address] Phone: [phone]   Fax:  [phone]  Physical Therapy Treatment  Patient Details  Name: Barbara [name] MRN: [id number] Date of Birth: 1951-09-24 Referring Provider: Rosalin Hawking  Encounter Date: 01/10/2016      PT End of Session - 01/10/16 1055    Visit Number 2   Number of Visits 17   Date for PT Re-Evaluation 03/02/16   Authorization Type [employer]/Medicare   Authorization Time Period G codes every 10th visit   PT Start Time 0935   PT Stop Time 1017   PT Time Calculation (min) 42 min   Equipment Utilized During Treatment Gait belt   Activity Tolerance Patient tolerated treatment well   Behavior During Therapy [facility] for tasks assessed/performed  slightly impulsive at times      Past Medical History  Diagnosis Date  . Breast cancer ([place])   . Hashimoto's thyroiditis     with goiter  . Hypercholesteremia   . 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  Narrowing of the 4th and 5th lumbar interspaces.   Marland Kitchen Spina bifida ([place])     occutta L5  . Anxiety   . GERD (gastroesophageal reflux disease)   . Headache(784.0)     hx migraines  . Varicose vein     Past Surgical History  Procedure Laterality Date  . Hernia repair  1976    Left groin  . Tubal ligation    . Cystoscopy with urethral caruncle    . Mastectomy w/ sentinel node biopsy Bilateral 10/13/2013    Procedure:  BILATERAL MASTECTOMY WITH RIGHT SENTINEL LYMPH NODE BIOPSY;  Surgeon: Merrie Roof, MD;  Location: [facility];  Service: General;  Laterality: Bilateral;  . Portacath placement Left 10/13/2013    Procedure: INSERTION PORT-A-CATH;  Surgeon: Merrie Roof, MD;  Location: [facility];  Service: General;  Laterality: Left;  . Breast reconstruction with placement of tissue expander and flex hd (acellular hydrated dermis) Bilateral 10/13/2013    Procedure: PLACEMENT OF BILATERAL TISSUE EXPANDER FOR BREAST RECONSTRUCTION ;  Surgeon: Crissie Reese, MD;  Location: [facility];  Service: Plastics;  Laterality: Bilateral;  . Wisdom tooth extraction  1990  . Port-a-cath removal Left 12/21/2014    Procedure: MINOR REMOVAL OF PORT-A-CATH;  Surgeon: Autumn Messing III, MD;  Location: [facility];  Service: General;  Laterality: Left;    There were no vitals filed for this visit.  Visit Diagnosis:  Unsteadiness  Abnormality of gait  Decreased coordination      Subjective Assessment - 01/10/16 0937    Subjective Pt arrives today with husband stating her balance is improving. Says she was able to sit on stool to garden and has been going up and down her stairs. Says she has been cleared to bend forward at this time.   Patient is accompained by: Family member  Husband   Patient Stated Goals I want to get my gait back, and carry things, bend over, and get down so I can work in my garden again.   Currently in Pain? No/denies            [facility] Adult PT Treatment/Exercise - 01/10/16 1044    Ambulation/Gait   Ambulation/Gait Yes   Ambulation/Gait Assistance 7: Independent   Ambulation Distance (Feet) 75 Feet   Assistive device None   Gait Pattern Step-through pattern;Decreased arm  swing - right;Decreased arm swing - left   Ambulation Surface Level;Indoor   Stairs Yes   Stairs Assistance 6: Modified independent (Device/Increase time);4: Min guard   Stair Management Technique No rails;One rail Right;One rail Left;Two rails;Alternating pattern   Number of Stairs 4  x4   Height of Stairs 6   Gait Comments Pt able to ambulate safely and independently today. Still demonstrates difficulty/imbalance with turning, no overt LOB. Pt able to negotiate stairs safely and independently with two rails and one rail on L. Pt able to ascend stairs with reciprocal pattern and no rail without difficulty, pt demonstrates LOB and need for UE support on  rail with stair descent attempted with no rail.   Neuro Re-ed    Neuro Re-ed Details  The following performed in corner with chair placed anteriorly and supervision from PT for pt safety: pt performed static standing feet together eyes open on floor 3x30 sec, horizontal/vertical/diagonal head turns 3x30 sec each, eyes closed 3x30 sec. Pt demonstrates increased postural sway with feet together eyes closed. Progressed to feet together eyes open on 1" foam pad 3x30 sec, feet together eyes open horizontal/vertical/diagonal head turns 3x30 sec each. Pt demonstrates increased postural sway with these activities, but able to self correct. Performed the following in tall kneeling to facilitate trunk  control and activation: reach in various directions and heights for cones and placing R<>L, L<>R x12 each way. Pt demonstrates increased trunk flexion and moving COG posteriorly over heels when performing despite verbal and tactile cues to weight shift through LEs. Progressed to weight shift in med/lat direction with 1/2" lift of knee off of mat table and min guard from PT. Able to facilitate increased weight shift with this exercise.             PT Education - 01/10/16 1054    Education provided Yes   Education Details HEP implemented - please see pt instructions. Pt educated to utilize rails when negotiating stairs at home as she exhibits difficulty and increased risk for falls when descending with no UE support.   Person(s) Educated Patient;Spouse   Methods Explanation   Comprehension Verbalized understanding          PT Short Term Goals - 01/02/16 1545    PT SHORT TERM GOAL #1   Title The patient will demonstrate initiation of HEP with supervision and assistance from husband as needed to maximize functional gains made in PT.   Baseline TARGET DATE = 01/30/16   Time 4   Period Weeks   Status New   PT SHORT TERM GOAL #2   Title The patient will demonstrate ability to negotiate 12 steps with 1  handrail assistance mod I and reciprocal gait pattern in order to safely reach second floor of home for bathing.   Time 4   Period Weeks   Status New   PT SHORT TERM GOAL #3   Title The patient will ambulate 150' indoors over level surfaces at supervision level while performing horizontal head turns x2 each direction and vertical head turns x2 each direction to demonstrate improvements and safety with dynamic gait.   Time 4   Period Weeks   Status New   PT SHORT TERM GOAL #4   Title The patient will demonstrate ability to move from quadruped position to tall kneeling position independently 5x with no overt LOB to simulate gardening tasks at home.   Time 4   Period Weeks   Status New   PT SHORT TERM  GOAL #5   Title The patient will improve 10 m walk test to at least 2.62 ft/sec in order to demonstrate improvent in gait speed and become safe community ambulator.   Baseline 2.54 ft/sec   Time 8   Period Weeks   Status New           PT Long Term Goals - 01/02/16 1536    PT LONG TERM GOAL #1   Title The patient will demonstrate independence and safety with HEP in order to maximize functional gains made in PT.   Baseline TARGET DATE = 03/02/16   Time 8   Period Weeks   Status New   PT LONG TERM GOAL #2   Title The patient will return to gardening at home including kneeling, bending over, and sitting on stool in order to return to leisure activities safely and independently.   Time 8   Period Weeks   Status New   PT LONG TERM GOAL #3   Title The patient will improve DGI score to at least 19 in order to demonstrate improvements and safety with dynamic gait.   Baseline DGI = 14   Time 8   Period Weeks   Status New   PT LONG TERM GOAL #4   Title The patient will ambulate 150' outdoors over paved surfaces and grass independently in order to demonstrate independence with community ambulation and safety going outdoors to her garden.   Baseline The patient is currently unable to walk  outdoors to her garden without assistance   Time 8   Period Weeks   Status New   PT LONG TERM GOAL #5   Title The patient will improve Neuro QOL LE by at least 5 points to demonstrate improved QOL and LE function.   Baseline initial = 48.9   Time 8   Period Weeks            Plan - 01/10/16 1057    Clinical Impression Statement The patient demonstrates steady progress towards goals during today's session. HEP implemented for balance training at home. Able to maintain tall kneeling safely, but demonstrates decreased ability to weight shift L/R and exhibits tendency to shift COG posteriorly and inferiorly for increased balance, decreased coordination of L LE. Continue per POC.   Pt will benefit from skilled therapeutic intervention in order to improve on the following deficits Abnormal gait;Decreased balance;Decreased mobility;Decreased strength;Difficulty walking;Impaired vision/preception   Rehab Potential Good   Clinical Impairments Affecting Rehab Potential good support system - husband   PT Frequency 2x / week   PT Duration 8 weeks   PT Treatment/Interventions ADLs/Self Care Home Management;Biofeedback;Canalith Repostioning;Electrical Stimulation;Moist Heat;Cryotherapy;DME Instruction;Gait training;Stair training;Functional mobility training;Therapeutic activities;Therapeutic exercise;Balance training;Neuromuscular re-education;Patient/family education;Orthotic Fit/Training;Manual techniques;Passive range of motion;Vestibular;Visual/perceptual remediation/compensation   PT Next Visit Plan VOR x1?, review/progress HEP, standing ball toss with vertical/horizontal/diagonal head turns/tracking on stable surface/compliant/rockerboard, gait with head turns   PT Home Exercise Plan please see pt instructions   Consulted and Agree with Plan of Care Patient;Family member/caregiver   Family Member Consulted Husband        Problem List Patient Active Problem List   Diagnosis Date Noted  .  ICH (intracerebral hemorrhage) ([place]) - R occipital 12/27/2015  . Atrial fibrillation with RVR ([place]) 12/27/2015  . Hyperlipidemia 12/27/2015  . Cognitive deficits 12/27/2015  . Hypothyroidism 12/27/2015  . Anxiety 12/27/2015  . Subarachnoid hemorrhage ([place]) - L frontal 12/25/2015  . Cerebellar stroke ([place]) 12/02/2014  . Osteopenia 12/01/2014  . Rhinitis, chronic 01/13/2014  .  Breast cancer of upper-outer quadrant of right female breast ([place]) 09/26/2013    De Nurse, SPT 01/10/2016, 12:56 PM  [facility] [address] [place], [place], [address] Phone: [phone]   Fax:  [phone]  Name: Barbara [name] MRN: [id number] Date of Birth: 02/09/1951

## 2016-01-11 ENCOUNTER — Ambulatory Visit: Payer: PPO | Admitting: Rehabilitative and Restorative Service Providers"

## 2016-01-11 ENCOUNTER — Encounter: Payer: PPO | Admitting: Occupational Therapy

## 2016-01-11 ENCOUNTER — Ambulatory Visit: Payer: PPO | Admitting: Occupational Therapy

## 2016-01-11 ENCOUNTER — Ambulatory Visit: Payer: PPO

## 2016-01-11 DIAGNOSIS — R278 Other lack of coordination: Secondary | ICD-10-CM

## 2016-01-11 DIAGNOSIS — R2681 Unsteadiness on feet: Secondary | ICD-10-CM

## 2016-01-11 DIAGNOSIS — R279 Unspecified lack of coordination: Secondary | ICD-10-CM

## 2016-01-11 DIAGNOSIS — R269 Unspecified abnormalities of gait and mobility: Secondary | ICD-10-CM

## 2016-01-11 DIAGNOSIS — R4701 Aphasia: Secondary | ICD-10-CM

## 2016-01-11 DIAGNOSIS — R4189 Other symptoms and signs involving cognitive functions and awareness: Secondary | ICD-10-CM

## 2016-01-11 DIAGNOSIS — R29898 Other symptoms and signs involving the musculoskeletal system: Secondary | ICD-10-CM

## 2016-01-11 NOTE — Therapy (Signed)
[facility] [address] [place], [place], [address] Phone: [phone]   Fax:  [phone]  Occupational Therapy Treatment  Patient Details  Name: Barbara [name] MRN: [id number] Date of Birth: 05-15-1951 Referring Provider: Dr. Rosalin Hawking  Encounter Date: 01/11/2016      OT End of Session - 01/10/16 1954    Visit Number 2   Number of Visits 17   Date for OT Re-Evaluation 03/02/16   Authorization Type [employer], G-code needed   Authorization - Visit Number 2   Authorization - Number of Visits 10   OT Start Time 1407   OT Stop Time 1448   OT Time Calculation (min) 41 min   Activity Tolerance Patient tolerated treatment well   Behavior During Therapy [facility] for tasks assessed/performed      Past Medical History  Diagnosis Date  . Breast cancer ([place])   . Hashimoto's thyroiditis     with goiter  . Hypercholesteremia   . 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  Narrowing of the 4th and 5th lumbar interspaces.   Marland Kitchen Spina bifida ([place])     [place] L5  . Anxiety   . GERD (gastroesophageal reflux disease)   . Headache(784.0)     hx migraines  . Varicose vein     Past Surgical History  Procedure Laterality Date  . Hernia repair  1976    Left groin  . Tubal ligation    . Cystoscopy with urethral caruncle    . Mastectomy w/ sentinel node biopsy Bilateral 10/13/2013    Procedure:  BILATERAL MASTECTOMY WITH RIGHT SENTINEL LYMPH NODE BIOPSY;  Surgeon: Merrie Roof, MD;  Location: [facility];  Service: General;  Laterality: Bilateral;  . Portacath placement Left 10/13/2013    Procedure: INSERTION PORT-A-CATH;  Surgeon: Merrie Roof, MD;  Location: [facility];  Service: General;  Laterality: Left;  . Breast reconstruction with placement of tissue expander and flex hd (acellular hydrated dermis) Bilateral 10/13/2013    Procedure: PLACEMENT OF BILATERAL TISSUE  EXPANDER FOR BREAST RECONSTRUCTION ;  Surgeon: Crissie Reese, MD;  Location: [facility];  Service: Plastics;  Laterality: Bilateral;  . Wisdom tooth extraction  1990  . Port-a-cath removal Left 12/21/2014    Procedure: MINOR REMOVAL OF PORT-A-CATH;  Surgeon: Autumn Messing III, MD;  Location: [facility];  Service: General;  Laterality: Left;    There were no vitals filed for this visit.  Visit Diagnosis:  Cognitive deficits  Weakness of left hand  Decreased coordination      Subjective Assessment - 01/11/16 1433    Subjective  Pt reports she plans to work in her yard this weekend   Pertinent History hx of breast CA (2014) with chemo for 1 year and bilateral mastectomy; hx of word finding and concentration difficulties which improved after completing chemo; a-fib, hx of small punctate infarct L erebellum vs. demyelinating disease   Patient Stated Goals improve balance, be able to garden   Currently in Pain? No/denies          Treatment:Basic cooking task to make macaroni and cheese from a box mix. Pt located items and performed with good safety awareness. Pt remembered to turn off the stove without prompting. Pt plans to perform yard work this weekend. Therapist and pt discussed safety for this task and not getting over fatigued. Pt plans to rest after approximately 30 mins.  Placing grooved pegs in pegboard with LUE, no significant difficulty,  removing and manipulating in hand with only mild difficulty.                         OT Short Term Goals - 01/11/16 1435    OT SHORT TERM GOAL #1   Title Pt will be independent with HEP for L hand coordination and strength.--check STGs 01/31/16   Status On-going   OT SHORT TERM GOAL #2   Title Pt will perform simple home maintenance tasks with no LOB.   Status On-going   OT SHORT TERM GOAL #3   Title Pt will perform simple cooking task with written directions mod I.   Status Achieved   OT SHORT TERM GOAL #4   Title  Pt will perform simple financial management tasks without cueing with at least 95% accuracy.   Status On-going           OT Long Term Goals - 01/03/16 2202    OT LONG TERM GOAL #1   Title Pt will perform mod complex home maintenance tasks mod I.--check LTGs 03/02/16   Time 8   Period Weeks   Status New   OT LONG TERM GOAL #2   Title Pt will perform simple-mod complex financial management tasks with at least 95% accuracy.   Time 8   Period Weeks   Status New   OT LONG TERM GOAL #3   Title Pt will be able to cook 2 items simultaneously safely and demonstrating good planning/divided attention.   Time 8   Period Weeks   Status New   OT LONG TERM GOAL #4   Title Pt will improve L grip strength by at least 5lbs to assist in opening containers/lifting objects.   Baseline R-60lbs, L-47lbs   Time 8   Period Weeks   Status New   OT LONG TERM GOAL #5   Title Pt will improve coordination for ADLs as shown by improving time on 9-hole peg test by at least 5sec with L hand.   Baseline R-17.84sec, L-25.42sec   Time 8   Period Weeks   Status New               Plan - 01/11/16 1334    Clinical Impression Statement Pt is progressing towards goals. Pt demonstrates improvements in coordination and she demonstrates ability to perform a simple cooking task safely.   Pt will benefit from skilled therapeutic intervention in order to improve on the following deficits (Retired) Decreased cognition;Decreased strength;Decreased mobility;Decreased balance;Decreased knowledge of use of DME;Impaired UE functional use;Decreased coordination   Rehab Potential Good   Clinical Impairments Affecting Rehab Potential ?awareness   OT Frequency 2x / week   OT Duration 8 weeks   OT Treatment/Interventions Self-care/ADL training;Therapeutic exercise;Functional Mobility Training;Patient/family education;Neuromuscular education;Manual Therapy;Therapeutic exercises;Splinting;Energy conservation;Cryotherapy;DME  and/or AE instruction;Therapeutic activities;Cognitive remediation/compensation;Moist Heat;Electrical Stimulation;Fluidtherapy;Passive range of motion   Plan check book management task with calculator   Consulted and Agree with Plan of Care Patient;Family member/caregiver        Problem List Patient Active Problem List   Diagnosis Date Noted  . ICH (intracerebral hemorrhage) ([place]) - R occipital 12/27/2015  . Atrial fibrillation with RVR ([place]) 12/27/2015  . Hyperlipidemia 12/27/2015  . Cognitive deficits 12/27/2015  . Hypothyroidism 12/27/2015  . Anxiety 12/27/2015  . Subarachnoid hemorrhage ([place]) - L frontal 12/25/2015  . Cerebellar stroke ([place]) 12/02/2014  . Osteopenia 12/01/2014  . Rhinitis, chronic 01/13/2014  .  Breast cancer of upper-outer quadrant of right female breast ([place]) 09/26/2013    RINE,KATHRYN 01/11/2016, 2:36 PM Theone Murdoch, OTR/L [phone] Phone: [phone] 2:37 PM 01/11/2016 [facility] [address] [place], [place], [address] Phone: [phone]   Fax:  [phone]  Name: JACQUI IRIAS MRN: [id number] Date of Birth: February 16, 1951

## 2016-01-11 NOTE — Therapy (Signed)
[facility] [address] [place], [place], [address] Phone: [phone]   Fax:  [phone]  Speech Language Pathology Treatment  Patient Details  Name: Barbara [name] MRN: [id number] Date of Birth: Jun 15, 1951 No Data Recorded  Encounter Date: 01/11/2016      End of Session - 01/11/16 1241    Visit Number 2   Number of Visits 17   Date for SLP Re-Evaluation 03/03/16   SLP Start Time 1149   SLP Stop Time  1230   SLP Time Calculation (min) 41 min   Activity Tolerance Patient tolerated treatment well      Past Medical History  Diagnosis Date  . Breast cancer ([place])   . Hashimoto's thyroiditis     with goiter  . Hypercholesteremia   . 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  Narrowing of the 4th and 5th lumbar interspaces.   Marland Kitchen Spina bifida ([place])     occut[place] L5  . Anxiety   . GERD (gastroesophageal reflux disease)   . Headache(784.0)     hx migraines  . Varicose vein     Past Surgical History  Procedure Laterality Date  . Hernia repair  1976    Left groin  . Tubal ligation    . Cystoscopy with urethral caruncle    . Mastectomy w/ sentinel node biopsy Bilateral 10/13/2013    Procedure:  BILATERAL MASTECTOMY WITH RIGHT SENTINEL LYMPH NODE BIOPSY;  Surgeon: Merrie Roof, MD;  Location: [facility];  Service: General;  Laterality: Bilateral;  . Portacath placement Left 10/13/2013    Procedure: INSERTION PORT-A-CATH;  Surgeon: Merrie Roof, MD;  Location: [facility];  Service: General;  Laterality: Left;  . Breast reconstruction with placement of tissue expander and flex hd (acellular hydrated dermis) Bilateral 10/13/2013    Procedure: PLACEMENT OF BILATERAL TISSUE EXPANDER FOR BREAST RECONSTRUCTION ;  Surgeon: Crissie Reese, MD;  Location: [facility];  Service: Plastics;  Laterality: Bilateral;  . Wisdom tooth extraction  1990  . Port-a-cath removal Left 12/21/2014   Procedure: MINOR REMOVAL OF PORT-A-CATH;  Surgeon: Autumn Messing III, MD;  Location: [facility];  Service: General;  Laterality: Left;    There were no vitals filed for this visit.  Visit Diagnosis: Aphasia      Subjective Assessment - 01/11/16 1151    Subjective Pt arrives with her husband.   Currently in Pain? No/denies               ADULT SLP TREATMENT - 01/11/16 1153    General Information   Behavior/Cognition Alert;Cooperative;Pleasant mood   Cognitive-Linquistic Treatment   Treatment focused on Aphasia   Skilled Treatment Pt explained her ancestory project and gardening project for first 17 minutes of session. Rare (five) errors during that time and pt unaware. Pt's slower rate very evident during this time. In structured tasks mod complex naming/semantic tasks, pt req'd mod-max A from SLP occasionally.   Assessment / Recommendations / Plan   Plan Continue with current plan of care          SLP Education - 01/11/16 1213    Education provided Yes   Education Details speech compensations (synonym, circumlocution, description)   Person(s) Educated Patient;Spouse   Methods Explanation;Demonstration   Comprehension Verbalized understanding          SLP Short Term Goals - 01/11/16 1207    SLP SHORT TERM GOAL #  1   Title pt will tell SLP 3 compensations for anomia/dysnomia   Time 4   Period Weeks   Status On-going   SLP SHORT TERM GOAL #2   Title pt will demo emergent awareness of aphasic errors 90% of the time   Time 4   Period Weeks   Status On-going   SLP SHORT TERM GOAL #3   Title pt will demo understanding of 10 minute simple to mod-complex conversation with pt requests for repeats allowed   Time 4   Period Weeks   Status On-going          SLP Long Term Goals - 01/11/16 1207    SLP LONG TERM GOAL #1   Title pt will demo understanding of 10 minutes mod complex/complex conversation with pt requests for repeats allowed   Time 8    Period Weeks   Status On-going   SLP LONG TERM GOAL #2   Title pt will demo compensations to generate 10 minutes WFL mod complex/complex conversation   Time 8   Period Weeks   Status On-going          Plan - 01/11/16 1241    Clinical Impression Statement Pt presents with min-mod expressive aphasia depending on conversational topic, and likely mild receptive aphasia, having occurred following a CVA last year. Pt would benefit fromskilled ST to maximize receptive and expressive aphasia. Likely some cognitive deficits as well however SLP to focus on aphasia. Pt/family agree with this plan.   Speech Therapy Frequency 2x / week   Duration --  8 weeks   Treatment/Interventions SLP instruction and feedback;Compensatory strategies;Internal/external aids;Patient/family education;Functional tasks;Language facilitation;Cueing hierarchy   Potential to Achieve Goals Good   Potential Considerations Other (comment)  time post onset   Consulted and Agree with Plan of Care Patient;Family member/caregiver   Family Member Consulted husband        Problem List Patient Active Problem List   Diagnosis Date Noted  . ICH (intracerebral hemorrhage) ([place]) - R occipital 12/27/2015  . Atrial fibrillation with RVR ([place]) 12/27/2015  . Hyperlipidemia 12/27/2015  . Cognitive deficits 12/27/2015  . Hypothyroidism 12/27/2015  . Anxiety 12/27/2015  . Subarachnoid hemorrhage ([place]) - L frontal 12/25/2015  . Cerebellar stroke ([place]) 12/02/2014  . Osteopenia 12/01/2014  . Rhinitis, chronic 01/13/2014  . Breast cancer of upper-outer quadrant of right female breast ([place]) 09/26/2013    [facility] ,[place], [place]  01/11/2016, 12:42 PM  [facility] [address] [place], [place], [address] Phone: [phone]   Fax:  [phone]   Name: Barbara [name] MRN: [id number] Date of Birth: 25-Aug-1951

## 2016-01-11 NOTE — Patient Instructions (Signed)
  Please complete the assigned speech therapy homework and return it to your next session.  

## 2016-01-11 NOTE — Therapy (Signed)
[facility] [address] [place], [place], [address] Phone: [phone]   Fax:  [phone]  Physical Therapy Treatment  Patient Details  Name: Barbara [name] MRN: [id number] Date of Birth: [birth year] Referring Provider: Rosalin Hawking  Encounter Date: 01/11/2016      PT End of Session - 01/11/16 1339    Visit Number 3   Number of Visits 17   Date for PT Re-Evaluation 03/02/16   Authorization Type [employer]/Medicare   Authorization Time Period G codes every 10th visit   PT Start Time 1230   PT Stop Time 1315   PT Time Calculation (min) 45 min   Equipment Utilized During Treatment Gait belt   Activity Tolerance Patient tolerated treatment well   Behavior During Therapy [facility] for tasks assessed/performed      Past Medical History  Diagnosis Date  . Breast cancer ([place])   . Hashimoto's thyroiditis     with goiter  . Hypercholesteremia   . 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  Narrowing of the 4th and 5th lumbar interspaces.   Marland Kitchen Spina bifida ([place])     occutta L5  . Anxiety   . GERD (gastroesophageal reflux disease)   . Headache(784.0)     hx migraines  . Varicose vein     Past Surgical History  Procedure Laterality Date  . Hernia repair  1976    Left groin  . Tubal ligation    . Cystoscopy with urethral caruncle    . Mastectomy w/ sentinel node biopsy Bilateral 10/13/2013    Procedure:  BILATERAL MASTECTOMY WITH RIGHT SENTINEL LYMPH NODE BIOPSY;  Surgeon: Merrie Roof, MD;  Location: [facility] [place];  Service: General;  Laterality: Bilateral;  . Portacath placement Left 10/13/2013    Procedure: INSERTION PORT-A-CATH;  Surgeon: Merrie Roof, MD;  Location: [place];  Service: General;  Laterality: Left;  . Breast reconstruction with placement of tissue expander and flex hd (acellular hydrated dermis) Bilateral 10/13/2013    Procedure: PLACEMENT OF  BILATERAL TISSUE EXPANDER FOR BREAST RECONSTRUCTION ;  Surgeon: Crissie Reese, MD;  Location: [place];  Service: Plastics;  Laterality: Bilateral;  . Wisdom tooth extraction  1990  . Port-a-cath removal Left 12/21/2014    Procedure: MINOR REMOVAL OF PORT-A-CATH;  Surgeon: Autumn Messing III, MD;  Location: [facility];  Service: General;  Laterality: Left;    There were no vitals filed for this visit.  Visit Diagnosis:  Abnormality of gait  Unsteadiness  Decreased coordination      Subjective Assessment - 01/11/16 1628    Subjective Pt arrives today with husband stating she was consistent with HEP. Able to shovel in garden yesterday and use wheel barrow with husband's supervision without LOB.   Patient is accompained by: Family member   Patient Stated Goals I want to get my gait back, and carry things, bend over, and get down so I can work in my garden again.   Currently in Pain? No/denies            [facility] PT Assessment - 01/11/16 1323    Observation/Other Assessments   Observations Pt with 2 inserts into L shoe to self-correct leg length discrepancy.          [facility] Adult PT Treatment/Exercise - 01/11/16 1323    Ambulation/Gait   Ambulation/Gait Yes   Ambulation/Gait Assistance 4: Min guard   Ambulation  Distance (Feet) 345 Feet   Assistive device None   Gait Pattern Step-through pattern;Decreased arm swing - right;Decreased arm swing - left   Ambulation Surface Level;Indoor   Gait Comments Ambulated 115' performing horizontal head turns L/R with min guard. Ambulated 115' with vertical head turns and min guard. Ambulated 115' with verbal command to turn head R/L/up/down to work on reactive postural control with unanticipated head movements. pt required min guard and demonstrated 2 LOB requiring PT to regain balance. Pt ambulated in figure 8 pattern around hula hoops x4 trips with minimal difficulty.   Self-Care   Self-Care --   Therapeutic Activites    Therapeutic  Activities Other Therapeutic Activities   Other Therapeutic Activities Pt performed quadruped to tall kneeling position x7 to simulate gardening activities at home.   Neuro Re-ed    Neuro Re-ed Details  The following performed in corner with chair in front and 2 pillows underneath feet: static balance feet together eyes closed 3x30 sec to challenge vestibular system and reduce visual fixation. The following performed in parallel bars for pt safety: static standing on rockerboard med/lat direction 3x30 sec each with no UE assist and min guard from PT. Taps L/R on rockerboard 2x10 each direction with no UE assist. Pt performed static standing on rockerboard in med/lat direction with hoizontal/vertical head turns x10 each. Pt demonstrated increased LOB requiring UEs to catch herself when performing head turns. VORx1 horizontal/vertical 3x20 sec with verbal cues to maintain focus of letter, decrease speed of movement, and decrease amplitude of movement. The following performed in agility ladder with min guard from PT for pt safety: step ant both feet in to first rung, followed by turn to R in second rung, step to box ant both feet, step and turn to L both feet down and back x2. Verbal cues to look up while performing. Pt performed same sequence with PT holding random number with fingers and asking pt to identify when performing turns x2 trips down and back to facilitate upward gaze while turning.            PT Education - 01/11/16 1339    Education provided Yes   Education Details VORx1 vertical/horizontal added to pt HEP.    Person(s) Educated Patient;Spouse   Methods Explanation   Comprehension Verbalized understanding          PT Short Term Goals - 01/11/16 1340    PT SHORT TERM GOAL #1   Title The patient will demonstrate initiation of HEP with supervision and assistance from husband as needed to maximize functional gains made in PT.   Baseline TARGET DATE = 01/30/16   Time 4   Period Weeks    Status On-going   PT SHORT TERM GOAL #2   Title The patient will demonstrate ability to negotiate 12 steps with 1 handrail assistance mod I and reciprocal gait pattern in order to safely reach second floor of home for bathing.   Time 4   Period Weeks   Status On-going   PT SHORT TERM GOAL #3   Title The patient will ambulate 150' indoors over level surfaces at supervision level while performing horizontal head turns x2 each direction and vertical head turns x2 each direction to demonstrate improvements and safety with dynamic gait.   Time 4   Period Weeks   Status On-going   PT SHORT TERM GOAL #4   Title The patient will demonstrate ability to move from quadruped position to tall kneeling position independently 5x with  no overt LOB to simulate gardening tasks at home.   Baseline MET = 01/11/16   Time 4   Period Weeks   Status Achieved   PT SHORT TERM GOAL #5   Title The patient will improve 10 m walk test to at least 2.62 ft/sec in order to demonstrate improvent in gait speed and become safe community ambulator.   Baseline 2.54 ft/sec   Time 8   Period Weeks   Status On-going           PT Long Term Goals - 01/02/16 1536    PT LONG TERM GOAL #1   Title The patient will demonstrate independence and safety with HEP in order to maximize functional gains made in PT.   Baseline TARGET DATE = 03/02/16   Time 8   Period Weeks   Status New   PT LONG TERM GOAL #2   Title The patient will return to gardening at home including kneeling, bending over, and sitting on stool in order to return to leisure activities safely and independently.   Time 8   Period Weeks   Status New   PT LONG TERM GOAL #3   Title The patient will improve DGI score to at least 19 in order to demonstrate improvements and safety with dynamic gait.   Baseline DGI = 14   Time 8   Period Weeks   Status New   PT LONG TERM GOAL #4   Title The patient will ambulate 150' outdoors over paved surfaces and grass  independently in order to demonstrate independence with community ambulation and safety going outdoors to her garden.   Baseline The patient is currently unable to walk outdoors to her garden without assistance   Time 8   Period Weeks   Status New   PT LONG TERM GOAL #5   Title The patient will improve Neuro QOL LE by at least 5 points to demonstrate improved QOL and LE function.   Baseline initial = 48.9   Time 8   Period Weeks            Plan - 01/11/16 1341    Clinical Impression Statement Pt with continued improvements in functional mobility, gait, and balance during today's session. Pt describes performing shoveling activity in garden with supervision from her husband and no overt LOB, accomplished STG 1/5 performing quadruped to tall kneeling x5 independently to simulate garden activities. Pt continues to have difficulty with head turns and relies heavily on visual fixation for balance and postural control. VORx1 added to HEP to address this. Continue per POC.   Pt will benefit from skilled therapeutic intervention in order to improve on the following deficits Abnormal gait;Decreased balance;Decreased mobility;Decreased strength;Difficulty walking;Impaired vision/preception   Rehab Potential Good   Clinical Impairments Affecting Rehab Potential good support system - husband   PT Frequency 2x / week   PT Duration 8 weeks   PT Treatment/Interventions ADLs/Self Care Home Management;Biofeedback;Canalith Repostioning;Electrical Stimulation;Moist Heat;Cryotherapy;DME Instruction;Gait training;Stair training;Functional mobility training;Therapeutic activities;Therapeutic exercise;Balance training;Neuromuscular re-education;Patient/family education;Orthotic Fit/Training;Manual techniques;Passive range of motion;Vestibular;Visual/perceptual remediation/compensation   PT Next Visit Plan Review HEP; high level balance activities - walking with ball toss, balloon, walking with ball and head  movements; gait training with head movements, consider outdoors   PT Home Exercise Plan Added VORx1 horizontal/vertical   Consulted and Agree with Plan of Care Patient;Family member/caregiver   Family Member Consulted Husband - Ed        Problem List Patient Active Problem List   Diagnosis  Date Noted  . ICH (intracerebral hemorrhage) ([place]) - R occipital 12/27/2015  . Atrial fibrillation with RVR ([place]) 12/27/2015  . Hyperlipidemia 12/27/2015  . Cognitive deficits 12/27/2015  . Hypothyroidism 12/27/2015  . Anxiety 12/27/2015  . Subarachnoid hemorrhage ([place]) - L frontal 12/25/2015  . Cerebellar stroke ([place]) 12/02/2014  . Osteopenia 12/01/2014  . Rhinitis, chronic 01/13/2014  . Breast cancer of upper-outer quadrant of right female breast ([place]) 09/26/2013    De Nurse, SPT 01/11/2016, 4:29 PM  [facility] [address] [place], [place], [address] Phone: [phone]   Fax:  [phone]  Name: Barbara [name] MRN: [id number] Date of Birth: 05-Jul-1951

## 2016-01-16 ENCOUNTER — Ambulatory Visit: Payer: PPO | Admitting: Occupational Therapy

## 2016-01-16 ENCOUNTER — Ambulatory Visit: Payer: PPO

## 2016-01-16 DIAGNOSIS — R279 Unspecified lack of coordination: Secondary | ICD-10-CM

## 2016-01-16 DIAGNOSIS — R4189 Other symptoms and signs involving cognitive functions and awareness: Secondary | ICD-10-CM

## 2016-01-16 DIAGNOSIS — R278 Other lack of coordination: Secondary | ICD-10-CM

## 2016-01-16 DIAGNOSIS — R29898 Other symptoms and signs involving the musculoskeletal system: Secondary | ICD-10-CM

## 2016-01-16 DIAGNOSIS — R4701 Aphasia: Secondary | ICD-10-CM

## 2016-01-16 DIAGNOSIS — R269 Unspecified abnormalities of gait and mobility: Secondary | ICD-10-CM | POA: Diagnosis not present

## 2016-01-16 NOTE — Therapy (Signed)
[facility] [address] [place], [place], [address] Phone: [phone]   Fax:  [phone]  Speech Language Pathology Treatment  Patient Details  Name: Barbara [name] MRN: [id number] Date of Birth: [birth year] No Data Recorded  Encounter Date: 01/16/2016      End of Session - 01/16/16 1014    Visit Number 3   SLP Start Time 0932   SLP Stop Time  [id number]   SLP Time Calculation (min) 43 min   Activity Tolerance Patient tolerated treatment well      Past Medical History  Diagnosis Date  . Breast cancer ([place])   . Hashimoto's thyroiditis     with goiter  . Hypercholesteremia   . 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  Narrowing of the 4th and 5th lumbar interspaces.   Marland Kitchen Spina bifida ([place])     occutta L5  . Anxiety   . GERD (gastroesophageal reflux disease)   . Headache(784.0)     hx migraines  . Varicose vein     Past Surgical History  Procedure Laterality Date  . Hernia repair  1976    Left groin  . Tubal ligation    . Cystoscopy with urethral caruncle    . Mastectomy w/ sentinel node biopsy Bilateral 10/13/2013    Procedure:  BILATERAL MASTECTOMY WITH RIGHT SENTINEL LYMPH NODE BIOPSY;  Surgeon: Merrie Roof, MD;  Location: [facility] [place];  Service: General;  Laterality: Bilateral;  . Portacath placement Left 10/13/2013    Procedure: INSERTION PORT-A-CATH;  Surgeon: Merrie Roof, MD;  Location: [place];  Service: General;  Laterality: Left;  . Breast reconstruction with placement of tissue expander and flex hd (acellular hydrated dermis) Bilateral 10/13/2013    Procedure: PLACEMENT OF BILATERAL TISSUE EXPANDER FOR BREAST RECONSTRUCTION ;  Surgeon: Crissie Reese, MD;  Location: [facility];  Service: Plastics;  Laterality: Bilateral;  . Wisdom tooth extraction  1990  . Port-a-cath removal Left 12/21/2014    Procedure: MINOR REMOVAL OF PORT-A-CATH;  Surgeon: Autumn Messing  III, MD;  Location: [place];  Service: General;  Laterality: Left;    There were no vitals filed for this visit.  Visit Diagnosis: Aphasia      Subjective Assessment - 01/16/16 0933    Subjective Pt tells SLP she completed all of the homework.   Patient is accompained by: Family member  husband               ADULT SLP TREATMENT - 01/16/16 0939    General Information   Behavior/Cognition Alert;Cooperative;Pleasant mood   Cognitive-Linquistic Treatment   Treatment focused on Aphasia   Skilled Treatment Pt did not request repeat in 8 minutes of simple to mod complex conversation,indicating understanding. Pt told SLP compensations for anomia/dysnomia with mod A, occasionally. SLP had pt practice description strategy by describing uncommon objects. Pt req'd mod A rarely for anomia or for cognition to think about what aspect of description most effective/salient for listener.   Assessment / Recommendations / Plan   Plan Continue with current plan of care   Progression Toward Goals   Progression toward goals Progressing toward goals          SLP Education - 01/16/16 1013    Education provided Yes   Education Details compenstions for anomia/dysnomia   Person(s) Educated Patient;Spouse   Methods Explanation;Demonstration   Comprehension Verbal cues required;Returned demonstration;Verbalized understanding  SLP Short Term Goals - 01/16/16 0945    SLP SHORT TERM GOAL #1   Title pt will tell SLP 3 compensations for anomia/dysnomia   Time 3   Period Weeks   Status On-going   SLP SHORT TERM GOAL #2   Title pt will demo emergent awareness of aphasic errors 90% of the time   Time 3   Period Weeks   Status On-going   SLP SHORT TERM GOAL #3   Title pt will demo understanding of 10 minute simple to mod-complex conversation with pt requests for repeats allowed   Time 3   Period Weeks   Status On-going          SLP Long Term Goals - 01/16/16  1021    SLP LONG TERM GOAL #1   Title pt will demo understanding of 10 minutes mod complex/complex conversation with pt requests for repeats allowed   Time 7   Period Weeks   Status On-going   SLP LONG TERM GOAL #2   Title pt will demo compensations to generate 10 minutes WFL mod complex/complex conversation   Time 7   Period Weeks   Status On-going          Plan - 01/16/16 1019    Clinical Impression Statement Pt presents with min-mod expressive aphasia and would cont to benefit from skilled ST to maximize receptive and expressive aphasia. Likely some cognitive deficits as well however SLP to focus on aphasia. Pt/family agree with this plan.   Speech Therapy Frequency 2x / week   Duration --  7 weeks   Treatment/Interventions SLP instruction and feedback;Compensatory strategies;Internal/external aids;Patient/family education;Functional tasks;Language facilitation;Cueing hierarchy   Potential to Achieve Goals Good   Potential Considerations Other (comment)  time post onset   Consulted and Agree with Plan of Care Patient;Family member/caregiver   Family Member Consulted husband        Problem List Patient Active Problem List   Diagnosis Date Noted  . ICH (intracerebral hemorrhage) ([place]) - R occipital 12/27/2015  . Atrial fibrillation with RVR ([place]) 12/27/2015  . Hyperlipidemia 12/27/2015  . Cognitive deficits 12/27/2015  . Hypothyroidism 12/27/2015  . Anxiety 12/27/2015  . Subarachnoid hemorrhage ([place]) - L frontal 12/25/2015  . Cerebellar stroke ([place]) 12/02/2014  . Osteopenia 12/01/2014  . Rhinitis, chronic 01/13/2014  . Breast cancer of upper-outer quadrant of right female breast ([place]) 09/26/2013    [facility] ,[place], [place]   01/16/2016, 10:21 AM  [facility] [address] [place], [place], [address] Phone: [phone]   Fax:  [phone]   Name: Barbara [name] MRN: [id number] Date of Birth:  1951/10/26

## 2016-01-16 NOTE — Patient Instructions (Signed)
  Please complete the assigned speech therapy homework and return it to your next session.  

## 2016-01-16 NOTE — Therapy (Signed)
[facility] [address] [place], [place], [address] Phone: [phone]   Fax:  [phone]  Occupational Therapy Treatment  Patient Details  Name: Barbara [name] MRN: [id number] Date of Birth: Apr 02, 1951 Referring Provider: Dr. Rosalin Hawking  Encounter Date: 01/16/2016      OT End of Session - 01/16/16 0905    Visit Number 3   Number of Visits 17   Date for OT Re-Evaluation 03/02/16   Authorization Type [employer], G-code needed   Authorization - Visit Number 3   Authorization - Number of Visits 10   OT Start Time [phone]   OT Stop Time 0930   OT Time Calculation (min) 38 min   Activity Tolerance Patient tolerated treatment well   Behavior During Therapy [facility] for tasks assessed/performed      Past Medical History  Diagnosis Date  . Breast cancer ([place])   . Hashimoto's thyroiditis     with goiter  . Hypercholesteremia   . 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  Narrowing of the 4th and 5th lumbar interspaces.   Marland Kitchen Spina bifida ([place])     occutta L5  . Anxiety   . GERD (gastroesophageal reflux disease)   . Headache(784.0)     hx migraines  . Varicose vein     Past Surgical History  Procedure Laterality Date  . Hernia repair  1976    Left groin  . Tubal ligation    . Cystoscopy with urethral caruncle    . Mastectomy w/ sentinel node biopsy Bilateral 10/13/2013    Procedure:  BILATERAL MASTECTOMY WITH RIGHT SENTINEL LYMPH NODE BIOPSY;  Surgeon: Merrie Roof, MD;  Location: [place];  Service: General;  Laterality: Bilateral;  . Portacath placement Left 10/13/2013    Procedure: INSERTION PORT-A-CATH;  Surgeon: Merrie Roof, MD;  Location: [place];  Service: General;  Laterality: Left;  . Breast reconstruction with placement of tissue expander and flex hd (acellular hydrated dermis) Bilateral 10/13/2013    Procedure: PLACEMENT OF BILATERAL TISSUE  EXPANDER FOR BREAST RECONSTRUCTION ;  Surgeon: Crissie Reese, MD;  Location: [place];  Service: Plastics;  Laterality: Bilateral;  . Wisdom tooth extraction  1990  . Port-a-cath removal Left 12/21/2014    Procedure: MINOR REMOVAL OF PORT-A-CATH;  Surgeon: Autumn Messing III, MD;  Location: [facility];  Service: General;  Laterality: Left;    There were no vitals filed for this visit.  Visit Diagnosis:  Cognitive deficits  Decreased coordination  Weakness of left hand      Subjective Assessment - 01/16/16 0853    Subjective  Pt worked in the yard some with taking breaks.  Pt reports cooking at home without difficulty.   Pertinent History hx of breast CA (2014) with chemo for 1 year and bilateral mastectomy; hx of word finding and concentration difficulties which improved after completing chemo; a-fib, hx of small punctate infarct L erebellum vs. demyelinating disease   Limitations receptive aphasia   Patient Stated Goals improve balance, be able to garden   Currently in Pain? No/denies            [facility] OT Assessment - 01/16/16 0001    Hand Function   Right Hand Grip (lbs) 60lbs   Left Hand Grip (lbs) 60lbs                  OT Treatments/Exercises (OP) -  01/16/16 0001    Cognitive Exercises   Financial Management Balancing Checkbook Pt balanced check book with 100% accuracy and only used calculator 1x, min incr time   Functional Word Problems involving time with 5/5 currect with 1 min questioning cue for set-up and incr time.  Pt to finish the remaining for homework.   Other Cognitive Exercises 1 Copying small peg design with L hand with good accuracy and coordination                  OT Short Term Goals - 01/16/16 0854    OT SHORT TERM GOAL #1   Title Pt will be independent with HEP for L hand coordination and strength.--check STGs 01/31/16   Status On-going   OT SHORT TERM GOAL #2   Title Pt will perform simple home maintenance tasks with no  LOB.   Status Achieved  01/16/16  gardening per pt report   OT SHORT TERM GOAL #3   Title Pt will perform simple cooking task with written directions mod I.   Status Achieved   OT SHORT TERM GOAL #4   Title Pt will perform simple financial management tasks without cueing with at least 95% accuracy.   Status Achieved  01/16/16           OT Long Term Goals - 01/16/16 0913    OT LONG TERM GOAL #1   Title Pt will perform mod complex home maintenance tasks mod I.--check LTGs 03/02/16   Time 8   Period Weeks   Status Achieved  01/16/16  has returned to gardening without LOB/difficulty (taking rest breaks after 44mn)   OT LONG TERM GOAL #2   Title Pt will perform simple-mod complex financial management tasks with at least 95% accuracy.   Time 8   Period Weeks   Status New   OT LONG TERM GOAL #3   Title Pt will be able to cook 2 items simultaneously safely and demonstrating good planning/divided attention.   Time 8   Period Weeks   Status New   OT LONG TERM GOAL #4   Title Pt will improve L grip strength by at least 5lbs to assist in opening containers/lifting objects.   Baseline R-60lbs, L-47lbs   Time 8   Period Weeks   Status Achieved  01/16/16:  60lbs   OT LONG TERM GOAL #5   Title Pt will improve coordination for ADLs as shown by improving time on 9-hole peg test by at least 5sec with L hand.   Baseline R-17.84sec, L-25.42sec   Time 8   Period Weeks   Status New               Plan - 01/16/16 0944    Clinical Impression Statement Pt is making excellent progress towards goals with STGs #2-4 and LTGs #1 and 4 met.   Plan check functional word problems involving time (last 5 items) as it was given for homework; cooking 2 items at the same time, Organizing Your Day worksheet if time   OT Home Exercise Plan issued coordination/ red putty 01/10/16   Consulted and Agree with Plan of Care Patient;Family member/caregiver        Problem List Patient Active Problem List    Diagnosis Date Noted  . ICH (intracerebral hemorrhage) ([place] - R occipital 12/27/2015  . Atrial fibrillation with RVR ([place] 12/27/2015  . Hyperlipidemia 12/27/2015  . Cognitive deficits 12/27/2015  . Hypothyroidism 12/27/2015  . Anxiety 12/27/2015  . Subarachnoid hemorrhage ([place] -  L frontal 12/25/2015  . Cerebellar stroke ([place]) 12/02/2014  . Osteopenia 12/01/2014  . Rhinitis, chronic 01/13/2014  . Breast cancer of upper-outer quadrant of right female breast [facility]) 09/26/2013    [facility] 01/16/2016, 9:49 AM  [facility] [address] [place], [place], [address] Phone: [phone]   Fax:  [phone]  Name: Barbara [name] MRN: [id number] Date of Birth: 12-24-1950  Vianne Bulls, OTR/L [facility] [address] [place], [place]  [address] [phone] phone [phone] 01/16/2016 9:49 AM

## 2016-01-17 ENCOUNTER — Ambulatory Visit: Payer: PPO | Admitting: Occupational Therapy

## 2016-01-17 ENCOUNTER — Ambulatory Visit: Payer: PPO | Admitting: Physical Therapy

## 2016-01-17 DIAGNOSIS — R269 Unspecified abnormalities of gait and mobility: Secondary | ICD-10-CM

## 2016-01-17 DIAGNOSIS — R278 Other lack of coordination: Secondary | ICD-10-CM

## 2016-01-17 DIAGNOSIS — R2681 Unsteadiness on feet: Secondary | ICD-10-CM

## 2016-01-17 DIAGNOSIS — R279 Unspecified lack of coordination: Secondary | ICD-10-CM

## 2016-01-17 DIAGNOSIS — R29898 Other symptoms and signs involving the musculoskeletal system: Secondary | ICD-10-CM

## 2016-01-17 DIAGNOSIS — R4189 Other symptoms and signs involving cognitive functions and awareness: Secondary | ICD-10-CM

## 2016-01-17 NOTE — Therapy (Signed)
[facility] [address] [place], [place], [address] Phone: [phone]   Fax:  [phone]  Occupational Therapy Treatment  Patient Details  Name: Barbara [name] MRN: [id number] Date of Birth: 08/07/1951 Referring Provider: Dr. Rosalin Hawking  Encounter Date: 01/17/2016      OT End of Session - 01/17/16 1704    Visit Number 4   Number of Visits 17   Date for OT Re-Evaluation 03/02/16   Authorization Type [employer], G-code needed   Authorization - Visit Number 4   Authorization - Number of Visits 10   OT Start Time [id number]   OT Stop Time 1615   OT Time Calculation (min) 40 min   Activity Tolerance Patient tolerated treatment well   Behavior During Therapy [facility] for tasks assessed/performed      Past Medical History  Diagnosis Date  . Breast cancer ([place])   . Hashimoto's thyroiditis     with goiter  . Hypercholesteremia   . 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  Narrowing of the 4th and 5th lumbar interspaces.   Marland Kitchen Spina bifida ([place])     occutta L5  . Anxiety   . GERD (gastroesophageal reflux disease)   . Headache(784.0)     hx migraines  . Varicose vein     Past Surgical History  Procedure Laterality Date  . Hernia repair  1976    Left groin  . Tubal ligation    . Cystoscopy with urethral caruncle    . Mastectomy w/ sentinel node biopsy Bilateral 10/13/2013    Procedure:  BILATERAL MASTECTOMY WITH RIGHT SENTINEL LYMPH NODE BIOPSY;  Surgeon: Merrie Roof, MD;  Location: [place];  Service: General;  Laterality: Bilateral;  . Portacath placement Left 10/13/2013    Procedure: INSERTION PORT-A-CATH;  Surgeon: Merrie Roof, MD;  Location: [place];  Service: General;  Laterality: Left;  . Breast reconstruction with placement of tissue expander and flex hd (acellular hydrated dermis) Bilateral 10/13/2013    Procedure: PLACEMENT OF BILATERAL TISSUE  EXPANDER FOR BREAST RECONSTRUCTION ;  Surgeon: Crissie Reese, MD;  Location: [place];  Service: Plastics;  Laterality: Bilateral;  . Wisdom tooth extraction  1990  . Port-a-cath removal Left 12/21/2014    Procedure: MINOR REMOVAL OF PORT-A-CATH;  Surgeon: Autumn Messing III, MD;  Location: [place];  Service: General;  Laterality: Left;    There were no vitals filed for this visit.  Visit Diagnosis:  Cognitive deficits  Decreased coordination  Weakness of left hand      Subjective Assessment - 01/17/16 1602    Pertinent History hx of breast CA (2014) with chemo for 1 year and bilateral mastectomy; hx of word finding and concentration difficulties which improved after completing chemo; a-fib, hx of small punctate infarct L erebellum vs. demyelinating disease   Patient Stated Goals improve balance, be able to garden   Currently in Pain? No/denies        Treatment: Organizing your day task, with 1 item omitted, pt required increased time for task and pt had difficulty reasoning why certain items should be in the sequence they were. Completing a simple puzzle with min v.c. for organizing/sequencing the task. 100% accuracy with homework.                        OT Short Term Goals - 01/16/16 [id number]  OT SHORT TERM GOAL #1   Title Pt will be independent with HEP for L hand coordination and strength.--check STGs 01/31/16   Status On-going   OT SHORT TERM GOAL #2   Title Pt will perform simple home maintenance tasks with no LOB.   Status Achieved  01/16/16  gardening per pt report   OT SHORT TERM GOAL #3   Title Pt will perform simple cooking task with written directions mod I.   Status Achieved   OT SHORT TERM GOAL #4   Title Pt will perform simple financial management tasks without cueing with at least 95% accuracy.   Status Achieved  01/16/16           OT Long Term Goals - 01/16/16 0913    OT LONG TERM GOAL #1   Title Pt will perform mod complex  home maintenance tasks mod I.--check LTGs 03/02/16   Time 8   Period Weeks   Status Achieved  01/16/16  has returned to gardening without LOB/difficulty (taking rest breaks after 2min)   OT LONG TERM GOAL #2   Title Pt will perform simple-mod complex financial management tasks with at least 95% accuracy.   Time 8   Period Weeks   Status New   OT LONG TERM GOAL #3   Title Pt will be able to cook 2 items simultaneously safely and demonstrating good planning/divided attention.   Time 8   Period Weeks   Status New   OT LONG TERM GOAL #4   Title Pt will improve L grip strength by at least 5lbs to assist in opening containers/lifting objects.   Baseline R-60lbs, L-47lbs   Time 8   Period Weeks   Status Achieved  01/16/16:  60lbs   OT LONG TERM GOAL #5   Title Pt will improve coordination for ADLs as shown by improving time on 9-hole peg test by at least 5sec with L hand.   Baseline R-17.84sec, L-25.42sec   Time 8   Period Weeks   Status New               Plan - 01/17/16 1655    Clinical Impression Statement Pt is progressing towards goals. She reports cooking a simple meal    Pt will benefit from skilled therapeutic intervention in order to improve on the following deficits (Retired) Decreased cognition;Decreased strength;Decreased mobility;Decreased balance;Decreased knowledge of use of DME;Impaired UE functional use;Decreased coordination   Rehab Potential Good   Clinical Impairments Affecting Rehab Potential ?awareness   OT Frequency 2x / week   OT Duration 8 weeks   OT Treatment/Interventions Self-care/ADL training;Therapeutic exercise;Functional Mobility Training;Patient/family education;Neuromuscular education;Manual Therapy;Therapeutic exercises;Splinting;Energy conservation;Cryotherapy;DME and/or AE instruction;Therapeutic activities;Cognitive remediation/compensation;Moist Heat;Electrical Stimulation;Fluidtherapy;Passive range of motion   Plan cooking for 2 items at the  same time   OT Home Exercise Plan issued coordination/ red putty 01/10/16   Consulted and Agree with Plan of Care Patient;Family member/caregiver        Problem List Patient Active Problem List   Diagnosis Date Noted  . ICH (intracerebral hemorrhage) ([place]) - R occipital 12/27/2015  . Atrial fibrillation with RVR ([place]) 12/27/2015  . Hyperlipidemia 12/27/2015  . Cognitive deficits 12/27/2015  . Hypothyroidism 12/27/2015  . Anxiety 12/27/2015  . Subarachnoid hemorrhage ([place]) - L frontal 12/25/2015  . Cerebellar stroke ([place]) 12/02/2014  . Osteopenia 12/01/2014  . Rhinitis, chronic 01/13/2014  . Breast cancer of upper-outer quadrant of right female breast ([place]) 09/26/2013    RINE,KATHRYN 01/17/2016, 5:07 PM Theone Murdoch, OTR/L [phone]  [phone] Phone: [phone] 5:07 PM 01/17/2016 [facility] [address] [place], [place], [address] Phone: [phone]   Fax:  [phone]  Name: Barbara [name] MRN: [id number] Date of Birth: [birth year]

## 2016-01-17 NOTE — Patient Instructions (Signed)
Gaze Stabilization: Standing Feet Apart    Feet shoulder width apart, keeping eyes on target on wall __5__ feet away, tilt head down 15-30 and move head side to side for __30__ seconds. Repeat while moving head up and down for __30__ seconds. Do __2__ sessions per day. Repeat using target on pattern background.  Copyright  VHI. All rights reserved.  Feet Apart (Compliant Surface) Arm Motion - Eyes Closed    Stand on compliant surface: ___30 sec_____, feet shoulder width apart.  Repeat _3___ times per session. Do __2__ sessions per day.  Copyright  VHI. All rights reserved.

## 2016-01-17 NOTE — Therapy (Signed)
[facility] [address] [place], [place], [address] Phone: [phone]   Fax:  [phone]  Physical Therapy Treatment  Patient Details  Name: Barbara [name] MRN: [id number] Date of Birth: 25-May-1951 Referring Provider: Rosalin Hawking  Encounter Date: 01/17/2016      PT End of Session - 01/17/16 1548    Visit Number 4   Number of Visits 17   Date for PT Re-Evaluation 03/02/16   Authorization Type [employer]/Medicare   Authorization Time Period G codes every 10th visit   PT Start Time 1449   PT Stop Time 1530   PT Time Calculation (min) 41 min   Equipment Utilized During Treatment Gait belt   Activity Tolerance Patient tolerated treatment well   Behavior During Therapy [facility] for tasks assessed/performed      Past Medical History  Diagnosis Date  . Breast cancer ([place])   . Hashimoto's thyroiditis     with goiter  . Hypercholesteremia   . 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  Narrowing of the 4th and 5th lumbar interspaces.   Marland Kitchen Spina bifida ([place])     occutta L5  . Anxiety   . GERD (gastroesophageal reflux disease)   . Headache(784.0)     hx migraines  . Varicose vein     Past Surgical History  Procedure Laterality Date  . Hernia repair  1976    Left groin  . Tubal ligation    . Cystoscopy with urethral caruncle    . Mastectomy w/ sentinel node biopsy Bilateral 10/13/2013    Procedure:  BILATERAL MASTECTOMY WITH RIGHT SENTINEL LYMPH NODE BIOPSY;  Surgeon: Merrie Roof, MD;  Location: [facility];  Service: General;  Laterality: Bilateral;  . Portacath placement Left 10/13/2013    Procedure: INSERTION PORT-A-CATH;  Surgeon: Merrie Roof, MD;  Location: [facility] [place];  Service: General;  Laterality: Left;  . Breast reconstruction with placement of tissue expander and flex hd (acellular hydrated dermis) Bilateral 10/13/2013    Procedure: PLACEMENT OF  BILATERAL TISSUE EXPANDER FOR BREAST RECONSTRUCTION ;  Surgeon: Crissie Reese, MD;  Location: [facility];  Service: Plastics;  Laterality: Bilateral;  . Wisdom tooth extraction  1990  . Port-a-cath removal Left 12/21/2014    Procedure: MINOR REMOVAL OF PORT-A-CATH;  Surgeon: Autumn Messing III, MD;  Location: [place];  Service: General;  Laterality: Left;    There were no vitals filed for this visit.  Visit Diagnosis:  Abnormality of gait  Unsteadiness      Subjective Assessment - 01/17/16 1452    Subjective Pt arrives today with no significant complaints since last session. Has been out gardening and walking up to 30 min with husband. Denies falls.   Patient is accompained by: Family member   Patient Stated Goals I want to get my gait back, and carry things, bend over, and get down so I can work in my garden again.   Currently in Pain? No/denies             [facility] Adult PT Treatment/Exercise - 01/17/16 1538    Ambulation/Gait   Ambulation/Gait Yes   Ambulation/Gait Assistance 6: Modified independent (Device/Increase time);4: Min guard   Ambulation Distance (Feet) 650 Feet   Assistive device None   Gait Pattern Step-through pattern;Decreased arm swing - right;Decreased arm swing - left   Ambulation Surface Level;Indoor;Outdoor;Paved;Other (comment);Unlevel  pine straw   Gait  Comments Pt ambulated outdoors 600' with min guard over grass and paved level surfaces and inclines. Pt able to negotiate curb and pine straw with min A. Pt ambulated min guard while performing horizontal head turns to locate different cars and read off license plate numbers. Pt demonstrates difficulty finding license plate numbers in immediate left visual field despite PT cues. Pt negotiated ramp in clinic with blue mat placed on it x4 with min guard from PT for pt safety.   Neuro Re-ed    Neuro Re-ed Details  Pt performed large amplitude side lunge with head turns x10 bilaterally. Pt performed large  amplitude altnernating side lunge x4 each side with verbal cues to turn head and pick up L foot. Pt demonstrates inc L foot drag to return to midline. Pt demonstrates inc postural sway/overshoot when returning from side lunge to L. Pt performed step up/over onto [employer] pad in A/P direction, med/lat direction x12 each to work on dynamic balance stepping over compliant surface.         Vestibular Treatment/Exercise - 01/17/16 1544    Vestibular Treatment/Exercise   Vestibular Treatment Provided Gaze   Gaze Exercises X1 Viewing Horizontal;X1 Viewing Vertical   X1 Viewing Horizontal   Foot Position standing   Comments Pt performed 5x30 sec VORx1 horizontal head turns in standing without glasses. Pt displayed inc cervical guarding and difficulty with movement. Pt moved back to approximately 5 ft from target to allow inc speed and amplitude of head movement   X1 Viewing Vertical   Foot Position standing   Comments Pt performed 5x30 sec VORx1 with vertical head turns in standing without glasses. Pt displayed inc cervical guarding and difficulty with movement. Pt moved back to approximately 5 ft from target to allow inc speed and amplitude of head movement            PT Short Term Goals - 01/11/16 1340    PT SHORT TERM GOAL #1   Title The patient will demonstrate initiation of HEP with supervision and assistance from husband as needed to maximize functional gains made in PT.   Baseline TARGET DATE = 01/30/16   Time 4   Period Weeks   Status On-going   PT SHORT TERM GOAL #2   Title The patient will demonstrate ability to negotiate 12 steps with 1 handrail assistance mod I and reciprocal gait pattern in order to safely reach second floor of home for bathing.   Time 4   Period Weeks   Status On-going   PT SHORT TERM GOAL #3   Title The patient will ambulate 150' indoors over level surfaces at supervision level while performing horizontal head turns x2 each direction and vertical head turns x2  each direction to demonstrate improvements and safety with dynamic gait.   Time 4   Period Weeks   Status On-going   PT SHORT TERM GOAL #4   Title The patient will demonstrate ability to move from quadruped position to tall kneeling position independently 5x with no overt LOB to simulate gardening tasks at home.   Baseline MET = 01/11/16   Time 4   Period Weeks   Status Achieved   PT SHORT TERM GOAL #5   Title The patient will improve 10 m walk test to at least 2.62 ft/sec in order to demonstrate improvent in gait speed and become safe community ambulator.   Baseline 2.54 ft/sec   Time 8   Period Weeks   Status On-going  PT Long Term Goals - 01/02/16 1536    PT LONG TERM GOAL #1   Title The patient will demonstrate independence and safety with HEP in order to maximize functional gains made in PT.   Baseline TARGET DATE = 03/02/16   Time 8   Period Weeks   Status New   PT LONG TERM GOAL #2   Title The patient will return to gardening at home including kneeling, bending over, and sitting on stool in order to return to leisure activities safely and independently.   Time 8   Period Weeks   Status New   PT LONG TERM GOAL #3   Title The patient will improve DGI score to at least 19 in order to demonstrate improvements and safety with dynamic gait.   Baseline DGI = 14   Time 8   Period Weeks   Status New   PT LONG TERM GOAL #4   Title The patient will ambulate 150' outdoors over paved surfaces and grass independently in order to demonstrate independence with community ambulation and safety going outdoors to her garden.   Baseline The patient is currently unable to walk outdoors to her garden without assistance   Time 8   Period Weeks   Status New   PT LONG TERM GOAL #5   Title The patient will improve Neuro QOL LE by at least 5 points to demonstrate improved QOL and LE function.   Baseline initial = 48.9   Time 8   Period Weeks            Plan - 01/17/16 1534     Clinical Impression Statement The patient demonstrates continued improvements in balance and fucntional activities. She has increased her walking distance at home over various surfaces and is able to garden. Demonstrates difficulty performing VORx1 with increased guarding of cervical musculature. Continues to have difficulty with large dynamic movements and head turns.   Pt will benefit from skilled therapeutic intervention in order to improve on the following deficits Abnormal gait;Decreased balance;Decreased mobility;Decreased strength;Difficulty walking;Impaired vision/preception   Rehab Potential Good   Clinical Impairments Affecting Rehab Potential good support system - husband   PT Frequency 2x / week   PT Duration 8 weeks   PT Treatment/Interventions ADLs/Self Care Home Management;Biofeedback;Canalith Repostioning;Electrical Stimulation;Moist Heat;Cryotherapy;DME Instruction;Gait training;Stair training;Functional mobility training;Therapeutic activities;Therapeutic exercise;Balance training;Neuromuscular re-education;Patient/family education;Orthotic Fit/Training;Manual techniques;Passive range of motion;Vestibular;Visual/perceptual remediation/compensation   PT Next Visit Plan large steps with head turns on compliant surface, gait EC/head turns in various heights, vestibular training - EC narow base   PT Home Exercise Plan VOR x1 horizontal/vertical head turns, feet together wide base on compliant surface   Consulted and Agree with Plan of Care Patient;Family member/caregiver   Family Member Consulted Husband - Ed        Problem List Patient Active Problem List   Diagnosis Date Noted  . ICH (intracerebral hemorrhage) ([place]) - R occipital 12/27/2015  . Atrial fibrillation with RVR ([place]) 12/27/2015  . Hyperlipidemia 12/27/2015  . Cognitive deficits 12/27/2015  . Hypothyroidism 12/27/2015  . Anxiety 12/27/2015  . Subarachnoid hemorrhage ([place]) - L frontal 12/25/2015  . Cerebellar  stroke ([place]) 12/02/2014  . Osteopenia 12/01/2014  . Rhinitis, chronic 01/13/2014  . Breast cancer of upper-outer quadrant of right female breast ([place]) 09/26/2013    De Nurse, SPT 01/17/2016, 3:49 PM  [facility] [address] [place], [place], [address] Phone: [phone]   Fax:  [phone]  Name: LUNDEN STIEBER MRN:  [id number] Date of Birth: 21-Nov-1951

## 2016-01-18 ENCOUNTER — Ambulatory Visit: Payer: PPO

## 2016-01-18 ENCOUNTER — Ambulatory Visit: Payer: PPO | Admitting: Rehabilitative and Restorative Service Providers"

## 2016-01-18 DIAGNOSIS — R2681 Unsteadiness on feet: Secondary | ICD-10-CM

## 2016-01-18 DIAGNOSIS — R4701 Aphasia: Secondary | ICD-10-CM

## 2016-01-18 DIAGNOSIS — R269 Unspecified abnormalities of gait and mobility: Secondary | ICD-10-CM | POA: Diagnosis not present

## 2016-01-18 NOTE — Therapy (Signed)
[facility] [address] [place], [place], [address] Phone: [phone]   Fax:  [phone]  Physical Therapy Treatment  Patient Details  Name: Barbara [name] MRN: [id number] Date of Birth: 09/06/1951 Referring Provider: Rosalin Hawking  Encounter Date: 01/18/2016      PT End of Session - 01/18/16 1038    Visit Number 5   Number of Visits 17   Date for PT Re-Evaluation 03/02/16   Authorization Type [employer]/Medicare   Authorization Time Period G codes every 10th visit   PT Start Time 0931   PT Stop Time 1015   PT Time Calculation (min) 44 min   Equipment Utilized During Treatment Gait belt   Activity Tolerance Patient tolerated treatment well   Behavior During Therapy [facility] for tasks assessed/performed      Past Medical History  Diagnosis Date  . Breast cancer ([place])   . Hashimoto's thyroiditis     with goiter  . Hypercholesteremia   . 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  Narrowing of the 4th and 5th lumbar interspaces.   Marland Kitchen Spina bifida ([place])     occutta L5  . Anxiety   . GERD (gastroesophageal reflux disease)   . Headache(784.0)     hx migraines  . Varicose vein     Past Surgical History  Procedure Laterality Date  . Hernia repair  1976    Left groin  . Tubal ligation    . Cystoscopy with urethral caruncle    . Mastectomy w/ sentinel node biopsy Bilateral 10/13/2013    Procedure:  BILATERAL MASTECTOMY WITH RIGHT SENTINEL LYMPH NODE BIOPSY;  Surgeon: Merrie Roof, MD;  Location: [facility];  Service: General;  Laterality: Bilateral;  . Portacath placement Left 10/13/2013    Procedure: INSERTION PORT-A-CATH;  Surgeon: Merrie Roof, MD;  Location: [facility];  Service: General;  Laterality: Left;  . Breast reconstruction with placement of tissue expander and flex hd (acellular hydrated dermis) Bilateral 10/13/2013    Procedure: PLACEMENT OF  BILATERAL TISSUE EXPANDER FOR BREAST RECONSTRUCTION ;  Surgeon: Crissie Reese, MD;  Location: [facility];  Service: Plastics;  Laterality: Bilateral;  . Wisdom tooth extraction  1990  . Port-a-cath removal Left 12/21/2014    Procedure: MINOR REMOVAL OF PORT-A-CATH;  Surgeon: Autumn Messing III, MD;  Location: [facility];  Service: General;  Laterality: Left;    There were no vitals filed for this visit.  Visit Diagnosis:  Abnormality of gait  Unsteadiness      Subjective Assessment - 01/18/16 0932    Subjective Pt arrives today with no new complaints. Denies falls. Still has difficulty with corner exercises balancing on compliant surface.   Patient is accompained by: Family member   Patient Stated Goals I want to get my gait back, and carry things, bend over, and get down so I can work in my garden again.   Currently in Pain? No/denies          [facility] Adult PT Treatment/Exercise - 01/18/16 1006    Neuro Re-ed    Neuro Re-ed Details  Performed the following in corner for pt safety: static standing on foam Airex pad feet apart eyes open 1x30 sec, feet apart eyes closed 2x30 sec, feet together eyes closed 2x30 sec. Pt performed "wall bump" x10 eyes open with verbal cues to lead with hips, x10 eyes closed to promote inc use  of hip strategy. Pt performed the following in parallel bars for pt safety: stationary step up/over BOSU in ant/post direction bilateral x15 each leg with verbal cues to maintain foot contact with BOSU. Static standing on foam beam with toes off 3x30 sec and min guard from PT for pt safety. Performed stepping over foam beam with narrow base x8 trips with verbal cues to pause and count to 2 seconds to decrease pt use of momentum/speed to inc challenge on balance. Performed static standing on rockerboard in ant/post direction and med lat direction 3x30 sec each direction with perturbations given from PT to incorporate hip strategy. VOR x1 horizontal head turns in standing  4x30 sec, 2 reps glasses on, 2 reps glasses off. VOR x1 vertical head turns 3x30 sec with glasses off. Verbal cues to inc speed of movement and move further from target.           PT Education - 01/18/16 1038    Education provided Yes   Education Details Pt instructed to perform VOR exercises horizontal head turns with glasses on and off, vertical head turns with glasses off.    Person(s) Educated Patient;Spouse   Methods Explanation;Demonstration;Verbal cues;Handout   Comprehension Verbalized understanding;Returned demonstration          PT Short Term Goals - 01/11/16 1340    PT SHORT TERM GOAL #1   Title The patient will demonstrate initiation of HEP with supervision and assistance from husband as needed to maximize functional gains made in PT.   Baseline TARGET DATE = 01/30/16   Time 4   Period Weeks   Status On-going   PT SHORT TERM GOAL #2   Title The patient will demonstrate ability to negotiate 12 steps with 1 handrail assistance mod I and reciprocal gait pattern in order to safely reach second floor of home for bathing.   Time 4   Period Weeks   Status On-going   PT SHORT TERM GOAL #3   Title The patient will ambulate 150' indoors over level surfaces at supervision level while performing horizontal head turns x2 each direction and vertical head turns x2 each direction to demonstrate improvements and safety with dynamic gait.   Time 4   Period Weeks   Status On-going   PT SHORT TERM GOAL #4   Title The patient will demonstrate ability to move from quadruped position to tall kneeling position independently 5x with no overt LOB to simulate gardening tasks at home.   Baseline MET = 01/11/16   Time 4   Period Weeks   Status Achieved   PT SHORT TERM GOAL #5   Title The patient will improve 10 m walk test to at least 2.62 ft/sec in order to demonstrate improvent in gait speed and become safe community ambulator.   Baseline 2.54 ft/sec   Time 8   Period Weeks   Status  On-going           PT Long Term Goals - 01/02/16 1536    PT LONG TERM GOAL #1   Title The patient will demonstrate independence and safety with HEP in order to maximize functional gains made in PT.   Baseline TARGET DATE = 03/02/16   Time 8   Period Weeks   Status New   PT LONG TERM GOAL #2   Title The patient will return to gardening at home including kneeling, bending over, and sitting on stool in order to return to leisure activities safely and independently.   Time 8  Period Weeks   Status New   PT LONG TERM GOAL #3   Title The patient will improve DGI score to at least 19 in order to demonstrate improvements and safety with dynamic gait.   Baseline DGI = 14   Time 8   Period Weeks   Status New   PT LONG TERM GOAL #4   Title The patient will ambulate 150' outdoors over paved surfaces and grass independently in order to demonstrate independence with community ambulation and safety going outdoors to her garden.   Baseline The patient is currently unable to walk outdoors to her garden without assistance   Time 8   Period Weeks   Status New   PT LONG TERM GOAL #5   Title The patient will improve Neuro QOL LE by at least 5 points to demonstrate improved QOL and LE function.   Baseline initial = 48.9   Time 8   Period Weeks           Plan - 01/18/16 1039    Clinical Impression Statement The patient demonstrates in session improvements in balance and carry over during session between tasks. Noted increased use of ankle strategy for static stance on compliant surface, pt given wall bumps eyes open/closed and large perturbations on rockerboard to incorporate hip strategy and able to demonstrate improvements in use of hip strategy when retesting static balance on compliant surface. Continued difficulty, decreased speed with VOR exercises.   Pt will benefit from skilled therapeutic intervention in order to improve on the following deficits Abnormal gait;Decreased balance;Decreased  mobility;Decreased strength;Difficulty walking;Impaired vision/preception   Rehab Potential Good   Clinical Impairments Affecting Rehab Potential good support system - husband   PT Frequency 2x / week   PT Duration 8 weeks   PT Treatment/Interventions ADLs/Self Care Home Management;Biofeedback;Canalith Repostioning;Electrical Stimulation;Moist Heat;Cryotherapy;DME Instruction;Gait training;Stair training;Functional mobility training;Therapeutic activities;Therapeutic exercise;Balance training;Neuromuscular re-education;Patient/family education;Orthotic Fit/Training;Manual techniques;Passive range of motion;Vestibular;Visual/perceptual remediation/compensation   PT Next Visit Plan VOR, gaze stability exercises? tall kneeling on BOSU with reaching activities/simulate gardening. High level balance - challenge vestibular system, rockerboard eyes closed perturbations, rockerboard/BOSU dynamic activities i.e. ball toss, step over. Gaze stability with gait.   PT Home Exercise Plan VOR x1 horizontal/vertical head turns, feet together wide base on compliant surface, wall bumps   Consulted and Agree with Plan of Care Patient;Family member/caregiver   Family Member Consulted Husband - Ed        Problem List Patient Active Problem List   Diagnosis Date Noted  . ICH (intracerebral hemorrhage) ([place]) - R occipital 12/27/2015  . Atrial fibrillation with RVR ([place]) 12/27/2015  . Hyperlipidemia 12/27/2015  . Cognitive deficits 12/27/2015  . Hypothyroidism 12/27/2015  . Anxiety 12/27/2015  . Subarachnoid hemorrhage ([place]) - L frontal 12/25/2015  . Cerebellar stroke ([place]) 12/02/2014  . Osteopenia 12/01/2014  . Rhinitis, chronic 01/13/2014  . Breast cancer of upper-outer quadrant of right female breast ([place]) 09/26/2013    De Nurse, SPT 01/18/2016, 10:45 AM  [facility] [address] [place], [place], [address] Phone: [phone]   Fax:   [phone]  Name: NICHELE SLAWSON MRN: [id number] Date of Birth: 26-Feb-1951

## 2016-01-18 NOTE — Therapy (Signed)
[facility] [address] [place], [place], [address] Phone: [phone]   Fax:  [phone]  Speech Language Pathology Treatment  Patient Details  Name: Barbara [name] MRN: [id number] Date of Birth: [birth year] No Data Recorded  Encounter Date: 01/18/2016      End of Session - 01/18/16 1431    Visit Number 4   Number of Visits 17   Date for SLP Re-Evaluation 03/03/16   SLP Start Time 0847   SLP Stop Time  0930   SLP Time Calculation (min) 43 min   Activity Tolerance Patient tolerated treatment well      Past Medical History  Diagnosis Date  . Breast cancer ([place])   . Hashimoto's thyroiditis     with goiter  . Hypercholesteremia   . 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  Narrowing of the 4th and 5th lumbar interspaces.   Marland Kitchen Spina bifida ([place])     occutta L5  . Anxiety   . GERD (gastroesophageal reflux disease)   . Headache(784.0)     hx migraines  . Varicose vein     Past Surgical History  Procedure Laterality Date  . Hernia repair  1976    Left groin  . Tubal ligation    . Cystoscopy with urethral caruncle    . Mastectomy w/ sentinel node biopsy Bilateral 10/13/2013    Procedure:  BILATERAL MASTECTOMY WITH RIGHT SENTINEL LYMPH NODE BIOPSY;  Surgeon: Merrie Roof, MD;  Location: [facility];  Service: General;  Laterality: Bilateral;  . Portacath placement Left 10/13/2013    Procedure: INSERTION PORT-A-CATH;  Surgeon: Merrie Roof, MD;  Location: [facility];  Service: General;  Laterality: Left;  . Breast reconstruction with placement of tissue expander and flex hd (acellular hydrated dermis) Bilateral 10/13/2013    Procedure: PLACEMENT OF BILATERAL TISSUE EXPANDER FOR BREAST RECONSTRUCTION ;  Surgeon: Crissie Reese, MD;  Location: [facility];  Service: Plastics;  Laterality: Bilateral;  . Wisdom tooth extraction  1990  . Port-a-cath removal Left 12/21/2014   Procedure: MINOR REMOVAL OF PORT-A-CATH;  Surgeon: Autumn Messing III, MD;  Location: [facility];  Service: General;  Laterality: Left;    There were no vitals filed for this visit.  Visit Diagnosis: Aphasia      Subjective Assessment - 01/18/16 0848    Currently in Pain? No/denies               ADULT SLP TREATMENT - 01/18/16 0848    General Information   Behavior/Cognition Alert;Cooperative;Pleasant mood   Cognitive-Linquistic Treatment   Treatment focused on Aphasia   Skilled Treatment SLP engaged pt in 12 minutes of simple to mod complex conversation with understanding demonstrating 100%. She told SLP three compensations for word finding. In mod complex description tasks pt req'd extra time consistently but was 90% successful. She req'd cues from SLP for rare vague speech.   Assessment / Recommendations / Plan   Plan Continue with current plan of care   Progression Toward Goals   Progression toward goals Progressing toward goals            SLP Short Term Goals - 01/18/16 0900    SLP SHORT TERM GOAL #1   Title pt will tell SLP 3 compensations for anomia/dysnomia   Time --   Period --   Status Achieved   SLP SHORT TERM GOAL #2   Title  pt will demo emergent awareness of aphasic errors 90% of the time   Time 3   Period Weeks   Status On-going   SLP SHORT TERM GOAL #3   Title pt will demo understanding of 10 minute simple to mod-complex conversation with pt requests for repeats allowed   Status Achieved          SLP Long Term Goals - 01/18/16 1432    SLP LONG TERM GOAL #1   Title pt will demo understanding of 10 minutes mod complex/complex conversation with pt requests for repeats allowed   Time 7   Period Weeks   Status On-going   SLP LONG TERM GOAL #2   Title pt will demo compensations to generate 10 minutes WFL mod complex/complex conversation   Time 7   Period Weeks   Status On-going          Plan - 01/18/16 1431    Clinical  Impression Statement Pt presents with min-mod expressive aphasia and would cont to benefit from skilled ST to maximize receptive and expressive aphasia. Likely some cognitive deficits as well however SLP to focus on aphasia. Pt/family agree with this plan.   Speech Therapy Frequency 2x / week   Duration --  7 weeks   Treatment/Interventions SLP instruction and feedback;Compensatory strategies;Internal/external aids;Patient/family education;Functional tasks;Language facilitation;Cueing hierarchy   Potential to Achieve Goals Good   Potential Considerations Other (comment)  time post onset   Consulted and Agree with Plan of Care Patient;Family member/caregiver   Family Member Consulted husband        Problem List Patient Active Problem List   Diagnosis Date Noted  . ICH (intracerebral hemorrhage) ([place]) - R occipital 12/27/2015  . Atrial fibrillation with RVR ([place]) 12/27/2015  . Hyperlipidemia 12/27/2015  . Cognitive deficits 12/27/2015  . Hypothyroidism 12/27/2015  . Anxiety 12/27/2015  . Subarachnoid hemorrhage ([place]) - L frontal 12/25/2015  . Cerebellar stroke ([place]) 12/02/2014  . Osteopenia 12/01/2014  . Rhinitis, chronic 01/13/2014  . Breast cancer of upper-outer quadrant of right female breast ([place]) 09/26/2013    [facility] ,[place], [place]  01/18/2016, 2:33 PM  [name] [address] [place], [place], [address] Phone: [phone]   Fax:  [phone]   Name: Barbara [name] MRN: [id number] Date of Birth: [birth year]

## 2016-01-21 ENCOUNTER — Encounter: Payer: Self-pay | Admitting: Occupational Therapy

## 2016-01-21 ENCOUNTER — Ambulatory Visit: Payer: PPO | Admitting: Physical Therapy

## 2016-01-21 ENCOUNTER — Ambulatory Visit: Payer: PPO | Admitting: Occupational Therapy

## 2016-01-21 ENCOUNTER — Ambulatory Visit: Payer: PPO

## 2016-01-21 DIAGNOSIS — R2681 Unsteadiness on feet: Secondary | ICD-10-CM

## 2016-01-21 DIAGNOSIS — R269 Unspecified abnormalities of gait and mobility: Secondary | ICD-10-CM | POA: Diagnosis not present

## 2016-01-21 DIAGNOSIS — R4701 Aphasia: Secondary | ICD-10-CM

## 2016-01-21 DIAGNOSIS — R4189 Other symptoms and signs involving cognitive functions and awareness: Secondary | ICD-10-CM

## 2016-01-21 NOTE — Therapy (Signed)
[facility] [address] [place], [place], [address] Phone: [phone]   Fax:  [phone]  Physical Therapy Treatment  Patient Details  Name: Barbara [name] MRN: [id number] Date of Birth: [birth year] Referring Provider: Rosalin Hawking  Encounter Date: 01/21/2016      PT End of Session - 01/21/16 2007    Visit Number 6   Number of Visits 17   Date for PT Re-Evaluation 03/02/16   Authorization Type [employer]/Medicare   Authorization Time Period G codes every 10th visit   PT Start Time 1401   PT Stop Time 1445   PT Time Calculation (min) 44 min   Equipment Utilized During Treatment Gait belt   Activity Tolerance Patient tolerated treatment well   Behavior During Therapy [facility] for tasks assessed/performed      Past Medical History  Diagnosis Date  . Breast cancer ([place])   . Hashimoto's thyroiditis     with goiter  . Hypercholesteremia   . 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  Narrowing of the 4th and 5th lumbar interspaces.   Marland Kitchen Spina bifida ([place])     occutta L5  . Anxiety   . GERD (gastroesophageal reflux disease)   . Headache(784.0)     hx migraines  . Varicose vein     Past Surgical History  Procedure Laterality Date  . Hernia repair  1976    Left groin  . Tubal ligation    . Cystoscopy with urethral caruncle    . Mastectomy w/ sentinel node biopsy Bilateral 10/13/2013    Procedure:  BILATERAL MASTECTOMY WITH RIGHT SENTINEL LYMPH NODE BIOPSY;  Surgeon: Merrie Roof, MD;  Location: [place];  Service: General;  Laterality: Bilateral;  . Portacath placement Left 10/13/2013    Procedure: INSERTION PORT-A-CATH;  Surgeon: Merrie Roof, MD;  Location: [place];  Service: General;  Laterality: Left;  . Breast reconstruction with placement of tissue expander and flex hd (acellular hydrated dermis) Bilateral 10/13/2013    Procedure: PLACEMENT OF  BILATERAL TISSUE EXPANDER FOR BREAST RECONSTRUCTION ;  Surgeon: Crissie Reese, MD;  Location: [place];  Service: Plastics;  Laterality: Bilateral;  . Wisdom tooth extraction  1990  . Port-a-cath removal Left 12/21/2014    Procedure: MINOR REMOVAL OF PORT-A-CATH;  Surgeon: Autumn Messing III, MD;  Location: [place];  Service: General;  Laterality: Left;    There were no vitals filed for this visit.  Visit Diagnosis:  Abnormality of gait  Unsteadiness      Subjective Assessment - 01/21/16 1402    Subjective Pt reports she drove for the first time over the weekend, stating, "It was less than a mile." Pt wore lift in L shoe today.   Patient is accompained by: Family member   Patient Stated Goals I want to get my gait back, and carry things, bend over, and get down so I can work in my garden again.   Currently in Pain? No/denies                Vestibular Assessment - 01/21/16 0001    Occulomotor Exam   Occulomotor Alignment Abnormal  R eye appears slightly adducted   Spontaneous Absent   Gaze-induced Absent   Smooth Pursuits Comment  R eye with saccadic smooth pursuits in R superior quadrant   Saccades Poor trajectory  Significant undershooting from superior to midline   Comment  Visual tracking: noted pt difficulty holding gaze in R superior quadrant. Convergence abnormal.                 [facility] Adult PT Treatment/Exercise - 01/21/16 0001    Ambulation/Gait   Gait Comments Gait 2 x100' while tossing large ball upward and catching with BUE's. With cueing, pt initially able to visually fixate on ball while tossing; however, did exhibit difficulty continuing to visually fixate on ball while concurrently ambulating.   Neuro Re-ed    Neuro Re-ed Details  Tall kneeling forward/retro stepping 2 x5 reps each; lateral stepping 3 x3 steps per direction without UE support. Without UE support, transitioned from tall kneeling <> R half kneeling x12 reps, initially  demonstrating difficulty with transitional movement, which appeared to be due to decreased lateral weight shift to R.         Vestibular Treatment/Exercise - 01/21/16 0001    Vestibular Treatment/Exercise   Vestibular Treatment Provided Gaze   Gaze Exercises X1 Viewing Horizontal;X1 Viewing Vertical   X1 Viewing Horizontal   Foot Position standing   Reps 2   Comments x30 sec with glasses on then with glasses off. Cueing required to decrease speed of head movement due to pt report of "moving" visual target, which was mitigated with slower head movement   X1 Viewing Vertical   Foot Position standing   Reps 1   Comments x30 sec with glassess off            Balance Exercises - 01/21/16 1955    Balance Exercises: Standing   Balance Beam At // bars, performed forward/retro stepping onto/off of blue foam beam x10 reps with RLE with no significant difficulty; 2 x10 reps with LLE, initially with consistent LLE toe catch on beam (both directions), which may be attributable to limited weight shift to R side.   Step Over Hurdles / Cones Negotiated hurdles x9 of varying height forward-facing with LLE leading x2 trials, with LLE leading x2 trials; laterally with each LE leading x2 trials each; forward/lateral (B directions) negotiation with quarter turns between hurdles with noted pt difficulty clearing hurdles while recalling sequence of quarter turns.    Other Standing Exercises At // bars, performed static standing on BOSU with frequent LOB to L side initially noted; however, pt able to self-correct with use of mirror for visual feedback. Progressed to static standing on BOSU while performing slow horizontal, vertical head turns x10 reps each with min guard, use of mirror to maintain stability/balance. In hallway, performed LE braiding x80' with each LE leading with no overt LOB, decreased speed, coordination with RLE leading. Tipped over cones then replaced into upright position x10 reps with RLE,  x10 reps with LLE with decreased stability of dynamic R single limb stance.             PT Short Term Goals - 01/11/16 1340    PT SHORT TERM GOAL #1   Title The patient will demonstrate initiation of HEP with supervision and assistance from husband as needed to maximize functional gains made in PT.   Baseline TARGET DATE = 01/30/16   Time 4   Period Weeks   Status On-going   PT SHORT TERM GOAL #2   Title The patient will demonstrate ability to negotiate 12 steps with 1 handrail assistance mod I and reciprocal gait pattern in order to safely reach second floor of home for bathing.   Time 4   Period Weeks   Status On-going  PT SHORT TERM GOAL #3   Title The patient will ambulate 150' indoors over level surfaces at supervision level while performing horizontal head turns x2 each direction and vertical head turns x2 each direction to demonstrate improvements and safety with dynamic gait.   Time 4   Period Weeks   Status On-going   PT SHORT TERM GOAL #4   Title The patient will demonstrate ability to move from quadruped position to tall kneeling position independently 5x with no overt LOB to simulate gardening tasks at home.   Baseline MET = 01/11/16   Time 4   Period Weeks   Status Achieved   PT SHORT TERM GOAL #5   Title The patient will improve 10 m walk test to at least 2.62 ft/sec in order to demonstrate improvent in gait speed and become safe community ambulator.   Baseline 2.54 ft/sec   Time 8   Period Weeks   Status On-going           PT Long Term Goals - 01/02/16 1536    PT LONG TERM GOAL #1   Title The patient will demonstrate independence and safety with HEP in order to maximize functional gains made in PT.   Baseline TARGET DATE = 03/02/16   Time 8   Period Weeks   Status New   PT LONG TERM GOAL #2   Title The patient will return to gardening at home including kneeling, bending over, and sitting on stool in order to return to leisure activities safely and  independently.   Time 8   Period Weeks   Status New   PT LONG TERM GOAL #3   Title The patient will improve DGI score to at least 19 in order to demonstrate improvements and safety with dynamic gait.   Baseline DGI = 14   Time 8   Period Weeks   Status New   PT LONG TERM GOAL #4   Title The patient will ambulate 150' outdoors over paved surfaces and grass independently in order to demonstrate independence with community ambulation and safety going outdoors to her garden.   Baseline The patient is currently unable to walk outdoors to her garden without assistance   Time 8   Period Weeks   Status New   PT LONG TERM GOAL #5   Title The patient will improve Neuro QOL LE by at least 5 points to demonstrate improved QOL and LE function.   Baseline initial = 48.9   Time 8   Period Weeks               Plan - 01/21/16 2008    Clinical Impression Statement Session focused on increasing stability with high level balance and gait activities.  Noted decreased LLE clearance in multiple contexts, which may be attributable to decreased LLE coordination vs. limited weight shift to R. Pt believes this is attributable to leg length discrepancy (length of R > LLE). Will continue to assess.   Pt will benefit from skilled therapeutic intervention in order to improve on the following deficits Abnormal gait;Decreased balance;Decreased mobility;Decreased strength;Difficulty walking;Impaired vision/preception   Rehab Potential Good   Clinical Impairments Affecting Rehab Potential good support system - husband   PT Frequency 2x / week   PT Duration 8 weeks   PT Treatment/Interventions ADLs/Self Care Home Management;Biofeedback;Canalith Repostioning;Electrical Stimulation;Moist Heat;Cryotherapy;DME Instruction;Gait training;Stair training;Functional mobility training;Therapeutic activities;Therapeutic exercise;Balance training;Neuromuscular re-education;Patient/family education;Orthotic Fit/Training;Manual  techniques;Passive range of motion;Vestibular;Visual/perceptual remediation/compensation   PT Next Visit Plan Continue to progress gaze  stabilization and corner balance HEP prn.  High level balance - challenge vestibular system, rockerboard eyes closed perturbations, rockerboard/BOSU dynamic activities i.e. ball toss, step over. Gaze stability with gait.   PT Home Exercise Plan VOR x1 horizontal/vertical head turns, feet together wide base on compliant surface, wall bumps   Consulted and Agree with Plan of Care Patient;Family member/caregiver   Family Member Consulted Husband - Ed        Problem List Patient Active Problem List   Diagnosis Date Noted  . ICH (intracerebral hemorrhage) ([place]) - R occipital 12/27/2015  . Atrial fibrillation with RVR ([place]) 12/27/2015  . Hyperlipidemia 12/27/2015  . Cognitive deficits 12/27/2015  . Hypothyroidism 12/27/2015  . Anxiety 12/27/2015  . Subarachnoid hemorrhage ([place]) - L frontal 12/25/2015  . Cerebellar stroke ([place]) 12/02/2014  . Osteopenia 12/01/2014  . Rhinitis, chronic 01/13/2014  . Breast cancer of upper-outer quadrant of right female breast ([place]) 09/26/2013    Billie Ruddy, PT, [facility] [address] [place], [place], [address] Phone: [phone]   Fax:  [phone] 01/21/2016, 8:35 PM   Name: MILDA LINDVALL MRN: [id number] Date of Birth: June 26, 1951

## 2016-01-21 NOTE — Therapy (Signed)
[facility] [address] [place], [place], [address] Phone: [phone]   Fax:  [phone]  Speech Language Pathology Treatment  Patient Details  Name: Barbara [name] MRN: [id number] Date of Birth: 08-May-1951 No Data Recorded  Encounter Date: 01/21/2016      End of Session - 01/21/16 1643    Visit Number 5   Number of Visits 17   Date for SLP Re-Evaluation 03/03/16   SLP Start Time 1533   SLP Stop Time  1615   SLP Time Calculation (min) 42 min   Activity Tolerance Patient tolerated treatment well      Past Medical History  Diagnosis Date  . Breast cancer ([place])   . Hashimoto's thyroiditis     with goiter  . Hypercholesteremia   . 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  Narrowing of the 4th and 5th lumbar interspaces.   Marland Kitchen Spina bifida ([place])     occutta L5  . Anxiety   . GERD (gastroesophageal reflux disease)   . Headache(784.0)     hx migraines  . Varicose vein     Past Surgical History  Procedure Laterality Date  . Hernia repair  1976    Left groin  . Tubal ligation    . Cystoscopy with urethral caruncle    . Mastectomy w/ sentinel node biopsy Bilateral 10/13/2013    Procedure:  BILATERAL MASTECTOMY WITH RIGHT SENTINEL LYMPH NODE BIOPSY;  Surgeon: Merrie Roof, MD;  Location: [place];  Service: General;  Laterality: Bilateral;  . Portacath placement Left 10/13/2013    Procedure: INSERTION PORT-A-CATH;  Surgeon: Merrie Roof, MD;  Location: [facility];  Service: General;  Laterality: Left;  . Breast reconstruction with placement of tissue expander and flex hd (acellular hydrated dermis) Bilateral 10/13/2013    Procedure: PLACEMENT OF BILATERAL TISSUE EXPANDER FOR BREAST RECONSTRUCTION ;  Surgeon: Crissie Reese, MD;  Location: [place];  Service: Plastics;  Laterality: Bilateral;  . Wisdom tooth extraction  1990  . Port-a-cath removal Left 12/21/2014   Procedure: MINOR REMOVAL OF PORT-A-CATH;  Surgeon: Autumn Messing III, MD;  Location: [facility];  Service: General;  Laterality: Left;    There were no vitals filed for this visit.  Visit Diagnosis: Aphasia      Subjective Assessment - 01/21/16 1535    Subjective Pt completed all the homework, did more than x2/day (15-20 minutes per "session")   Patient is accompained by: --  husband   Currently in Pain? No/denies               ADULT SLP TREATMENT - 01/21/16 1618    General Information   Behavior/Cognition Alert;Cooperative;Pleasant mood   Cognitive-Linquistic Treatment   Treatment focused on Aphasia   Skilled Treatment SLP facilitated pt's expressive language today by working with pt on mod complex synonynms. Pt req'd mod--max A usually for >1 synonym.    Assessment / Recommendations / Plan   Plan Continue with current plan of care   Progression Toward Goals   Progression toward goals Progressing toward goals            SLP Short Term Goals - 01/21/16 1644    SLP SHORT TERM GOAL #1   Title pt will tell SLP 3 compensations for anomia/dysnomia   Status Achieved   SLP SHORT TERM GOAL #2   Title pt will demo emergent awareness of aphasic  errors 90% of the time   Time 2   Period Weeks   Status On-going   SLP SHORT TERM GOAL #3   Title pt will demo understanding of 10 minute simple to mod-complex conversation with pt requests for repeats allowed   Status Achieved          SLP Long Term Goals - 01/21/16 1644    SLP LONG TERM GOAL #1   Title pt will demo understanding of 10 minutes mod complex/complex conversation with pt requests for repeats allowed   Time 6   Period Weeks   Status On-going   SLP LONG TERM GOAL #2   Title pt will demo compensations to generate 10 minutes WFL mod complex/complex conversation   Time 6   Period Weeks   Status On-going          Plan - 01/21/16 1643    Clinical Impression Statement Pt presents with min-mod  expressive aphasia and would cont to benefit from skilled ST to maximize receptive and expressive aphasia. Likely some cognitive deficits as well however SLP to focus on aphasia. Pt/family agree with this plan.   Speech Therapy Frequency 2x / week   Duration --  6 weeks   Treatment/Interventions SLP instruction and feedback;Compensatory strategies;Internal/external aids;Patient/family education;Functional tasks;Language facilitation;Cueing hierarchy   Potential Considerations --  time post onset   Consulted and Agree with Plan of Care Patient;Family member/caregiver   Family Member Consulted husband        Problem List Patient Active Problem List   Diagnosis Date Noted  . ICH (intracerebral hemorrhage) ([place]) - R occipital 12/27/2015  . Atrial fibrillation with RVR ([place]) 12/27/2015  . Hyperlipidemia 12/27/2015  . Cognitive deficits 12/27/2015  . Hypothyroidism 12/27/2015  . Anxiety 12/27/2015  . Subarachnoid hemorrhage ([place]) - L frontal 12/25/2015  . Cerebellar stroke ([place]) 12/02/2014  . Osteopenia 12/01/2014  . Rhinitis, chronic 01/13/2014  . Breast cancer of upper-outer quadrant of right female breast ([place]) 09/26/2013    [facility], [place]  01/21/2016, 4:45 PM  [facility] [address] [place], [place], [address] Phone: [phone]   Fax:  [phone]   Name: Barbara [name] MRN: [id number] Date of Birth: 07-Mar-1951

## 2016-01-21 NOTE — Patient Instructions (Signed)
  Please complete the assigned speech therapy homework and return it to your next session.  

## 2016-01-21 NOTE — Therapy (Signed)
[facility] [address] [place], [place], [address] Phone: [phone]   Fax:  [phone]  Occupational Therapy Treatment  Patient Details  Name: Barbara [name] MRN: [id number] Date of Birth: Mar 30, 1951 Referring Provider: Dr. Rosalin Hawking  Encounter Date: 01/21/2016      OT End of Session - 01/21/16 1713    Visit Number 5   Number of Visits 17   Date for OT Re-Evaluation 03/02/16   Authorization Type [employer], G-code needed   Authorization - Visit Number 5   Authorization - Number of Visits 10   OT Start Time [id number]   OT Stop Time 1530   OT Time Calculation (min) 42 min   Activity Tolerance Patient tolerated treatment well   Behavior During Therapy [facility] for tasks assessed/performed      Past Medical History  Diagnosis Date  . Breast cancer ([place])   . Hashimoto's thyroiditis     with goiter  . Hypercholesteremia   . 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  Narrowing of the 4th and 5th lumbar interspaces.   Marland Kitchen Spina bifida ([place])     occutta L5  . Anxiety   . GERD (gastroesophageal reflux disease)   . Headache(784.0)     hx migraines  . Varicose vein     Past Surgical History  Procedure Laterality Date  . Hernia repair  1976    Left groin  . Tubal ligation    . Cystoscopy with urethral caruncle    . Mastectomy w/ sentinel node biopsy Bilateral 10/13/2013    Procedure:  BILATERAL MASTECTOMY WITH RIGHT SENTINEL LYMPH NODE BIOPSY;  Surgeon: Merrie Roof, MD;  Location: [place] [facility];  Service: General;  Laterality: Bilateral;  . Portacath placement Left 10/13/2013    Procedure: INSERTION PORT-A-CATH;  Surgeon: Merrie Roof, MD;  Location: [facility];  Service: General;  Laterality: Left;  . Breast reconstruction with placement of tissue expander and flex hd (acellular hydrated dermis) Bilateral 10/13/2013    Procedure: PLACEMENT OF BILATERAL TISSUE  EXPANDER FOR BREAST RECONSTRUCTION ;  Surgeon: Crissie Reese, MD;  Location: [facility];  Service: Plastics;  Laterality: Bilateral;  . Wisdom tooth extraction  1990  . Port-a-cath removal Left 12/21/2014    Procedure: MINOR REMOVAL OF PORT-A-CATH;  Surgeon: Autumn Messing III, MD;  Location: [facility];  Service: General;  Laterality: Left;    There were no vitals filed for this visit.  Visit Diagnosis:  Cognitive deficits      Subjective Assessment - 01/21/16 1712    Subjective  Pt/husband reports cooking at home without difficulty   Pertinent History hx of breast CA (2014) with chemo for 1 year and bilateral mastectomy; hx of word finding and concentration difficulties which improved after completing chemo; a-fib, hx of small punctate infarct L erebellum vs. demyelinating disease   Limitations receptive aphasia   Patient Stated Goals improve balance, be able to garden   Currently in Pain? No/denies          Treatment:  Cooking 2 items at the same time (mac and cheese and brownies).  Pt demo good planning and following of directions, appropriately set timers and anticipated needed items.  Pt demo good safety including use of oven mitts, except left cabinet door open x2 (bumped her head on 1).  Pt cautioned against this.  OT Short Term Goals - 01/16/16 0854    OT SHORT TERM GOAL #1   Title Pt will be independent with HEP for L hand coordination and strength.--check STGs 01/31/16   Status On-going   OT SHORT TERM GOAL #2   Title Pt will perform simple home maintenance tasks with no LOB.   Status Achieved  01/16/16  gardening per pt report   OT SHORT TERM GOAL #3   Title Pt will perform simple cooking task with written directions mod I.   Status Achieved   OT SHORT TERM GOAL #4   Title Pt will perform simple financial management tasks without cueing with at least 95% accuracy.   Status Achieved  01/16/16           OT Long Term Goals  - 01/21/16 1714    OT LONG TERM GOAL #1   Title Pt will perform mod complex home maintenance tasks mod I.--check LTGs 03/02/16   Time 8   Period Weeks   Status Achieved  01/16/16  has returned to gardening without LOB/difficulty (taking rest breaks after 28min)   OT LONG TERM GOAL #2   Title Pt will perform simple-mod complex financial management tasks with at least 95% accuracy.   Time 8   Period Weeks   Status New   OT LONG TERM GOAL #3   Title Pt will be able to cook 2 items simultaneously safely and demonstrating good planning/divided attention.   Time 8   Period Weeks   Status Achieved  01/21/16   OT LONG TERM GOAL #4   Title Pt will improve L grip strength by at least 5lbs to assist in opening containers/lifting objects.   Baseline R-60lbs, L-47lbs   Time 8   Period Weeks   Status Achieved  01/16/16:  60lbs   OT LONG TERM GOAL #5   Title Pt will improve coordination for ADLs as shown by improving time on 9-hole peg test by at least 5sec with L hand.   Baseline R-17.84sec, L-25.42sec   Time 8   Period Weeks   Status New               Plan - 01/21/16 1713    Clinical Impression Statement Pt is progressing towards goals.   Plan continue toward unmet goals   OT Home Exercise Plan issued coordination/ red putty 01/10/16   Consulted and Agree with Plan of Care Patient;Family member/caregiver   Family Member Consulted husband        Problem List Patient Active Problem List   Diagnosis Date Noted  . ICH (intracerebral hemorrhage) ([place]) - R occipital 12/27/2015  . Atrial fibrillation with RVR ([place]) 12/27/2015  . Hyperlipidemia 12/27/2015  . Cognitive deficits 12/27/2015  . Hypothyroidism 12/27/2015  . Anxiety 12/27/2015  . Subarachnoid hemorrhage ([place]) - L frontal 12/25/2015  . Cerebellar stroke ([place]) 12/02/2014  . Osteopenia 12/01/2014  . Rhinitis, chronic 01/13/2014  . Breast cancer of upper-outer quadrant of right female breast [facility]) 09/26/2013     [facility] 01/21/2016, 5:14 PM  [facility] [address] [place], [place], [address] Phone: [phone]   Fax:  [phone]  Name: Barbara [name] MRN: [id number] Date of Birth: [birth year]  Vianne Bulls, OTR/L [facility] [address] [place], [place]  [address] [phone] phone [phone] 01/21/2016 5:14 PM

## 2016-01-22 ENCOUNTER — Ambulatory Visit: Payer: PPO | Admitting: Diagnostic Neuroimaging

## 2016-01-23 ENCOUNTER — Ambulatory Visit: Payer: PPO | Attending: Neurology | Admitting: Rehabilitative and Restorative Service Providers"

## 2016-01-23 ENCOUNTER — Ambulatory Visit: Payer: PPO

## 2016-01-23 ENCOUNTER — Ambulatory Visit: Payer: PPO | Admitting: Occupational Therapy

## 2016-01-23 DIAGNOSIS — R279 Unspecified lack of coordination: Secondary | ICD-10-CM | POA: Diagnosis not present

## 2016-01-23 DIAGNOSIS — R4701 Aphasia: Secondary | ICD-10-CM | POA: Insufficient documentation

## 2016-01-23 DIAGNOSIS — M6289 Other specified disorders of muscle: Secondary | ICD-10-CM | POA: Diagnosis not present

## 2016-01-23 DIAGNOSIS — R278 Other lack of coordination: Secondary | ICD-10-CM

## 2016-01-23 DIAGNOSIS — R4189 Other symptoms and signs involving cognitive functions and awareness: Secondary | ICD-10-CM | POA: Diagnosis not present

## 2016-01-23 DIAGNOSIS — R29898 Other symptoms and signs involving the musculoskeletal system: Secondary | ICD-10-CM

## 2016-01-23 DIAGNOSIS — R269 Unspecified abnormalities of gait and mobility: Secondary | ICD-10-CM | POA: Diagnosis not present

## 2016-01-23 DIAGNOSIS — R2681 Unsteadiness on feet: Secondary | ICD-10-CM | POA: Diagnosis not present

## 2016-01-23 NOTE — Patient Instructions (Signed)
  Strengthening: Resisted Flexion   Hold tubing with ___one__ arm(s) at side. Pull forward and up. Move shoulder through pain-free range of motion. Repeat __10__ times per set.  Do _1-2_ sessions per day , every other day   Strengthening: Resisted Extension   Hold tubing in __one___ hand(s), arm forward. Pull arm back, elbow straight. Repeat _10___ times per set. Do _1-2___ sessions per day, every other day.   Resisted Horizontal Abduction: Bilateral   Sit or stand, tubing in both hands, arms out in front. Keeping arms straight, pinch shoulder blades together and stretch arms out. Repeat _10___ times per set. Do _1-2___ sessions per day, every other day.   Elbow Flexion: Resisted   With tubing held in __one____ hand(s) and other end secured under foot, curl arm up as far as possible. Repeat _10___ times per set. Do _1-2___ sessions per day, every other day.    Elbow Extension: Resisted   Sit in chair with resistive band held with other hand and _one______ elbow bent. Straighten elbow. Repeat _10___ times per set.  Do _1-2___ sessions per day, every other day.   Copyright  VHI. All rights reserved.

## 2016-01-23 NOTE — Therapy (Signed)
[facility] [address] [place], [place], [address] Phone: [phone]   Fax:  [phone]  Physical Therapy Treatment  Patient Details  Name: Barbara [name] MRN: [id number] Date of Birth: August 13, 1951 Referring Provider: Rosalin Hawking  Encounter Date: 01/23/2016      PT End of Session - 01/23/16 1232    Visit Number 7   Number of Visits 17   Date for PT Re-Evaluation 03/02/16   Authorization Type [employer]/Medicare   Authorization Time Period G codes every 10th visit   PT Start Time 41   PT Stop Time 1142  Pt and husband wished to leave session to make it to [place] at church   PT Time Calculation (min) 40 min   Equipment Utilized During Treatment Gait belt   Activity Tolerance Patient tolerated treatment well   Behavior During Therapy [facility] for tasks assessed/performed      Past Medical History  Diagnosis Date  . Breast cancer ([place])   . Hashimoto's thyroiditis     with goiter  . Hypercholesteremia   . 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  Narrowing of the 4th and 5th lumbar interspaces.   Marland Kitchen Spina bifida ([place])     occutta L5  . Anxiety   . GERD (gastroesophageal reflux disease)   . Headache(784.0)     hx migraines  . Varicose vein     Past Surgical History  Procedure Laterality Date  . Hernia repair  1976    Left groin  . Tubal ligation    . Cystoscopy with urethral caruncle    . Mastectomy w/ sentinel node biopsy Bilateral 10/13/2013    Procedure:  BILATERAL MASTECTOMY WITH RIGHT SENTINEL LYMPH NODE BIOPSY;  Surgeon: Merrie Roof, MD;  Location: [place];  Service: General;  Laterality: Bilateral;  . Portacath placement Left 10/13/2013    Procedure: INSERTION PORT-A-CATH;  Surgeon: Merrie Roof, MD;  Location: [place];  Service: General;  Laterality: Left;  . Breast reconstruction with placement of tissue expander and flex hd (acellular  hydrated dermis) Bilateral 10/13/2013    Procedure: PLACEMENT OF BILATERAL TISSUE EXPANDER FOR BREAST RECONSTRUCTION ;  Surgeon: Crissie Reese, MD;  Location: [place];  Service: Plastics;  Laterality: Bilateral;  . Wisdom tooth extraction  1990  . Port-a-cath removal Left 12/21/2014    Procedure: MINOR REMOVAL OF PORT-A-CATH;  Surgeon: Autumn Messing III, MD;  Location: [place];  Service: General;  Laterality: Left;    There were no vitals filed for this visit.  Visit Diagnosis:  Abnormality of gait  Unsteadiness  Decreased coordination      Subjective Assessment - 01/23/16 1107    Subjective Pt arrives today with no significant changes since last session. Pt says standing on pillow at home has gotten easier.    Patient is accompained by: Family member   Patient Stated Goals I want to get my gait back, and carry things, bend over, and get down so I can work in my garden again.   Currently in Pain? No/denies           [facility] Adult PT Treatment/Exercise - 01/23/16 1222    Neuro Re-ed    Neuro Re-ed Details  Static standing on BOSU ball dome side up x1 min, 2x30 sec with eyes closed. Static marching on foam Airex 2x30 sec eyes open, 2x30 sec eyes closed. Pt unable to march  in place and requires verbal cues from PT to cease activity so she does not march forward off of Airex. Posterior steps with ant trunk lean x10 bilaterally on blue foam mat with verbal instructions to look down towards mat and up towards wall in order to present greater challenge to pt vestibular system with dec reliance on visual fixation. Lateral steps on blue foam mat x5 eyes open, x10 eyes closed, x4 with verbal cue to stop and hold position to challenge reactive balance on compliant surface. Lateral stepping centered on foam blue mat to stepping one foot off side of mat onto floor with eyes closed x10 bilaterally as to challenge vestibular system with changing input from somatosensory system. Ambulated 115'  with verbal cues and instructions for pt to turn L, R, and stop as to challenge reactive and immediate postural control with head/body turns.  Performed tall kneeling on BOSU with foam Airex underneath feet while performing reaching for cone and placement L/R med/lat direction on floor x10, x10 on mat table in front of pt. Standing VOR x1 vertical/horizontal head turns 2x30 sec each with verbal cues to inc amplitude and speed of movement. Static standing on foam eyes closed feet together 2x30 sec to isolate vestibular system for HEP.           PT Education - 01/23/16 1232    Education provided Yes   Education Details HEP progressed to static standing on foam eyes closed feet together. Pt advised to increase amplitude and speed of movement to tolerance with VOR exercises.   Person(s) Educated Patient;Spouse   Methods Explanation;Demonstration;Tactile cues;Verbal cues   Comprehension Verbalized understanding;Returned demonstration;Verbal cues required;Tactile cues required          PT Short Term Goals - 01/11/16 1340    PT SHORT TERM GOAL #1   Title The patient will demonstrate initiation of HEP with supervision and assistance from husband as needed to maximize functional gains made in PT.   Baseline TARGET DATE = 01/30/16   Time 4   Period Weeks   Status On-going   PT SHORT TERM GOAL #2   Title The patient will demonstrate ability to negotiate 12 steps with 1 handrail assistance mod I and reciprocal gait pattern in order to safely reach second floor of home for bathing.   Time 4   Period Weeks   Status On-going   PT SHORT TERM GOAL #3   Title The patient will ambulate 150' indoors over level surfaces at supervision level while performing horizontal head turns x2 each direction and vertical head turns x2 each direction to demonstrate improvements and safety with dynamic gait.   Time 4   Period Weeks   Status On-going   PT SHORT TERM GOAL #4   Title The patient will demonstrate ability  to move from quadruped position to tall kneeling position independently 5x with no overt LOB to simulate gardening tasks at home.   Baseline MET = 01/11/16   Time 4   Period Weeks   Status Achieved   PT SHORT TERM GOAL #5   Title The patient will improve 10 m walk test to at least 2.62 ft/sec in order to demonstrate improvent in gait speed and become safe community ambulator.   Baseline 2.54 ft/sec   Time 8   Period Weeks   Status On-going           PT Long Term Goals - 01/02/16 1536    PT LONG TERM GOAL #1   Title The  patient will demonstrate independence and safety with HEP in order to maximize functional gains made in PT.   Baseline TARGET DATE = 03/02/16   Time 8   Period Weeks   Status New   PT LONG TERM GOAL #2   Title The patient will return to gardening at home including kneeling, bending over, and sitting on stool in order to return to leisure activities safely and independently.   Time 8   Period Weeks   Status New   PT LONG TERM GOAL #3   Title The patient will improve DGI score to at least 19 in order to demonstrate improvements and safety with dynamic gait.   Baseline DGI = 14   Time 8   Period Weeks   Status New   PT LONG TERM GOAL #4   Title The patient will ambulate 150' outdoors over paved surfaces and grass independently in order to demonstrate independence with community ambulation and safety going outdoors to her garden.   Baseline The patient is currently unable to walk outdoors to her garden without assistance   Time 8   Period Weeks   Status New   PT LONG TERM GOAL #5   Title The patient will improve Neuro QOL LE by at least 5 points to demonstrate improved QOL and LE function.   Baseline initial = 48.9   Time 8   Period Weeks           Plan - 01/23/16 1233    Clinical Impression Statement Pt continues to demonstrate improvements in static and dynamic balance. Pt continues to have difficulties when vestibular system isolated as with standing on  foam eyes closed and with dynamic movements when visual fixation is removed, though this is improving. HEP progressed for further challenge of vestibular system. Continue per POC.   Pt will benefit from skilled therapeutic intervention in order to improve on the following deficits Abnormal gait;Decreased balance;Decreased mobility;Decreased strength;Difficulty walking;Impaired vision/preception   Rehab Potential Good   Clinical Impairments Affecting Rehab Potential good support system - husband   PT Frequency 2x / week   PT Duration 8 weeks   PT Treatment/Interventions ADLs/Self Care Home Management;Biofeedback;Canalith Repostioning;Electrical Stimulation;Moist Heat;Cryotherapy;DME Instruction;Gait training;Stair training;Functional mobility training;Therapeutic activities;Therapeutic exercise;Balance training;Neuromuscular re-education;Patient/family education;Orthotic Fit/Training;Manual techniques;Passive range of motion;Vestibular;Visual/perceptual remediation/compensation   PT Next Visit Plan Challenge vestibular system, VOR/gaze exercises   PT Home Exercise Plan static standing on foam eyes closed narrow base   Consulted and Agree with Plan of Care Patient;Family member/caregiver   Family Member Consulted Husband - Ed       Problem List Patient Active Problem List   Diagnosis Date Noted  . ICH (intracerebral hemorrhage) ([place]) - R occipital 12/27/2015  . Atrial fibrillation with RVR ([place]) 12/27/2015  . Hyperlipidemia 12/27/2015  . Cognitive deficits 12/27/2015  . Hypothyroidism 12/27/2015  . Anxiety 12/27/2015  . Subarachnoid hemorrhage ([place]) - L frontal 12/25/2015  . Cerebellar stroke ([place]) 12/02/2014  . Osteopenia 12/01/2014  . Rhinitis, chronic 01/13/2014  . Breast cancer of upper-outer quadrant of right female breast ([place]) 09/26/2013    De Nurse, SPT 01/23/2016, 12:38 PM  [facility] [address] [place], [place], [address] Phone: [phone]   Fax:  [phone]  Name: KYMARI LOLLIS MRN: [id number] Date of Birth: 11/15/1951

## 2016-01-23 NOTE — Therapy (Signed)
[facility] [address] [place], [place], [address] Phone: [phone]   Fax:  [phone]  Occupational Therapy Treatment  Patient Details  Name: Barbara [name] MRN: [id number] Date of Birth: 1951-05-28 Referring Provider: Dr. Rosalin Hawking  Encounter Date: 01/23/2016      OT End of Session - 01/23/16 1144    Visit Number 6   Number of Visits 17   Date for OT Re-Evaluation 03/02/16   Authorization Type [employer], G-code needed   Authorization - Visit Number 6   Authorization - Number of Visits 10   OT Start Time [phone]   OT Stop Time 1027   OT Time Calculation (min) 51 min   Activity Tolerance Patient tolerated treatment well   Behavior During Therapy [facility] for tasks assessed/performed      Past Medical History  Diagnosis Date  . Breast cancer ([place])   . Hashimoto's thyroiditis     with goiter  . Hypercholesteremia   . 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  Narrowing of the 4th and 5th lumbar interspaces.   Marland Kitchen Spina bifida ([place])     occut[place] L5  . Anxiety   . GERD (gastroesophageal reflux disease)   . Headache(784.0)     hx migraines  . Varicose vein     Past Surgical History  Procedure Laterality Date  . Hernia repair  1976    Left groin  . Tubal ligation    . Cystoscopy with urethral caruncle    . Mastectomy w/ sentinel node biopsy Bilateral 10/13/2013    Procedure:  BILATERAL MASTECTOMY WITH RIGHT SENTINEL LYMPH NODE BIOPSY;  Surgeon: Merrie Roof, MD;  Location: [place];  Service: General;  Laterality: Bilateral;  . Portacath placement Left 10/13/2013    Procedure: INSERTION PORT-A-CATH;  Surgeon: Merrie Roof, MD;  Location: [place];  Service: General;  Laterality: Left;  . Breast reconstruction with placement of tissue expander and flex hd (acellular hydrated dermis) Bilateral 10/13/2013    Procedure: PLACEMENT OF BILATERAL TISSUE  EXPANDER FOR BREAST RECONSTRUCTION ;  Surgeon: Crissie Reese, MD;  Location: [place];  Service: Plastics;  Laterality: Bilateral;  . Wisdom tooth extraction  1990  . Port-a-cath removal Left 12/21/2014    Procedure: MINOR REMOVAL OF PORT-A-CATH;  Surgeon: Autumn Messing III, MD;  Location: [place];  Service: General;  Laterality: Left;    There were no vitals filed for this visit.  Visit Diagnosis:  Cognitive deficits  Decreased coordination  Weakness of left hand      Subjective Assessment - 01/23/16 1139    Pertinent History hx of breast CA (2014) with chemo for 1 year and bilateral mastectomy; hx of word finding and concentration difficulties which improved after completing chemo; a-fib, hx of small punctate infarct L erebellum vs. demyelinating disease   Limitations receptive aphasia   Patient Stated Goals improve balance, be able to garden   Currently in Pain? No/denies         Treatment: moderate word problems with only min v.c. for organization, Pt made 1 error when completing long hand hand,. Pt was able to self correct using calculator and pt reports using a calculator at home. Pt was instructed in a red theraband HEP for LUE strenghthening and overall conditioning, pt returned demonstration following min v.c. and demonstration, 15 reps each. See pt instructions  OT Short Term Goals - 01/16/16 0854    OT SHORT TERM GOAL #1   Title Pt will be independent with HEP for L hand coordination and strength.--check STGs 01/31/16   Status On-going   OT SHORT TERM GOAL #2   Title Pt will perform simple home maintenance tasks with no LOB.   Status Achieved  01/16/16  gardening per pt report   OT SHORT TERM GOAL #3   Title Pt will perform simple cooking task with written directions mod I.   Status Achieved   OT SHORT TERM GOAL #4   Title Pt will perform simple financial management tasks without cueing with at least 95% accuracy.    Status Achieved  01/16/16           OT Long Term Goals - 01/23/16 1142    OT LONG TERM GOAL #1   Title Pt will perform mod complex home maintenance tasks mod I.--check LTGs 03/02/16   Time 8   Period Weeks   Status Achieved  01/16/16  has returned to gardening without LOB/difficulty (taking rest breaks after 6min)   OT LONG TERM GOAL #2   Title Pt will perform simple-mod complex financial management tasks with at least 95% accuracy.   Time 8   Period Weeks   Status New   OT LONG TERM GOAL #3   Title Pt will be able to cook 2 items simultaneously safely and demonstrating good planning/divided attention.   Time 8   Period Weeks   Status Achieved  01/21/16   OT LONG TERM GOAL #4   Title Pt will improve L grip strength by at least 5lbs to assist in opening containers/lifting objects.   Baseline R-60lbs, L-47lbs   Time 8   Period Weeks   Status Achieved  01/16/16:  60lbs   OT LONG TERM GOAL #5   Title Pt will improve coordination for ADLs as shown by improving time on 9-hole peg test by at least 5sec with L hand.   Baseline R-17.84sec, L-25.42sec   Time 8   Period Weeks   Status New               Plan - 01/23/16 1140    Clinical Impression Statement Pt is progressing towards goals. Anticipate d/c in the next 1-2 visits, pt and husband are in agreement.   Pt will benefit from skilled therapeutic intervention in order to improve on the following deficits (Retired) Decreased cognition;Decreased strength;Decreased mobility;Decreased balance;Decreased knowledge of use of DME;Impaired UE functional use;Decreased coordination   Rehab Potential Good   OT Frequency 2x / week   OT Duration 8 weeks   OT Treatment/Interventions Self-care/ADL training;Therapeutic exercise;Functional Mobility Training;Patient/family education;Neuromuscular education;Manual Therapy;Therapeutic exercises;Splinting;Energy conservation;Cryotherapy;DME and/or AE instruction;Therapeutic activities;Cognitive  remediation/compensation;Moist Heat;Electrical Stimulation;Fluidtherapy;Passive range of motion   Plan  check goals, possible d/c next 1-2 visits   OT Home Exercise Plan issued coordination/ red putty 01/10/16, red theraband for conditioning 01/24/16   Consulted and Agree with Plan of Care Patient;Family member/caregiver   Family Member Consulted husband        Problem List Patient Active Problem List   Diagnosis Date Noted  . ICH (intracerebral hemorrhage) ([place]) - R occipital 12/27/2015  . Atrial fibrillation with RVR ([place]) 12/27/2015  . Hyperlipidemia 12/27/2015  . Cognitive deficits 12/27/2015  . Hypothyroidism 12/27/2015  . Anxiety 12/27/2015  . Subarachnoid hemorrhage ([place]) - L frontal 12/25/2015  . Cerebellar stroke ([place]) 12/02/2014  . Osteopenia 12/01/2014  . Rhinitis, chronic 01/13/2014  . Breast  cancer of upper-outer quadrant of right female breast ([place]) 09/26/2013    RINE,KATHRYN 01/23/2016, 11:45 AM Theone Murdoch, OTR/L [phone] Phone: [phone] 11:45 AM 01/23/2016 [facility] [address] [place], [place], [address] Phone: [phone]   Fax:  [phone]  Name: Barbara [name] MRN: [id number] Date of Birth: Jul 23, 1951

## 2016-01-23 NOTE — Therapy (Signed)
[facility] [address] [place], [place], [address] Phone: [phone]   Fax:  [phone]  Speech Language Pathology Treatment  Patient Details  Name: Barbara [name] MRN: [id number] Date of Birth: [birth year] No Data Recorded  Encounter Date: 01/23/2016      End of Session - 01/23/16 1147    Visit Number 6   Number of Visits 17   Date for SLP Re-Evaluation 03/03/16   SLP Start Time [id number]   SLP Stop Time  58   SLP Time Calculation (min) 39 min   Activity Tolerance Patient tolerated treatment well      Past Medical History  Diagnosis Date  . Breast cancer ([place])   . Hashimoto's thyroiditis     with goiter  . Hypercholesteremia   . 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  Narrowing of the 4th and 5th lumbar interspaces.   Marland Kitchen Spina bifida ([place])     occutta L5  . Anxiety   . GERD (gastroesophageal reflux disease)   . Headache(784.0)     hx migraines  . Varicose vein     Past Surgical History  Procedure Laterality Date  . Hernia repair  1976    Left groin  . Tubal ligation    . Cystoscopy with urethral caruncle    . Mastectomy w/ sentinel node biopsy Bilateral 10/13/2013    Procedure:  BILATERAL MASTECTOMY WITH RIGHT SENTINEL LYMPH NODE BIOPSY;  Surgeon: Merrie Roof, MD;  Location: [facility];  Service: General;  Laterality: Bilateral;  . Portacath placement Left 10/13/2013    Procedure: INSERTION PORT-A-CATH;  Surgeon: Merrie Roof, MD;  Location: [place];  Service: General;  Laterality: Left;  . Breast reconstruction with placement of tissue expander and flex hd (acellular hydrated dermis) Bilateral 10/13/2013    Procedure: PLACEMENT OF BILATERAL TISSUE EXPANDER FOR BREAST RECONSTRUCTION ;  Surgeon: Crissie Reese, MD;  Location: [facility];  Service: Plastics;  Laterality: Bilateral;  . Wisdom tooth extraction  1990  . Port-a-cath removal Left 12/21/2014   Procedure: MINOR REMOVAL OF PORT-A-CATH;  Surgeon: Autumn Messing III, MD;  Location: [facility];  Service: General;  Laterality: Left;    There were no vitals filed for this visit.  Visit Diagnosis: Aphasia      Subjective Assessment - 01/23/16 1040    Subjective Pt left OT at 1021. Pt arrives with homework.   Currently in Pain? No/denies               ADULT SLP TREATMENT - 01/23/16 1041    General Information   Behavior/Cognition Alert;Cooperative;Pleasant mood   Cognitive-Linquistic Treatment   Treatment focused on Aphasia   Skilled Treatment Limited emergent awareness with homework errors, unless pointed out by SLP. With iniital cues, pt's awareness improved significantly. To correct sentences for homework pt req'd min-mod cues from SLP for sematics. In detailed picture description task, pt exhibited more succinct and logical descriptions in order for husband to guess, as the task moved on. Pt used good to excellent descriptive wording with extra time.   Assessment / Recommendations / Plan   Plan Continue with current plan of care   Progression Toward Goals   Progression toward goals Progressing toward goals            SLP Short Term Goals - 01/21/16 1644    SLP SHORT TERM GOAL #1   Title pt  will tell SLP 3 compensations for anomia/dysnomia   Status Achieved   SLP SHORT TERM GOAL #2   Title pt will demo emergent awareness of aphasic errors 90% of the time   Time 2   Period Weeks   Status On-going   SLP SHORT TERM GOAL #3   Title pt will demo understanding of 10 minute simple to mod-complex conversation with pt requests for repeats allowed   Status Achieved          SLP Long Term Goals - 01/21/16 1644    SLP LONG TERM GOAL #1   Title pt will demo understanding of 10 minutes mod complex/complex conversation with pt requests for repeats allowed   Time 6   Period Weeks   Status On-going   SLP LONG TERM GOAL #2   Title pt will demo compensations  to generate 10 minutes WFL mod complex/complex conversation   Time 6   Period Weeks   Status On-going          Plan - 01/23/16 1147    Clinical Impression Statement Pt presents with min-mod expressive aphasia and would cont to benefit from skilled ST to maximize receptive and expressive aphasia. Likely some cognitive deficits as well however SLP to focus on aphasia. Pt/family agree with this plan.   Speech Therapy Frequency 2x / week   Duration --  6 weeks   Treatment/Interventions SLP instruction and feedback;Compensatory strategies;Internal/external aids;Patient/family education;Functional tasks;Language facilitation;Cueing hierarchy   Potential Considerations --  time post onset   Consulted and Agree with Plan of Care Patient;Family member/caregiver   Family Member Consulted husband        Problem List Patient Active Problem List   Diagnosis Date Noted  . ICH (intracerebral hemorrhage) ([place]) - R occipital 12/27/2015  . Atrial fibrillation with RVR ([place]) 12/27/2015  . Hyperlipidemia 12/27/2015  . Cognitive deficits 12/27/2015  . Hypothyroidism 12/27/2015  . Anxiety 12/27/2015  . Subarachnoid hemorrhage ([place]) - L frontal 12/25/2015  . Cerebellar stroke ([place]) 12/02/2014  . Osteopenia 12/01/2014  . Rhinitis, chronic 01/13/2014  . Breast cancer of upper-outer quadrant of right female breast ([place]) 09/26/2013    [facility] ,[place], [place]  01/23/2016, 11:48 AM  [facility] [address] [place], [place], [address] Phone: [phone]   Fax:  [phone]   Name: Barbara [name] MRN: [id number] Date of Birth: 04-21-1951

## 2016-01-28 ENCOUNTER — Ambulatory Visit: Payer: PPO | Admitting: Speech Pathology

## 2016-01-28 ENCOUNTER — Ambulatory Visit: Payer: PPO | Admitting: Occupational Therapy

## 2016-01-28 ENCOUNTER — Ambulatory Visit: Payer: PPO | Admitting: Rehabilitative and Restorative Service Providers"

## 2016-01-28 DIAGNOSIS — R4701 Aphasia: Secondary | ICD-10-CM

## 2016-01-28 DIAGNOSIS — R278 Other lack of coordination: Secondary | ICD-10-CM

## 2016-01-28 DIAGNOSIS — R279 Unspecified lack of coordination: Secondary | ICD-10-CM

## 2016-01-28 DIAGNOSIS — R2681 Unsteadiness on feet: Secondary | ICD-10-CM

## 2016-01-28 DIAGNOSIS — R269 Unspecified abnormalities of gait and mobility: Secondary | ICD-10-CM | POA: Diagnosis not present

## 2016-01-28 DIAGNOSIS — R4189 Other symptoms and signs involving cognitive functions and awareness: Secondary | ICD-10-CM

## 2016-01-28 NOTE — Therapy (Signed)
[facility] [address] [place], [place], [address] Phone: [phone]   Fax:  [phone]  Physical Therapy Treatment  Patient Details  Name: Barbara [name] MRN: [id number] Date of Birth: December 11, 1950 Referring Provider: Rosalin Hawking  Encounter Date: 01/28/2016      PT End of Session - 01/28/16 1412    Visit Number 8   Number of Visits 17   Date for PT Re-Evaluation 03/02/16   Authorization Type [employer] advantage/Medicare   Authorization Time Period G codes every 10th visit   PT Start Time 1103   PT Stop Time 1146   PT Time Calculation (min) 43 min   Activity Tolerance Patient tolerated treatment well   Behavior During Therapy [facility] for tasks assessed/performed      Past Medical History  Diagnosis Date  . Breast cancer ([place])   . Hashimoto's thyroiditis     with goiter  . Hypercholesteremia   . 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  Narrowing of the 4th and 5th lumbar interspaces.   Marland Kitchen Spina bifida ([place])     occutta L5  . Anxiety   . GERD (gastroesophageal reflux disease)   . Headache(784.0)     hx migraines  . Varicose vein     Past Surgical History  Procedure Laterality Date  . Hernia repair  1976    Left groin  . Tubal ligation    . Cystoscopy with urethral caruncle    . Mastectomy w/ sentinel node biopsy Bilateral 10/13/2013    Procedure:  BILATERAL MASTECTOMY WITH RIGHT SENTINEL LYMPH NODE BIOPSY;  Surgeon: Merrie Roof, MD;  Location: [facility];  Service: General;  Laterality: Bilateral;  . Portacath placement Left 10/13/2013    Procedure: INSERTION PORT-A-CATH;  Surgeon: Merrie Roof, MD;  Location: [facility];  Service: General;  Laterality: Left;  . Breast reconstruction with placement of tissue expander and flex hd (acellular hydrated dermis) Bilateral 10/13/2013    Procedure: PLACEMENT OF BILATERAL TISSUE EXPANDER FOR BREAST RECONSTRUCTION ;   Surgeon: Crissie Reese, MD;  Location: [facility];  Service: Plastics;  Laterality: Bilateral;  . Wisdom tooth extraction  1990  . Port-a-cath removal Left 12/21/2014    Procedure: MINOR REMOVAL OF PORT-A-CATH;  Surgeon: Autumn Messing III, MD;  Location: [facility];  Service: General;  Laterality: Left;    There were no vitals filed for this visit.  Visit Diagnosis:  Abnormality of gait  Decreased coordination  Unsteadiness      Subjective Assessment - 01/28/16 1104    Subjective The patient reports she is doing a lot of gardening and feels safe on compliant surfaces.  Eyes closed at home is improving.  "A" exercise is also improving. The patient drove this weekend with her husband in her car and did great per reports.   Patient is accompained by: Family member   Patient Stated Goals I want to get my gait back, and carry things, bend over, and get down so I can work in my garden again.   Currently in Pain? No/denies            [facility] PT Assessment - 01/28/16 1115    Standardized Balance Assessment   Standardized Balance Assessment Dynamic Gait Index   Dynamic Gait Index   Level Surface Normal   Change in Gait Speed Normal   Gait with Horizontal Head Turns Mild Impairment   Gait  with Vertical Head Turns Normal   Gait and Pivot Turn Normal   Step Over Obstacle Normal   Step Around Obstacles Normal   Steps Normal   Total Score 23   DGI comment: 23/24      Gait: DGI-23/24 Gait speed=2.71 ft/sec Dynamic gait activities walking on level surfaces with horizontal and vertical head turns, adding marching, direction changes as able  x 500 ft performing while dual tasking with cognitive demand.  NEUROMUSCULAR RE-EDUCATION: Updated HEP (see PT education for written ex) Gaze x 1 viewing with cues on continuing to increase speed and duration of activity.  SELF CARE/HOME MANAGEMENT: Discussed home limitations.  The patient feels current HEP is easy, therefore PT updated.   Patient is pleased with functional status.  PT discussed decreasing frequency to 1x/week and possibly still d/c'ing early as she is making improvements from challenges in her daily tasks and the patient is very active at home.      PT Education - 01/28/16 1411    Education provided Yes   Education Details HEP: updated to include partial heel/toe with eyes closed, partial heel/toe with head turns, tandem gait with pauses for control, gait with head turns, gaze x 1 viewing, compliant + feet together with eyes closed.   Person(s) Educated Patient;Spouse   Methods Explanation;Handout   Comprehension Verbalized understanding;Returned demonstration          PT Short Term Goals - 01/28/16 1107    PT SHORT TERM GOAL #1   Title The patient will demonstrate initiation of HEP with supervision and assistance from husband as needed to maximize functional gains made in PT.   Baseline TARGET DATE = 01/30/16   Time 4   Period Weeks   Status Achieved   PT SHORT TERM GOAL #2   Title The patient will demonstrate ability to negotiate 12 steps with 1 handrail assistance mod I and reciprocal gait pattern in order to safely reach second floor of home for bathing.   Baseline 4 steps x 4 reps without rails indep.   Time 4   Period Weeks   Status Achieved   PT SHORT TERM GOAL #3   Title The patient will ambulate 150' indoors over level surfaces at supervision level while performing horizontal head turns x2 each direction and vertical head turns x2 each direction to demonstrate improvements and safety with dynamic gait.   Baseline Pt does this modified indep with slowed pace to adapt to head turns and dynamic condition.   Time 4   Period Weeks   Status Achieved   PT SHORT TERM GOAL #4   Title The patient will demonstrate ability to move from quadruped position to tall kneeling position independently 5x with no overt LOB to simulate gardening tasks at home.   Baseline MET = 01/11/16   Time 4   Period Weeks    Status Achieved   PT SHORT TERM GOAL #5   Title The patient will improve 10 m walk test to at least 2.62 ft/sec in order to demonstrate improvent in gait speed and become safe community ambulator.   Baseline 2.71 ft/sec   Time 8   Period Weeks   Status Achieved           PT Long Term Goals - 01/28/16 1115    PT LONG TERM GOAL #1   Title The patient will demonstrate independence and safety with HEP in order to maximize functional gains made in PT.   Baseline TARGET DATE = 03/02/16  Time 8   Period Weeks   Status On-going   PT LONG TERM GOAL #2   Title The patient will return to gardening at home including kneeling, bending over, and sitting on stool in order to return to leisure activities safely and independently.   Baseline The pateint reports that she is independent with gardening.   Time 8   Period Weeks   Status Achieved   PT LONG TERM GOAL #3   Title The patient will improve DGI score to at least 19 in order to demonstrate improvements and safety with dynamic gait.   Baseline IMproved to 23/24.    Time 8   Period Weeks   Status Achieved   PT LONG TERM GOAL #4   Title The patient will ambulate 150' outdoors over paved surfaces and grass independently in order to demonstrate independence with community ambulation and safety going outdoors to her garden.   Baseline Per subjective reports of being independent in garden while carrying objects, etc.   Time 8   Period Weeks   Status Achieved   PT LONG TERM GOAL #5   Title The patient will improve Neuro QOL LE by at least 5 points to demonstrate improved QOL and LE function.   Baseline initial = 48.9   Time 8   Period Weeks   Status On-going               Plan - 01/28/16 1413    Clinical Impression Statement The patient met all STGs and 3/5 LTGs.  The patient was decreased to 1x/week due to meeting LTGs early.  She is very motivated to participate in HEP and has good family support.  PT to check HEP and determine if  able to d/c early.   PT Next Visit Plan Check updated HEP, gait with horizontal head turns + cognitive task is difficulty.  Check remaining 2 LTGs.   Consulted and Agree with Plan of Care Patient;Family member/caregiver   Family Member Consulted Husband - Ed        Problem List Patient Active Problem List   Diagnosis Date Noted  . ICH (intracerebral hemorrhage) ([place]) - R occipital 12/27/2015  . Atrial fibrillation with RVR ([place]) 12/27/2015  . Hyperlipidemia 12/27/2015  . Cognitive deficits 12/27/2015  . Hypothyroidism 12/27/2015  . Anxiety 12/27/2015  . Subarachnoid hemorrhage ([place]) - L frontal 12/25/2015  . Cerebellar stroke ([place]) 12/02/2014  . Osteopenia 12/01/2014  . Rhinitis, chronic 01/13/2014  . Breast cancer of upper-outer quadrant of right female breast ([place]) 09/26/2013    [place], PT 01/28/2016, 2:18 PM  [facility] [address] [place], [place], [address] Phone: [phone]   Fax:  [phone]  Name: Barbara [name] MRN: [id number] Date of Birth: 19-Jul-1951

## 2016-01-28 NOTE — Therapy (Signed)
[facility] [address] [place], [place], [address] Phone: [phone]   Fax:  [phone]  Occupational Therapy Treatment  Patient Details  Name: MERIA CRILLY MRN: [id number] Date of Birth: [birth year] Referring Provider: Dr. Rosalin Hawking  Encounter Date: 01/28/2016      OT End of Session - 01/28/16 1029    Visit Number 7   Number of Visits 17   Date for OT Re-Evaluation 03/02/16   Authorization Type [employer], G-code needed   Authorization - Visit Number 7   Authorization - Number of Visits 10   OT Start Time 1020   OT Stop Time 1054  d/c today, ended early   OT Time Calculation (min) 34 min   Activity Tolerance Patient tolerated treatment well   Behavior During Therapy [facility] for tasks assessed/performed      Past Medical History  Diagnosis Date  . Breast cancer ([place])   . Hashimoto's thyroiditis     with goiter  . Hypercholesteremia   . 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  Narrowing of the 4th and 5th lumbar interspaces.   Marland Kitchen Spina bifida ([place])     occutta L5  . Anxiety   . GERD (gastroesophageal reflux disease)   . Headache(784.0)     hx migraines  . Varicose vein     Past Surgical History  Procedure Laterality Date  . Hernia repair  1976    Left groin  . Tubal ligation    . Cystoscopy with urethral caruncle    . Mastectomy w/ sentinel node biopsy Bilateral 10/13/2013    Procedure:  BILATERAL MASTECTOMY WITH RIGHT SENTINEL LYMPH NODE BIOPSY;  Surgeon: Merrie Roof, MD;  Location: [facility];  Service: General;  Laterality: Bilateral;  . Portacath placement Left 10/13/2013    Procedure: INSERTION PORT-A-CATH;  Surgeon: Merrie Roof, MD;  Location: [facility];  Service: General;  Laterality: Left;  . Breast reconstruction with placement of tissue expander and flex hd (acellular hydrated dermis) Bilateral 10/13/2013    Procedure: PLACEMENT  OF BILATERAL TISSUE EXPANDER FOR BREAST RECONSTRUCTION ;  Surgeon: Crissie Reese, MD;  Location: [facility];  Service: Plastics;  Laterality: Bilateral;  . Wisdom tooth extraction  1990  . Port-a-cath removal Left 12/21/2014    Procedure: MINOR REMOVAL OF PORT-A-CATH;  Surgeon: Autumn Messing III, MD;  Location: [facility];  Service: General;  Laterality: Left;    There were no vitals filed for this visit.  Visit Diagnosis:  Cognitive deficits  Decreased coordination      Subjective Assessment - 01/28/16 1026    Subjective  Pt reports things are going well at home.  Husband reports that pt has driven 3 times with husband without problems and that they see MD tomorrow and will ask for clearance for return to driving.   Pertinent History hx of breast CA (2014) with chemo for 1 year and bilateral mastectomy; hx of word finding and concentration difficulties which improved after completing chemo; a-fib, hx of small punctate infarct L erebellum vs. demyelinating disease   Limitations receptive aphasia   Patient Stated Goals improve balance, be able to garden   Currently in Pain? No/denies                      OT Treatments/Exercises (OP) - 01/28/16 0001    ADLs   ADL Comments Husband/pt reports that  they feel pt is back to baseline cognitively and has returned to all previous ADLs/IADLs.  Pt reports driving with husband 3 times with problems.  Recommended pt/husband discuss and obtain driving clearance from MD (appt tomorrow). Checked goals and discussed progress.  Pt/husband agree with OT d/c at this time.   Cognitive Exercises   Financial Management Change-Making Mod complex worksheet with 100% accuracy with doing math out to the side.  (pt reports that this is baseline)   Financial Management Other Mod complex checkbook balancing (using calculator, entering info in register and finding balance, entries out of order, 8 entries).  Pt made 1 mistake where she added bill  instead of subtracting.  Pt instructed to see if she could find error (as pt typically also checks balance online) and correct error.  Pt was able to find and correct error without cueing.      Other Cognitive Exercises 1 Copying complex small peg design with L hand with good accuracy and coordination, but needed incr time      Checked 9-hole peg test:  See time below.            OT Short Term Goals - 01/28/16 1030    OT SHORT TERM GOAL #1   Title Pt will be independent with HEP for L hand coordination and strength.--check STGs 01/31/16   Status Achieved   OT SHORT TERM GOAL #2   Title Pt will perform simple home maintenance tasks with no LOB.   Status Achieved  01/16/16  gardening per pt report   OT SHORT TERM GOAL #3   Title Pt will perform simple cooking task with written directions mod I.   Status Achieved   OT SHORT TERM GOAL #4   Title Pt will perform simple financial management tasks without cueing with at least 95% accuracy.   Status Achieved  01/16/16           OT Long Term Goals - 01/28/16 1030    OT LONG TERM GOAL #1   Title Pt will perform mod complex home maintenance tasks mod I.--check LTGs 03/02/16   Time 8   Period Weeks   Status Achieved  01/16/16  has returned to gardening without LOB/difficulty (taking rest breaks after 68mn)   OT LONG TERM GOAL #2   Title Pt will perform simple-mod complex financial management tasks with at least 95% accuracy.   Time 8   Period Weeks   Status Achieved  01/28/16:  met at approx this level.   OT LONG TERM GOAL #3   Title Pt will be able to cook 2 items simultaneously safely and demonstrating good planning/divided attention.   Time 8   Period Weeks   Status Achieved  01/21/16   OT LONG TERM GOAL #4   Title Pt will improve L grip strength by at least 5lbs to assist in opening containers/lifting objects.   Baseline R-60lbs, L-47lbs   Time 8   Period Weeks   Status Achieved  01/16/16:  60lbs   OT LONG TERM GOAL #5    Title Pt will improve coordination for ADLs as shown by improving time on 9-hole peg test by at least 5sec with L hand.   Baseline R-17.84sec, L-25.42sec   Time 8   Period Weeks   Status Not Met  01/28/16:  21.66sec               Plan - 01/28/16 1100    Clinical Impression Statement Pt has met all STGs and  4/5 LTGs (but coordination [facility] for ADLs and pt is approximating goal).  Pt is appropriate for d/c at this time as she has returned to previous ADLs/IADLs and pt/husband report cognition is at baseline.   Plan d/c OT   Consulted and Agree with Plan of Care Patient;Family member/caregiver   Family Member Consulted husband       OCCUPATIONAL THERAPY DISCHARGE SUMMARY  Visits from Start of Care: 7  Current functional level related to goals / functional outcomes: See above   Remaining deficits: Pt/husband report that pt has returned to previous ADLs/IADLs and that cognition is at baseline.  L hand coordination/strength grossly WNL.   Education / Equipment: Pt instructed in coordination/hand strength HEP.  Pt/husband verbalized understanding.  Plan: Patient agrees to discharge.  Patient goals were partially met. Patient is being discharged due to meeting the stated rehab goals.  Pt met all STGs and 4/5 LTGs (approximating remaining LTG). ?????        Problem List Patient Active Problem List   Diagnosis Date Noted  . ICH (intracerebral hemorrhage) ([place]) - R occipital 12/27/2015  . Atrial fibrillation with RVR ([place]) 12/27/2015  . Hyperlipidemia 12/27/2015  . Cognitive deficits 12/27/2015  . Hypothyroidism 12/27/2015  . Anxiety 12/27/2015  . Subarachnoid hemorrhage ([place]) - L frontal 12/25/2015  . Cerebellar stroke ([place]) 12/02/2014  . Osteopenia 12/01/2014  . Rhinitis, chronic 01/13/2014  . Breast cancer of upper-outer quadrant of right female breast ([place]) 09/26/2013    [facility] 01/28/2016, 11:09 AM  [facility] [address] [place], [place], [address] Phone: [phone]   Fax:  [phone]  Name: CHRISTIANN HAGERTY MRN: [id number] Date of Birth: 1951-06-26  Vianne Bulls, OTR/L [facility] [address] [place], [place]  [address] [phone] phone [phone] 01/28/2016 11:09 AM

## 2016-01-28 NOTE — Therapy (Signed)
[facility] [address] [place], [place], [address] Phone: [phone]   Fax:  [phone]  Speech Language Pathology Treatment  Patient Details  Name: Barbara [name] MRN: [id number] Date of Birth: 1951/09/05 No Data Recorded  Encounter Date: 01/28/2016      End of Session - 01/28/16 1025    Visit Number 7   Number of Visits 17   Date for SLP Re-Evaluation 03/03/16   SLP Start Time 0935   SLP Stop Time  1017   SLP Time Calculation (min) 42 min      Past Medical History  Diagnosis Date  . Breast cancer ([place])   . Hashimoto's thyroiditis     with goiter  . Hypercholesteremia   . 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  Narrowing of the 4th and 5th lumbar interspaces.   Marland Kitchen Spina bifida ([place])     occutta L5  . Anxiety   . GERD (gastroesophageal reflux disease)   . Headache(784.0)     hx migraines  . Varicose vein     Past Surgical History  Procedure Laterality Date  . Hernia repair  1976    Left groin  . Tubal ligation    . Cystoscopy with urethral caruncle    . Mastectomy w/ sentinel node biopsy Bilateral 10/13/2013    Procedure:  BILATERAL MASTECTOMY WITH RIGHT SENTINEL LYMPH NODE BIOPSY;  Surgeon: Merrie Roof, MD;  Location: [facility];  Service: General;  Laterality: Bilateral;  . Portacath placement Left 10/13/2013    Procedure: INSERTION PORT-A-CATH;  Surgeon: Merrie Roof, MD;  Location: [facility];  Service: General;  Laterality: Left;  . Breast reconstruction with placement of tissue expander and flex hd (acellular hydrated dermis) Bilateral 10/13/2013    Procedure: PLACEMENT OF BILATERAL TISSUE EXPANDER FOR BREAST RECONSTRUCTION ;  Surgeon: Crissie Reese, MD;  Location: [facility];  Service: Plastics;  Laterality: Bilateral;  . Wisdom tooth extraction  1990  . Port-a-cath removal Left 12/21/2014    Procedure: MINOR REMOVAL OF PORT-A-CATH;  Surgeon: Autumn Messing III, MD;  Location: [facility];  Service: General;  Laterality: Left;    There were no vitals filed for this visit.  Visit Diagnosis: Aphasia      Subjective Assessment - 01/28/16 0940    Subjective "Pt completed homework with slight difficulty"               ADULT SLP TREATMENT - 01/28/16 0940    General Information   Behavior/Cognition Alert;Cooperative;Pleasant mood   Cognitive-Linquistic Treatment   Treatment focused on Aphasia   Skilled Treatment Word finding facilitated with sequential naming in categories with  85% accuracy and occasional min to mod sematnic, phonemic and written cues.  Simple conversation re: favorite trip with rare min cues for identfying and correcting errors - emergent awareness present today.    Assessment / Recommendations / Plan   Plan Continue with current plan of care   Progression Toward Goals   Progression toward goals Progressing toward goals          SLP Education - 01/28/16 1022    Education provided Yes   Education Details compensations for anomia          SLP Short Term Goals - 01/28/16 1024    SLP SHORT TERM GOAL #1   Title pt will tell SLP 3 compensations for anomia/dysnomia   Time 2  Status Achieved   SLP SHORT TERM GOAL #2   Title pt will demo emergent awareness of aphasic errors 90% of the time   Time 2   Period Weeks   Status Achieved   SLP SHORT TERM GOAL #3   Title pt will demo understanding of 10 minute simple to mod-complex conversation with pt requests for repeats allowed   Status Achieved          SLP Long Term Goals - 01/28/16 1025    SLP LONG TERM GOAL #1   Title pt will demo understanding of 10 minutes mod complex/complex conversation with pt requests for repeats allowed   Time 5   Period Weeks   Status On-going   SLP LONG TERM GOAL #2   Title pt will demo compensations to generate 10 minutes WFL mod complex/complex conversation   Time 5   Period Weeks   Status On-going           Plan - 01/28/16 1022    Clinical Impression Statement Pt demonstrates improved awareness of aphasic errors today. Utilizing compensations for aphasia with rare min cues. Continue skilled ST to maximize verbal expression for complex conversation for improved indepdence and QOL.    Speech Therapy Frequency 2x / week   Treatment/Interventions SLP instruction and feedback;Compensatory strategies;Internal/external aids;Patient/family education;Functional tasks;Language facilitation;Cueing hierarchy   Potential to Achieve Goals Good   Consulted and Agree with Plan of Care Patient;Family member/caregiver   Family Member Consulted husband        Problem List Patient Active Problem List   Diagnosis Date Noted  . ICH (intracerebral hemorrhage) ([place]) - R occipital 12/27/2015  . Atrial fibrillation with RVR ([place]) 12/27/2015  . Hyperlipidemia 12/27/2015  . Cognitive deficits 12/27/2015  . Hypothyroidism 12/27/2015  . Anxiety 12/27/2015  . Subarachnoid hemorrhage ([place]) - L frontal 12/25/2015  . Cerebellar stroke ([place]) 12/02/2014  . Osteopenia 12/01/2014  . Rhinitis, chronic 01/13/2014  . Breast cancer of upper-outer quadrant of right female breast ([place]) 09/26/2013    Barbara [name], Barbara Rusk MS, CCC-SLP 01/28/2016, 10:25 AM  [facility] [address] [place], [place], [address] Phone: [phone]   Fax:  [phone]   Name: Barbara [name] MRN: [id number] Date of Birth: September 19, 1951

## 2016-01-28 NOTE — Patient Instructions (Signed)
Feet Partial Heel-Toe, Varied Arm Positions - Eyes Closed    Stand with right foot partially in front of the other and arms out. Close eyes and visualize upright position. Hold 10____ seconds. Switch feet and repeat.   *work up to 30 seconds over time. Repeat __3__ times with each foot forward per session. Do __2__ sessions per day.  Copyright  VHI. All rights reserved.  Feet Partial Heel-Toe, Head Motion - Eyes Open    With eyes open, right foot partially in front of the other, move head slowly: up and down 10 times.  Repeat side to side head motion 10 times. Repeat __1__ times with each foot forward per session. Do __2__ sessions per day.  Copyright  VHI. All rights reserved.  Feet Together (Compliant Surface) Varied Arm Positions - Eyes Closed    Stand on compliant surface: ________ with feet together and arms out. Close eyes and visualize upright position. Hold__30__ seconds. Repeat __3__ times per session. Do _2___ sessions per day.  Copyright  VHI. All rights reserved.  Tandem Walking    Walk with each foot directly in front of other, heel of one foot touching toes of other foot with each step. Both feet straight ahead. Take a step and then hold 2 counts, then take the next step.   Up/down hallway about 10-20 steps. 2 times/day.   Copyright  VHI. All rights reserved.  Side to Side Head Motion While Multi-Tasking    Perform without assistive device. While saying alphabet out loud, walk on solid surface, turn head and eyes to left for __2__ steps. Then, turn head and eyes to opposite side for __2__ steps. Repeat sequence __10__ times per session. Do ___2_ sessions per day. Repeat in dimly lit room.  Copyright  VHI. All rights reserved.  Gaze Stabilization: Tip Card 1.Target must remain in focus, not blurry, and appear stationary while head is in motion. 2.Perform exercises with small head movements (45 to either side of midline). 3.Increase speed of head  motion so long as target is in focus. 4.If you wear eyeglasses, be sure you can see target through lens (therapist will give specific instructions for bifocal / progressive lenses). 5.These exercises may provoke dizziness or nausea. Work through these symptoms. If too dizzy, slow head movement slightly. Rest between each exercise. 6.Exercises demand concentration; avoid distractions. 7.For safety, perform standing exercises close to a counter, wall, corner, or next to someone.  Copyright  VHI. All rights reserved.  Gaze Stabilization: Standing Feet Apart   Feet shoulder width apart, keeping eyes on target on wall 3-5 feet away, tilt head down slightly and move head side to side for 60 seconds. Without glasses up and down working up to 60 seconds. Do 2 sessions per day.   Copyright  VHI. All rights reserved.

## 2016-01-29 ENCOUNTER — Ambulatory Visit (INDEPENDENT_AMBULATORY_CARE_PROVIDER_SITE_OTHER): Payer: PPO | Admitting: Diagnostic Neuroimaging

## 2016-01-29 ENCOUNTER — Encounter: Payer: Self-pay | Admitting: Diagnostic Neuroimaging

## 2016-01-29 VITALS — BP 125/66 | HR 71 | Ht 66.0 in | Wt 146.6 lb

## 2016-01-29 DIAGNOSIS — Z8673 Personal history of transient ischemic attack (TIA), and cerebral infarction without residual deficits: Secondary | ICD-10-CM | POA: Insufficient documentation

## 2016-01-29 DIAGNOSIS — R413 Other amnesia: Secondary | ICD-10-CM

## 2016-01-29 DIAGNOSIS — I619 Nontraumatic intracerebral hemorrhage, unspecified: Secondary | ICD-10-CM

## 2016-01-29 DIAGNOSIS — I680 Cerebral amyloid angiopathy: Secondary | ICD-10-CM

## 2016-01-29 DIAGNOSIS — I480 Paroxysmal atrial fibrillation: Secondary | ICD-10-CM | POA: Diagnosis not present

## 2016-01-29 DIAGNOSIS — I609 Nontraumatic subarachnoid hemorrhage, unspecified: Secondary | ICD-10-CM

## 2016-01-29 DIAGNOSIS — E854 Organ-limited amyloidosis: Secondary | ICD-10-CM
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
## 2016-01-29 NOTE — Patient Instructions (Addendum)
Thank you for coming to see [place] at [facility]. I hope we have been able to provide you high quality care today.  You may receive a patient satisfaction survey over the next few weeks. We would appreciate your feedback and comments so that we may continue to improve ourselves and the health of our patients.  - stroke prevention with atorvastatin; we cannot use aspirin or other blood thinners due to previous bleeding in brain - I will check sleep study (due to stroke, witnessed apnea, snoring) - I will check MRI brain follow up - brain / stroke healthy habits reviewed; diet, exercise and physical activity - gradually increase driving and unsupervised time alone   ~~~~~~~~~~~~~~~~~~~~~~~~~~~~~~~~~~~~~~~~~~~~~~~~~~~~~~~~~~~~~~~~~  DR. PENUMALLI'S GUIDE TO HAPPY AND HEALTHY LIVING These are some of my general health and wellness recommendations. Some of them may apply to you better than others. Please use common sense as you try these suggestions and feel free to ask me any questions.   ACTIVITY/FITNESS Mental, social, emotional and physical stimulation are very important for brain and body health. Try learning a new activity (arts, music, language, sports, games).  Keep moving your body to the best of your abilities. You can do this at home, inside or outside, the park, community center, gym or anywhere you like. Consider a physical therapist or personal trainer to get started. Consider the app Sworkit. Fitness trackers such as smart-watches, smart-phones or Fitbits can help as well.   NUTRITION Eat more plants: colorful vegetables, nuts, seeds and berries.  Eat less sugar, salt, preservatives and processed foods.  Avoid toxins such as cigarettes and alcohol.  Drink water when you are thirsty. Warm water with a slice of lemon is an excellent morning drink to start the day.  Consider these websites for more information The Nutrition Source  (https://www.henry-hernandez.biz/) [employer] Nutrition (WindowBlog.ch)   RELAXATION Consider practicing mindfulness meditation or other relaxation techniques such as deep breathing, prayer, yoga, tai chi, massage. See website mindful.org or the apps Headspace or Calm to help get started.   SLEEP Try to get at least 7-8+ hours sleep per day. Regular exercise and reduced caffeine will help you sleep better. Practice good sleep hygeine techniques. See website sleep.org for more information.   PLANNING Prepare estate planning, living will, healthcare POA documents. Sometimes this is best planned with the help of an attorney. Theconversationproject.org and agingwithdignity.org are excellent resources.

## 2016-01-30 ENCOUNTER — Encounter: Payer: PPO | Admitting: Occupational Therapy

## 2016-01-30 ENCOUNTER — Ambulatory Visit: Payer: PPO

## 2016-01-30 ENCOUNTER — Ambulatory Visit: Payer: PPO | Admitting: Physical Therapy

## 2016-01-30 DIAGNOSIS — R4701 Aphasia: Secondary | ICD-10-CM

## 2016-01-30 DIAGNOSIS — R269 Unspecified abnormalities of gait and mobility: Secondary | ICD-10-CM | POA: Diagnosis not present

## 2016-01-30 NOTE — Therapy (Signed)
[facility] [address] [place], [place], [address] Phone: [phone]   Fax:  [phone]  Speech Language Pathology Treatment  Patient Details  Name: Barbara [name] MRN: [id number] Date of Birth: 31-Oct-1951 No Data Recorded  Encounter Date: 01/30/2016      End of Session - 01/30/16 1145    Visit Number 8   Number of Visits 17   Date for SLP Re-Evaluation 03/03/16   SLP Start Time 1104   SLP Stop Time  1145   SLP Time Calculation (min) 41 min   Activity Tolerance Patient tolerated treatment well      Past Medical History  Diagnosis Date  . Breast cancer ([place])   . Hashimoto's thyroiditis     with goiter  . Hypercholesteremia   . 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  Narrowing of the 4th and 5th lumbar interspaces.   Marland Kitchen Spina bifida ([place])     occutta L5  . Anxiety   . GERD (gastroesophageal reflux disease)   . Headache(784.0)     hx migraines  . Varicose vein   . Stroke [facility]) 11/2015    Past Surgical History  Procedure Laterality Date  . Hernia repair  1976    Left groin  . Tubal ligation    . Cystoscopy with urethral caruncle    . Mastectomy w/ sentinel node biopsy Bilateral 10/13/2013    Procedure:  BILATERAL MASTECTOMY WITH RIGHT SENTINEL LYMPH NODE BIOPSY;  Surgeon: Merrie Roof, MD;  Location: [place];  Service: General;  Laterality: Bilateral;  . Portacath placement Left 10/13/2013    Procedure: INSERTION PORT-A-CATH;  Surgeon: Merrie Roof, MD;  Location: [place] [facility];  Service: General;  Laterality: Left;  . Breast reconstruction with placement of tissue expander and flex hd (acellular hydrated dermis) Bilateral 10/13/2013    Procedure: PLACEMENT OF BILATERAL TISSUE EXPANDER FOR BREAST RECONSTRUCTION ;  Surgeon: Crissie Reese, MD;  Location: [place];  Service: Plastics;  Laterality: Bilateral;  . Wisdom tooth extraction  1990  . Port-a-cath  removal Left 12/21/2014    Procedure: MINOR REMOVAL OF PORT-A-CATH;  Surgeon: Autumn Messing III, MD;  Location: [place];  Service: General;  Laterality: Left;    There were no vitals filed for this visit.  Visit Diagnosis: Aphasia      Subjective Assessment - 01/30/16 1121    Subjective "I'm doing really well. Can I come once (a week)?"   Currently in Pain? No/denies               ADULT SLP TREATMENT - 01/30/16 1123    General Information   Behavior/Cognition Alert;Cooperative;Pleasant mood   Cognitive-Linquistic Treatment   Treatment focused on Aphasia   Skilled Treatment Pt's anomia targeted by correcting her homework - req'd occasional min A with problem stimuli. Anomia further targeted by picture description (mod complex) and pt req'd SLP A rarely; success 85%.   Assessment / Recommendations / Plan   Plan Other (Comment)  reduce to once a week per pt request   Progression Toward Goals   Progression toward goals Progressing toward goals            SLP Short Term Goals - 01/30/16 1147    SLP SHORT TERM GOAL #1   Title pt will tell SLP 3 compensations for anomia/dysnomia   Status Achieved   SLP SHORT TERM GOAL #2  Title pt will demo emergent awareness of aphasic errors 90% of the time   Status Achieved   SLP SHORT TERM GOAL #3   Title pt will demo understanding of 10 minute simple to mod-complex conversation with pt requests for repeats allowed   Status Achieved          SLP Long Term Goals - 01/30/16 1148    SLP LONG TERM GOAL #1   Title pt will demo understanding of 10 minutes mod complex/complex conversation with pt requests for repeats allowed   Time 5   Period Weeks   Status On-going   SLP LONG TERM GOAL #2   Title pt will demo compensations to generate 10 minutes WFL mod complex/complex conversation   Time 5   Period Weeks   Status On-going          Plan - 01/30/16 1146    Clinical Impression Statement Pt demonstrates  improved awareness of aphasic errors today. Utilizing compensations for aphasia independently, requiring rare SLP A in mod complex linguistic tasks. Pt requested to continue skilled ST once per week to maximize verbal expression for complex conversation for improved indepdence and QOL.    Speech Therapy Frequency 1x /week   Duration --  5 weeks   Treatment/Interventions SLP instruction and feedback;Compensatory strategies;Patient/family education;Functional tasks;Language facilitation;Cueing hierarchy   Potential to Achieve Goals Good   Potential Considerations --  time post onset   Consulted and Agree with Plan of Care Patient;Family member/caregiver   Family Member Consulted husband        Problem List Patient Active Problem List   Diagnosis Date Noted  . Hemorrhagic stroke ([place]) 01/29/2016  . SAH (subarachnoid hemorrhage) ([place]) 01/29/2016  . Memory loss 01/29/2016  . Paroxysmal atrial fibrillation ([place]) 01/29/2016  . Cerebral amyloid angiopathy 01/29/2016  . ICH (intracerebral hemorrhage) ([place]) - R occipital 12/27/2015  . Atrial fibrillation with RVR ([place]) 12/27/2015  . Hyperlipidemia 12/27/2015  . Cognitive deficits 12/27/2015  . Hypothyroidism 12/27/2015  . Anxiety 12/27/2015  . Subarachnoid hemorrhage ([place]) - L frontal 12/25/2015  . Cerebellar stroke ([place]) 12/02/2014  . Osteopenia 12/01/2014  . Rhinitis, chronic 01/13/2014  . Breast cancer of upper-outer quadrant of right female breast ([place]) 09/26/2013    [facility] ,[place], [place]   01/30/2016, 11:48 AM  [facility] [address] [place], [place], [address] Phone: [phone]   Fax:  [phone]   Name: LEIGHAN PINZONE MRN: [id number] Date of Birth: [birth year]

## 2016-01-30 NOTE — Patient Instructions (Signed)
  Please complete the assigned speech therapy homework and return it to your next session.  

## 2016-02-04 ENCOUNTER — Ambulatory Visit: Payer: PPO | Admitting: Physical Therapy

## 2016-02-04 ENCOUNTER — Ambulatory Visit: Payer: PPO

## 2016-02-04 ENCOUNTER — Encounter: Payer: PPO | Admitting: Occupational Therapy

## 2016-02-04 DIAGNOSIS — R278 Other lack of coordination: Secondary | ICD-10-CM

## 2016-02-04 DIAGNOSIS — R269 Unspecified abnormalities of gait and mobility: Secondary | ICD-10-CM

## 2016-02-04 DIAGNOSIS — R279 Unspecified lack of coordination: Secondary | ICD-10-CM

## 2016-02-04 DIAGNOSIS — R4701 Aphasia: Secondary | ICD-10-CM

## 2016-02-04 DIAGNOSIS — R2681 Unsteadiness on feet: Secondary | ICD-10-CM

## 2016-02-04 NOTE — Therapy (Signed)
[facility] [address] [place], [place], [address] Phone: [phone]   Fax:  [phone]  Speech Language Pathology Treatment  Patient Details  Name: Barbara [name] MRN: [id number] Date of Birth: 1951-07-06 No Data Recorded  Encounter Date: 02/04/2016      End of Session - 02/04/16 1607    Visit Number 9   Number of Visits 17   Date for SLP Re-Evaluation 03/03/16   SLP Start Time 1104   SLP Stop Time  1145   SLP Time Calculation (min) 41 min   Activity Tolerance Patient tolerated treatment well      Past Medical History  Diagnosis Date  . Breast cancer ([place])   . Hashimoto's thyroiditis     with goiter  . Hypercholesteremia   . 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  Narrowing of the 4th and 5th lumbar interspaces.   Marland Kitchen Spina bifida ([place])     occutta L5  . Anxiety   . GERD (gastroesophageal reflux disease)   . Headache(784.0)     hx migraines  . Varicose vein   . Stroke [facility]) 11/2015    Past Surgical History  Procedure Laterality Date  . Hernia repair  1976    Left groin  . Tubal ligation    . Cystoscopy with urethral caruncle    . Mastectomy w/ sentinel node biopsy Bilateral 10/13/2013    Procedure:  BILATERAL MASTECTOMY WITH RIGHT SENTINEL LYMPH NODE BIOPSY;  Surgeon: Merrie Roof, MD;  Location: [facility];  Service: General;  Laterality: Bilateral;  . Portacath placement Left 10/13/2013    Procedure: INSERTION PORT-A-CATH;  Surgeon: Merrie Roof, MD;  Location: [facility];  Service: General;  Laterality: Left;  . Breast reconstruction with placement of tissue expander and flex hd (acellular hydrated dermis) Bilateral 10/13/2013    Procedure: PLACEMENT OF BILATERAL TISSUE EXPANDER FOR BREAST RECONSTRUCTION ;  Surgeon: Crissie Reese, MD;  Location: [place];  Service: Plastics;  Laterality: Bilateral;  . Wisdom tooth extraction  1990  . Port-a-cath  removal Left 12/21/2014    Procedure: MINOR REMOVAL OF PORT-A-CATH;  Surgeon: Autumn Messing III, MD;  Location: [facility];  Service: General;  Laterality: Left;    There were no vitals filed for this visit.  Visit Diagnosis: Aphasia      Subjective Assessment - 02/04/16 1110    Subjective "It's happening pretty good."   Patient is accompained by: --  Ed, husband               ADULT SLP TREATMENT - 02/04/16 1131    General Information   Behavior/Cognition Alert;Cooperative;Pleasant mood   Cognitive-Linquistic Treatment   Treatment focused on Aphasia   Skilled Treatment SLP facilitated pt's practice with verbal language by going over aspects of her homework with her - she req'd occasional min A for word usage/generation and for error awareness. Pt would like to cont work at home, husband agrees with this plan.   Assessment / Recommendations / Plan   Plan Discharge SLP treatment due to (comment)  pt request   Progression Toward Goals   Progression toward goals --  discharge today - see STG and LTG section            SLP Short Term Goals - 01/30/16 1147    SLP SHORT TERM GOAL #1   Title pt will tell SLP 3 compensations for  anomia/dysnomia   Status Achieved   SLP SHORT TERM GOAL #2   Title pt will demo emergent awareness of aphasic errors 90% of the time   Status Achieved   SLP SHORT TERM GOAL #3   Title pt will demo understanding of 10 minute simple to mod-complex conversation with pt requests for repeats allowed   Status Achieved          SLP Long Term Goals - 02/04/16 1138    SLP LONG TERM GOAL #1   Title pt will demo understanding of 10 minutes mod complex/complex conversation with pt requests for repeats allowed   Time 4   Period Weeks   Status Not Met   SLP LONG TERM GOAL #2   Title pt will demo compensations to generate 10 minutes WFL mod complex/complex conversation   Time 4   Period Weeks   Status Not Met          Plan -  02/04/16 1607    Clinical Impression Statement Pt is using compenstions in conversation. Reports being pleased with current functional level and can cont with homework on her own, utilizing husband's help PRN. SLP A is still req'd in mod complex strucutred linguistic tasks. Pt requested discharge today and will cont with structured tasks on her own at home.   Treatment/Interventions SLP instruction and feedback;Compensatory strategies;Patient/family education;Functional tasks;Language facilitation;Cueing hierarchy   Potential to Achieve Goals Good   Potential Considerations --  time post onset   Consulted and Agree with Plan of Care Patient;Family member/caregiver   Family Member Consulted husband       SPEECH THERAPY DISCHARGE SUMMARY  Visits from Start of Care: 9  Current functional level related to goals / functional outcomes: Pt met all STGs but requested discharge prior to meeting LTGs. She expressed satisfaction at current level and stated she could cont at home with structured tasks, and that conversation was functional for her at this time.   Remaining deficits: Mild-mod expressive aphasia, mild receptive aphasia, cognitive deficits.   Education / Equipment: Compensations for aphasia.  Plan: Patient agrees to discharge.  Patient goals were partially met. Patient is being discharged due to being pleased with the current functional level.  ?????(pt requested d/c)       Problem List Patient Active Problem List   Diagnosis Date Noted  . Hemorrhagic stroke ([place]) 01/29/2016  . SAH (subarachnoid hemorrhage) ([place]) 01/29/2016  . Memory loss 01/29/2016  . Paroxysmal atrial fibrillation ([place]) 01/29/2016  . Cerebral amyloid angiopathy 01/29/2016  . ICH (intracerebral hemorrhage) ([place]) - R occipital 12/27/2015  . Atrial fibrillation with RVR ([place]) 12/27/2015  . Hyperlipidemia 12/27/2015  . Cognitive deficits 12/27/2015  . Hypothyroidism 12/27/2015  . Anxiety 12/27/2015  .  Subarachnoid hemorrhage ([place]) - L frontal 12/25/2015  . Cerebellar stroke ([place]) 12/02/2014  . Osteopenia 12/01/2014  . Rhinitis, chronic 01/13/2014  . Breast cancer of upper-outer quadrant of right female breast ([place]) 09/26/2013    [facility] ,[place], [place]   02/04/2016, 4:10 PM  [facility] [address] [place], [place], [address] Phone: [phone]   Fax:  [phone]   Name: MARJANI KOBEL MRN: [id number] Date of Birth: 01/17/1951

## 2016-02-04 NOTE — Patient Instructions (Signed)
  Please complete the speech therapy homework throughout the coming weeks.

## 2016-02-04 NOTE — Therapy (Signed)
[facility] [address] [place], [place], [address] Phone: [phone]   Fax:  [phone]  Physical Therapy Treatment  Patient Details  Name: Barbara [name] MRN: [id number] Date of Birth: 08-May-1951 Referring Provider: Rosalin Hawking  Encounter Date: 02/04/2016      PT End of Session - 02/04/16 1229    Visit Number 9   Number of Visits 17   Date for PT Re-Evaluation 03/02/16   Authorization Type [employer]/Medicare   Authorization Time Period G codes every 10th visit   PT Start Time 0932   PT Stop Time 1016   PT Time Calculation (min) 44 min   Equipment Utilized During Treatment Gait belt   Activity Tolerance Patient tolerated treatment well   Behavior During Therapy [facility] for tasks assessed/performed      Past Medical History  Diagnosis Date  . Breast cancer ([place])   . Hashimoto's thyroiditis     with goiter  . Hypercholesteremia   . 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  Narrowing of the 4th and 5th lumbar interspaces.   Marland Kitchen Spina bifida ([place])     occutta L5  . Anxiety   . GERD (gastroesophageal reflux disease)   . Headache(784.0)     hx migraines  . Varicose vein   . Stroke [facility]) 11/2015    Past Surgical History  Procedure Laterality Date  . Hernia repair  1976    Left groin  . Tubal ligation    . Cystoscopy with urethral caruncle    . Mastectomy w/ sentinel node biopsy Bilateral 10/13/2013    Procedure:  BILATERAL MASTECTOMY WITH RIGHT SENTINEL LYMPH NODE BIOPSY;  Surgeon: Merrie Roof, MD;  Location: [place];  Service: General;  Laterality: Bilateral;  . Portacath placement Left 10/13/2013    Procedure: INSERTION PORT-A-CATH;  Surgeon: Merrie Roof, MD;  Location: [place];  Service: General;  Laterality: Left;  . Breast reconstruction with placement of tissue expander and flex hd (acellular hydrated dermis) Bilateral 10/13/2013   Procedure: PLACEMENT OF BILATERAL TISSUE EXPANDER FOR BREAST RECONSTRUCTION ;  Surgeon: Crissie Reese, MD;  Location: [facility];  Service: Plastics;  Laterality: Bilateral;  . Wisdom tooth extraction  1990  . Port-a-cath removal Left 12/21/2014    Procedure: MINOR REMOVAL OF PORT-A-CATH;  Surgeon: Autumn Messing III, MD;  Location: [place];  Service: General;  Laterality: Left;    There were no vitals filed for this visit.  Visit Diagnosis:  Abnormality of gait  Unsteadiness  Decreased coordination      Subjective Assessment - 02/04/16 0936    Subjective Pt reports she has been performing home exercises. They are "challenging but good," per pt. Since last session, pt saw Dr. Leta Baptist; was "a little thrown off" by new diagnosis of cerebral amyloid angiopathy, but neurologist explained this to her thoroughly.   Patient is accompained by: Family member   Patient Stated Goals I want to get my gait back, and carry things, bend over, and get down so I can work in my garden again.   Currently in Pain? No/denies           [facility] Adult PT Treatment/Exercise - 02/04/16 1214    Ambulation/Gait   Ambulation/Gait Yes   Ambulation/Gait Assistance 6: Modified independent (Device/Increase time);4: Min guard   Ambulation Distance (Feet) 1375 Feet   Assistive device None   Gait Pattern Step-through  pattern;Decreased arm swing - right;Decreased arm swing - left   Ambulation Surface Level;Indoor   Gait Comments Pt ambulated 50' x6 trials while performing head turn and dual task. Pt demonstrates inc difficulty with this task requiring min guard for pt safety. During performance of this task, pt wished to attempt last 2 trials with shoes off as she felt this was "throwing off her balance." No improvement noted. Pt ambulated x500' while scanning environment and performing dual task of finding sticky notes with corresponding letters and numbers (A-1 up to F-6) to improve scanning of environment  utilizing head turns during gait and performing cognitive sequencing. Pt performs this task in 4 min 48 sec and demonstrates dec speed of movement and frequent stops to scan environment. Pt ambulated 115' x5 with verbal cues to look up/down/left/right or change gait speed while performing cog dual task including counting up by 2's and naming plants. Pt required tactile cue form PT to maintain speed of movement when performing gait and frequent prompting to continue cognitive dual task.   Neuro Re-ed    Neuro Re-ed Details  Static standing in corner tandem working towards complete heel to toe with eyes open 3x30 sec holds to dec BOS and challenge balance. Static standing with narrow base, EC on foam 3x30 sec. Pt demonstrates markedly inc difficulty with this task as she lost balance and self-corrected by touching wall x3. Tandem walking 4x15' with verbal cue to dec speed of movement and emphasis on heel to toe for further challenge of balance. Pt with inc difficulty with this task when speed slowed with R LE tending to cross midline.           PT Education - 02/04/16 1228    Education provided Yes   Education Details Pt instructed to continue HEP - tandem stance eyes closed, mod tandem with head turns, tandem gait with dec speed of movement, gait with head turns and dual tasking.   Person(s) Educated Patient;Spouse   Methods Explanation;Demonstration;Tactile cues;Verbal cues;Handout   Comprehension Verbalized understanding;Returned demonstration;Verbal cues required;Tactile cues required          PT Short Term Goals - 01/28/16 1107    PT SHORT TERM GOAL #1   Title The patient will demonstrate initiation of HEP with supervision and assistance from husband as needed to maximize functional gains made in PT.   Baseline TARGET DATE = 01/30/16   Time 4   Period Weeks   Status Achieved   PT SHORT TERM GOAL #2   Title The patient will demonstrate ability to negotiate 12 steps with 1 handrail  assistance mod I and reciprocal gait pattern in order to safely reach second floor of home for bathing.   Baseline 4 steps x 4 reps without rails indep.   Time 4   Period Weeks   Status Achieved   PT SHORT TERM GOAL #3   Title The patient will ambulate 150' indoors over level surfaces at supervision level while performing horizontal head turns x2 each direction and vertical head turns x2 each direction to demonstrate improvements and safety with dynamic gait.   Baseline Pt does this modified indep with slowed pace to adapt to head turns and dynamic condition.   Time 4   Period Weeks   Status Achieved   PT SHORT TERM GOAL #4   Title The patient will demonstrate ability to move from quadruped position to tall kneeling position independently 5x with no overt LOB to simulate gardening tasks at home.  Baseline MET = 01/11/16   Time 4   Period Weeks   Status Achieved   PT SHORT TERM GOAL #5   Title The patient will improve 10 m walk test to at least 2.62 ft/sec in order to demonstrate improvent in gait speed and become safe community ambulator.   Baseline 2.71 ft/sec   Time 8   Period Weeks   Status Achieved           PT Long Term Goals - 01/28/16 1115    PT LONG TERM GOAL #1   Title The patient will demonstrate independence and safety with HEP in order to maximize functional gains made in PT.   Baseline TARGET DATE = 03/02/16   Time 8   Period Weeks   Status On-going   PT LONG TERM GOAL #2   Title The patient will return to gardening at home including kneeling, bending over, and sitting on stool in order to return to leisure activities safely and independently.   Baseline The pateint reports that she is independent with gardening.   Time 8   Period Weeks   Status Achieved   PT LONG TERM GOAL #3   Title The patient will improve DGI score to at least 19 in order to demonstrate improvements and safety with dynamic gait.   Baseline IMproved to 23/24.    Time 8   Period Weeks    Status Achieved   PT LONG TERM GOAL #4   Title The patient will ambulate 150' outdoors over paved surfaces and grass independently in order to demonstrate independence with community ambulation and safety going outdoors to her garden.   Baseline Per subjective reports of being independent in garden while carrying objects, etc.   Time 8   Period Weeks   Status Achieved   PT LONG TERM GOAL #5   Title The patient will improve Neuro QOL LE by at least 5 points to demonstrate improved QOL and LE function.   Baseline initial = 48.9   Time 8   Period Weeks   Status On-going           Plan - 02/04/16 1216    Clinical Impression Statement The patient continues to demonstrate difficulty and inc postural sway with dec visual input and dec BOS such as tandem stance/walking. The patient demonstrates markedly dec quality of movement during ambulation combined with high level activities such as head turns and dual tasking. The patient will benefit from continued PT to challenge dynamic mobility with cognitive dual tasking to improve quality of movement and overall safety with functional mobility.   Pt will benefit from skilled therapeutic intervention in order to improve on the following deficits Abnormal gait;Decreased balance;Decreased mobility;Decreased strength;Difficulty walking;Impaired vision/preception   Rehab Potential Good   Clinical Impairments Affecting Rehab Potential good support system - husband   PT Frequency 2x / week   PT Duration 8 weeks   PT Treatment/Interventions ADLs/Self Care Home Management;Biofeedback;Canalith Repostioning;Electrical Stimulation;Moist Heat;Cryotherapy;DME Instruction;Gait training;Stair training;Functional mobility training;Therapeutic activities;Therapeutic exercise;Balance training;Neuromuscular re-education;Patient/family education;Orthotic Fit/Training;Manual techniques;Passive range of motion;Vestibular;Visual/perceptual remediation/compensation   PT Next  Visit Plan PROGRESS NOTE - 10th visit. Check HEP and progress if needed. Gait with cognitive dual task - push speed of movement. Consider trying on treadmill.   Consulted and Agree with Plan of Care Patient;Family member/caregiver   Family Member Consulted Husband - Ed        Problem List Patient Active Problem List   Diagnosis Date Noted  . Hemorrhagic stroke ([place]) 01/29/2016  .  SAH (subarachnoid hemorrhage) ([place]) 01/29/2016  . Memory loss 01/29/2016  . Paroxysmal atrial fibrillation ([place]) 01/29/2016  . Cerebral amyloid angiopathy 01/29/2016  . ICH (intracerebral hemorrhage) ([place]) - R occipital 12/27/2015  . Atrial fibrillation with RVR ([place]) 12/27/2015  . Hyperlipidemia 12/27/2015  . Cognitive deficits 12/27/2015  . Hypothyroidism 12/27/2015  . Anxiety 12/27/2015  . Subarachnoid hemorrhage ([place]) - L frontal 12/25/2015  . Cerebellar stroke ([place]) 12/02/2014  . Osteopenia 12/01/2014  . Rhinitis, chronic 01/13/2014  . Breast cancer of upper-outer quadrant of right female breast ([place]) 09/26/2013    De Nurse, SPT 02/04/2016, 12:31 PM  [facility] [address] [place], [place], [address] Phone: [phone]   Fax:  [phone]  Name: Barbara [name] MRN: [id number] Date of Birth: July 19, 1951

## 2016-02-06 ENCOUNTER — Ambulatory Visit: Payer: PPO | Admitting: Physical Therapy

## 2016-02-06 ENCOUNTER — Encounter: Payer: PPO | Admitting: Occupational Therapy

## 2016-02-11 ENCOUNTER — Encounter: Payer: PPO | Admitting: Occupational Therapy

## 2016-02-11 ENCOUNTER — Ambulatory Visit: Payer: PPO | Admitting: Physical Therapy

## 2016-02-11 DIAGNOSIS — R269 Unspecified abnormalities of gait and mobility: Secondary | ICD-10-CM | POA: Diagnosis not present

## 2016-02-11 DIAGNOSIS — R279 Unspecified lack of coordination: Secondary | ICD-10-CM

## 2016-02-11 DIAGNOSIS — R2681 Unsteadiness on feet: Secondary | ICD-10-CM

## 2016-02-11 DIAGNOSIS — R278 Other lack of coordination: Secondary | ICD-10-CM

## 2016-02-11 NOTE — Therapy (Signed)
[facility] [address] [place], [place], [address] Phone: [phone]   Fax:  [phone]  Physical Therapy Treatment  Patient Details  Name: Barbara [name] MRN: [id number] Date of Birth: 1951/11/12 Referring Provider: Rosalin Hawking  Encounter Date: 02/11/2016      PT End of Session - 02/11/16 2223    Visit Number 10   Number of Visits 17   Date for PT Re-Evaluation 03/02/16   Authorization Type [employer]/Medicare   Authorization Time Period G codes every 10th visit   PT Start Time 1018   PT Stop Time 1107   PT Time Calculation (min) 49 min   Equipment Utilized During Treatment Gait belt   Activity Tolerance Patient tolerated treatment well   Behavior During Therapy [facility] for tasks assessed/performed      Past Medical History  Diagnosis Date  . Breast cancer ([place])   . Hashimoto's thyroiditis     with goiter  . Hypercholesteremia   . 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  Narrowing of the 4th and 5th lumbar interspaces.   Marland Kitchen Spina bifida ([place])     [place] L5  . Anxiety   . GERD (gastroesophageal reflux disease)   . Headache(784.0)     hx migraines  . Varicose vein   . Stroke [facility]) 11/2015    Past Surgical History  Procedure Laterality Date  . Hernia repair  1976    Left groin  . Tubal ligation    . Cystoscopy with urethral caruncle    . Mastectomy w/ sentinel node biopsy Bilateral 10/13/2013    Procedure:  BILATERAL MASTECTOMY WITH RIGHT SENTINEL LYMPH NODE BIOPSY;  Surgeon: Merrie Roof, MD;  Location: [place];  Service: General;  Laterality: Bilateral;  . Portacath placement Left 10/13/2013    Procedure: INSERTION PORT-A-CATH;  Surgeon: Merrie Roof, MD;  Location: [place];  Service: General;  Laterality: Left;  . Breast reconstruction with placement of tissue expander and flex hd (acellular hydrated dermis) Bilateral 10/13/2013   Procedure: PLACEMENT OF BILATERAL TISSUE EXPANDER FOR BREAST RECONSTRUCTION ;  Surgeon: Crissie Reese, MD;  Location: [place];  Service: Plastics;  Laterality: Bilateral;  . Wisdom tooth extraction  1990  . Port-a-cath removal Left 12/21/2014    Procedure: MINOR REMOVAL OF PORT-A-CATH;  Surgeon: Autumn Messing III, MD;  Location: [place];  Service: General;  Laterality: Left;    There were no vitals filed for this visit.  Visit Diagnosis:  Unsteadiness  Decreased coordination      Subjective Assessment - 02/11/16 1020    Subjective Pt reports no falls, no significant changes.    Patient is accompained by: Family member  husband   Patient Stated Goals I want to get my gait back, and carry things, bend over, and get down so I can work in my garden again.   Currently in Pain? No/denies            [facility] PT Assessment - 02/11/16 0001    Dynamic Gait Index   Level Surface Normal   Change in Gait Speed Normal   Gait with Horizontal Head Turns Normal   Gait with Vertical Head Turns Normal   Gait and Pivot Turn Normal   Step Over Obstacle Normal   Step Around Obstacles Normal   Steps Normal   Total Score 24   Functional Gait  Assessment   Gait  assessed  Yes   Gait Level Surface Walks 20 ft in less than 5.5 sec, no assistive devices, good speed, no evidence for imbalance, normal gait pattern, deviates no more than 6 in outside of the 12 in walkway width.   Change in Gait Speed Able to smoothly change walking speed without loss of balance or gait deviation. Deviate no more than 6 in outside of the 12 in walkway width.   Gait with Horizontal Head Turns Performs head turns smoothly with no change in gait. Deviates no more than 6 in outside 12 in walkway width   Gait with Vertical Head Turns Performs head turns with no change in gait. Deviates no more than 6 in outside 12 in walkway width.   Gait and Pivot Turn Pivot turns safely within 3 sec and stops quickly with no loss of  balance.   Step Over Obstacle Is able to step over 2 stacked shoe boxes taped together (9 in total height) without changing gait speed. No evidence of imbalance.   Gait with Narrow Base of Support Is able to ambulate for 10 steps heel to toe with no staggering.   Gait with Eyes Closed Walks 20 ft, uses assistive device, slower speed, mild gait deviations, deviates 6-10 in outside 12 in walkway width. Ambulates 20 ft in less than 9 sec but greater than 7 sec.  8.87 sec   Ambulating Backwards Walks 20 ft, slow speed, abnormal gait pattern, evidence for imbalance, deviates 10-15 in outside 12 in walkway width.  12.3 seconds   Steps Alternating feet, no rail.   Total Score 27                     [facility] Adult PT Treatment/Exercise - 02/11/16 0001    Ambulation/Gait   Ambulation/Gait Yes   Ambulation/Gait Assistance 7: Independent   Ambulation Distance (Feet) 500 Feet   Assistive device None   Gait Pattern Within Functional Limits   Ambulation Surface Level;Indoor   Neuro Re-ed    Neuro Re-ed Details  Reviewed all prior home exercises, which pt performed indepenently. Progressed all home balance exercises to continue to challenge pt's balance beyond DC. Pt gave effective return demo of 1 set of each exercise; see Pt Instructions for details on all home balance exercises, reps, sets, frequency, and duration.                PT Education - 02/11/16 1040    Education provided Yes   Education Details PT goals, findings, progress, and DC plan.  HEP: discussed safe/appropriate progression after DC from PT; see Pt Instructions.    Person(s) Educated Patient;Spouse   Methods Explanation;Demonstration;Handout   Comprehension Verbalized understanding;Returned demonstration          PT Short Term Goals - 01/28/16 1107    PT SHORT TERM GOAL #1   Title The patient will demonstrate initiation of HEP with supervision and assistance from husband as needed to maximize functional  gains made in PT.   Baseline TARGET DATE = 01/30/16   Time 4   Period Weeks   Status Achieved   PT SHORT TERM GOAL #2   Title The patient will demonstrate ability to negotiate 12 steps with 1 handrail assistance mod I and reciprocal gait pattern in order to safely reach second floor of home for bathing.   Baseline 4 steps x 4 reps without rails indep.   Time 4   Period Weeks   Status Achieved   PT SHORT  TERM GOAL #3   Title The patient will ambulate 150' indoors over level surfaces at supervision level while performing horizontal head turns x2 each direction and vertical head turns x2 each direction to demonstrate improvements and safety with dynamic gait.   Baseline Pt does this modified indep with slowed pace to adapt to head turns and dynamic condition.   Time 4   Period Weeks   Status Achieved   PT SHORT TERM GOAL #4   Title The patient will demonstrate ability to move from quadruped position to tall kneeling position independently 5x with no overt LOB to simulate gardening tasks at home.   Baseline MET = 01/11/16   Time 4   Period Weeks   Status Achieved   PT SHORT TERM GOAL #5   Title The patient will improve 10 m walk test to at least 2.62 ft/sec in order to demonstrate improvent in gait speed and become safe community ambulator.   Baseline 2.71 ft/sec   Time 8   Period Weeks   Status Achieved           PT Long Term Goals - 02-15-16 1049    PT LONG TERM GOAL #1   Title The patient will demonstrate independence and safety with HEP in order to maximize functional gains made in PT.   Baseline Met 2023/02/15.   Time 8   Period Weeks   Status On-going   PT LONG TERM GOAL #2   Title The patient will return to gardening at home including kneeling, bending over, and sitting on stool in order to return to leisure activities safely and independently.   Baseline The pateint reports that she is independent with gardening.   Time 8   Period Weeks   Status Achieved   PT LONG TERM GOAL  #3   Title The patient will improve DGI score to at least 19 in order to demonstrate improvements and safety with dynamic gait.   Baseline IMproved to 23/24.    Time 8   Period Weeks   Status Achieved   PT LONG TERM GOAL #4   Title The patient will ambulate 150' outdoors over paved surfaces and grass independently in order to demonstrate independence with community ambulation and safety going outdoors to her garden.   Baseline Per subjective reports of being independent in garden while carrying objects, etc.   Time 8   Period Weeks   Status Achieved   PT LONG TERM GOAL #5   Title The patient will improve Neuro QOL LE by at least 5 points to demonstrate improved QOL and LE function.   Baseline initial = 48.9.  Final February 15, 2023) = 62.3   Time 8   Period Weeks   Status Achieved               Plan - 2016-02-15 1038    Clinical Impression Statement Patient has met all short and long term goals, improved DGI from 14/24 to 24/24, and score 28/30 on FGA during this session. Pt perceives no limitation in functional mobility or participation in leisure activities. Therefore, pt will be discharged from outpaitient PT at this time time. Pt/husband educated on PT goals, findings, progress, and DC plan with verbal understanding from pt and husband.   Consulted and Agree with Plan of Care Patient;Family member/caregiver   Family Member Consulted Husband Andres Labrum - 2016-02-15 2217-01-20    Functional Assessment Tool Used DGI 24/24; FGA 28/30  Functional Limitation Mobility: Walking and moving around   Mobility: Walking and Moving Around Goal Status [phone]) At least 20 percent but less than 40 percent impaired, limited or restricted   Mobility: Walking and Moving Around Discharge Status [phone]) At least 1 percent but less than 20 percent impaired, limited or restricted      Problem List Patient Active Problem List   Diagnosis Date Noted  . Hemorrhagic stroke ([place]) 01/29/2016  . SAH  (subarachnoid hemorrhage) ([place]) 01/29/2016  . Memory loss 01/29/2016  . Paroxysmal atrial fibrillation ([place]) 01/29/2016  . Cerebral amyloid angiopathy 01/29/2016  . ICH (intracerebral hemorrhage) ([place]) - R occipital 12/27/2015  . Atrial fibrillation with RVR ([place]) 12/27/2015  . Hyperlipidemia 12/27/2015  . Cognitive deficits 12/27/2015  . Hypothyroidism 12/27/2015  . Anxiety 12/27/2015  . Subarachnoid hemorrhage ([place]) - L frontal 12/25/2015  . Cerebellar stroke ([place]) 12/02/2014  . Osteopenia 12/01/2014  . Rhinitis, chronic 01/13/2014  . Breast cancer of upper-outer quadrant of right female breast ([place]) 09/26/2013   PHYSICAL THERAPY DISCHARGE SUMMARY  Visits from Start of Care: 10  Current functional level related to goals / functional outcomes: See above goals and goal statuses; patient has surpassed all short and long term goals.    Remaining deficits: Pt continues to demonstrate high level balance impairments when dual-tasking with functional mobility. Pt is proficient performing home exercises to address said impairments.   Education / Equipment: HEP and progression.  Plan: Patient agrees to discharge.  Patient goals were met. Patient is being discharged due to meeting the stated rehab goals.  ?????        Billie Ruddy, PT, DPT [facility] [address] [place], [place], [address] Phone: [phone]   Fax:  [phone] 02/11/2016, 10:25 PM  Name: Barbara [name] MRN: [id number] Date of Birth: Nov 23, 1951

## 2016-02-11 NOTE — Patient Instructions (Addendum)
Feet Partial Heel-Toe, Varied Arm Positions - Eyes Closed    Stand with right heel next to left toes with arms by your side. Close eyes and visualize upright position. Hold _20_ seconds. Switch feet and repeat.  *work up to 30 seconds over time. Repeat __3__ times with each foot forward per session. Do __2__ sessions per day.  Feet Heel-Toe "Tandem"    Arms by your side, stand statically with right foot directly in front of left foot. Try to hold for 15 seconds without losing balance. Then, switch feet. *work up to 30 seconds over time. Repeat __3__ times with each foot forward per session. Do __2__ sessions per day.    Copyright  VHI. All rights reserved.  Feet Partial Heel-Toe, Head Motion - Eyes Open    With eyes open, right heel next to left toes with arms by your side, move head slowly: up and down 5 times. Repeat side to side head motion 5 times. Then, switch foot position and repeat 5 head turns up/down and 5 head turns right/left. Repeat __1__ times with each foot forward per session. Do __2__ sessions per day.  Copyright  VHI. All rights reserved.  Feet Together (Compliant Surface) Varied Arm Positions - Eyes Closed    Stand on couch cushion with feet together and arms by your side. Close eyes and visualize upright position. Hold__30__ seconds. Repeat __3__ times per session. Do _2___ sessions per day.   Copyright  VHI. All rights reserved.  Tandem Walking    Walk with each foot directly in front of other, heel of one foot touching toes of other foot with each step. Both feet straight ahead. Take a step and then hold 2 counts, then take the next step.  Up/down hallway about 10-20 steps. 2 times/day.  When you're to consistently perform tandem walking for 20 steps without loss of balance, progress to backwards tandem walking. Make sure you stand next to a stable surface at first.  Copyright  VHI. All rights reserved.  Side to Side Head Motion While  Multi-Tasking    Perform without assistive device. While saying alphabet out loud, walk on solid surface, turn head and eyes to left for __2__ steps. Then, turn head and eyes to opposite side for __2__ steps. Repeat sequence __10__ times per session. Do ___2_ sessions per day. Repeat in dimly lit room. As this gets easier, progress difficulty of thinking task (counting backwards).  Copyright  VHI. All rights reserved.  Gaze Stabilization: Tip Card 1.Target must remain in focus, not blurry, and appear stationary while head is in motion. 2.Perform exercises with small head movements (45 to either side of midline). 3.Increase speed of head motion so long as target is in focus. 4.If you wear eyeglasses, be sure you can see target through lens (therapist will give specific instructions for bifocal / progressive lenses). 5.These exercises may provoke dizziness or nausea. Work through these symptoms. If too dizzy, slow head movement slightly. Rest between each exercise. 6.Exercises demand concentration; avoid distractions. 7.For safety, perform standing exercises close to a counter, wall, corner, or next to someone.  Copyright  VHI. All rights reserved.  Gaze Stabilization: Standing Feet Apart   Feet shoulder width apart, keeping eyes on target on wall 3-5 feet away, tilt head down slightly and move head side to side for 60 seconds. Without glasses up and down working up to 60 seconds. Do 2 sessions per day.  [place] - as this gets easier, progress by increasing speed of head  movement (so long as target is in focus and not moving). When you're able to perform this exercise easily for 60 consecutive seconds, perform the exercise with feet together.

## 2016-02-12 ENCOUNTER — Encounter: Payer: Self-pay | Admitting: Neurology

## 2016-02-12 ENCOUNTER — Ambulatory Visit (INDEPENDENT_AMBULATORY_CARE_PROVIDER_SITE_OTHER): Payer: PPO | Admitting: Neurology

## 2016-02-12 VITALS — BP 126/60 | HR 62 | Resp 16 | Ht 66.0 in | Wt 146.0 lb

## 2016-02-12 DIAGNOSIS — I609 Nontraumatic subarachnoid hemorrhage, unspecified: Secondary | ICD-10-CM | POA: Diagnosis not present

## 2016-02-12 DIAGNOSIS — R351 Nocturia: Secondary | ICD-10-CM

## 2016-02-12 DIAGNOSIS — I480 Paroxysmal atrial fibrillation: Secondary | ICD-10-CM | POA: Diagnosis not present

## 2016-02-12 DIAGNOSIS — I619 Nontraumatic intracerebral hemorrhage, unspecified: Secondary | ICD-10-CM

## 2016-02-12 DIAGNOSIS — R0681 Apnea, not elsewhere classified: Secondary | ICD-10-CM | POA: Diagnosis not present

## 2016-02-12 DIAGNOSIS — R0683 Snoring: Secondary | ICD-10-CM | POA: Diagnosis not present

## 2016-02-12 NOTE — Progress Notes (Signed)
Subjective:    Patient ID: Barbara [name] is a 65 y.o. female.  HPI     Star Age, MD, PhD [facility] [address] [place], [place] [address]  Dear [name],   I saw your patient, Barbara [name], upon your kind request in my clinic today for initial consultation of her sleep disorder, in particular, concern for underlying obstructive sleep apnea. The patient is accompanied by her husband today. As you know, Barbara [name] is a 65 year old right-handed woman with an underlying medical history of spina bifida, anxiety, reflux disease, migraine headaches, breast cancer, Hashimoto thyroiditis with history of goiter, hyperlipidemia, memory loss, cerebral amyloid angiopathy with history of hemorrhagic stroke, with intraocular cerebral hemorrhage and subarachnoid hemorrhage in January 2017, with new diagnosis of paroxysmal A. fib, who reports snoring, and EDS. Her husband has noted apneic pauses in her sleep. Her daughter-in-law, who is a Designer, jewellery has also witnessed apneic pauses in her sleep. She has nocturia about once a night. She has some aching in her thighs since she started atorvastatin. She has not seen a cardiologist for her paroxysmal A. fib. She has seen her primary care physician after her hospital discharge and had an EKG. Bedtime is around 9 PM, sometimes it takes her 30-60 minutes to fall asleep but she does not take anything for sleep. Right time is between 7:30 and 8:30. Epworth sleepiness score is 2 out of 24, her fatigue score is 9 out of 63. She is somewhat tired during the day at times. She used to have morning headaches in the past but nothing recently. She denies actual restless legs type symptoms but has to sleep with a pillow between her legs. She also has a history of scoliosis which makes her uncomfortable sleeping on her back, she is a side sleeper. Her son has obstructive sleep apnea. She lives with her husband. 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 She drinks alcohol very rarely, caffeine about half a cap of coffee, nonsmoker. She has 2 grown children.  They do not have any pets. They do not watch TV in bed.   I reviewed your office note from 01/29/2016. She had a head CT without contrast on 12/24/2015: IMPRESSION: 1. Focus of acute hemorrhage in the right occipital lobe near the gray-white junction. This could reflect a hemorrhagic metastasis or hemorrhage from amyloid angiopathy. There is no history of trauma or anti coagulation. 2. Possible cortical thickening in the left frontal lobe suggesting possible cerebritis. 3. MRI without and with contrast recommended for further evaluation. 4. Critical Value/emergent results were called by telephone at the time of interpretation on 12/24/2015 at 9:40 pm to Dr. Virgel Manifold, who verbally acknowledged these results.  In addition, personally reviewed the images through the PACS system.   She had a brain MRI with and without contrast on 12/24/2015: IMPRESSION: 1. 14 mm acute parenchymal hemorrhage within the right occipital lobe without significant mass effect. No underlying lesion identified. 2. Scattered acute subarachnoid hemorrhage within the left frontal lobe. Etiology of the right occipital and left frontal hemorrhage is uncertain. Primary differential considerations include possible occult trauma or underlying coagulopathy. Hemorrhage related to underlying hypertension could also be considered. Presence of subarachnoid blood is not typical of underlying amyloid angiopathy. The major dural sinuses appear patent. 3. Scattered chronic micro hemorrhages involving both cerebral hemispheres, nonspecific, but most commonly related to chronic underlying hypertension. 4. Mild chronic small vessel ischemic disease. In addition,  I personally reviewed the images through the PACS system.  Her Past Medical History Is Significant For: Past Medical  History  Diagnosis Date  . Breast cancer ([place])   . Hashimoto's thyroiditis     with goiter  . Hypercholesteremia   . 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  Narrowing of the 4th and 5th lumbar interspaces.   Marland Kitchen Spina bifida ([place])     [place] L5  . Anxiety   . GERD (gastroesophageal reflux disease)   . Headache(784.0)     hx migraines  . Varicose vein   . Stroke [facility]) 11/2015    Her Past Surgical History Is Significant For: Past Surgical History  Procedure Laterality Date  . Hernia repair  1976    Left groin  . Tubal ligation    . Cystoscopy with urethral caruncle    . Mastectomy w/ sentinel node biopsy Bilateral 10/13/2013    Procedure:  BILATERAL MASTECTOMY WITH RIGHT SENTINEL LYMPH NODE BIOPSY;  Surgeon: Merrie Roof, MD;  Location: [place];  Service: General;  Laterality: Bilateral;  . Portacath placement Left 10/13/2013    Procedure: INSERTION PORT-A-CATH;  Surgeon: Merrie Roof, MD;  Location: [place];  Service: General;  Laterality: Left;  . Breast reconstruction with placement of tissue expander and flex hd (acellular hydrated dermis) Bilateral 10/13/2013    Procedure: PLACEMENT OF BILATERAL TISSUE EXPANDER FOR BREAST RECONSTRUCTION ;  Surgeon: Crissie Reese, MD;  Location: [place];  Service: Plastics;  Laterality: Bilateral;  . Wisdom tooth extraction  1990  . Port-a-cath removal Left 12/21/2014    Procedure: MINOR REMOVAL OF PORT-A-CATH;  Surgeon: Autumn Messing III, MD;  Location: [place];  Service: General;  Laterality: Left;    Her Family History Is Significant For: Family History  Problem Relation Age of Onset  . Breast cancer Mother 78  . Lung cancer Father   . Cancer Father   . Heart attack Father   . Breast cancer Sister 66    Bilateral breast cancer with 2nd dx at 48  . Breast cancer Maternal Aunt 52  . Breast cancer Sister 15  . Lung cancer Maternal Uncle   . Leukemia Paternal Uncle   .  Melanoma Brother 52    on ear  . Prostate cancer Brother 55  . Cancer Cousin     maternal cousin with cancer - NOS    Her Social History Is Significant For: Social History   Social History  . Marital Status: Married    Spouse Name: Percell Miller  . Number of Children: 2  . Years of Education: LPN   Occupational History  . Retired     [employer].   Social History Main Topics  . Smoking status: Never Smoker   . Smokeless tobacco: Never Used  . Alcohol Use: 0.0 oz/week    0 Standard drinks or equivalent per week     Comment: Rare  . Drug Use: No  . Sexual Activity: No     Comment: BTL   Other Topics Concern  . None   Social History Narrative   Patient lives at home with her spouse.   Caffeine Use:0.5-1 cup of coffee in the a.m.    Her Allergies Are:  Allergies  Allergen Reactions  . Buprenex [Buprenorphine] Nausea And Vomiting and Other (See Comments)    Oversedation  . Simvastatin Other (See Comments)    Myalgias   .  Codeine Hives, Itching and Rash  :   Her Current Medications Are:  Outpatient Encounter Prescriptions as of 02/12/2016  Medication Sig  . Acetaminophen (TYLENOL PO) Take by mouth as needed (dosage unspecified).  Marland Kitchen anastrozole (ARIMIDEX) 1 MG tablet Take 1 tablet (1 mg total) by mouth daily.  Marland Kitchen atorvastatin (LIPITOR) 10 MG tablet Take 10 mg by mouth at bedtime.   Marland Kitchen BIOTIN PO Take 5,000 mcg by mouth daily.   . cholecalciferol 5000 UNITS TABS Take 1 tablet (5,000 Units total) by mouth daily.  . fexofenadine (ALLEGRA) 180 MG tablet Take 180 mg by mouth daily.  . Fluticasone Propionate (FLONASE NA) Place into the nose as needed.  Marland Kitchen levothyroxine (SYNTHROID, LEVOTHROID) 50 MCG tablet Take 50 mcg by mouth daily before breakfast. Take 50 mcg 1 hours before breakfast.  . OMEPRAZOLE PO Take by mouth daily.  Marland Kitchen PARoxetine (PAXIL) 20 MG tablet Take 20 mg by mouth at bedtime.   Marland Kitchen Propylene Glycol (SYSTANE BALANCE) 0.6 % SOLN Apply 1 drop to eye daily as needed  (dry eyes).   No facility-administered encounter medications on file as of 02/12/2016.  :  Review of Systems:  Out of a complete 14 point review of systems, all are reviewed and negative with the exception of these symptoms as listed below:   Review of Systems  Neurological:       Memory loss Patient reports some trouble falling asleep, wakes up some in the night, witnessed apnea, wakes up feeling tired, sometimes takes naps  Epworth Sleepiness Scale 0= would never doze 1= slight chance of dozing 2= moderate chance of dozing 3= high chance of dozing  Sitting and reading:0 Watching TV:0 Sitting inactive in a public place (ex. Theater or meeting):0 As a passenger in a car for an hour without a break:1 Lying down to rest in the afternoon:1 Sitting and talking to someone:0 Sitting quietly after lunch (no alcohol):0 In a car, while stopped in traffic:0 Total:2   Objective:  Neurologic Exam  Physical Exam Physical Examination:   Filed Vitals:   02/12/16 0916  BP: 126/60  Pulse: 62  Resp: 16   General Examination: The patient is a very pleasant 65 y.o. female in no acute distress. She appears well-developed and well-nourished and well groomed.   HEENT: Normocephalic, atraumatic, pupils are equal, round and reactive to light and accommodation. She has bilateral cataracts.Funduscopic exam is normal with sharp disc margins noted. Extraocular tracking is good without limitation to gaze excursion or nystagmus noted. Normal smooth pursuit is noted. Hearing is grossly intact. Face is symmetric with normal facial animation and normal facial sensation. Speech is mildly dysarthric. There is no hypophonia. There is no lip, neck/head, jaw or voice tremor. Neck is supple with full range of passive and active motion. There are no carotid bruits on auscultation. Oropharynx exam reveals: mild mouth dryness, adequate dental hygiene and mild airway crowding, due to redundant soft palate and smaller  airway entry. Mallampati is class I. Tongue protrudes centrally and palate elevates symmetrically. Tonsismall bilaterally. Neck size is 13.5 inches. She has a Mild and Moderate overbite.    Chest: Clear to auscultation without wheezing, rhonchi or crackles noted.  Heart: S1+S2+0, regular and normal without murmurs, rubs or gallops noted.   Abdomen: Soft, non-tender and non-distended with normal bowel sounds appreciated on auscultation.  Extremities: There is trace pitting edema in the distal lower extremities bilaterally. she is wearing compression stockings because of her varicose veins, she had varicose vein surgery as  well.   Skin: Warm and dry without trophic changes noted.   Musculoskeletal: exam reveals no obvious joint deformities, tenderness or joint swelling or erythema, with the exception of mild scoliosis.   Neurologically:  Mental status: The patient is awake, alert and oriented in all 4 spheres. Her immediate and remote memory, attention, language skills and fund of knowledge are very mildly impaired, she has mild slowness in thinking and mild word finding difficulties. She has mild dysarthria. Thought process is linear. Mood is normal and affect is normal.  Cranial nerves II - XII are as described above under HEENT exam. In addition: shoulder shrug is normal with equal shoulder height noted. Motor exam: Normal bulk, strength and tone is noted. There is no drift, tremor or rebound. Romberg is negative. Reflexes are 2+ to 3+ throughout. Fine motor skills and coordination:  very mildly slow. Very mild difficulty with heel to shin.   Sensory exam: intact to light touch, pinprick, vibration, temperature sense in the upper and lower extremities.  Gait, station and balance: She stands easily. No veering to one side is noted. No leaning to one side is noted. Posture is age-appropriate and stance is narrow based. Gait shows normal stride length and normal pace. No problems turning are noted.  She turns slightly slowly, tandem walk is mildly challenging for her.   Assessment and Plan:  In summary, Barbara [name] is a very pleasant 65 y.o.-year old female with an underlying medical history of spina bifida, anxiety, reflux disease, migraine headaches, breast cancer, Hashimoto thyroiditis with history of goiter, hyperlipidemia, memory loss, cerebral amyloid angiopathy with history of hemorrhagic stroke, with intraocular cerebral hemorrhage and subarachnoid hemorrhage in January 2017, with new diagnosis of paroxysmal A. fib, whose history and physical exam are indeed concerning for obstructive sleep apnea (OSA).Especially in light of her recent stroke and A. fib diagnosis, workup with a sleep study is indicated and warranted. I had a long chat with the patient and her husband about my findings and the diagnosis of OSA, its prognosis and treatment options. We talked about medical treatments, surgical interventions and non-pharmacological approaches. I explained in particular the risks and ramifications of untreated moderate to severe OSA, especially with respect to developing cardiovascular disease down the Road, including congestive heart failure, difficult to treat hypertension, cardiac arrhythmias, or stroke. Even type 2 diabetes has, in part, been linked to untreated OSA. Symptoms of untreated OSA include daytime sleepiness, memory problems, mood irritability and mood disorder such as depression and anxiety, lack of energy, as well as recurrent headaches, especially morning headaches. We talked about trying to maintain a healthy lifestyle in general, as well as the importance of weight control. I encouraged the patient to eat healthy, exercise daily and keep well hydrated, to keep a scheduled bedtime and wake time routine, to not skip any meals and eat healthy snacks in between meals. I advised the patient not to drive when feeling sleepy. I recommended the following at this time: sleep study with  potential positive airway pressure titration. (We will score hypopneas at 4% and split the sleep study into diagnostic and treatment portion, if the estimated. 2 hour AHI is >15/h).   I explained the sleep test procedure to the patient and also outlined possible surgical and non-surgical treatment options of OSA, including the use of a custom-made dental device (which would require a referral to a specialist dentist or oral surgeon), upper airway surgical options, such as pillar implants, radiofrequency surgery, tongue base surgery, and  UPPP (which would involve a referral to an ENT surgeon). Rarely, jaw surgery such as mandibular advancement may be considered.  I also explained the CPAP treatment option to the patient, who indicated that she would be willing to try CPAP if the need arises. I explained the importance of being compliant with PAP treatment, not only for insurance purposes but primarily to improve Her symptoms, and for the patient's long term health benefit, including to reduce Her cardiovascular risks. I answered all their questions today and the patient and her husband were in agreement. I would like to see her back after the sleep study is completed and encouraged her to call with any interim questions, concerns, problems or updates.   Thank you very much for allowing me to participate in the care of this nice patient. If I can be of any further assistance to you please do not hesitate to talk to me.   Sincerely,   Star Age, MD, PhD

## 2016-02-12 NOTE — Patient Instructions (Signed)

## 2016-02-13 ENCOUNTER — Encounter: Payer: PPO | Admitting: Occupational Therapy

## 2016-02-13 ENCOUNTER — Ambulatory Visit: Payer: PPO | Admitting: Physical Therapy

## 2016-02-17 ENCOUNTER — Encounter: Payer: Self-pay | Admitting: *Deleted

## 2016-02-18 ENCOUNTER — Ambulatory Visit: Payer: PPO | Admitting: Physical Therapy

## 2016-02-18 ENCOUNTER — Encounter: Payer: PPO | Admitting: Occupational Therapy

## 2016-02-20 ENCOUNTER — Encounter: Payer: PPO | Admitting: Occupational Therapy

## 2016-02-20 ENCOUNTER — Ambulatory Visit: Payer: PPO | Admitting: Physical Therapy

## 2016-02-25 ENCOUNTER — Ambulatory Visit: Payer: PPO | Admitting: Physical Therapy

## 2016-02-25 ENCOUNTER — Encounter: Payer: PPO | Admitting: Occupational Therapy

## 2016-02-26 ENCOUNTER — Telehealth: Payer: Self-pay | Admitting: *Deleted

## 2016-02-26 NOTE — Telephone Encounter (Signed)
Patient called she received a letter in the mail stating it was time for her to schedule her AEX, patient wanted Dr. Sabra Heck to know that she will be seeing her PCP Dr. Kelton Pillar for her yearly exams going forward.

## 2016-02-27 ENCOUNTER — Encounter: Payer: PPO | Admitting: Occupational Therapy

## 2016-02-27 ENCOUNTER — Ambulatory Visit: Payer: PPO | Admitting: Physical Therapy

## 2016-03-03 ENCOUNTER — Ambulatory Visit: Payer: PPO | Admitting: Physical Therapy

## 2016-03-03 ENCOUNTER — Encounter: Payer: PPO | Admitting: Occupational Therapy

## 2016-03-03 DIAGNOSIS — I619 Nontraumatic intracerebral hemorrhage, unspecified: Secondary | ICD-10-CM | POA: Diagnosis not present

## 2016-03-03 DIAGNOSIS — I480 Paroxysmal atrial fibrillation: Secondary | ICD-10-CM | POA: Diagnosis not present

## 2016-03-03 DIAGNOSIS — C50911 Malignant neoplasm of unspecified site of right female breast: Secondary | ICD-10-CM | POA: Diagnosis not present

## 2016-03-03 DIAGNOSIS — E7800 Pure hypercholesterolemia, unspecified: Secondary | ICD-10-CM | POA: Diagnosis not present

## 2016-03-03 DIAGNOSIS — E039 Hypothyroidism, unspecified: Secondary | ICD-10-CM | POA: Diagnosis not present

## 2016-03-03 DIAGNOSIS — F39 Unspecified mood [affective] disorder: Secondary | ICD-10-CM | POA: Diagnosis not present

## 2016-03-05 ENCOUNTER — Encounter: Payer: PPO | Admitting: Occupational Therapy

## 2016-03-05 ENCOUNTER — Ambulatory Visit: Payer: PPO | Admitting: Physical Therapy

## 2016-03-05 ENCOUNTER — Ambulatory Visit (INDEPENDENT_AMBULATORY_CARE_PROVIDER_SITE_OTHER): Payer: PPO | Admitting: Neurology

## 2016-03-05 ENCOUNTER — Other Ambulatory Visit: Payer: Self-pay | Admitting: *Deleted

## 2016-03-05 DIAGNOSIS — G4720 Circadian rhythm sleep disorder, unspecified type: Secondary | ICD-10-CM

## 2016-03-05 DIAGNOSIS — C50411 Malignant neoplasm of upper-outer quadrant of right female breast: Secondary | ICD-10-CM

## 2016-03-05 DIAGNOSIS — G4733 Obstructive sleep apnea (adult) (pediatric): Secondary | ICD-10-CM | POA: Diagnosis not present

## 2016-03-05 MED ORDER — ANASTROZOLE 1 MG PO TABS: 1.0000 mg | ORAL_TABLET | Freq: Every day | ORAL | Status: DC

## 2016-03-06 ENCOUNTER — Telehealth: Payer: Self-pay | Admitting: Genetic Counselor

## 2016-03-06 ENCOUNTER — Encounter: Payer: Self-pay | Admitting: Genetic Counselor

## 2016-03-06 DIAGNOSIS — Z1379 Encounter for other screening for genetic and chromosomal anomalies: Secondary | ICD-10-CM | POA: Insufficient documentation

## 2016-03-06 NOTE — Sleep Study (Signed)
Please see the scanned sleep study interpretation located in the procedure tab in the chart view section.  

## 2016-03-06 NOTE — Telephone Encounter (Signed)
Revealed that the ATM VUS reported out in 2014 has been reclassified as likely benign.  Patient voiced her understanding.

## 2016-03-14 ENCOUNTER — Telehealth: Payer: Self-pay | Admitting: Neurology

## 2016-03-14 DIAGNOSIS — G4733 Obstructive sleep apnea (adult) (pediatric): Secondary | ICD-10-CM

## 2016-03-14 DIAGNOSIS — G4720 Circadian rhythm sleep disorder, unspecified type: Secondary | ICD-10-CM

## 2016-03-14 NOTE — Telephone Encounter (Signed)
Patient referred by Dr. Leta Baptist, seen by me on 02/12/16, diagnostic PSG on 03/05/16, ins: HTA/MCR.   Please call and notify the patient that the recent sleep study did confirm the diagnosis of obstructive sleep apnea and that in light of her medical history I recommend treatment for this in the form of CPAP. This will require a repeat sleep study for proper titration and mask fitting. Please explain to patient and arrange for a CPAP titration study. I have placed an order in the chart. Thanks, and please route to [facility] for scheduling next sleep study.  Star Age, MD, PhD [facility])

## 2016-03-17 NOTE — Progress Notes (Signed)
ID: Barbara [name] OB: October 03, 1951  MR#: [id number]  XTK#:[id number]  PCP: [name], MD GYN:  Felipa Emory SU: Star Age OTHER DJ:MEQAST Blair Heys  CHIEF COMPLAINT: Right Breast Cancer  CURRENT TREATMENT: Anastrozole, zolendronate  BREAST CANCER HISTORY: From the original intake note:  The patient has a very strong family history for breast cancer, although she has tested  negative for the BRCA mutations (as have 2 of her sisters). She had been receiving breast MRI in addition to mammography, but had not had this test performed since October of 2010. More recently, 07/22/2013, routine screening mammography with tomography at [facility] showed no worrisome findings in the setting of extremely dense breasts.  MRI of the breast 09/08/2013, however, showed in the right upper outer quadrant an irregular mass measuring 1.7 cm. This was new as compared to the prior MRI from 2010. There were no abnormal lymph nodes and no other areas of concern in either breast. 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 The MIB-1 was 25%.  The patient's subsequent history is as detailed below  INTERVAL HISTORY: Since her last visit here she had an episode of acute right occipital intracerebral hematoma, as well as an area of subarachnoid bleeding involving the frontal region on the left. Patient is also presented with new onset intermittent atrial fibrillation--that appears to have been transient. In any case she is not felt to be a candidate for anticoagulation.--At this Barbara though Barbara [name] feels she is "back to normal". She went through rehabilitation, is walking normally, has no deficits regarding vision, memory, or motor skills. She completed a sleep apnea study and she has been told that she will  need CPAP but has not yet started that.  REVIEW OF SYSTEMS: Barbara [name] occasionally  Has difficulty finding the word, but this is not different from the way it was a year ago. She exercises at least 30 minutes a day, and does some gardening. She does babysitting occasionally. She denies unusual fatigue, weight loss, rash, fever, or other systemic issues. A detailed review of systems today was stable.  PAST MEDICAL HISTORY: Past Medical History  Diagnosis Date  . Breast cancer ([place])   . Hashimoto's thyroiditis     with goiter  . Hypercholesteremia   . 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  Narrowing of the 4th and 5th lumbar interspaces.   Marland Kitchen Spina bifida ([place])     occutta L5  . Anxiety   . GERD (gastroesophageal reflux disease)   . Headache(784.0)     hx migraines  . Varicose vein   . Stroke [facility]) 11/2015    PAST SURGICAL HISTORY: Past Surgical History  Procedure Laterality Date  . Hernia repair  1976    Left groin  . Tubal ligation    . Cystoscopy with urethral caruncle    . Mastectomy w/ sentinel node biopsy Bilateral 10/13/2013    Procedure:  BILATERAL MASTECTOMY WITH RIGHT SENTINEL LYMPH NODE BIOPSY;  Surgeon: Merrie Roof, MD;  Location: [place];  Service: General;  Laterality: Bilateral;  . Portacath placement Left 10/13/2013    Procedure: INSERTION PORT-A-CATH;  Surgeon: Merrie Roof, MD;  Location: [place];  Service: General;  Laterality: Left;  . Breast reconstruction with placement of  tissue expander and flex hd (acellular hydrated dermis) Bilateral 10/13/2013    Procedure: PLACEMENT OF BILATERAL TISSUE EXPANDER FOR BREAST RECONSTRUCTION ;  Surgeon: Crissie Reese, MD;  Location: [place];  Service: Plastics;  Laterality: Bilateral;  . Wisdom tooth extraction  1990  . Port-a-cath removal Left 12/21/2014    Procedure: MINOR REMOVAL OF PORT-A-CATH;  Surgeon: Autumn Messing III, MD;  Location: [place];  Service:  General;  Laterality: Left;    FAMILY HISTORY Family History  Problem Relation Age of Onset  . Breast cancer Mother 37  . Lung cancer Father   . Cancer Father   . Heart attack Father   . Breast cancer Sister 9    Bilateral breast cancer with 2nd dx at 70  . Breast cancer Maternal Aunt 52  . Breast cancer Sister 47  . Lung cancer Maternal Uncle   . Leukemia Paternal Uncle   . Melanoma Brother 52    on ear  . Prostate cancer Brother 88  . Cancer Cousin     maternal cousin with cancer - NOS  The patient's mother was diagnosed with breast cancer the age of 57. She died at the age of 69. The patient's father died at the age of 107, with a history of lung cancer. The patient had 2 brothers, and 2 sisters. The patient's sister Barbara [name] has a history of bilateral breast cancer in her sister Barbara [name] a history of left breast cancer. Both have been tested and are found to be negative for a BRCA one or 2 mutation. The patient's mother's sister also had a history of breast cancer. The patient herself has been tested for the BRCA mutation as well as through [facility]. No mutation has been found   GYNECOLOGIC HISTORY:  Menarche age 73, first live birth age 50. The patient is GX P2. She stopped having periods in her early 38s. She did not take hormone replacement.  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 Her husband Barbara [name]. Daughter Barbara [name] works as a Music therapist in [place]. Son Barbara [name] is a drug representative for Barbara [name] (diabetes). His wife, and Barbara [name], is a Ship broker in [place] emergency room's. The patient has 5 grandchildren. She at tends [name]    ADVANCED DIRECTIVES:  In place   HEALTH MAINTENANCE:  (Updated 02/10/2014) Social History  Substance Use Topics  . Smoking status: Never Smoker   . Smokeless tobacco: Never Used  . Alcohol Use:  0.0 oz/week    0 Standard drinks or equivalent per week     Comment: Rare     Colonoscopy: 2013/ Barbara [name]  PAP: 2013/Barbara [name]  Bone density: 2013/osteopenia  Lipid panel: Not on file/Dr. Laurann Montana   Allergies  Allergen Reactions  . Buprenex [Buprenorphine] Nausea And Vomiting and Other (See Comments)    Oversedation  . Simvastatin Other (See Comments)    Myalgias   . Codeine Hives, Itching and Rash    Current Outpatient Prescriptions  Medication Sig Dispense Refill  . Acetaminophen (TYLENOL PO) Take by mouth as needed (dosage unspecified).    Marland Kitchen anastrozole (ARIMIDEX) 1 MG tablet Take 1 tablet (1 mg total) by mouth daily. 90 tablet 3  . atorvastatin (LIPITOR) 10 MG tablet Take 10 mg by mouth at bedtime.     Marland Kitchen BIOTIN PO Take 5,000 mcg by mouth daily.     . cholecalciferol 5000 UNITS  TABS Take 1 tablet (5,000 Units total) by mouth daily. 90 tablet 4  . fexofenadine (ALLEGRA) 180 MG tablet Take 180 mg by mouth daily.    . Fluticasone Propionate (FLONASE NA) Place into the nose as needed.    Marland Kitchen levothyroxine (SYNTHROID, LEVOTHROID) 50 MCG tablet Take 50 mcg by mouth daily before breakfast. Take 50 mcg 1 hours before breakfast.    . OMEPRAZOLE PO Take by mouth daily.    Marland Kitchen PARoxetine (PAXIL) 20 MG tablet Take 20 mg by mouth at bedtime.     Marland Kitchen Propylene Glycol (SYSTANE BALANCE) 0.6 % SOLN Apply 1 drop to eye daily as needed (dry eyes).     No current facility-administered medications for this visit.    OBJECTIVE:  Middle-aged white woman in no acute distress Filed Vitals:   03/18/16 0905  BP: 128/74  Pulse: 81  Temp: 99 F (37.2 C)  Resp: 18  Body mass index is 23.37 kg/(m^2).   ECOG:  0 Filed Weights   03/18/16 0905  Weight: 144 lb 11.2 oz (65.635 kg)   Sclerae unicteric, EOMs intact Oropharynx clear, dentition in good repair No cervical or supraclavicular adenopathy Lungs no rales or rhonchi Heart regular rate and rhythm Abd soft, nontender, positive bowel sounds MSK  no focal spinal tenderness, no upper extremity lymphedema Neuro: nonfocal, well oriented, appropriate affect Breasts: s/p bilateral mastectomies with bilateral saline implants in place; there is no evidence of local recurrence; both axillae are benign    LAB RESULTS:   Lab Results  Component Value Date   WBC 8.5 12/26/2015   NEUTROABS 3.6 10/03/2015   HGB 13.9 12/26/2015   HCT 41.9 12/26/2015   HCT 40.3 12/26/2015   MCV 92.7 12/26/2015   PLT 237 12/26/2015      Chemistry      Component Value Date/Barbara   NA 140 12/26/2015 0804   NA 138 10/03/2015 1018   K 4.0 12/26/2015 0804   K 4.7 10/03/2015 1018   CL 107 12/26/2015 0804   CO2 23 12/26/2015 0804   CO2 26 10/03/2015 1018   BUN 18 12/26/2015 0804   BUN 21.4 10/03/2015 1018   CREATININE 0.97 12/26/2015 0804   CREATININE 0.9 10/03/2015 1018      Component Value Date/Barbara   CALCIUM 9.2 12/26/2015 0804   CALCIUM 9.5 10/03/2015 1018   ALKPHOS 57 12/26/2015 0804   ALKPHOS 65 10/03/2015 1018   AST 16 12/26/2015 0804   AST 15 10/03/2015 1018   ALT 14 12/26/2015 0804   ALT 13 10/03/2015 1018   BILITOT 0.3 12/26/2015 0804   BILITOT 0.43 10/03/2015 1018      STUDIES: EXAM: MRI HEAD WITHOUT AND WITH CONTRAST  TECHNIQUE: Multiplanar, multiecho pulse sequences of the brain and surrounding structures were obtained without and with intravenous contrast.  CONTRAST: 73m MULTIHANCE GADOBENATE DIMEGLUMINE 529 MG/ML IV SOLN  COMPARISON: Prior CT from earlier the same day.  FINDINGS: Study somewhat degraded by motion artifact.  Cerebral volume within normal limits for patient age. Patchy T2/FLAIR hyperintensity within the periventricular and deep white matter both cerebral hemispheres most consistent with chronic small vessel ischemic disease, mild in nature.  Previously identified parenchymal hemorrhage again seen within the right occipital lobe. This measures 14 mm on this exam without significant mass  effect. No underlying abnormal enhancement on post gadolinium sequence.  Serpiginous FLAIR hyperintensity with layering within multiple cortical sulci of the left frontal lobe with associated scattered hypointense signal intensity on gradient echo sequence, most consistent  with acute subarachnoid hemorrhage. There is mildly prominent asymmetric leptomeningeal enhancement within this region, likely reactive. Minimal gyral swelling without significant cortical edema or changes to suggest acute infection.  No evidence for acute ischemic infarct. Major intracranial vascular flow voids are maintained. Gray-white matter differentiation otherwise preserved. Few scattered foci of susceptibility artifact within the bilateral cerebral hemispheres present, nonspecific, but like related to small chronic underlying micro hemorrhages.  No mass effect or midline shift. No hydrocephalus. No mass lesion. Other than the associated leptomeningeal enhancement within the left frontal lobe. No other abnormal enhancement identified. Major dural sinuses appear patent.  Craniocervical junction within normal limits. Mild degenerative spondylolysis within the upper cervical spine without significant stenosis.  Pituitary gland normal. No acute abnormality about the orbits.  Retention cyst within the left maxillary sinus. Scattered mucosal thickening and mild opacity within the ethmoidal air cells.  No mastoid effusion. Inner ear structures grossly normal.  Bone marrow signal intensity within normal limits. No scalp soft tissue abnormality.  IMPRESSION: 1. 14 mm acute parenchymal hemorrhage within the right occipital lobe without significant mass effect. No underlying lesion identified. 2. Scattered acute subarachnoid hemorrhage within the left frontal lobe. Etiology of the right occipital and left frontal hemorrhage is uncertain. Primary differential considerations include possible occult  trauma or underlying coagulopathy. Hemorrhage related to underlying hypertension could also be considered. Presence of subarachnoid blood is not typical of underlying amyloid angiopathy. The major dural sinuses appear patent. 3. Scattered chronic micro hemorrhages involving both cerebral hemispheres, nonspecific, but most commonly related to chronic underlying hypertension. 4. Mild chronic small vessel ischemic disease.   Electronically Signed  By: Jeannine Boga M.D.  On: 12/24/2015 23:18   CLINICAL DATA: 65 year old female with acute onset dizziness found have right occipital and trace bilateral subarachnoid hemorrhage. Initial encounter.  EXAM: CT ANGIOGRAPHY HEAD AND NECK  TECHNIQUE: Multidetector CT imaging of the head and neck was performed using the standard protocol during bolus administration of intravenous contrast. Multiplanar CT image reconstructions and MIPs were obtained to evaluate the vascular anatomy. Carotid stenosis measurements (when applicable) are obtained utilizing NASCET criteria, using the distal internal carotid diameter as the denominator.  CONTRAST: 79m OMNIPAQUE IOHEXOL 350 MG/ML SOLN  COMPARISON: Head CT and brain MRI 12/24/2015. Brain MRI 01/16/2015.  FINDINGS: CT HEAD  Brain: Stable small 12-13 mm focus of superior right occipital lobe hemorrhage (series 21, image 18). Subtle left middle frontal gyrus (operculum region) subarachnoid hemorrhage also appears stable. No intraventricular or basilar cistern hemorrhage identified. No intracranial mass effect or ventriculomegaly. Stable gray-white matter differentiation throughout the brain.  Calvarium and skull base: No acute osseous abnormality identified.  Paranasal sinuses: Visualized paranasal sinuses and mastoids are clear.  Orbits: Visualized orbits and scalp soft tissues are within normal limits.  CTA NECK  Skeleton: No acute osseous abnormality  identified. Chronic disc and endplate degeneration in the cervical spine.  Other neck: Incidental breast implants partially visible. Negative lung apices. No superior mediastinal lymphadenopathy. Mild ventral chest wall collateral veins. Left side venous injection, visible left central veins appear patent.  Mild thyromegaly. Subcentimeter thyroid nodules which do not meet consensus criteria for ultrasound follow-up.  Larynx, pharynx, parapharyngeal spaces, retropharyngeal space, sublingual space, submandibular glands, and parotid glands are within normal limits. No cervical lymphadenopathy.  Aortic arch: 3 vessel arch configuration. No arch atherosclerosis or great vessel origin stenosis.  Right carotid system: Minimal atherosclerosis at the right carotid bifurcation. Mildly tortuous proximal right ICA, otherwise negative.  Left carotid system: Mild mostly  calcified atherosclerosis at the lateral left carotid bifurcation. No stenosis. Mildly tortuous cervical left ICA, otherwise negative.  Vertebral arteries:  No proximal subclavian artery stenosis. Normal vertebral artery origins. Tortuous right V1 segment. Tortuous bilateral V2 segments, especially the left. The left vertebral artery is slightly dominant to the skullbase.  CTA HEAD  Posterior circulation: No vertebral artery stenosis. Tortuous vertebrobasilar junction. Normal an dominant appearing AICA origins. No basilar stenosis. Normal SCA origins. Fetal type bilateral PCA origins. Proximal bilateral PCA branches are within normal limits.  There is asymmetric prominence of the distal right PCA (P3 segment) in proximity 2 but not do rec Lee involving the right occipital hemorrhage. This is best demonstrated on series 506, image 13 (long arrow is right PCA and short arrows parenchymal hemorrhage). No definite abnormal draining veins. There was no MRI evidence of a high-flow vascular malformation in this  region.  Anterior circulation: Mildly tortuous bilateral ICA siphons with no atherosclerosis or stenosis. Ophthalmic and posterior communicating artery origins are normal. Normal carotid termini, MCA and ACA origins. Diminutive or absent anterior communicating artery. Bilateral ACA branches are within normal limits. Left MCA M1 segment, bifurcation, and left MCA branches are within normal limits. Right MCA M1 segment, bifurcation, and right MCA branches are within normal limits.  Venous sinuses: Patent.  Anatomic variants: Mildly dominant left vertebral artery.  Delayed phase: No abnormal enhancement identified.  IMPRESSION: 1. Asymmetric size and enhancement of the distal right PCA superior division in proximity to the superior right occipital lobe small intra-axial hemorrhage. This is best demonstrated on series 506, image 13, and raises the possibility of a small vascular malformation. No large or definitely abnormal draining veins, and no recent MRI findings, to suggest a high-flow AVM. Barbara resolved study (conventional cerebral angiogram) should be confirmatory. 2. Otherwise negative for age head and neck CTA; mild cervical carotid atherosclerosis. No stenosis or intracranial aneurysm. 3. Stable small volume right occipital intra-axial and mostly left frontal subarachnoid hemorrhage. No new intracranial abnormality. 4. Mild thyromegaly.   Electronically Signed  By: Genevie Ann M.D.  On: 12/25/2015 12:31       ASSESSMENT: 65 y.o. BRCA negative [place] woman   (1)  status post right breast biopsy 09/21/2013 for a clinical T1c N0, stage IA invasive ductal carcinoma, grade 2, triple positive, with a HER-2: CEP 17 ratio of 5.55 and an average copy number per cell of  13.05. The MIB-1-1 was 25%  (2) status post bilateral mastectomies 10/13/2013 showing:  (a) on the left, atypical ductal hyperplasia  (b) on the right, a pT1c pN0, stage IA invasive ductal  carcinoma, grade 2  (c) completed bilateral saline implant reconstruction 07/28/2014  (3) received paclitaxel/trastuzumab, with paclitaxel given on days 1, 8, and 15 and trastuzumab given every 2 weeks on days 1 and 15 of each 28 day cycle, for 4 cycles (12 doses of paclitaxel) completed 03/24/2014  (4) continued trastuzumab every three weeks through 12/01/2014   (a) echocardiogram 10/02/2014 showed a well preserved ejection fraction  (5) The patient participated in the [facility] study remotely, completing 5 years of tamoxifen in the late 1990s  (6) started anastrozole May 2015  (a) bone density 06/09/2014 showed a T score of -1.6 (decreased from prior)  (b) zolendronate started 03/21/2015, to be repeated yearly   PLAN: RoseHas made an excellent recovery from her neurologic problems and is pretty much back to baseline.  Note of the medications that I am treating her breast cancer with are likely to affect the  neurologic problem.  The plan is to continue anastrozole to a total of 5 years, which is can it take [place] to the spring of 2020. She will receive a second dose of zolendronate today. I reviewed the labs obtained by Dr. Laurann Montana 03/03/2016 and her calcium and creatinine are fine.  We are going to check a bone density in July of this year. I am scheduling her to see me again in one year and we'll repeat zolendronate at that Barbara, possibly for 1 final dose.  If she has some discomfort from the zolendronate, which is very common, she will take Tylenol. If she needs anything stronger than that she will let me know and we can consider tramadol.  She knows to call for any other problems that may develop before her next visit here. Chauncey Cruel, MD   03/18/2016 9:20 AM

## 2016-03-17 NOTE — Telephone Encounter (Signed)
I spoke to patient and she [place] aware of results and recommendations. She would like to come in and discuss sleep study further before scheduling titration study.

## 2016-03-18 ENCOUNTER — Ambulatory Visit: Payer: PPO

## 2016-03-18 ENCOUNTER — Other Ambulatory Visit: Payer: Self-pay | Admitting: *Deleted

## 2016-03-18 ENCOUNTER — Ambulatory Visit (HOSPITAL_BASED_OUTPATIENT_CLINIC_OR_DEPARTMENT_OTHER): Payer: PPO

## 2016-03-18 ENCOUNTER — Encounter: Payer: Self-pay | Admitting: *Deleted

## 2016-03-18 ENCOUNTER — Other Ambulatory Visit: Payer: PPO

## 2016-03-18 ENCOUNTER — Ambulatory Visit (HOSPITAL_BASED_OUTPATIENT_CLINIC_OR_DEPARTMENT_OTHER): Payer: PPO | Admitting: Oncology

## 2016-03-18 ENCOUNTER — Telehealth: Payer: Self-pay | Admitting: Oncology

## 2016-03-18 VITALS — BP 128/74 | HR 81 | Temp 99.0°F | Resp 18 | Ht 66.0 in | Wt 144.7 lb

## 2016-03-18 DIAGNOSIS — M810 Age-related osteoporosis without current pathological fracture: Secondary | ICD-10-CM | POA: Diagnosis not present

## 2016-03-18 DIAGNOSIS — C50411 Malignant neoplasm of upper-outer quadrant of right female breast: Secondary | ICD-10-CM

## 2016-03-18 DIAGNOSIS — M8580 Other specified disorders of bone density and structure, unspecified site: Secondary | ICD-10-CM

## 2016-03-18 DIAGNOSIS — C50019 Malignant neoplasm of nipple and areola, unspecified female breast: Secondary | ICD-10-CM

## 2016-03-18 MED ORDER — SODIUM CHLORIDE 0.9 % IV SOLN
INTRAVENOUS | Status: DC
  Administered 2016-03-18: 10:00:00 via INTRAVENOUS

## 2016-03-18 MED ORDER — ANASTROZOLE 1 MG PO TABS: 1.0000 mg | ORAL_TABLET | Freq: Every day | ORAL | Status: DC

## 2016-03-18 MED ORDER — ZOLEDRONIC ACID 4 MG/100ML IV SOLN
4.0000 mg | Freq: Once | INTRAVENOUS | Status: AC
  Administered 2016-03-18: 4 mg via INTRAVENOUS
  Filled 2016-03-18: qty 100

## 2016-03-18 NOTE — Progress Notes (Signed)
CMET results received from [place] at [facility] per Dr Laurann Montana obtained on 03/03/2016 with creatinine level of 0.8 , calcium of 9.7.  Pt here for zometa post MD visit- noted as canceled on 02/04/2016 by Denman George with reason of " provider ST"- per Dr Jannifer Rodney pt is to receive yearly zometa for bone health.  Treatment able to accommodate pt for infusion today.

## 2016-03-18 NOTE — Patient Instructions (Signed)
Zoledronic Acid injection (Hypercalcemia, Oncology) ([facility]) What is this medicine? ZOLEDRONIC ACID (ZOE le dron ik AS id) lowers the amount of calcium loss from bone. It is used to treat too much calcium in your blood from cancer. It is also used to prevent complications of cancer that has spread to the bone. This medicine may be used for other purposes; ask your health care provider or pharmacist if you have questions. What should I tell my health care provider before I take this medicine? They need to know if you have any of these conditions: -aspirin-sensitive asthma -cancer, especially if you are receiving medicines used to treat cancer -dental disease or wear dentures -infection -kidney disease -receiving corticosteroids like dexamethasone or prednisone -an unusual or allergic reaction to zoledronic acid, other medicines, foods, dyes, or preservatives -pregnant or trying to get pregnant -breast-feeding How should I use this medicine? This medicine is for infusion into a vein. It is given by a health care professional in a hospital or clinic setting. Talk to your pediatrician regarding the use of this medicine in children. Special care may be needed. Overdosage: If you think you have taken too much of this medicine contact a poison control center or emergency room at once. NOTE: This medicine is only for you. Do not share this medicine with others. What if I miss a dose? It is important not to miss your dose. Call your doctor or health care professional if you are unable to keep an appointment. What may interact with this medicine? -certain antibiotics given by injection -NSAIDs, medicines for pain and inflammation, like ibuprofen or naproxen -some diuretics like bumetanide, furosemide -teriparatide -thalidomide This list may not describe all possible interactions. Give your health care provider a list of all the medicines, herbs, non-prescription drugs, or dietary supplements you  use. Also tell them if you smoke, drink alcohol, or use illegal drugs. Some items may interact with your medicine. What should I watch for while using this medicine? Visit your doctor or health care professional for regular checkups. It may be some time before you see the benefit from this medicine. Do not stop taking your medicine unless your doctor tells you to. Your doctor may order blood tests or other tests to see how you are doing. Women should inform their doctor if they wish to become pregnant or think they might be pregnant. There is a potential for serious side effects to an unborn child. Talk to your health care professional or pharmacist for more information. You should make sure that you get enough calcium and vitamin D while you are taking this medicine. Discuss the foods you eat and the vitamins you take with your health care professional. Some people who take this medicine have severe bone, joint, and/or muscle pain. This medicine may also increase your risk for jaw problems or a broken thigh bone. Tell your doctor right away if you have severe pain in your jaw, bones, joints, or muscles. Tell your doctor if you have any pain that does not go away or that gets worse. Tell your dentist and dental surgeon that you are taking this medicine. You should not have major dental surgery while on this medicine. See your dentist to have a dental exam and fix any dental problems before starting this medicine. Take good care of your teeth while on this medicine. Make sure you see your dentist for regular follow-up appointments. What side effects may I notice from receiving this medicine? Side effects that you should report   to your doctor or health care professional as soon as possible: -allergic reactions like skin rash, itching or hives, swelling of the face, lips, or tongue -anxiety, confusion, or depression -breathing problems -changes in vision -eye pain -feeling faint or lightheaded,  falls -jaw pain, especially after dental work -mouth sores -muscle cramps, stiffness, or weakness -redness, blistering, peeling or loosening of the skin, including inside the mouth -trouble passing urine or change in the amount of urine Side effects that usually do not require medical attention (report to your doctor or health care professional if they continue or are bothersome): -bone, joint, or muscle pain -constipation -diarrhea -fever -hair loss -irritation at site where injected -loss of appetite -nausea, vomiting -stomach upset -trouble sleeping -trouble swallowing -weak or tired This list may not describe all possible side effects. Call your doctor for medical advice about side effects. You may report side effects to FDA at 1-800-FDA-1088. Where should I keep my medicine? This drug is given in a hospital or clinic and will not be stored at home. NOTE: This sheet is a summary. It may not cover all possible information. If you have questions about this medicine, talk to your doctor, pharmacist, or health care provider.    2016, Elsevier/Gold Standard. (2014-04-08 14:19:39)  

## 2016-03-18 NOTE — Telephone Encounter (Signed)
appt made and avs printed °

## 2016-03-19 ENCOUNTER — Encounter: Payer: Self-pay | Admitting: Neurology

## 2016-03-19 ENCOUNTER — Ambulatory Visit (INDEPENDENT_AMBULATORY_CARE_PROVIDER_SITE_OTHER): Payer: PPO | Admitting: Neurology

## 2016-03-19 VITALS — BP 122/68 | HR 76 | Resp 16 | Ht 66.0 in | Wt 143.0 lb

## 2016-03-19 DIAGNOSIS — I619 Nontraumatic intracerebral hemorrhage, unspecified: Secondary | ICD-10-CM | POA: Diagnosis not present

## 2016-03-19 DIAGNOSIS — I4891 Unspecified atrial fibrillation: Secondary | ICD-10-CM | POA: Diagnosis not present

## 2016-03-19 DIAGNOSIS — G4733 Obstructive sleep apnea (adult) (pediatric): Secondary | ICD-10-CM

## 2016-03-19 DIAGNOSIS — I609 Nontraumatic subarachnoid hemorrhage, unspecified: Secondary | ICD-10-CM

## 2016-03-19 NOTE — Patient Instructions (Addendum)
As discussed, we will bring you back for your second sleep study with CPAP to treat your sleep apnea.   We will then make a follow up appointment after the next sleep study.

## 2016-03-19 NOTE — Progress Notes (Signed)
Subjective:    Patient ID: Barbara [name] is a 65 y.o. female.  HPI     Interim history:   Barbara [name] is a 65 year old right-handed woman with an underlying medical history of spina bifida, anxiety, reflux disease, migraine headaches, breast cancer, Hashimoto thyroiditis with history of goiter, hyperlipidemia, memory loss, cerebral amyloid angiopathy with history of hemorrhagic stroke, with intraocular cerebral hemorrhage and subarachnoid hemorrhage in January 2017, with new diagnosis of paroxysmal A. fib, who presents for follow-up consultation of her sleep disorder, in particular her sleep apnea, after her recent baseline sleep study. The patient is accompanied by her husband today. I first met her on 02/12/2016 at the request of Dr. Leta Baptist, at which time the patient reported snoring and excessive daytime somnolence as well as witnessed apneic breathing pauses. She was invited for sleep study. She had a baseline sleep study on 03/05/2016. I went over her test results with her in detail today. Sleep efficiency was 85.7% with a prolonged sleep latency of 33.5 minutes and wake after sleep onset of 33.5 minutes with one longer period of wakefulness, otherwise minimal sleep fragmentation noted. She had an elevated arousal index. She had an increased percentage of stage II sleep, absence of slow-wave sleep and a decreased percentage of REM sleep at 9.8% with a markedly prolonged REM latency of 330 minutes. She had no significant PLMS, EKG or EEG changes. She had mild intermittent snoring. Total AHI was 7.8 per hour, rising to 15.9 per hour in the supine position. Average oxygen saturation was 93%, nadir was 83%. Time below 88% saturation was 8 minutes and 44 seconds. I suggested that the patient return for a second sleep study for CPAP titration. She requested a follow-up appointment to discuss further.   Today, 03/19/2016: She reports feeling stable. She has no new complaints. She is trying to  exercise. She hydrates well with water. She likes to do yard work. Sleep-wise, she felt she had slept well during the first sleep study and would be willing to try CPAP therapy. She has had no new neurological symptoms. Thankfully, she is making progress with her speech as well. She does do some of the exercises she was shown by physical therapy as well.   Previously:   02/12/2016: She reports snoring, and EDS. Her husband has noted apneic pauses in her sleep. Her daughter-in-law, who is a Designer, jewellery has also witnessed apneic pauses in her sleep. She has nocturia about once a night. She has some aching in her thighs since she started atorvastatin. She has not seen a cardiologist for her paroxysmal A. fib. She has seen her primary care physician after her hospital discharge and had an EKG. Bedtime is around 9 PM, sometimes it takes her 30-60 minutes to fall asleep but she does not take anything for sleep. Right time is between 7:30 and 8:30. Epworth sleepiness score is 2 out of 24, her fatigue score is 9 out of 63. She is somewhat tired during the day at times. She used to have morning headaches in the past but nothing recently. She denies actual restless legs type symptoms but has to sleep with a pillow between her legs. She also has a history of scoliosis which makes her uncomfortable sleeping on her back, she is a side sleeper. Her son has obstructive sleep apnea. She lives with her husband. 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 She drinks alcohol very rarely, caffeine about half a cap of coffee, nonsmoker. She  has 2 grown children.  They do not have any pets. They do not watch TV in bed.    I reviewed your office note from 01/29/2016. She had a head CT without contrast on 12/24/2015: IMPRESSION: 1. Focus of acute hemorrhage in the right occipital lobe near the gray-white junction. This could reflect a hemorrhagic metastasis or hemorrhage from amyloid angiopathy. There is no  history of trauma or anti coagulation. 2. Possible cortical thickening in the left frontal lobe suggesting possible cerebritis. 3. MRI without and with contrast recommended for further evaluation. 4. Critical Value/emergent results were called by telephone at the time of interpretation on 12/24/2015 at 9:40 pm to Dr. Virgel Manifold, who verbally acknowledged these results.  In addition, personally reviewed the images through the PACS system.   She had a brain MRI with and without contrast on 12/24/2015: IMPRESSION: 1. 14 mm acute parenchymal hemorrhage within the right occipital lobe without significant mass effect. No underlying lesion identified. 2. Scattered acute subarachnoid hemorrhage within the left frontal lobe. Etiology of the right occipital and left frontal hemorrhage is uncertain. Primary differential considerations include possible occult trauma or underlying coagulopathy. Hemorrhage related to underlying hypertension could also be considered. Presence of subarachnoid blood is not typical of underlying amyloid angiopathy. The major dural sinuses appear patent. 3. Scattered chronic micro hemorrhages involving both cerebral hemispheres, nonspecific, but most commonly related to chronic underlying hypertension. 4. Mild chronic small vessel ischemic disease. In addition, I personally reviewed the images through the PACS system.  Her Past Medical History Is Significant For: Past Medical History  Diagnosis Date  . Breast cancer ([place])   . Hashimoto's thyroiditis     with goiter  . Hypercholesteremia   . 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  Narrowing of the 4th and 5th lumbar interspaces.   Marland Kitchen Spina bifida ([place])     [place] L5  . Anxiety   . GERD (gastroesophageal reflux disease)   . Headache(784.0)     hx migraines  . Varicose vein   . Stroke [facility]) 11/2015    Her Past Surgical History Is Significant For: Past  Surgical History  Procedure Laterality Date  . Hernia repair  1976    Left groin  . Tubal ligation    . Cystoscopy with urethral caruncle    . Mastectomy w/ sentinel node biopsy Bilateral 10/13/2013    Procedure:  BILATERAL MASTECTOMY WITH RIGHT SENTINEL LYMPH NODE BIOPSY;  Surgeon: Merrie Roof, MD;  Location: [place];  Service: General;  Laterality: Bilateral;  . Portacath placement Left 10/13/2013    Procedure: INSERTION PORT-A-CATH;  Surgeon: Merrie Roof, MD;  Location: [place];  Service: General;  Laterality: Left;  . Breast reconstruction with placement of tissue expander and flex hd (acellular hydrated dermis) Bilateral 10/13/2013    Procedure: PLACEMENT OF BILATERAL TISSUE EXPANDER FOR BREAST RECONSTRUCTION ;  Surgeon: Crissie Reese, MD;  Location: [place];  Service: Plastics;  Laterality: Bilateral;  . Wisdom tooth extraction  1990  . Port-a-cath removal Left 12/21/2014    Procedure: MINOR REMOVAL OF PORT-A-CATH;  Surgeon: Autumn Messing III, MD;  Location: [place];  Service: General;  Laterality: Left;    Her Family History Is Significant For: Family History  Problem Relation Age of Onset  . Breast cancer Mother 31  . Lung cancer Father   . Cancer Father   . Heart attack Father   .  Breast cancer Sister 31    Bilateral breast cancer with 2nd dx at 22  . Breast cancer Maternal Aunt 52  . Breast cancer Sister 69  . Lung cancer Maternal Uncle   . Leukemia Paternal Uncle   . Melanoma Brother 52    on ear  . Prostate cancer Brother 15  . Cancer Cousin     maternal cousin with cancer - NOS    Her Social History Is Significant For: Social History   Social History  . Marital Status: Married    Spouse Name: Percell Miller  . Number of Children: 2  . Years of Education: LPN   Occupational History  . Retired     [employer].   Social History Main Topics  . Smoking status: Never Smoker   . Smokeless tobacco: Never Used  . Alcohol Use: 0.0 oz/week    0  Standard drinks or equivalent per week     Comment: Rare  . Drug Use: No  . Sexual Activity: No     Comment: BTL   Other Topics Concern  . None   Social History Narrative   Patient lives at home with her spouse.   Caffeine Use:0.5-1 cup of coffee in the a.m.    Her Allergies Are:  Allergies  Allergen Reactions  . Buprenex [Buprenorphine] Nausea And Vomiting and Other (See Comments)    Oversedation  . Simvastatin Other (See Comments)    Myalgias   . Codeine Hives, Itching and Rash  :   Her Current Medications Are:  Outpatient Encounter Prescriptions as of 03/19/2016  Medication Sig  . Acetaminophen (TYLENOL PO) Take by mouth as needed (dosage unspecified).  Marland Kitchen anastrozole (ARIMIDEX) 1 MG tablet Take 1 tablet (1 mg total) by mouth daily.  Marland Kitchen atorvastatin (LIPITOR) 10 MG tablet Take 10 mg by mouth at bedtime.   Marland Kitchen BIOTIN PO Take 5,000 mcg by mouth every other day.   . cholecalciferol 5000 UNITS TABS Take 1 tablet (5,000 Units total) by mouth daily.  . fexofenadine (ALLEGRA) 180 MG tablet Take 180 mg by mouth as needed.   . Fluticasone Propionate (FLONASE NA) Place into the nose as needed.  Marland Kitchen levothyroxine (SYNTHROID, LEVOTHROID) 50 MCG tablet Take 50 mcg by mouth daily before breakfast. Take 50 mcg 1 hours before breakfast.  . OMEPRAZOLE PO Take by mouth daily.  Marland Kitchen PARoxetine (PAXIL) 20 MG tablet Take 20 mg by mouth at bedtime.   Marland Kitchen Propylene Glycol (SYSTANE BALANCE) 0.6 % SOLN Apply 1 drop to eye daily as needed (dry eyes).   No facility-administered encounter medications on file as of 03/19/2016.  :  Review of Systems:  Out of a complete 14 point review of systems, all are reviewed and negative with the exception of these symptoms as listed below:   Review of Systems  Neurological:       Patient has received her sleep study results. She requested to come in and discuss in more detail. Also discuss treatment option.     Objective:  Neurologic Exam  Physical Exam Physical  Examination:   Filed Vitals:   03/19/16 1603  BP: 122/68  Pulse: 76  Resp: 16   General Examination: The patient is a very pleasant 65 y.o. female in no acute distress. She appears well-developed and well-nourished and well groomed. She is in good spirits today.  HEENT: Normocephalic, atraumatic, pupils are equal, round and reactive to light and accommodation. She has bilateral cataracts. Extraocular tracking is good without limitation to gaze  excursion or nystagmus noted. Normal smooth pursuit is noted. Hearing is grossly intact. Face is symmetric with normal facial animation and normal facial sensation. Speech is mildly dysarthric, stable. There is no hypophonia. There is no lip, neck/head, jaw or voice tremor. Neck is supple with full range of passive and active motion. There are no carotid bruits on auscultation. Oropharynx exam reveals: mild mouth dryness, adequate dental hygiene and mild airway crowding, due to redundant soft palate and smaller airway entry. Mallampati is class I. Tongue protrudes centrally and palate elevates symmetrically. Tonsismall bilaterally.     Chest: Clear to auscultation without wheezing, rhonchi or crackles noted.  Heart: S1+S2+0, regular and normal without murmurs, rubs or gallops noted.   Abdomen: Soft, non-tender and non-distended with normal bowel sounds appreciated on auscultation.  Extremities: There is no pitting edema in the distal lower extremities bilaterally.    Skin: Warm and dry without trophic changes noted.   Musculoskeletal: exam reveals no obvious joint deformities, tenderness or joint swelling or erythema, with the exception of mild scoliosis.   Neurologically:  Mental status: The patient is awake, alert and oriented in all 4 spheres. Her immediate and remote memory, attention, language skills and fund of knowledge are very mildly impaired, she has mild slowness in thinking and mild word finding difficulties, mild dysarthria, stable.  Thought process is linear. Mood is normal and affect is normal.  Cranial nerves II - XII are as described above under HEENT exam. In addition: shoulder shrug is normal with equal shoulder height noted. Motor exam: Normal bulk, strength and tone is noted. There is no drift, tremor or rebound. Romberg is negative. Reflexes are 2+ to 3+ throughout. Fine motor skills and coordination: very mildly slow. Very mild difficulty with heel to shin.   Sensory exam: intact to light touch in the upper and lower extremities.  Gait, station and balance: She stands easily. No veering to one side is noted. No leaning to one side is noted. Posture is age-appropriate and stance is narrow based. Gait shows normal stride length and normal pace. No problems turning are noted. She turns slightly slowly.   Assessment and Plan:  In summary, RUNELL KOVICH is a very pleasant 66 year old female with an underlying medical history of spina bifida, anxiety, reflux disease, migraine headaches, breast cancer, Hashimoto thyroiditis with history of goiter, hyperlipidemia, memory loss, cerebral amyloid angiopathy with history of hemorrhagic stroke, with intraocular cerebral hemorrhage and subarachnoid hemorrhage in January 2017, with new diagnosis of paroxysmal A. fib, who presents for follow up consultation after her recent sleep study. She had a baseline sleep study on 03/05/2016 and we talked about the test results. Essentially, she has mild to moderate obstructive sleep apnea. Given her medical history and especially her stroke history and A. Fib history, she is advised to consider CPAP therapy. She is amenable to this. To that end, we will bring her back for a full night CPAP titration study. I will see her back after that. Hopefully, if she does well with CPAP, I will prescribe a home CPAP machine for her and see her in follow-up for sleep apnea. Her physical exam and neurological exam are stable. She has not had any episodes of A. Fib  from what I understand. She has a follow-up with Dr. Leta Baptist coming up in June and just yesterday had a follow-up with oncology and had her osteoporosis medication infusion. I explained again the risks and ramifications of OSA, especially with respect to developing cardiovascular disease  down the Road, including congestive heart failure, difficult to treat hypertension, cardiac arrhythmias, or stroke. Even type 2 diabetes has, in part, been linked to untreated OSA.  I again explained the CPAP treatment option to the patient, who indicated that she would be willing to use CPAP. I answered all their questions today and the patient and her husband were in agreement. I spent 25 minutes in total face-to-face time with the patient, more than 50% of which was spent in counseling and coordination of care, reviewing test results, reviewing medication and discussing or reviewing the diagnosis of OSA, its prognosis and treatment options.

## 2016-04-04 ENCOUNTER — Telehealth: Payer: Self-pay | Admitting: *Deleted

## 2016-04-04 NOTE — Telephone Encounter (Signed)
Per linda in sleep lab, will call pt.

## 2016-04-07 DIAGNOSIS — M62838 Other muscle spasm: Secondary | ICD-10-CM | POA: Diagnosis not present

## 2016-04-07 DIAGNOSIS — M792 Neuralgia and neuritis, unspecified: Secondary | ICD-10-CM | POA: Diagnosis not present

## 2016-04-09 ENCOUNTER — Ambulatory Visit: Payer: PPO | Attending: Neurology

## 2016-04-09 DIAGNOSIS — R252 Cramp and spasm: Secondary | ICD-10-CM | POA: Insufficient documentation

## 2016-04-09 DIAGNOSIS — M542 Cervicalgia: Secondary | ICD-10-CM | POA: Diagnosis not present

## 2016-04-09 NOTE — Patient Instructions (Signed)
From cabinet snags with hand   4-1-2 sets of 5 ,5x/day

## 2016-04-09 NOTE — Therapy (Signed)
[place], [place], [address] Phone: [phone]   Fax:  [phone]  Physical Therapy Evaluation  Patient Details  Name: Barbara [name] MRN: [id number] Date of Birth: 08/16/1951 Referring Provider: Gaynelle Arabian, MD  Encounter Date: 04/09/2016      PT End of Session - 04/09/16 1037    Visit Number 1   Number of Visits 16   Date for PT Re-Evaluation 05/24/16   Authorization Type [employer]/Medicare   Authorization Time Period G codes every 10th visit   PT Start Time 0930   PT Stop Time 1032   PT Time Calculation (min) 62 min   Activity Tolerance Patient tolerated treatment well   Behavior During Therapy [facility] for tasks assessed/performed      Past Medical History  Diagnosis Date  . Breast cancer ([place])   . Hashimoto's thyroiditis     with goiter  . Hypercholesteremia   . 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  Narrowing of the 4th and 5th lumbar interspaces.   Marland Kitchen Spina bifida ([place])     occutta L5  . Anxiety   . GERD (gastroesophageal reflux disease)   . Headache(784.0)     hx migraines  . Varicose vein   . Stroke [facility]) 11/2015    Past Surgical History  Procedure Laterality Date  . Hernia repair  1976    Left groin  . Tubal ligation    . Cystoscopy with urethral caruncle    . Mastectomy w/ sentinel node biopsy Bilateral 10/13/2013    Procedure:  BILATERAL MASTECTOMY WITH RIGHT SENTINEL LYMPH NODE BIOPSY;  Surgeon: Merrie Roof, MD;  Location: [place];  Service: General;  Laterality: Bilateral;  . Portacath placement Left 10/13/2013    Procedure: INSERTION PORT-A-CATH;  Surgeon: Merrie Roof, MD;  Location: [place];  Service: General;  Laterality: Left;  . Breast reconstruction with placement of tissue expander and flex hd (acellular hydrated dermis) Bilateral 10/13/2013    Procedure: PLACEMENT OF BILATERAL TISSUE EXPANDER FOR  BREAST RECONSTRUCTION ;  Surgeon: Crissie Reese, MD;  Location: [place];  Service: Plastics;  Laterality: Bilateral;  . Wisdom tooth extraction  1990  . Port-a-cath removal Left 12/21/2014    Procedure: MINOR REMOVAL OF PORT-A-CATH;  Surgeon: Autumn Messing III, MD;  Location: [place];  Service: General;  Laterality: Left;    There were no vitals filed for this visit.       Subjective Assessment - 04/09/16 0937    Subjective She reports pain in LT scapula for 2 weeks  that has been worsening over last week after cleaning garage with pain into Lt arm and tingle  lateral 2 digits. She feels pain is severe. Medications helping. Turnign and tilting to LT difficult   Limitations --  Not gardening,   sleeping disturbed ,  she is doing laundry and home tasks and independent with self care but has pain.    How long can you sit comfortably? As needed   How long can you stand comfortably? Not specific.    How long can you walk comfortably? As needed    Diagnostic tests None   Patient Stated Goals She wants pain to go away.    Currently in Pain? Yes   Pain Score 8    Pain Location Neck   Pain Orientation Left;Posterior   Pain Descriptors / Indicators Constant  sharp  Pain Type Acute pain   Pain Radiating Towards LT arm to hand   Pain Onset 1 to 4 weeks ago   Pain Frequency Constant   Aggravating Factors  reaching , sitting back , lying   Pain Relieving Factors LT arm rest on head eases pain., medication    Multiple Pain Sites No            [facility] PT Assessment - 04/09/16 0950    Assessment   Medical Diagnosis muscle spasm, neck [place] Link pain   Referring Provider Gaynelle Arabian, MD   Onset Date/Surgical Date --  2 weeks ago   Hand Dominance Right   Next MD Visit As needed   Prior Therapy PT for post CVA issues   Precautions   Precautions None   Restrictions   Weight Bearing Restrictions No   Balance Screen   Has the patient fallen in the past 6 months No   Has the  patient had a decrease in activity level because of a fear of falling?  No   Is the patient reluctant to leave their home because of a fear of falling?  No   Home Environment   Living Environment Private residence   Living Arrangements Spouse/significant other   Available Help at Discharge Family   Type of [place] to enter   Entrance Stairs-Number of Steps 1   Entrance Stairs-Rails None   Home Layout Two level   Alternate Level Stairs-Number of Steps 12   Alternate Level Stairs-Rails Left   Home Equipment None   Prior Function   Level of Independence --  Getting asssit as needed from spouse due to pain .   Cognition   Overall Cognitive Status Within Functional Limits for tasks assessed  appears anxious   Posture/Postural Control   Posture Comments Good sitting psoture   ROM / Strength   AROM / PROM / Strength AROM;Strength   AROM   Overall AROM Comments both shoulder WFL with some decr IR RT shoulder   AROM Assessment Site Cervical   Cervical Flexion 62   Cervical Extension 60   Cervical - Right Side Bend 40   Cervical - Left Side Bend 20   Cervical - Right Rotation 50   Cervical - Left Rotation 40   Strength   Overall Strength Comments Normal strength both UE     Ambulation/Gait   Gait Comments WNL                   [facility] Adult PT Treatment/Exercise - 04/09/16 0950    Modalities   Modalities Electrical Stimulation;Moist Heat   Moist Heat Therapy   Number Minutes Moist Heat 15 Minutes   Moist Heat Location Cervical   Manual Therapy   Manual therapy comments Mobs with sustained pressure LT to RT mid cervical spine 3 sets of 5 reps and pt instructed to do same at t home                PT Education - 04/09/16 1027    Education provided Yes   Education Details POC, Mobs with movement for LT Rotation   Person(s) Educated Patient   Methods Explanation;Demonstration;Tactile cues;Verbal cues;Handout   Comprehension Returned  demonstration;Verbalized understanding          PT Short Term Goals - 04/09/16 1044    PT SHORT TERM GOAL #1   Title she will be independent with initial HEP   Time 4   Period Weeks  Status New   PT SHORT TERM GOAL #2   Title She will report pain decr 25% or more    Time 4   Period Weeks   Status New   PT SHORT TERM GOAL #3   Title She will improve cervical rotation to 60 degree sor more    Time 4   Period Weeks   Status New   PT SHORT TERM GOAL #4   Title She will be able to side bend to 30 degrees to LT    Time 4   Period Weeks   Status New           PT Long Term Goals - 04/09/16 1225    PT LONG TERM GOAL #1   Title She will be able to demo all HEP issued   Time 6   Period Weeks   Status New   PT LONG TERM GOAL #2   Title She will report pain as intermitant  but not limiting activity   Time 6   Period Weeks   Status New   PT LONG TERM GOAL #3   Title She will have cervical ROM equal RT to LT with 1/10 max pain.    Time 6   Period Weeks   Status New   PT LONG TERM GOAL #4   Title she will return to gardening with 1/10 max pain intermittantly   Time 6   Period Weeks   Status New   PT LONG TERM GOAL #5   Title she will be able to sleep in bed 6-7 hours without increased pain   Time 6   Period Weeks   Status New               Plan - 04/09/16 1039    Clinical Impression Statement She presents with low level evaluation with pain and spasm, stiffness of neck, limiting normal activity including sleep/ unable to lye down.   She should improve with PT but id not in 7-8 visits will have her return to MD/.    Rehab Potential Good   PT Frequency 2x / week   PT Duration 6 weeks   PT Treatment/Interventions Electrical Stimulation;Moist Heat;Iontophoresis 4mg /ml Dexamethasone;Ultrasound;Traction;Therapeutic exercise;Manual techniques;Patient/family education;Dry needling;Passive range of motion;Taping   PT Next Visit Plan Modalities , manual, stretching     PT Home Exercise Plan mobs with LT rotation .   Consulted and Agree with Plan of Care Patient      Patient will benefit from skilled therapeutic intervention in order to improve the following deficits and impairments:  Decreased range of motion, Pain, Increased muscle spasms, Decreased activity tolerance  Visit Diagnosis: Cervicalgia - Plan: PT plan of care cert/re-cert  Cramp and spasm - Plan: PT plan of care cert/re-cert      G-Codes - [id number]    Functional Assessment Tool Used FOTO 61% limites   Functional Limitation Changing and maintaining body position   Changing and Maintaining Body Position Current Status [id number]) At least 40 percent but less than 60 percent impaired, limited or restricted   Changing and Maintaining Body Position Goal Status [id number]) At least 20 percent but less than 40 percent impaired, limited or restricted       Problem List Patient Active Problem List   Diagnosis Date Noted  . Genetic testing 03/06/2016  . Hemorrhagic stroke ([place]) 01/29/2016  . SAH (subarachnoid hemorrhage) ([place]) 01/29/2016  . Memory loss 01/29/2016  . Paroxysmal atrial fibrillation ([place]) 01/29/2016  . Cerebral amyloid angiopathy  01/29/2016  . ICH (intracerebral hemorrhage) ([place]) - R occipital 12/27/2015  . Atrial fibrillation with RVR ([place]) 12/27/2015  . Hyperlipidemia 12/27/2015  . Cognitive deficits 12/27/2015  . Hypothyroidism 12/27/2015  . Anxiety 12/27/2015  . Subarachnoid hemorrhage ([place]) - L frontal 12/25/2015  . Cerebellar stroke ([place]) 12/02/2014  . Osteopenia 12/01/2014  . Rhinitis, chronic 01/13/2014  . Breast cancer of upper-outer quadrant of right female breast ([place]) 09/26/2013    Darrel Hoover   PT 04/09/2016, 12:30 PM  [place], [place], [address] Phone: [phone]   Fax:  [phone]  Name: THAMARA MILWARD MRN: [id number] Date of Birth: [birth year]

## 2016-04-11 ENCOUNTER — Other Ambulatory Visit: Payer: Self-pay | Admitting: Family Medicine

## 2016-04-11 ENCOUNTER — Ambulatory Visit
Admission: RE | Admit: 2016-04-11 | Discharge: 2016-04-11 | Disposition: A | Discharge status: Home / Self Care | Payer: PPO | Source: Ambulatory Visit | Attending: Family Medicine | Admitting: Family Medicine

## 2016-04-11 DIAGNOSIS — M549 Dorsalgia, unspecified: Secondary | ICD-10-CM

## 2016-04-11 DIAGNOSIS — M5030 Other cervical disc degeneration, unspecified cervical region: Secondary | ICD-10-CM | POA: Diagnosis not present

## 2016-04-11 DIAGNOSIS — M25512 Pain in left shoulder: Secondary | ICD-10-CM | POA: Diagnosis not present

## 2016-04-11 DIAGNOSIS — M25511 Pain in right shoulder: Secondary | ICD-10-CM

## 2016-04-16 ENCOUNTER — Ambulatory Visit: Payer: PPO | Admitting: Physical Therapy

## 2016-04-16 DIAGNOSIS — R252 Cramp and spasm: Secondary | ICD-10-CM

## 2016-04-16 DIAGNOSIS — M542 Cervicalgia: Secondary | ICD-10-CM

## 2016-04-16 NOTE — Therapy (Signed)
[place], [place], [address] Phone: [phone]   Fax:  [phone]  Physical Therapy Treatment  Patient Details  Name: Barbara [name] MRN: [id number] Date of Birth: 17-Sep-1951 Referring Provider: Gaynelle Arabian, MD  Encounter Date: 04/16/2016      PT End of Session - 04/16/16 1159    Visit Number 2   Number of Visits 16   Date for PT Re-Evaluation 05/24/16   Authorization Type [employer]/Medicare   Authorization Time Period G codes every 10th visit   PT Start Time 1145   PT Stop Time 1230   PT Time Calculation (min) 45 min      Past Medical History  Diagnosis Date  . Breast cancer ([place])   . Hashimoto's thyroiditis     with goiter  . Hypercholesteremia   . 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  Narrowing of the 4th and 5th lumbar interspaces.   Marland Kitchen Spina bifida ([place])     occutta L5  . Anxiety   . GERD (gastroesophageal reflux disease)   . Headache(784.0)     hx migraines  . Varicose vein   . Stroke [facility]) 11/2015    Past Surgical History  Procedure Laterality Date  . Hernia repair  1976    Left groin  . Tubal ligation    . Cystoscopy with urethral caruncle    . Mastectomy w/ sentinel node biopsy Bilateral 10/13/2013    Procedure:  BILATERAL MASTECTOMY WITH RIGHT SENTINEL LYMPH NODE BIOPSY;  Surgeon: Merrie Roof, MD;  Location: [place];  Service: General;  Laterality: Bilateral;  . Portacath placement Left 10/13/2013    Procedure: INSERTION PORT-A-CATH;  Surgeon: Merrie Roof, MD;  Location: [place];  Service: General;  Laterality: Left;  . Breast reconstruction with placement of tissue expander and flex hd (acellular hydrated dermis) Bilateral 10/13/2013    Procedure: PLACEMENT OF BILATERAL TISSUE EXPANDER FOR BREAST RECONSTRUCTION ;  Surgeon: Crissie Reese, MD;  Location: [place];  Service: Plastics;  Laterality: Bilateral;  .  Wisdom tooth extraction  1990  . Port-a-cath removal Left 12/21/2014    Procedure: MINOR REMOVAL OF PORT-A-CATH;  Surgeon: Autumn Messing III, MD;  Location: [place];  Service: General;  Laterality: Left;    There were no vitals filed for this visit.      Subjective Assessment - 04/16/16 1148    Subjective I called MD and requested an xray. I had the xray last Friday. I got the results yesterday. I heard the word arthritis. They started me on steroids.    Currently in Pain? Yes   Pain Score 2    Pain Location Neck   Pain Orientation Left;Posterior            [facility] PT Assessment - 04/16/16 0001    AROM   Cervical - Right Side Bend 50   Cervical - Left Side Bend 45   Cervical - Right Rotation 70   Cervical - Left Rotation 60                     [facility] Adult PT Treatment/Exercise - 04/16/16 0001    Self-Care   Self-Care Other Self-Care Comments   Other Self-Care Comments  Instructed pt in quadruped for gardening so that she can maintain neutral spine, proper postrue and neutral spine, importance of maintining strength and flexibility for prevention  of sx   Neck Exercises: Seated   Neck Retraction 10 reps   Other Seated Exercise scap squeezes x 10   Neck Exercises: Stretches   Upper Trapezius Stretch 3 reps;20 seconds   Levator Stretch 3 reps;20 seconds                PT Education - 04/16/16 1223    Education provided Yes   Education Details Neck Tension   Person(s) Educated Patient   Methods Explanation;Handout   Comprehension Verbalized understanding          PT Short Term Goals - 04/16/16 1306    PT SHORT TERM GOAL #1   Title she will be independent with initial HEP   Time 4   Period Weeks   Status On-going   PT SHORT TERM GOAL #2   Title She will report pain decr 25% or more    Time 4   Period Weeks   Status Achieved   PT SHORT TERM GOAL #3   Title She will improve cervical rotation to 60 degree sor more    Time 4    Period Weeks   Status Achieved   PT SHORT TERM GOAL #4   Title She will be able to side bend to 30 degrees to LT    Time 4   Period Weeks   Status Achieved           PT Long Term Goals - 04/09/16 1225    PT LONG TERM GOAL #1   Title She will be able to demo all HEP issued   Time 6   Period Weeks   Status New   PT LONG TERM GOAL #2   Title She will report pain as intermitant  but not limiting activity   Time 6   Period Weeks   Status New   PT LONG TERM GOAL #3   Title She will have cervical ROM equal RT to LT with 1/10 max pain.    Time 6   Period Weeks   Status New   PT LONG TERM GOAL #4   Title she will return to gardening with 1/10 max pain intermittantly   Time 6   Period Weeks   Status New   PT LONG TERM GOAL #5   Title she will be able to sleep in bed 6-7 hours without increased pain   Time 6   Period Weeks   Status New               Plan - 04/16/16 1209    Clinical Impression Statement Pt reports significant improvement after starting steroid meds. She reports her xray showed arthritis. Her AROM has improved however still unequal right vs left for rotation. Pt given cervical stretches and postural exercises for HEP. Most STGs Met.    PT Next Visit Plan review HEP, ad cervical/ scap stab, check for trigger points      Patient will benefit from skilled therapeutic intervention in order to improve the following deficits and impairments:  Decreased range of motion, Pain, Increased muscle spasms, Decreased activity tolerance  Visit Diagnosis: Cervicalgia  Cramp and spasm     Problem List Patient Active Problem List   Diagnosis Date Noted  . Genetic testing 03/06/2016  . Hemorrhagic stroke ([place]) 01/29/2016  . SAH (subarachnoid hemorrhage) ([place]) 01/29/2016  . Memory loss 01/29/2016  . Paroxysmal atrial fibrillation ([place]) 01/29/2016  . Cerebral amyloid angiopathy 01/29/2016  . ICH (intracerebral hemorrhage) ([place]) - R occipital 12/27/2015  .  Atrial fibrillation with RVR ([place]) 12/27/2015  . Hyperlipidemia 12/27/2015  . Cognitive deficits 12/27/2015  . Hypothyroidism 12/27/2015  . Anxiety 12/27/2015  . Subarachnoid hemorrhage ([place]) - L frontal 12/25/2015  . Cerebellar stroke ([place]) 12/02/2014  . Osteopenia 12/01/2014  . Rhinitis, chronic 01/13/2014  . Breast cancer of upper-outer quadrant of right female breast ([place]) 09/26/2013    Dorene Ar , PTA  04/16/2016, 1:08 PM  [place], [place], [address] Phone: [phone]   Fax:  [phone]  Name: STARIA BIRKHEAD MRN: [id number] Date of Birth: Jan 16, 1951

## 2016-04-16 NOTE — Patient Instructions (Signed)
Levator Stretch   Grasp seat or sit on hand on side to be stretched. Turn head toward other side and look down. Use hand on head to gently stretch neck in that position. Hold _20-30___ seconds. Repeat on other side. Repeat ___3_ times. Do __2__ sessions per day.  http://gt2.exer.us/30   Copyright  VHI. All rights reserved.  Side-Bending   One hand on opposite side of head, pull head to side as far as is comfortable. Stop if there is pain. Hold __20__ seconds. Repeat with other hand to other side. Repeat __3__ times. Do ___2_ sessions per day.   Copyright  VHI. All rights reserved.  Scapular Retraction (Standing)   With arms at sides, pinch shoulder blades together. Repeat _10___ times per set. Do __2__ sets per session. Do __2__ sessions per day.  http://orth.exer.us/944   Copyright  VHI. All rights reserved.  Chin Protraction / Retraction   Slide head forward keeping chin level. Slide head back, pulling chin in. Hold each position _5__ seconds. Repeat _10__ times. Do _2__ sessions per day.  Copyright  VHI. All rights reserved.

## 2016-04-18 ENCOUNTER — Ambulatory Visit: Payer: PPO | Admitting: Physical Therapy

## 2016-04-18 DIAGNOSIS — M542 Cervicalgia: Secondary | ICD-10-CM | POA: Diagnosis not present

## 2016-04-18 DIAGNOSIS — R252 Cramp and spasm: Secondary | ICD-10-CM

## 2016-04-18 NOTE — Patient Instructions (Signed)

## 2016-04-18 NOTE — Therapy (Addendum)
[place], [place], [address] Phone: [phone]   Fax:  [phone]  Physical Therapy Treatment  Patient Details  Name: Barbara [name] MRN: [id number] Date of Birth: 02/11/1951 Referring Provider: Gaynelle Arabian, MD  Encounter Date: 04/18/2016    Past Medical History  Diagnosis Date  . Breast cancer ([place])   . Hashimoto's thyroiditis     with goiter  . Hypercholesteremia   . 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  Narrowing of the 4th and 5th lumbar interspaces.   Marland Kitchen Spina bifida ([place])     occutta L5  . Anxiety   . GERD (gastroesophageal reflux disease)   . Headache(784.0)     hx migraines  . Varicose vein   . Stroke [facility]) 11/2015    Past Surgical History  Procedure Laterality Date  . Hernia repair  1976    Left groin  . Tubal ligation    . Cystoscopy with urethral caruncle    . Mastectomy w/ sentinel node biopsy Bilateral 10/13/2013    Procedure:  BILATERAL MASTECTOMY WITH RIGHT SENTINEL LYMPH NODE BIOPSY;  Surgeon: Barbara Roof, MD;  Location: [place];  Service: General;  Laterality: Bilateral;  . Portacath placement Left 10/13/2013    Procedure: INSERTION PORT-A-CATH;  Surgeon: Barbara Roof, MD;  Location: [place];  Service: General;  Laterality: Left;  . Breast reconstruction with placement of tissue expander and flex hd (acellular hydrated dermis) Bilateral 10/13/2013    Procedure: PLACEMENT OF BILATERAL TISSUE EXPANDER FOR BREAST RECONSTRUCTION ;  Surgeon: Barbara Reese, MD;  Location: [place];  Service: Plastics;  Laterality: Bilateral;  . Wisdom tooth extraction  1990  . Port-a-cath removal Left 12/21/2014    Procedure: MINOR REMOVAL OF PORT-A-CATH;  Surgeon: Barbara Messing III, MD;  Location: [place];  Service: General;  Laterality: Left;    There were no vitals filed for this  visit.                                 PT Short Term Goals - 04/18/16 1139    PT SHORT TERM GOAL #1   Title she will be independent with initial HEP   Status Achieved   PT SHORT TERM GOAL #2   Title She will report pain decr 25% or more    Status Achieved   PT SHORT TERM GOAL #3   Title She will improve cervical rotation to 60 degree sor more    Status Achieved   PT SHORT TERM GOAL #4   Title She will be able to side bend to 30 degrees to LT    Status Achieved           PT Long Term Goals - 04/18/16 1139    PT LONG TERM GOAL #1   Title She will be able to demo all HEP issued   Status Achieved   PT LONG TERM GOAL #2   Title She will report pain as intermitant  but not limiting activity   Status Achieved   PT LONG TERM GOAL #3   Title She will have cervical ROM equal RT to LT with 1/10 max pain.    Baseline rotation 65 and 70   Status Partially Met   PT LONG TERM GOAL #4   Title she will return to gardening with  1/10 max pain intermittantly   Status Achieved   PT LONG TERM GOAL #5   Title she will be able to sleep in bed 6-7 hours without increased pain   Status Achieved             Patient will benefit from skilled therapeutic intervention in order to improve the following deficits and impairments:  Decreased range of motion, Pain, Increased muscle spasms, Decreased activity tolerance  Visit Diagnosis: Cervicalgia  Cramp and spasm       G-Codes - 05-16-16 1322    Functional Assessment Tool Used FOTO 17%   Functional Limitation Changing and maintaining body position   Changing and Maintaining Body Position Goal Status (C5852) At least 20 percent but less than 40 percent impaired, limited or restricted   Changing and Maintaining Body Position Discharge Status (D7824) At least 1 percent but less than 20 percent impaired, limited or restricted      Problem List Patient Active Problem List   Diagnosis Date Noted  . Genetic  testing 03/06/2016  . Hemorrhagic stroke ([place]) 01/29/2016  . SAH (subarachnoid hemorrhage) ([place]) 01/29/2016  . Memory loss 01/29/2016  . Paroxysmal atrial fibrillation ([place]) 01/29/2016  . Cerebral amyloid angiopathy 01/29/2016  . ICH (intracerebral hemorrhage) ([place]) - R occipital 12/27/2015  . Atrial fibrillation with RVR ([place]) 12/27/2015  . Hyperlipidemia 12/27/2015  . Cognitive deficits 12/27/2015  . Hypothyroidism 12/27/2015  . Anxiety 12/27/2015  . Subarachnoid hemorrhage ([place]) - L frontal 12/25/2015  . Cerebellar stroke ([place]) 12/02/2014  . Osteopenia 12/01/2014  . Rhinitis, chronic 01/13/2014  . Breast cancer of upper-outer quadrant of right female breast ([place]) 09/26/2013    Barbara [name], PTA May 16, 2016, 1:25 PM  [place], [place], [address] Phone: [phone]   Fax:  [phone]  Name: Barbara [name] MRN: [id number] Date of Birth: 03-Oct-1951    PHYSICAL THERAPY DISCHARGE SUMMARY  Visits from Start of Care: 3  Current functional level related to goals / functional outcomes: See above   Remaining deficits: See above   Education / Equipment: HEP Plan: Patient agrees to discharge.  Patient goals were partially met. Patient is being discharged due to being pleased with the current functional level.  ?????   Barbara [name]   PT                  05-16-16                  1:20 PM

## 2016-04-22 ENCOUNTER — Other Ambulatory Visit: Payer: PPO

## 2016-04-22 DIAGNOSIS — M542 Cervicalgia: Secondary | ICD-10-CM | POA: Diagnosis not present

## 2016-04-23 ENCOUNTER — Encounter: Payer: PPO | Admitting: Physical Therapy

## 2016-04-25 ENCOUNTER — Encounter: Payer: PPO | Admitting: Physical Therapy

## 2016-04-28 ENCOUNTER — Ambulatory Visit
Admission: RE | Admit: 2016-04-28 | Discharge: 2016-04-28 | Disposition: A | Discharge status: Home / Self Care | Payer: PPO | Source: Ambulatory Visit | Attending: Diagnostic Neuroimaging | Admitting: Diagnostic Neuroimaging

## 2016-04-28 DIAGNOSIS — I609 Nontraumatic subarachnoid hemorrhage, unspecified: Secondary | ICD-10-CM

## 2016-04-28 DIAGNOSIS — I480 Paroxysmal atrial fibrillation: Secondary | ICD-10-CM

## 2016-04-28 DIAGNOSIS — E854 Organ-limited amyloidosis: Secondary | ICD-10-CM

## 2016-04-28 DIAGNOSIS — I619 Nontraumatic intracerebral hemorrhage, unspecified: Secondary | ICD-10-CM

## 2016-04-28 DIAGNOSIS — R413 Other amnesia: Secondary | ICD-10-CM | POA: Diagnosis not present

## 2016-04-28 DIAGNOSIS — I680 Cerebral amyloid angiopathy: Secondary | ICD-10-CM

## 2016-04-28 DIAGNOSIS — L237 Allergic contact dermatitis due to plants, except food: Secondary | ICD-10-CM | POA: Diagnosis not present

## 2016-04-28 MED ORDER — GADOBENATE DIMEGLUMINE 529 MG/ML IV SOLN
13.0000 mL | Freq: Once | INTRAVENOUS | Status: AC | PRN
  Administered 2016-04-28: 13 mL via INTRAVENOUS

## 2016-04-30 ENCOUNTER — Ambulatory Visit (INDEPENDENT_AMBULATORY_CARE_PROVIDER_SITE_OTHER): Payer: PPO | Admitting: Diagnostic Neuroimaging

## 2016-04-30 ENCOUNTER — Encounter: Payer: Self-pay | Admitting: Diagnostic Neuroimaging

## 2016-04-30 ENCOUNTER — Encounter: Payer: PPO | Admitting: Physical Therapy

## 2016-04-30 VITALS — BP 148/77 | HR 77 | Ht 66.0 in | Wt 144.0 lb

## 2016-04-30 DIAGNOSIS — I4891 Unspecified atrial fibrillation: Secondary | ICD-10-CM

## 2016-04-30 DIAGNOSIS — I609 Nontraumatic subarachnoid hemorrhage, unspecified: Secondary | ICD-10-CM | POA: Diagnosis not present

## 2016-04-30 DIAGNOSIS — I680 Cerebral amyloid angiopathy: Secondary | ICD-10-CM

## 2016-04-30 DIAGNOSIS — E854 Organ-limited amyloidosis: Secondary | ICD-10-CM

## 2016-04-30 DIAGNOSIS — I618 Other nontraumatic intracerebral hemorrhage: Secondary | ICD-10-CM | POA: Diagnosis not present
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
## 2016-05-04 ENCOUNTER — Ambulatory Visit (INDEPENDENT_AMBULATORY_CARE_PROVIDER_SITE_OTHER): Payer: PPO | Admitting: Neurology

## 2016-05-04 DIAGNOSIS — G4733 Obstructive sleep apnea (adult) (pediatric): Secondary | ICD-10-CM | POA: Diagnosis not present

## 2016-05-04 DIAGNOSIS — G4731 Primary central sleep apnea: Secondary | ICD-10-CM

## 2016-05-04 DIAGNOSIS — G4720 Circadian rhythm sleep disorder, unspecified type: Secondary | ICD-10-CM

## 2016-05-04 DIAGNOSIS — G4739 Other sleep apnea: Secondary | ICD-10-CM

## 2016-05-05 ENCOUNTER — Other Ambulatory Visit: Payer: Self-pay | Admitting: *Deleted

## 2016-05-06 ENCOUNTER — Encounter: Payer: PPO | Admitting: Physical Therapy

## 2016-05-07 ENCOUNTER — Encounter: Payer: PPO | Admitting: Physical Therapy

## 2016-05-08 ENCOUNTER — Telehealth: Payer: Self-pay | Admitting: Neurology

## 2016-05-08 NOTE — Telephone Encounter (Signed)
I spoke to patient. She had her titration study. I advised her that as soon as I get those results I will call her and we will talk about a DME referral. Patient voiced understanding.

## 2016-05-08 NOTE — Telephone Encounter (Signed)
Pt said she had a sleep study on 05/04/16. She is wanting to know where to go from here. Operator explained it could be a week before results are finalized, provider has to read it. She understood but would like the RN to call her.

## 2016-05-09 ENCOUNTER — Encounter: Payer: PPO | Admitting: Physical Therapy

## 2016-05-09 ENCOUNTER — Telehealth: Payer: Self-pay | Admitting: Neurology

## 2016-05-09 DIAGNOSIS — G4733 Obstructive sleep apnea (adult) (pediatric): Secondary | ICD-10-CM

## 2016-05-09 DIAGNOSIS — G4739 Other sleep apnea: Secondary | ICD-10-CM

## 2016-05-09 DIAGNOSIS — G4731 Primary central sleep apnea: Secondary | ICD-10-CM

## 2016-05-09 NOTE — Telephone Encounter (Signed)
Patient referred by Dr. Leta Baptist, seen by me on 02/12/16, diagnostic PSG on 03/05/16, cpap study on 05/04/16, ins: HTA/MCR.   Please call and inform patient that I have entered an order for treatment with positive airway pressure (PAP) treatment of obstructive sleep apnea (OSA). She did fairly during the latest sleep study with CPAP. We will, therefore, arrange for a machine for home use through a DME (durable medical equipment) company of Her choice; and I will see the patient back in follow-up in about 8-10 weeks. Please also explain to the patient that I will be looking out for compliance data, which can be downloaded from the machine (stored on an SD card, that is inserted in the machine) or via remote access through a modem, that is built into the machine. At the time of the followup appointment we will discuss sleep study results and how it is going with PAP treatment at home. Please advise patient to bring Her machine at the time of the first FU visit, even though this is cumbersome. Bringing the machine for every visit after that will likely not be needed, but often helps for the first visit to troubleshoot if needed. Please re-enforce the importance of compliance with treatment and the need for [place] to monitor compliance data - often an insurance requirement and actually good feedback for the patient as far as how they are doing.  Also remind patient, that any interim PAP machine or mask issues should be first addressed with the DME company, as they can often help better with technical and mask fit issues. Please ask if patient has a preference regarding DME company.  Please also make sure, the patient has a follow-up appointment with me in about 8-10 weeks from the setup date, thanks.  Once you have spoken to the patient - and faxed/routed report to PCP and referring MD (if other than PCP), you can close this encounter, thanks,   Star Age, MD, PhD [facility] ([place])

## 2016-05-12 NOTE — Telephone Encounter (Signed)
I spoke to patient and she is aware of results and recommendations. She is willing to proceed with treatment. I advised her that we will send orders to [facility]. I will also send her a letter to remind her to make f/u appt with [place] and stress the importance of compliance. Patient aware to call us if any further questions.

## 2016-05-14 ENCOUNTER — Encounter: Payer: PPO | Admitting: Physical Therapy

## 2016-05-15 DIAGNOSIS — G4733 Obstructive sleep apnea (adult) (pediatric): Secondary | ICD-10-CM | POA: Diagnosis not present

## 2016-05-15 DIAGNOSIS — I480 Paroxysmal atrial fibrillation: Secondary | ICD-10-CM | POA: Diagnosis not present

## 2016-05-15 DIAGNOSIS — M8588 Other specified disorders of bone density and structure, other site: Secondary | ICD-10-CM | POA: Diagnosis not present

## 2016-05-15 DIAGNOSIS — E7800 Pure hypercholesterolemia, unspecified: Secondary | ICD-10-CM | POA: Diagnosis not present

## 2016-05-15 DIAGNOSIS — E039 Hypothyroidism, unspecified: Secondary | ICD-10-CM | POA: Diagnosis not present

## 2016-05-15 DIAGNOSIS — Z23 Encounter for immunization: Secondary | ICD-10-CM | POA: Diagnosis not present

## 2016-05-15 DIAGNOSIS — H6120 Impacted cerumen, unspecified ear: Secondary | ICD-10-CM | POA: Diagnosis not present

## 2016-05-15 DIAGNOSIS — Z853 Personal history of malignant neoplasm of breast: Secondary | ICD-10-CM | POA: Diagnosis not present

## 2016-05-15 DIAGNOSIS — Z0000 Encounter for general adult medical examination without abnormal findings: Secondary | ICD-10-CM | POA: Diagnosis not present

## 2016-05-15 DIAGNOSIS — F329 Major depressive disorder, single episode, unspecified: Secondary | ICD-10-CM | POA: Diagnosis not present

## 2016-05-15 DIAGNOSIS — I619 Nontraumatic intracerebral hemorrhage, unspecified: Secondary | ICD-10-CM | POA: Diagnosis not present

## 2016-05-15 DIAGNOSIS — C50911 Malignant neoplasm of unspecified site of right female breast: Secondary | ICD-10-CM | POA: Diagnosis not present

## 2016-05-26 DIAGNOSIS — G4733 Obstructive sleep apnea (adult) (pediatric): Secondary | ICD-10-CM | POA: Diagnosis not present

## 2016-06-10 ENCOUNTER — Encounter: Payer: Self-pay | Admitting: Oncology

## 2016-06-10 DIAGNOSIS — M8589 Other specified disorders of bone density and structure, multiple sites: Secondary | ICD-10-CM | POA: Diagnosis not present

## 2016-06-10 DIAGNOSIS — Z853 Personal history of malignant neoplasm of breast: Secondary | ICD-10-CM | POA: Diagnosis not present

## 2016-06-10 DIAGNOSIS — Z5111 Encounter for antineoplastic chemotherapy: Secondary | ICD-10-CM | POA: Diagnosis not present

## 2016-06-17 ENCOUNTER — Ambulatory Visit: Payer: PPO | Admitting: Obstetrics & Gynecology

## 2016-06-23 ENCOUNTER — Ambulatory Visit: Payer: PPO | Admitting: Cardiovascular Disease

## 2016-06-26 DIAGNOSIS — G4733 Obstructive sleep apnea (adult) (pediatric): Secondary | ICD-10-CM | POA: Diagnosis not present

## 2016-07-07 ENCOUNTER — Ambulatory Visit: Payer: PPO | Admitting: Neurology

## 2016-07-16 ENCOUNTER — Ambulatory Visit (INDEPENDENT_AMBULATORY_CARE_PROVIDER_SITE_OTHER): Payer: PPO | Admitting: Neurology

## 2016-07-16 ENCOUNTER — Telehealth: Payer: Self-pay | Admitting: Diagnostic Neuroimaging

## 2016-07-16 ENCOUNTER — Encounter: Payer: Self-pay | Admitting: Neurology

## 2016-07-16 VITALS — BP 140/62 | HR 78 | Resp 16 | Ht 66.0 in | Wt 150.0 lb

## 2016-07-16 DIAGNOSIS — G4733 Obstructive sleep apnea (adult) (pediatric): Secondary | ICD-10-CM | POA: Diagnosis not present

## 2016-07-16 DIAGNOSIS — Z9989 Dependence on other enabling machines and devices: Principal | ICD-10-CM

## 2016-07-16 DIAGNOSIS — G4739 Other sleep apnea: Secondary | ICD-10-CM

## 2016-07-16 DIAGNOSIS — G4731 Primary central sleep apnea: Secondary | ICD-10-CM

## 2016-07-16 NOTE — Telephone Encounter (Signed)
Dr. Leta Baptist was going to speak with a cardiologist about her problems and let her know if she was going to see a cardiologist or not. She said she hasn't heard anything yet and was wondering what the status was on it. The best number to contact the patient is [phone]

## 2016-07-16 NOTE — Progress Notes (Signed)
Subjective:    Patient ID: Barbara [name] is a 65 y.o. female.  HPI     Interim history:   Barbara [name] is a 65 year old right-handed woman with an underlying medical history of spina bifida, anxiety, reflux disease, migraine headaches, breast cancer, Hashimoto thyroiditis with history of goiter, hyperlipidemia, memory loss, cerebral amyloid angiopathy with history of hemorrhagic stroke, with intraocular cerebral hemorrhage and subarachnoid hemorrhage in January 2017, with new diagnosis of paroxysmal A. fib, who presents for follow-up consultation of her OSA, after her recent CPAP titration study. The patient is unaccompanied today. I last saw her on 03/19/2016, at which time we talked about her baseline sleep study results. She agreed to return for CPAP titration to treat her OSA. She had a CPAP titration study on 05/04/2016. Went over her test results with her in detail today. Sleep efficiency was 82.2%, sleep latency 17.5 minutes and wake after sleep onset was 65 minutes with mild to moderate sleep fragmentation noted. She had an increased percentage of light stage sleep, near absence of slow-wave sleep and a decreased percentage of REM sleep at 12.2% with a prolonged REM latency. She had no significant PLMS, EKG or EEG changes. She had a total of 6 obstructive, 7 mixed and 41 central apneas and 9 obstructive hypopneas for the night, CPAP was titrated from 5 cm to 12 cm. She had significant increase in central apneas on 8 cm and higher. She was switched to BiPAP of 15/10 but improved with BiPAP ST of 16/11 with a rate of 12. On a CPAP pressure of 7 cm she had an AHI of 3.6 with nonsupine REM sleep achieved, O2 nadir was about 87% on that pressure, average oxygen saturation 94% for the night, nadir was 82%.   Today, 07/16/2016: I reviewed her CPAP compliance data from 06/15/2016 through 07/14/2016 which is a total of 30 days, during which time she used her machine every night with percent used days  greater than 4 hours at 97%, indicating excellent compliance with an average usage for all nights of 8 hours and 31 minutes, residual AHI suboptimal at 8 per hour, leak acceptable for the 95th percentile at 10.4 L/m on a pressure of 7 cm with EPR of 3.   Today, 07/16/2016: She reports that she is doing fairly well, adjusting still to CPAP, and while she tolerates the FFM, she has a sore spot on the nose. She has no new complaints. She feels better with her sleep, less sleep disruption, wakes up better rested in AM. Nocturia is better.   Previously:   I first met her on 02/12/2016 at the request of Dr. Leta Baptist, at which time the patient reported snoring and excessive daytime somnolence as well as witnessed apneic breathing pauses. She was invited for sleep study. She had a baseline sleep study on 03/05/2016. Sleep efficiency was 85.7% with a prolonged sleep latency of 33.5 minutes and wake after sleep onset of 33.5 minutes with one longer period of wakefulness, otherwise minimal sleep fragmentation noted. She had an elevated arousal index. She had an increased percentage of stage II sleep, absence of slow-wave sleep and a decreased percentage of REM sleep at 9.8% with a markedly prolonged REM latency of 330 minutes. She had no significant PLMS, EKG or EEG changes. She had mild intermittent snoring. Total AHI was 7.8 per hour, rising to 15.9 per hour in the supine position. Average oxygen saturation was 93%, nadir was 83%. Time below 88% saturation was 8 minutes and 44  seconds. I suggested that the patient return for a second sleep study for CPAP titration. She requested a follow-up appointment to discuss further.    03/19/2016: She reports feeling stable. She has no new complaints. She is trying to exercise. She hydrates well with water. She likes to do yard work. Sleep-wise, she felt she had slept well during the first sleep study and would be willing to try CPAP therapy. She has had no new neurological  symptoms. Thankfully, she is making progress with her speech as well. She does do some of the exercises she was shown by physical therapy as well.    02/12/2016: She reports snoring, and EDS. Her husband has noted apneic pauses in her sleep. Her daughter-in-law, who is a Designer, jewellery has also witnessed apneic pauses in her sleep. She has nocturia about once a night. She has some aching in her thighs since she started atorvastatin. She has not seen a cardiologist for her paroxysmal A. fib. She has seen her primary care physician after her hospital discharge and had an EKG. Bedtime is around 9 PM, sometimes it takes her 30-60 minutes to fall asleep but she does not take anything for sleep. Right time is between 7:30 and 8:30. Epworth sleepiness score is 2 out of 24, her fatigue score is 9 out of 63. She is somewhat tired during the day at times. She used to have morning headaches in the past but nothing recently. She denies actual restless legs type symptoms but has to sleep with a pillow between her legs. She also has a history of scoliosis which makes her uncomfortable sleeping on her back, she is a side sleeper. Her son has obstructive sleep apnea. She lives with her husband. 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 She drinks alcohol very rarely, caffeine about half a cap of coffee, nonsmoker. She has 2 grown children.   They do not have any pets. They do not watch TV in bed.     I reviewed your office note from 01/29/2016. She had a head CT without contrast on 12/24/2015: IMPRESSION: 1. Focus of acute hemorrhage in the right occipital lobe near the gray-white junction. This could reflect a hemorrhagic metastasis or hemorrhage from amyloid angiopathy. There is no history of trauma or anti coagulation. 2. Possible cortical thickening in the left frontal lobe suggesting possible cerebritis. 3. MRI without and with contrast recommended for further evaluation. 4. Critical  Value/emergent results were called by telephone at the time of interpretation on 12/24/2015 at 9:40 pm to Dr. Virgel Manifold, who verbally acknowledged these results.   In addition, personally reviewed the images through the PACS system.    She had a brain MRI with and without contrast on 12/24/2015: IMPRESSION: 1. 14 mm acute parenchymal hemorrhage within the right occipital lobe without significant mass effect. No underlying lesion identified. 2. Scattered acute subarachnoid hemorrhage within the left frontal lobe. Etiology of the right occipital and left frontal hemorrhage is uncertain. Primary differential considerations include possible occult trauma or underlying coagulopathy. Hemorrhage related to underlying hypertension could also be considered. Presence of subarachnoid blood is not typical of underlying amyloid angiopathy. The major dural sinuses appear patent. 3. Scattered chronic micro hemorrhages involving both cerebral hemispheres, nonspecific, but most commonly related to chronic underlying hypertension. 4. Mild chronic small vessel ischemic disease. In addition, I personally reviewed the images through the PACS system.  Her Past Medical History Is Significant For: Past Medical History:  Diagnosis Date  .  Anxiety   . Breast cancer ([place])   . GERD (gastroesophageal reflux disease)   . Hashimoto's thyroiditis    with goiter  . Headache(784.0)    hx migraines  . Hypercholesteremia   . Spina bifida ([place])    occutta L5  . 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  Narrowing of the 4th and 5th lumbar interspaces.   . Stroke ([place]) 11/2015  . Varicose vein     Her Past Surgical History Is Significant For: Past Surgical History:  Procedure Laterality Date  . BREAST RECONSTRUCTION WITH PLACEMENT OF TISSUE EXPANDER AND FLEX HD (ACELLULAR HYDRATED DERMIS) Bilateral 10/13/2013   Procedure: PLACEMENT OF BILATERAL TISSUE EXPANDER  FOR BREAST RECONSTRUCTION ;  Surgeon: Crissie Reese, MD;  Location: [facility];  Service: Plastics;  Laterality: Bilateral;  . CYSTOSCOPY WITH URETHRAL CARUNCLE    . HERNIA REPAIR  1976   Left groin  . MASTECTOMY W/ SENTINEL NODE BIOPSY Bilateral 10/13/2013   Procedure:  BILATERAL MASTECTOMY WITH RIGHT SENTINEL LYMPH NODE BIOPSY;  Surgeon: Merrie Roof, MD;  Location: [facility];  Service: General;  Laterality: Bilateral;  . PORT-A-CATH REMOVAL Left 12/21/2014   Procedure: MINOR REMOVAL OF PORT-A-CATH;  Surgeon: Autumn Messing III, MD;  Location: [facility];  Service: General;  Laterality: Left;  . PORTACATH PLACEMENT Left 10/13/2013   Procedure: INSERTION PORT-A-CATH;  Surgeon: Merrie Roof, MD;  Location: [facility];  Service: General;  Laterality: Left;  . TUBAL LIGATION    . [place]    Her Family History Is Significant For: Family History  Problem Relation Age of Onset  . Breast cancer Mother 24  . Lung cancer Father   . Cancer Father   . Heart attack Father   . Breast cancer Sister 67    Bilateral breast cancer with 2nd dx at 1  . Breast cancer Maternal Aunt 52  . Breast cancer Sister 28  . Lung cancer Maternal Uncle   . Leukemia Paternal Uncle   . Melanoma Brother 52    on ear  . Prostate cancer Brother 76  . Cancer Cousin     maternal cousin with cancer - NOS  . Hyperthyroidism Daughter     Her Social History Is Significant For: Social History   Social History  . Marital status: Married    Spouse name: Percell Miller  . Number of children: 2  . Years of education: LPN   Occupational History  . Retired [employer]    [employer].   Social History Main Topics  . Smoking status: Never Smoker  . Smokeless tobacco: Never Used  . Alcohol use 0.0 oz/week     Comment: Rare  . Drug use: No  . Sexual activity: No     Comment: BTL   Other Topics Concern  . None   Social History Narrative   Patient lives at home with her spouse.   Caffeine  Use:0.5-1 cup of coffee in the a.m.    Her Allergies Are:  Allergies  Allergen Reactions  . Buprenex [Buprenorphine] Nausea And Vomiting and Other (See Comments)    Oversedation  . Simvastatin Other (See Comments)    Myalgias   . Codeine Hives, Itching and Rash  :   Her Current Medications Are:  Outpatient Encounter Prescriptions as of 07/16/2016  Medication Sig  . Acetaminophen (TYLENOL PO) Take by mouth as needed (dosage unspecified).  Marland Kitchen  anastrozole (ARIMIDEX) 1 MG tablet Take 1 tablet (1 mg total) by mouth daily.  Marland Kitchen atorvastatin (LIPITOR) 20 MG tablet Take 20 mg by mouth daily.  Marland Kitchen BIOTIN PO Take 5,000 mcg by mouth every other day.   . chlorzoxazone (PARAFON) 500 MG tablet Take by mouth 4 (four) times daily as needed for muscle spasms.  . cholecalciferol 5000 UNITS TABS Take 1 tablet (5,000 Units total) by mouth daily.  . fexofenadine (ALLEGRA) 180 MG tablet Take 180 mg by mouth as needed.   . Fluticasone Propionate (FLONASE NA) Place into the nose as needed.  Marland Kitchen levothyroxine (SYNTHROID, LEVOTHROID) 50 MCG tablet Take 50 mcg by mouth daily before breakfast. Take 50 mcg 1 hours before breakfast.  . OMEPRAZOLE PO Take by mouth daily.  Marland Kitchen PARoxetine (PAXIL) 20 MG tablet Take 20 mg by mouth at bedtime.   Marland Kitchen Propylene Glycol (SYSTANE BALANCE) 0.6 % SOLN Apply 1 drop to eye daily as needed (dry eyes).  . [DISCONTINUED] atorvastatin (LIPITOR) 10 MG tablet Take 10 mg by mouth at bedtime.   . [DISCONTINUED] HYDROcodone-acetaminophen (NORCO/VICODIN) 5-325 MG tablet Take 1 tablet by mouth every 6 (six) hours as needed for moderate pain.   No facility-administered encounter medications on file as of 07/16/2016.   :  Review of Systems:  Out of a complete 14 point review of systems, all are reviewed and negative with the exception of these symptoms as listed below:  Review of Systems  Neurological:       Patient feels that she is doing ok on CPAP. States that she gets a red place on her nose  after taking that CPAP mask off.     Objective:  Neurologic Exam  Physical Exam Physical Examination:   Vitals:   07/16/16 1614  BP: 140/62  Pulse: 78  Resp: 16   General Examination: The patient is a very pleasant 65 y.o. female in no acute distress. She appears well-developed and well-nourished and well groomed. She is in good spirits today.  HEENT: Normocephalic, atraumatic, pupils are equal, round and reactive to light and accommodation. She has bilateral cataracts. Extraocular tracking is good without limitation to gaze excursion or nystagmus noted. Normal smooth pursuit is noted. Hearing is grossly intact. Face is symmetric with normal facial animation and normal facial sensation. Speech is mildly dysarthric, stable. There is no hypophonia. There is no lip, neck/head, jaw or voice tremor. Neck is supple with full range of passive and active motion. There are no carotid bruits on auscultation. Oropharynx exam reveals: mild mouth dryness, adequate dental hygiene and mild airway crowding, due to redundant soft palate and smaller airway entry. Mallampati is class I. Tongue protrudes centrally and palate elevates symmetrically. Tonsismall bilaterally.     Chest: Clear to auscultation without wheezing, rhonchi or crackles noted.  Heart: S1+S2+0, regular and normal without murmurs, rubs or gallops noted.   Abdomen: Soft, non-tender and non-distended with normal bowel sounds appreciated on auscultation.  Extremities: There is no pitting edema in the distal lower extremities bilaterally, is wearing thigh-high compression stockings.    Skin: Warm and dry without trophic changes noted.   Musculoskeletal: exam reveals no obvious joint deformities, tenderness or joint swelling or erythema, with the exception of mild scoliosis.   Neurologically:  Mental status: The patient is awake, alert and oriented in all 4 spheres. Her immediate and remote memory, attention, language skills and fund of  knowledge are very mildly impaired, she has mild slowness in thinking and mild word finding difficulties,  mild dysarthria, stable. Thought process is linear. Mood is normal and affect is normal.  Cranial nerves II - XII are as described above under HEENT exam. In addition: shoulder shrug is normal with equal shoulder height noted. Motor exam: Normal bulk, strength and tone is noted. There is no drift, tremor or rebound. Romberg is negative. Reflexes are 2+ to 3+ throughout. Fine motor skills and coordination: very mildly slow. Very mild difficulty with heel to shin.   Sensory exam: intact to light touch in the upper and lower extremities.  Gait, station and balance: She stands easily. No veering to one side is noted. No leaning to one side is noted. Posture is age-appropriate and stance is narrow based. Gait shows normal stride length and normal pace. No problems turning are noted. She turns slightly slowly.   Assessment and Plan:  In summary, Barbara [name] is a very pleasant 65 year old female with an underlying medical history of spina bifida, anxiety, reflux disease, migraine headaches, breast cancer, Hashimoto thyroiditis with history of goiter, hyperlipidemia, memory loss, cerebral amyloid angiopathy with history of hemorrhagic stroke, with intraocular cerebral hemorrhage and subarachnoid hemorrhage in January 2017, with new diagnosis of paroxysmal A. fib, who presents for follow up consultationOf her obstructive sleep apnea, now established on CPAP therapy after her CPAP titration study recently. She had a baseline sleep study on 03/05/2016 and we talked about her test results and her follow-up appointment in April 2017. Since then, she had a CPAP titration study on 05/04/2016. She has established CPAP therapy at a pressure of 7 cm, she is fully compliant with treatment and indicates fairly good results, sleeping better, less sleep interruption, better nocturia, wakes up better rested. Exam is  stable. Residual AHI is suboptimal at 8 per hour and I recommended that we increase her pressure to 8 cm at this time. She is using a fullface mask. She has a small area of soreness on the nasal bridge. I recommended we try to fit her with a different full face mask. I placed an order for this. She has an appointment with Dr. Leta Baptist next month. I asked her to continue to be compliant with CPAP therapy. She is agreeable. I will see her back in 6 months, sooner if the need arises. I answered all her questions today and the patient was in agreement.  I spent 25 minutes in total face-to-face time with the patient, more than 50% of which was spent in counseling and coordination of care, reviewing test results, reviewing medication and discussing or reviewing the diagnosis of OSA, its prognosis and treatment options.

## 2016-07-16 NOTE — Patient Instructions (Signed)
Please continue using your CPAP regularly. While your insurance requires that you use CPAP at least 4 hours each night on 70% of the nights, I recommend, that you not skip any nights and use it throughout the night if you can. Getting used to CPAP and staying with the treatment long term does take time and patience and discipline. Untreated obstructive sleep apnea when it is moderate to severe can have an adverse impact on cardiovascular health and raise her risk for heart disease, arrhythmias, hypertension, congestive heart failure, stroke and diabetes. Untreated obstructive sleep apnea causes sleep disruption, nonrestorative sleep, and sleep deprivation. This can have an impact on your day to day functioning and cause daytime sleepiness and impairment of cognitive function, memory loss, mood disturbance, and problems focussing. Using CPAP regularly can improve these symptoms.  Keep up the good work! I will see you back in 6 months for sleep apnea check up, and if you continue to do well on CPAP I will see you once a year thereafter.   We will try to get you a different full face mask through your DME provider.   We will increase your CPAP pressure to 8 cm, to see if we can get better apnea control. Our apnea index goal in <5/hour.

## 2016-07-17 NOTE — Telephone Encounter (Signed)
I called patient. I had discussed with stroke team and determined that patient is not a good candidate for watchman device due to the need for short term warfarin + aspirin (45 days) after the procedure, which could cause bleeding in the brain based on patient's history.   Penni Bombard, MD [id number], [id number] Certified in Neurology, Neurophysiology and Neuroimaging  [facility] [address], [place], [place] [address] [phone]

## 2016-07-27 DIAGNOSIS — G4733 Obstructive sleep apnea (adult) (pediatric): Secondary | ICD-10-CM | POA: Diagnosis not present

## 2016-08-04 ENCOUNTER — Ambulatory Visit (INDEPENDENT_AMBULATORY_CARE_PROVIDER_SITE_OTHER): Payer: PPO | Admitting: Diagnostic Neuroimaging

## 2016-08-04 ENCOUNTER — Encounter: Payer: Self-pay | Admitting: Diagnostic Neuroimaging

## 2016-08-04 VITALS — BP 124/68 | HR 78 | Wt 151.6 lb

## 2016-08-04 DIAGNOSIS — I4891 Unspecified atrial fibrillation: Secondary | ICD-10-CM

## 2016-08-04 DIAGNOSIS — I611 Nontraumatic intracerebral hemorrhage in hemisphere, cortical: Secondary | ICD-10-CM

## 2016-08-04 DIAGNOSIS — G4733 Obstructive sleep apnea (adult) (pediatric): Secondary | ICD-10-CM | POA: Diagnosis not present

## 2016-08-04 DIAGNOSIS — I619 Nontraumatic intracerebral hemorrhage, unspecified: Secondary | ICD-10-CM
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
## 2016-08-04 NOTE — Patient Instructions (Signed)
    Thank you for coming to see [place] at [facility]. I hope we have been able to provide you high quality care today.  You may receive a patient satisfaction survey over the next few weeks. We would appreciate your feedback and comments so that we may continue to improve ourselves and the health of our patients.  - continue sleep apnea treatment  - brain / stroke healthy habits reviewed; diet, exercise and activities   ~~~~~~~~~~~~~~~~~~~~~~~~~~~~~~~~~~~~~~~~~~~~~~~~~~~~~~~~~~~~~~~~~  DR. PENUMALLI'S GUIDE TO HAPPY AND HEALTHY LIVING These are some of my general health and wellness recommendations. Some of them may apply to you better than others. Please use common sense as you try these suggestions and feel free to ask me any questions.   ACTIVITY/FITNESS Mental, social, emotional and physical stimulation are very important for brain and body health. Try learning a new activity (arts, music, language, sports, games).  Keep moving your body to the best of your abilities. You can do this at home, inside or outside, the park, community center, gym or anywhere you like. Consider a physical therapist or personal trainer to get started. Consider the app Sworkit. Fitness trackers such as smart-watches, smart-phones or Fitbits can help as well.   NUTRITION Eat more plants: colorful vegetables, nuts, seeds and berries.  Eat less sugar, salt, preservatives and processed foods.  Avoid toxins such as cigarettes and alcohol.  Drink water when you are thirsty. Warm water with a slice of lemon is an excellent morning drink to start the day.  Consider these websites for more information The Nutrition Source (https://www.henry-hernandez.biz/) Prec[employer] Nutrition (WindowBlog.ch)   RELAXATION Consider practicing mindfulness meditation or other relaxation techniques such as deep breathing, prayer, yoga, tai chi, massage. See website  mindful.org or the apps Headspace or Calm to help get started.   SLEEP Try to get at least 7-8+ hours sleep per day. Regular exercise and reduced caffeine will help you sleep better. Practice good sleep hygeine techniques. See website sleep.org for more information.   PLANNING Prepare estate planning, living will, healthcare POA documents. Sometimes this is best planned with the help of an attorney. Theconversationproject.org and agingwithdignity.org are excellent resources.

## 2016-08-06 DIAGNOSIS — M1711 Unilateral primary osteoarthritis, right knee: Secondary | ICD-10-CM | POA: Diagnosis not present

## 2016-08-26 DIAGNOSIS — G4733 Obstructive sleep apnea (adult) (pediatric): Secondary | ICD-10-CM | POA: Diagnosis not present

## 2016-09-08 DIAGNOSIS — G4733 Obstructive sleep apnea (adult) (pediatric): Secondary | ICD-10-CM | POA: Diagnosis not present

## 2016-09-16 DIAGNOSIS — H2513 Age-related nuclear cataract, bilateral: Secondary | ICD-10-CM | POA: Diagnosis not present

## 2016-09-26 DIAGNOSIS — G4733 Obstructive sleep apnea (adult) (pediatric): Secondary | ICD-10-CM | POA: Diagnosis not present

## 2016-10-26 DIAGNOSIS — G4733 Obstructive sleep apnea (adult) (pediatric): Secondary | ICD-10-CM | POA: Diagnosis not present

## 2016-11-25 DIAGNOSIS — G4733 Obstructive sleep apnea (adult) (pediatric): Secondary | ICD-10-CM | POA: Diagnosis not present

## 2016-11-25 DIAGNOSIS — E7800 Pure hypercholesterolemia, unspecified: Secondary | ICD-10-CM | POA: Diagnosis not present

## 2016-11-25 DIAGNOSIS — E039 Hypothyroidism, unspecified: Secondary | ICD-10-CM | POA: Diagnosis not present

## 2016-11-25 DIAGNOSIS — I6930 Unspecified sequelae of cerebral infarction: Secondary | ICD-10-CM | POA: Diagnosis not present

## 2016-11-26 DIAGNOSIS — G4733 Obstructive sleep apnea (adult) (pediatric): Secondary | ICD-10-CM | POA: Diagnosis not present

## 2016-12-27 DIAGNOSIS — G4733 Obstructive sleep apnea (adult) (pediatric): Secondary | ICD-10-CM | POA: Diagnosis not present

## 2017-01-07 ENCOUNTER — Encounter (HOSPITAL_COMMUNITY): Payer: Self-pay

## 2017-01-15 ENCOUNTER — Encounter: Payer: Self-pay | Admitting: Neurology

## 2017-01-15 ENCOUNTER — Ambulatory Visit (INDEPENDENT_AMBULATORY_CARE_PROVIDER_SITE_OTHER): Payer: PPO | Admitting: Neurology

## 2017-01-15 VITALS — BP 135/76 | HR 75 | Resp 20 | Ht 66.0 in | Wt 151.0 lb

## 2017-01-15 DIAGNOSIS — I619 Nontraumatic intracerebral hemorrhage, unspecified: Secondary | ICD-10-CM

## 2017-01-15 DIAGNOSIS — G4733 Obstructive sleep apnea (adult) (pediatric): Secondary | ICD-10-CM

## 2017-01-15 DIAGNOSIS — Z9989 Dependence on other enabling machines and devices: Secondary | ICD-10-CM | POA: Diagnosis not present

## 2017-01-15 NOTE — Progress Notes (Signed)
Subjective:    Patient ID: Barbara [name] is a 66 y.o. female.  HPI     Interim history:   Barbara [name] is a 66 year old right-handed woman with an underlying medical history of spina bifida, anxiety, reflux disease, migraine headaches, breast cancer, Hashimoto thyroiditis with history of goiter, hyperlipidemia, memory loss, cerebral amyloid angiopathy with history of hemorrhagic stroke, with intraocular cerebral hemorrhage and subarachnoid hemorrhage in January 2017, with new diagnosis of paroxysmal A. fib, who presents for follow-up consultation of her OSA, on CPAP treatment. The patient is unaccompanied today. I last saw her on 07/16/2016, at which time she was reported doing well, she was compliant with CPAP, she had developed a sore spot on the nose from the full facemask. Overall, she felt that she was sleeping better with less interruption at night and woke up better rested, nocturia had improved as well.  Today, 01/15/2017 (all dictated new, as well as above notes, some dictation done in note pad or Word, outside of chart, may appear as copied):  I reviewed her CPAP compliance data from 12/14/2016 through 01/12/2017 which is a total of 30 days, during which time she used her machine every night with percent used days greater than 4 hours at 97%, indicating excellent compliance with an average usage of 8 hours and 16 minutes, residual AHI 3.3 per hour, leak acceptable but on the higher end with the 95th percentile at 20.2 L/m on a pressure of 8 cm with EPR of 3. She reports doing well with CPAP, feeling well as well. She has no new neurological symptoms, no recent illness, no recent medication changes. Has noted some allergy symptoms flare up, has new supplies but has not changed her filter in about 2 months. Has never changed her hose. She has found a better way to tighten her CPAP mask without causing pain and major leakage. She is quite pleased with how she is doing. She saw Dr. Leta Baptist on  08/04/2016 and was doing well from a stroke standpoint, a one-year checkup was suggested at the time. She has a routine follow-up appointment with her oncologist for breast cancer history in April.   The patient's allergies, current medications, family history, past medical history, past social history, past surgical history and problem list were reviewed and updated as appropriate.   Previously (copied from previous notes for reference):   I saw her on 03/19/2016, at which time we talked about her baseline sleep study results. She agreed to return for CPAP titration to treat her OSA. She had a CPAP titration study on 05/04/2016. Went over her test results with her in detail today. Sleep efficiency was 82.2%, sleep latency 17.5 minutes and wake after sleep onset was 65 minutes with mild to moderate sleep fragmentation noted. She had an increased percentage of light stage sleep, near absence of slow-wave sleep and a decreased percentage of REM sleep at 12.2% with a prolonged REM latency. She had no significant PLMS, EKG or EEG changes. She had a total of 6 obstructive, 7 mixed and 41 central apneas and 9 obstructive hypopneas for the night, CPAP was titrated from 5 cm to 12 cm. She had significant increase in central apneas on 8 cm and higher. She was switched to BiPAP of 15/10 but improved with BiPAP ST of 16/11 with a rate of 12. On a CPAP pressure of 7 cm she had an AHI of 3.6 with nonsupine REM sleep achieved, O2 nadir was about 87% on that pressure, average oxygen saturation  94% for the night, nadir was 82%.    I reviewed her CPAP compliance data from 06/15/2016 through 07/14/2016 which is a total of 30 days, during which time she used her machine every night with percent used days greater than 4 hours at 97%, indicating excellent compliance with an average usage for all nights of 8 hours and 31 minutes, residual AHI suboptimal at 8 per hour, leak acceptable for the 95th percentile at 10.4 L/m on a  pressure of 7 cm with EPR of 3.    I first met her on 02/12/2016 at the request of Dr. Leta Baptist, at which time the patient reported snoring and excessive daytime somnolence as well as witnessed apneic breathing pauses. She was invited for sleep study. She had a baseline sleep study on 03/05/2016. Sleep efficiency was 85.7% with a prolonged sleep latency of 33.5 minutes and wake after sleep onset of 33.5 minutes with one longer period of wakefulness, otherwise minimal sleep fragmentation noted. She had an elevated arousal index. She had an increased percentage of stage II sleep, absence of slow-wave sleep and a decreased percentage of REM sleep at 9.8% with a markedly prolonged REM latency of 330 minutes. She had no significant PLMS, EKG or EEG changes. She had mild intermittent snoring. Total AHI was 7.8 per hour, rising to 15.9 per hour in the supine position. Average oxygen saturation was 93%, nadir was 83%. Time below 88% saturation was 8 minutes and 44 seconds. I suggested that the patient return for a second sleep study for CPAP titration. She requested a follow-up appointment to discuss further.    03/19/2016: She reports feeling stable. She has no new complaints. She is trying to exercise. She hydrates well with water. She likes to do yard work. Sleep-wise, she felt she had slept well during the first sleep study and would be willing to try CPAP therapy. She has had no new neurological symptoms. Thankfully, she is making progress with her speech as well. She does do some of the exercises she was shown by physical therapy as well.    02/12/2016: She reports snoring, and EDS. Her husband has noted apneic pauses in her sleep. Her daughter-in-law, who is a Designer, jewellery has also witnessed apneic pauses in her sleep. She has nocturia about once a night. She has some aching in her thighs since she started atorvastatin. She has not seen a cardiologist for her paroxysmal A. fib. She has seen her primary  care physician after her hospital discharge and had an EKG. Bedtime is around 9 PM, sometimes it takes her 30-60 minutes to fall asleep but she does not take anything for sleep. Right time is between 7:30 and 8:30. Epworth sleepiness score is 2 out of 24, her fatigue score is 9 out of 63. She is somewhat tired during the day at times. She used to have morning headaches in the past but nothing recently. She denies actual restless legs type symptoms but has to sleep with a pillow between her legs. She also has a history of scoliosis which makes her uncomfortable sleeping on her back, she is a side sleeper. Her son has obstructive sleep apnea. She lives with her husband. 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 She drinks alcohol very rarely, caffeine about half a cap of coffee, nonsmoker. She has 2 grown children.   They do not have any pets. They do not watch TV in bed.     I reviewed your office note from  01/29/2016. She had a head CT without contrast on 12/24/2015: IMPRESSION: 1. Focus of acute hemorrhage in the right occipital lobe near the gray-white junction. This could reflect a hemorrhagic metastasis or hemorrhage from amyloid angiopathy. There is no history of trauma or anti coagulation. 2. Possible cortical thickening in the left frontal lobe suggesting possible cerebritis. 3. MRI without and with contrast recommended for further evaluation. 4. Critical Value/emergent results were called by telephone at the time of interpretation on 12/24/2015 at 9:40 pm to Dr. Raeford Razor, who verbally acknowledged these results.   In addition, personally reviewed the images through the PACS system.    She had a brain MRI with and without contrast on 12/24/2015: IMPRESSION: 1. 14 mm acute parenchymal hemorrhage within the right occipital lobe without significant mass effect. No underlying lesion identified. 2. Scattered acute subarachnoid hemorrhage within the left frontal lobe. Etiology  of the right occipital and left frontal hemorrhage is uncertain. Primary differential considerations include possible occult trauma or underlying coagulopathy. Hemorrhage related to underlying hypertension could also be considered. Presence of subarachnoid blood is not typical of underlying amyloid angiopathy. The major dural sinuses appear patent. 3. Scattered chronic micro hemorrhages involving both cerebral hemispheres, nonspecific, but most commonly related to chronic underlying hypertension. 4. Mild chronic small vessel ischemic disease. In addition, I personally reviewed the images through the PACS system.  Her Past Medical History Is Significant For: Past Medical History:  Diagnosis Date  . Anxiety   . Breast cancer (HCC)   . GERD (gastroesophageal reflux disease)   . Hashimoto's thyroiditis    with goiter  . Headache(784.0)    hx migraines  . Hypercholesteremia   . Spina bifida (HCC)    occutta L5  . 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  Narrowing of the 4th and 5th lumbar interspaces.   . Stroke (HCC) 11/2015  . Varicose vein     Her Past Surgical History Is Significant For: Past Surgical History:  Procedure Laterality Date  . BREAST RECONSTRUCTION WITH PLACEMENT OF TISSUE EXPANDER AND FLEX HD (ACELLULAR HYDRATED DERMIS) Bilateral 10/13/2013   Procedure: PLACEMENT OF BILATERAL TISSUE EXPANDER FOR BREAST RECONSTRUCTION ;  Surgeon: Etter Sjogren, MD;  Location: [facility] OR;  Service: Plastics;  Laterality: Bilateral;  . CYSTOSCOPY WITH URETHRAL CARUNCLE    . HERNIA REPAIR  1976   Left groin  . MASTECTOMY W/ SENTINEL NODE BIOPSY Bilateral 10/13/2013   Procedure:  BILATERAL MASTECTOMY WITH RIGHT SENTINEL LYMPH NODE BIOPSY;  Surgeon: Robyne Askew, MD;  Location: [facility] OR;  Service: General;  Laterality: Bilateral;  . PORT-A-CATH REMOVAL Left 12/21/2014   Procedure: MINOR REMOVAL OF PORT-A-CATH;  Surgeon: Chevis Pretty III, MD;   Location: [facility];  Service: General;  Laterality: Left;  . PORTACATH PLACEMENT Left 10/13/2013   Procedure: INSERTION PORT-A-CATH;  Surgeon: Robyne Askew, MD;  Location: [facility] OR;  Service: General;  Laterality: Left;  . TUBAL LIGATION    . WISDOM TOOTH EXTRACTION  1990    Her Family History Is Significant For: Family History  Problem Relation Age of Onset  . Breast cancer Mother 53  . Lung cancer Father   . Cancer Father   . Heart attack Father   . Breast cancer Sister 53    Bilateral breast cancer with 2nd dx at 82  . Breast cancer Maternal Aunt 52  . Breast cancer Sister 32  . Lung cancer  Maternal Uncle   . Leukemia Paternal Uncle   . Melanoma Brother 52    on ear  . Prostate cancer Brother 1  . Cancer Cousin     maternal cousin with cancer - NOS  . Hyperthyroidism Daughter     Her Social History Is Significant For: Social History   Social History  . Marital status: Married    Spouse name: Ramon Dredge  . Number of children: 2  . Years of education: LPN   Occupational History  . Retired [employer]    [employer].   Social History Main Topics  . Smoking status: Never Smoker  . Smokeless tobacco: Never Used  . Alcohol use 0.0 oz/week     Comment: Rare  . Drug use: No  . Sexual activity: No     Comment: BTL   Other Topics Concern  . None   Social History Narrative   Patient lives at home with her spouse.   Caffeine Use:0.5-1 cup of coffee in the a.m.    Her Allergies Are:  Allergies  Allergen Reactions  . Buprenex [Buprenorphine] Nausea And Vomiting and Other (See Comments)    Oversedation  . Simvastatin Other (See Comments)    Myalgias   . Codeine Hives, Itching and Rash  :   Her Current Medications Are:  Outpatient Encounter Prescriptions as of 01/15/2017  Medication Sig  . Acetaminophen (TYLENOL PO) Take by mouth as needed (dosage unspecified).  Marland Kitchen anastrozole (ARIMIDEX) 1 MG tablet Take 1 tablet (1 mg total) by mouth  daily.  Marland Kitchen atorvastatin (LIPITOR) 20 MG tablet Take 20 mg by mouth daily.  Marland Kitchen BIOTIN PO Take 5,000 mcg by mouth every other day.   . cholecalciferol 5000 UNITS TABS Take 1 tablet (5,000 Units total) by mouth daily.  . fexofenadine (ALLEGRA) 180 MG tablet Take 180 mg by mouth as needed.   . Fluticasone Propionate (FLONASE NA) Place into the nose as needed.  Marland Kitchen levothyroxine (SYNTHROID, LEVOTHROID) 50 MCG tablet Take 50 mcg by mouth daily before breakfast. Take 50 mcg 1 hours before breakfast.  . OMEPRAZOLE PO Take by mouth daily.  Marland Kitchen PARoxetine (PAXIL) 20 MG tablet Take 20 mg by mouth at bedtime.   Marland Kitchen Propylene Glycol (SYSTANE BALANCE) 0.6 % SOLN Apply 1 drop to eye daily as needed (dry eyes).   No facility-administered encounter medications on file as of 01/15/2017.   :  Review of Systems:  Out of a complete 14 point review of systems, all are reviewed and negative with the exception of these symptoms as listed below: Review of Systems  Neurological:       Pt presents today to discuss her cpap. Pt "loves" her cpap. Pt has denies any new complaints today.    Objective:  Neurologic Exam  Physical Exam Physical Examination:   Vitals:   01/15/17 0936  BP: 135/76  Pulse: 75  Resp: 20   General Examination: The patient is a very pleasant 66 y.o. female in no acute distress. She appears well-developed and well-nourished and very well groomed.   HEENT: Normocephalic, atraumatic, pupils are equal, round and reactive to light and accommodation.Has corrective eyeglasses. Extraocular tracking is good without limitation to gaze excursion or nystagmus noted. Normal smooth pursuit is noted. Hearing is grossly intact. Face is symmetric with normal facial animation and normal facial sensation. Speech is very mildly dysarthric. There is no hypophonia. There is no lip, neck/head, jaw or voice tremor. Neck is supple with full range of passive and active  motion. There are no carotid bruits on auscultation.  Oropharynx exam reveals: mild mouth dryness, adequate dental hygiene and mild airway crowding. Mallampati is class I. Tongue protrudes centrally and palate elevates symmetrically. Tonsils are small.   Chest: Clear to auscultation without wheezing, rhonchi or crackles noted.  Heart: S1+S2+0, regular and normal without murmurs, rubs or gallops noted.   Abdomen: Soft, non-tender and non-distended with normal bowel sounds appreciated on auscultation.  Extremities: There is no pitting edema in the distal lower extremities bilaterally, but wears compression stockings bilaterally.  Skin: Warm and dry without trophic changes noted.  Musculoskeletal: exam reveals no obvious joint deformities, tenderness or joint swelling or erythema.   Neurologically:  Mental status: The patient is awake, alert and oriented in all 4 spheres. Her immediate and remote memory, attention, language skills and fund of knowledge are mildly abnormal with also mild slowness in thinking. There is no evidence of aphasia, agnosia, apraxia or anomia. Speech is very mildly dysarthric. Thought process is linear. Mood is normal and affect is normal.  Cranial nerves II - XII are as described above under HEENT exam. In addition: shoulder shrug is normal with equal shoulder height noted. Motor exam: Normal bulk, strength and tone is noted. There is no drift, tremor or rebound. Romberg is negative. Reflexes are 2+ to 3+ throughout. Fine motor skills and coordination:  very mild difficulty in the upper extremities but no difficulty in the lower extremities with good heel to shin bilaterally.  Sensory exam: intact to light touch in the upper and lower extremities.  Gait, station and balance: She stands easily. No veering to one side is noted. No leaning to one side is noted. Posture is age-appropriate and stance is narrow based. Gait shows normal stride length and normal pace. No problems turning are noted. Tandem walk is good.   Assessment  and plan:   In summary, Barbara [name] is a very pleasant 66 y.o.-year old female with an underlying medical history of spina bifida, anxiety, reflux disease, breast cancer, thyroiditis, hyperlipidemia, reflux disease, cerebral amyloid angiopathy with hemorrhagic stroke in January 2017 and paroxysmal A. fib who resents for follow-up consultationof her obstructive sleep apnea, well established on CPAP therapy. She had a baseline sleep study in April 2017 and a CPAP titration study in June 2017. Since then she has been on CPAP therapy of 8 cm. She is fully compliant with treatment and indicates ongoing good results. I increased her pressure from 7 cm to 8 cm about 6 months ago and her sleep apnea residual index is good at this time, physical exam is stable, slightly improved even from the neurological standpoint. I asked her to be fully compliant with CPAP therapy and follow-up in one year, she can see one of our nurse practitioners at the time. I answered all her questions today and she was in agreement.  I spent 20 minutes in total face-to-face time with the patient, more than 50% of which was spent in counseling and coordination of care, reviewing test results, reviewing medication and discussing or reviewing the diagnosis of OSA, hemorrh stroke, the prognosis and treatment options. Pertinent laboratory and imaging test results that were available during this visit with the patient were reviewed by me and considered in my medical decision making (see chart for details).

## 2017-01-15 NOTE — Patient Instructions (Signed)

## 2017-01-19 DIAGNOSIS — G4733 Obstructive sleep apnea (adult) (pediatric): Secondary | ICD-10-CM | POA: Diagnosis not present

## 2017-01-24 DIAGNOSIS — G4733 Obstructive sleep apnea (adult) (pediatric): Secondary | ICD-10-CM | POA: Diagnosis not present

## 2017-02-24 DIAGNOSIS — G4733 Obstructive sleep apnea (adult) (pediatric): Secondary | ICD-10-CM | POA: Diagnosis not present

## 2017-03-03 DIAGNOSIS — L570 Actinic keratosis: Secondary | ICD-10-CM | POA: Diagnosis not present

## 2017-03-03 DIAGNOSIS — L719 Rosacea, unspecified: Secondary | ICD-10-CM | POA: Diagnosis not present

## 2017-03-03 DIAGNOSIS — L814 Other melanin hyperpigmentation: Secondary | ICD-10-CM | POA: Diagnosis not present

## 2017-03-05 ENCOUNTER — Other Ambulatory Visit: Payer: Self-pay | Admitting: Oncology

## 2017-03-05 DIAGNOSIS — C50411 Malignant neoplasm of upper-outer quadrant of right female breast: Secondary | ICD-10-CM

## 2017-03-18 ENCOUNTER — Ambulatory Visit (HOSPITAL_BASED_OUTPATIENT_CLINIC_OR_DEPARTMENT_OTHER): Payer: PPO | Admitting: Oncology

## 2017-03-18 ENCOUNTER — Ambulatory Visit (HOSPITAL_BASED_OUTPATIENT_CLINIC_OR_DEPARTMENT_OTHER): Payer: PPO

## 2017-03-18 ENCOUNTER — Other Ambulatory Visit (HOSPITAL_BASED_OUTPATIENT_CLINIC_OR_DEPARTMENT_OTHER): Payer: PPO

## 2017-03-18 VITALS — BP 149/77 | HR 71 | Temp 98.3°F | Resp 18 | Ht 66.0 in | Wt 148.3 lb

## 2017-03-18 DIAGNOSIS — C50919 Malignant neoplasm of unspecified site of unspecified female breast: Secondary | ICD-10-CM | POA: Diagnosis not present

## 2017-03-18 DIAGNOSIS — C50411 Malignant neoplasm of upper-outer quadrant of right female breast: Secondary | ICD-10-CM

## 2017-03-18 DIAGNOSIS — M8580 Other specified disorders of bone density and structure, unspecified site: Secondary | ICD-10-CM | POA: Diagnosis not present

## 2017-03-18 DIAGNOSIS — Z170 Estrogen receptor positive status [ER+]: Secondary | ICD-10-CM

## 2017-03-18 DIAGNOSIS — Z79811 Long term (current) use of aromatase inhibitors: Secondary | ICD-10-CM | POA: Diagnosis not present

## 2017-03-18 LAB — CBC WITH DIFFERENTIAL/PLATELET
BASO%: 0.9 % (ref 0.0–2.0)
Basophils Absolute: 0 10*3/uL (ref 0.0–0.1)
EOS%: 2.1 % (ref 0.0–7.0)
Eosinophils Absolute: 0.1 10*3/uL (ref 0.0–0.5)
HCT: 41.6 % (ref 34.8–46.6)
HGB: 14.3 g/dL (ref 11.6–15.9)
LYMPH%: 35.8 % (ref 14.0–49.7)
MCH: 30.7 pg (ref 25.1–34.0)
MCHC: 34.3 g/dL (ref 31.5–36.0)
MCV: 89.6 fL (ref 79.5–101.0)
MONO#: 0.3 10*3/uL (ref 0.1–0.9)
MONO%: 6.6 % (ref 0.0–14.0)
NEUT%: 54.6 % (ref 38.4–76.8)
NEUTROS ABS: 2.8 10*3/uL (ref 1.5–6.5)
Platelets: 255 10*3/uL (ref 145–400)
RBC: 4.64 10*6/uL (ref 3.70–5.45)
RDW: 13 % (ref 11.2–14.5)
WBC: 5.1 10*3/uL (ref 3.9–10.3)
lymph#: 1.8 10*3/uL (ref 0.9–3.3)

## 2017-03-18 LAB — COMPREHENSIVE METABOLIC PANEL
ALT: 11 U/L (ref 0–55)
AST: 13 U/L (ref 5–34)
Albumin: 4.2 g/dL (ref 3.5–5.0)
Alkaline Phosphatase: 71 U/L (ref 40–150)
Anion Gap: 9 mEq/L (ref 3–11)
BILIRUBIN TOTAL: 0.61 mg/dL (ref 0.20–1.20)
BUN: 21.2 mg/dL (ref 7.0–26.0)
CO2: 23 meq/L (ref 22–29)
CREATININE: 0.9 mg/dL (ref 0.6–1.1)
Calcium: 9.8 mg/dL (ref 8.4–10.4)
Chloride: 105 mEq/L (ref 98–109)
EGFR: 63 mL/min/{1.73_m2} — ABNORMAL LOW (ref >90)
GLUCOSE: 97 mg/dL (ref 70–140)
Potassium: 4.2 mEq/L (ref 3.5–5.1)
SODIUM: 138 meq/L (ref 136–145)
Total Protein: 7.5 g/dL (ref 6.4–8.3)

## 2017-03-18 MED ORDER — SODIUM CHLORIDE 0.9 % IV SOLN
Freq: Once | INTRAVENOUS | Status: AC
  Administered 2017-03-18: 11:00:00 via INTRAVENOUS

## 2017-03-18 MED ORDER — ZOLEDRONIC ACID 4 MG/100ML IV SOLN
4.0000 mg | Freq: Once | INTRAVENOUS | Status: AC
  Administered 2017-03-18: 4 mg via INTRAVENOUS
  Filled 2017-03-18: qty 100

## 2017-03-18 NOTE — Patient Instructions (Signed)

## 2017-03-18 NOTE — Progress Notes (Signed)
ID: Barbara [name] OB: [birth year]  MR#: [id number]  WPY#:[id number]  PCP: [name], MD GYN:  Felipa Emory SU: [facility] OTHER KN:[id number] Blair Heys  CHIEF COMPLAINT: Right Breast Cancer  CURRENT TREATMENT: Anastrozole, zolendronate  BREAST CANCER HISTORY: From the original intake note:  The patient has a very strong family history for breast cancer, although she has tested  negative for the BRCA mutations (as have 2 of her sisters). She had been receiving breast MRI in addition to mammography, but had not had this test performed since October of 2010. More recently, 07/22/2013, routine screening mammography with tomography at [facility] showed no worrisome findings in the setting of extremely dense breasts.  MRI of the breast 09/08/2013, however, showed in the right upper outer quadrant an irregular mass measuring 1.7 cm. This was new as compared to the prior MRI from 2010. There were no abnormal lymph nodes and no other areas of concern in either breast. 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 The MIB-1 was 25%.  The patient's subsequent history is as detailed below  INTERVAL HISTORY: Barbara [name] returns today for follow-up of her estrogen receptor positive breast cancer. The interval history is generally unremarkable. She continues to make progress on her neurologic problems. She exercises chiefly by walking. There has been no falls. She denies any unusual headaches, visual changes, dizziness, gait imbalance, or problems with nausea or vomiting.  She tolerates the anastrozole well. Hot flashes and vaginal dryness are not a major issue. She never developed the arthralgias or myalgias that many patients can experience on this medication. She obtains it at a good  price.  REVIEW OF SYSTEMS: Barbara [name] feels a little anxious at times. She specifically denies any dental problems and has not had any significant dental work other than hygiene since her last visit. Otherwise a detailed review of systems today was entirely stable.  PAST MEDICAL HISTORY: Past Medical History:  Diagnosis Date  . Anxiety   . Breast cancer ([place])   . GERD (gastroesophageal reflux disease)   . Hashimoto's thyroiditis    with goiter  . Headache(784.0)    hx migraines  . Hypercholesteremia   . Spina bifida ([place])    [place] L5  . 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  Narrowing of the 4th and 5th lumbar interspaces.   . Stroke ([place]) 11/2015  . Varicose vein     PAST SURGICAL HISTORY: Past Surgical History:  Procedure Laterality Date  . BREAST RECONSTRUCTION WITH PLACEMENT OF TISSUE EXPANDER AND FLEX HD (ACELLULAR HYDRATED DERMIS) Bilateral 10/13/2013   Procedure: PLACEMENT OF BILATERAL TISSUE EXPANDER FOR BREAST RECONSTRUCTION ;  Surgeon: Crissie Reese, MD;  Location: [place];  Service: Plastics;  Laterality: Bilateral;  . CYSTOSCOPY WITH URETHRAL CARUNCLE    . HERNIA REPAIR  1976   Left groin  . MASTECTOMY W/ SENTINEL NODE BIOPSY Bilateral 10/13/2013   Procedure:  BILATERAL MASTECTOMY WITH RIGHT SENTINEL LYMPH NODE BIOPSY;  Surgeon: Merrie Roof, MD;  Location: [place];  Service: General;  Laterality: Bilateral;  . PORT-A-CATH REMOVAL Left 12/21/2014   Procedure: MINOR REMOVAL OF PORT-A-CATH;  Surgeon: Autumn Messing III, MD;  Location: [place];  Service: General;  Laterality: Left;  . PORTACATH PLACEMENT Left 10/13/2013  Procedure: INSERTION PORT-A-CATH;  Surgeon: Merrie Roof, MD;  Location: [place];  Service: General;  Laterality: Left;  . TUBAL LIGATION    . WISDOM TOOTH EXTRACTION  1990    FAMILY HISTORY Family History  Problem Relation Age of Onset  . Breast cancer Mother 42  . Lung cancer  Father   . Cancer Father   . Heart attack Father   . Breast cancer Sister 20    Bilateral breast cancer with 2nd dx at 63  . Breast cancer Maternal Aunt 52  . Breast cancer Sister 62  . Lung cancer Maternal Uncle   . Leukemia Paternal Uncle   . Melanoma Brother 52    on ear  . Prostate cancer Brother 76  . Cancer Cousin     maternal cousin with cancer - NOS  . Hyperthyroidism Daughter   The patient's mother was diagnosed with breast cancer the age of 41. She died at the age of 41. The patient's father died at the age of 95, with a history of lung cancer. The patient had 2 brothers, and 2 sisters. The patient's sister [name] has a history of bilateral breast cancer in her sister [name] a history of left breast cancer. Both have been tested and are found to be negative for a BRCA one or 2 mutation. The patient's mother's sister also had a history of breast cancer. The patient herself has been tested for the BRCA mutation as well as through [facility]. No mutation has been found   GYNECOLOGIC HISTORY:  Menarche age 10, first live birth age 30. The patient is GX P2. She stopped having periods in her early 61s. She did not take hormone replacement.  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 Her husband Ed used to work for [employer]. Daughter Barbara [name] works as a Music therapist in [place]. Son Barbara [name] is a drug representative for [employer] (diabetes). His wife, and December, is a Ship broker in [place] emergency room's. The patient has 5 grandchildren. She at tends [place]    ADVANCED DIRECTIVES:  In place   HEALTH MAINTENANCE:  (Updated 02/10/2014) Social History  Substance Use Topics  . Smoking status: Never Smoker  . Smokeless tobacco: Never Used  . Alcohol use 0.0 oz/week     Comment: Rare     Colonoscopy: 2013/ [place]  PAP: 2013/Dr. Sabra Heck  Bone density:  2013/osteopenia  Lipid panel: Not on file/Dr. Laurann Montana   Allergies  Allergen Reactions  . Buprenex [Buprenorphine] Nausea And Vomiting and Other (See Comments)    Oversedation  . Simvastatin Other (See Comments)    Myalgias   . Codeine Hives, Itching and Rash    Current Outpatient Prescriptions  Medication Sig Dispense Refill  . Acetaminophen (TYLENOL PO) Take by mouth as needed (dosage unspecified).    Marland Kitchen anastrozole (ARIMIDEX) 1 MG tablet Take 1 tablet (1 mg total) by mouth daily. 90 tablet 3  . anastrozole (ARIMIDEX) 1 MG tablet Take 1 tablet by mouth daily 90 tablet 2  . atorvastatin (LIPITOR) 20 MG tablet Take 20 mg by mouth daily.    Marland Kitchen BIOTIN PO Take 5,000 mcg by mouth every other day.     . cholecalciferol 5000 UNITS TABS Take 1 tablet (5,000 Units total) by mouth daily. 90 tablet 4  . fexofenadine (ALLEGRA) 180 MG tablet Take 180 mg by mouth as needed.     . Fluticasone Propionate (  FLONASE NA) Place into the nose as needed.    Marland Kitchen levothyroxine (SYNTHROID, LEVOTHROID) 50 MCG tablet Take 50 mcg by mouth daily before breakfast. Take 50 mcg 1 hours before breakfast.    . OMEPRAZOLE PO Take by mouth daily.    Marland Kitchen PARoxetine (PAXIL) 20 MG tablet Take 20 mg by mouth at bedtime.     Marland Kitchen Propylene Glycol (SYSTANE BALANCE) 0.6 % SOLN Apply 1 drop to eye daily as needed (dry eyes).     No current facility-administered medications for this visit.     OBJECTIVE:  Middle-aged white womanWho appears stated age   66:   03/18/17 0958  BP: (!) 149/77  Pulse: 71  Resp: 18  Temp: 98.3 F (36.8 C)  Body mass index is 23.94 kg/m.   ECOG:  0 Filed Weights   03/18/17 0958  Weight: 148 lb 4.8 oz (67.3 kg)   Sclerae unicteric, pupils round and equal Oropharynx clear and moist No cervical or supraclavicular adenopathy Lungs no rales or rhonchi Heart regular rate and rhythm Abd soft, nontender, positive bowel sounds MSK no focal spinal tenderness, no upper extremity lymphedema Neuro:  nonfocal, well oriented, appropriate affect Breasts: Status post bilateral mastectomies. She has saline implants in place. There is no evidence of local recurrence. Both axillae are benign.  LAB RESULTS:   Lab Results  Component Value Date   WBC 5.1 03/18/2017   NEUTROABS 2.8 03/18/2017   HGB 14.3 03/18/2017   HCT 41.6 03/18/2017   MCV 89.6 03/18/2017   PLT 255 03/18/2017      Chemistry      Component Value Date/Time   NA 140 12/26/2015 0804   NA 138 10/03/2015 1018   K 4.0 12/26/2015 0804   K 4.7 10/03/2015 1018   CL 107 12/26/2015 0804   CO2 23 12/26/2015 0804   CO2 26 10/03/2015 1018   BUN 18 12/26/2015 0804   BUN 21.4 10/03/2015 1018   CREATININE 0.97 12/26/2015 0804   CREATININE 0.9 10/03/2015 1018      Component Value Date/Time   CALCIUM 9.2 12/26/2015 0804   CALCIUM 9.5 10/03/2015 1018   ALKPHOS 57 12/26/2015 0804   ALKPHOS 65 10/03/2015 1018   AST 16 12/26/2015 0804   AST 15 10/03/2015 1018   ALT 14 12/26/2015 0804   ALT 13 10/03/2015 1018   BILITOT 0.3 12/26/2015 0804   BILITOT 0.43 10/03/2015 1018      STUDIES: I [facility] density at [facility] 2016-06-25 found the T score to be -1.8   ASSESSMENT: 66 y.o. BRCA negative [place] woman   (1)  status post right breast biopsy 09/21/2013 for a clinical T1c N0, stage IA invasive ductal carcinoma, grade 2, triple positive, with a HER-2: CEP 17 ratio of 5.55 and an average copy number per cell of  13.05. The MIB-1-1 was 25%  (2) status post bilateral mastectomies 10/13/2013 showing:  (a) on the left, atypical ductal hyperplasia  (b) on the right, a pT1c pN0, stage IA invasive ductal carcinoma, grade 2  (c) completed bilateral saline implant reconstruction 07/28/2014  (3) received paclitaxel/trastuzumab, with paclitaxel given on days 1, 8, and 15 and trastuzumab given every 2 weeks on days 1 and 15 of each 28 day cycle, for 4 cycles (12 doses of paclitaxel) completed 03/24/2014  (4) continued trastuzumab every  three weeks through 12/01/2014   (a) echocardiogram 10/02/2014 showed a well preserved ejection fraction  (5) The patient participated in the Star study remotely, completing 5 years of tamoxifen  in the late 1990s  (6) started anastrozole May 2015  (a) bone density 06/09/2014 showed a T score of -1.6 (decreased from prior)  (b) zolendronate started 03/21/2015, to be repeated yearly  (c) bone density July 2017 showed a T score of -1.8.   PLAN: Skyler is now 3-1/2 years out from definitive surgery for her breast cancer with no evidence of disease recurrence. His is very favorable.  She continues to tolerate anastrozole well. The plan will be to continue that through April 2020.  She also tolerates zolendronate without any side effects that she is aware of. She will receive a dose today.  Otherwise Captola will return to see me in one year. She knows to call for any problems that may develop before that visit.   Chauncey Cruel, MD   03/18/2017 10:11 AM

## 2017-03-19 LAB — KAPPA/LAMBDA LIGHT CHAINS
IG KAPPA FREE LIGHT CHAIN: 18.6 mg/L (ref 3.3–19.4)
Ig Lambda Free Light Chain: 14 mg/L (ref 5.7–26.3)
Kappa/Lambda FluidC Ratio: 1.33 (ref 0.26–1.65)

## 2017-03-20 LAB — PROTEIN ELECTROPHORESIS, SERUM
A/G RATIO SPE: 1.5 (ref 0.7–1.7)
ALPHA 2: 0.7 g/dL (ref 0.4–1.0)
Albumin: 4.3 g/dL (ref 2.9–4.4)
Alpha 1: 0.1 g/dL (ref 0.0–0.4)
BETA: 0.9 g/dL (ref 0.7–1.3)
GLOBULIN, TOTAL: 2.9 g/dL (ref 2.2–3.9)
Gamma Globulin: 1.2 g/dL (ref 0.4–1.8)
Total Protein: 7.2 g/dL (ref 6.0–8.5)

## 2017-03-26 DIAGNOSIS — G4733 Obstructive sleep apnea (adult) (pediatric): Secondary | ICD-10-CM | POA: Diagnosis not present

## 2017-04-26 DIAGNOSIS — G4733 Obstructive sleep apnea (adult) (pediatric): Secondary | ICD-10-CM | POA: Diagnosis not present

## 2017-05-11 DIAGNOSIS — L719 Rosacea, unspecified: Secondary | ICD-10-CM | POA: Diagnosis not present

## 2017-05-11 DIAGNOSIS — L814 Other melanin hyperpigmentation: Secondary | ICD-10-CM | POA: Diagnosis not present

## 2017-05-26 DIAGNOSIS — G4733 Obstructive sleep apnea (adult) (pediatric): Secondary | ICD-10-CM | POA: Diagnosis not present

## 2017-05-26 DIAGNOSIS — Z86010 Personal history of colonic polyps: Secondary | ICD-10-CM | POA: Diagnosis not present

## 2017-05-26 DIAGNOSIS — K5730 Diverticulosis of large intestine without perforation or abscess without bleeding: Secondary | ICD-10-CM | POA: Diagnosis not present

## 2017-06-19 DIAGNOSIS — G4733 Obstructive sleep apnea (adult) (pediatric): Secondary | ICD-10-CM | POA: Diagnosis not present

## 2017-06-19 DIAGNOSIS — I480 Paroxysmal atrial fibrillation: Secondary | ICD-10-CM | POA: Diagnosis not present

## 2017-06-19 DIAGNOSIS — M8588 Other specified disorders of bone density and structure, other site: Secondary | ICD-10-CM | POA: Diagnosis not present

## 2017-06-19 DIAGNOSIS — E559 Vitamin D deficiency, unspecified: Secondary | ICD-10-CM | POA: Diagnosis not present

## 2017-06-19 DIAGNOSIS — H6121 Impacted cerumen, right ear: Secondary | ICD-10-CM | POA: Diagnosis not present

## 2017-06-19 DIAGNOSIS — C50911 Malignant neoplasm of unspecified site of right female breast: Secondary | ICD-10-CM | POA: Diagnosis not present

## 2017-06-19 DIAGNOSIS — I619 Nontraumatic intracerebral hemorrhage, unspecified: Secondary | ICD-10-CM | POA: Diagnosis not present

## 2017-06-19 DIAGNOSIS — E039 Hypothyroidism, unspecified: Secondary | ICD-10-CM | POA: Diagnosis not present

## 2017-06-19 DIAGNOSIS — Z1389 Encounter for screening for other disorder: Secondary | ICD-10-CM | POA: Diagnosis not present

## 2017-06-19 DIAGNOSIS — Z0000 Encounter for general adult medical examination without abnormal findings: Secondary | ICD-10-CM | POA: Diagnosis not present

## 2017-06-19 DIAGNOSIS — J309 Allergic rhinitis, unspecified: Secondary | ICD-10-CM | POA: Diagnosis not present

## 2017-06-19 DIAGNOSIS — F329 Major depressive disorder, single episode, unspecified: Secondary | ICD-10-CM | POA: Diagnosis not present

## 2017-06-19 DIAGNOSIS — E7800 Pure hypercholesterolemia, unspecified: Secondary | ICD-10-CM | POA: Diagnosis not present

## 2017-08-04 ENCOUNTER — Ambulatory Visit: Payer: PPO | Admitting: Diagnostic Neuroimaging

## 2017-08-12 ENCOUNTER — Encounter: Payer: Self-pay | Admitting: Diagnostic Neuroimaging

## 2017-08-12 ENCOUNTER — Ambulatory Visit (INDEPENDENT_AMBULATORY_CARE_PROVIDER_SITE_OTHER): Payer: PPO | Admitting: Diagnostic Neuroimaging

## 2017-08-12 VITALS — BP 125/67 | HR 71 | Ht 66.0 in | Wt 151.6 lb

## 2017-08-12 DIAGNOSIS — E854 Organ-limited amyloidosis: Secondary | ICD-10-CM | POA: Diagnosis not present

## 2017-08-12 DIAGNOSIS — G4733 Obstructive sleep apnea (adult) (pediatric): Secondary | ICD-10-CM | POA: Diagnosis not present

## 2017-08-12 DIAGNOSIS — I680 Cerebral amyloid angiopathy: Secondary | ICD-10-CM

## 2017-08-12 DIAGNOSIS — Z9989 Dependence on other enabling machines and devices: Secondary | ICD-10-CM | POA: Diagnosis not present

## 2017-08-12 DIAGNOSIS — R413 Other amnesia: Secondary | ICD-10-CM | POA: Diagnosis not present

## 2017-08-12 DIAGNOSIS — I611 Nontraumatic intracerebral hemorrhage in hemisphere, cortical: Secondary | ICD-10-CM
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
## 2017-08-18 DIAGNOSIS — G4733 Obstructive sleep apnea (adult) (pediatric): Secondary | ICD-10-CM | POA: Diagnosis not present

## 2017-09-17 DIAGNOSIS — H2513 Age-related nuclear cataract, bilateral: Secondary | ICD-10-CM | POA: Diagnosis not present

## 2017-10-12 IMAGING — MR MR HEAD WO/W CM
9 of 13 series · 33 of 48 positions shown · IV contrast (multihance)
Comparison: Prior CT from earlier the same day.

CLINICAL DATA: Initial evaluation for sudden onset dizziness.

EXAM:
MRI HEAD WITHOUT AND WITH CONTRAST
TECHNIQUE: Multiplanar, multiecho pulse sequences of the brain and surrounding
structures were obtained without and with intravenous contrast.
CONTRAST:  14mL MULTIHANCE GADOBENATE DIMEGLUMINE 529 MG/ML IV SOLN

[Series 4: DWI · axial · 3.0mm · 1.09mm/px · z∈[-99,+37]mm · 9 of 96 slices shown (1 of 4)]
[im 1/96]
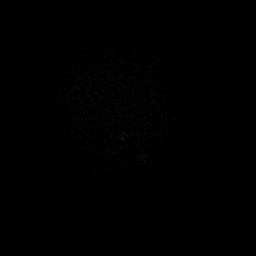
[im 12/96]
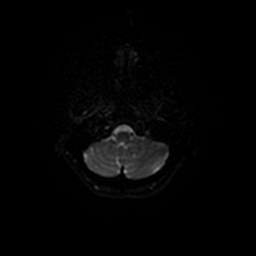
[im 24/96]
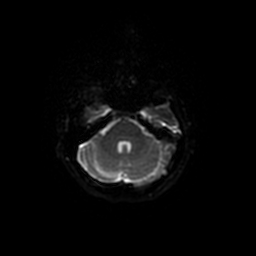
[im 36/96]
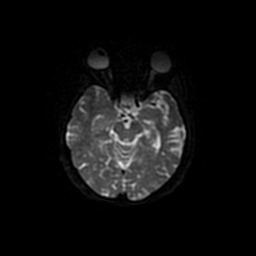
[im 48/96]
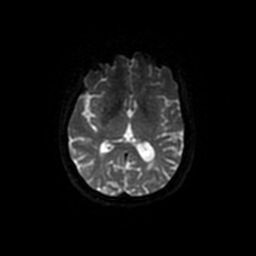
[im 60/96]
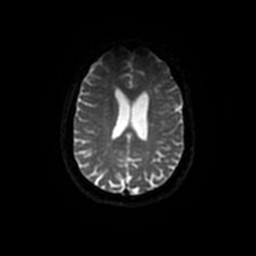
[im 72/96]
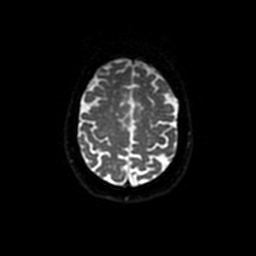
[im 84/96]
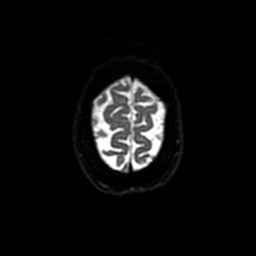
[im 96/96]
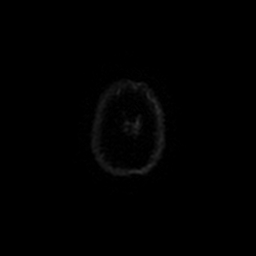

[Series 5: DWI · coronal · 5.0mm · 1.09mm/px · 7 of 74 slices shown (2 of 4)]
[im 1/74]
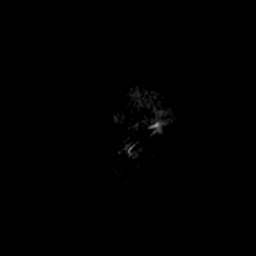
[im 13/74]
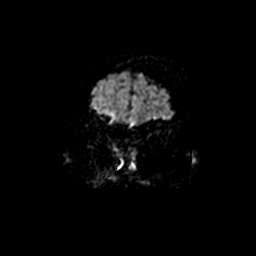
[im 25/74]
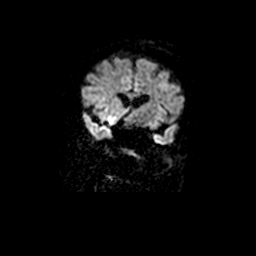
[im 37/74]
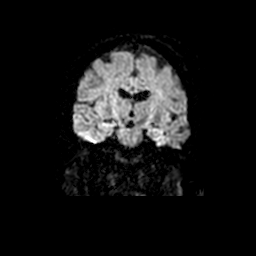
[im 49/74]
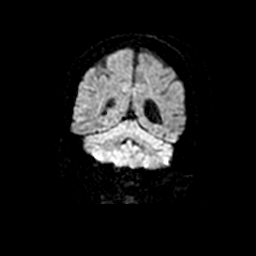
[im 61/74]
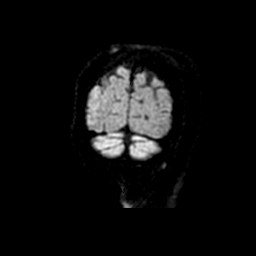
[im 74/74]
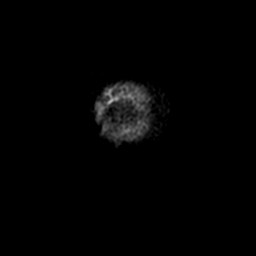

[Series 6: T2 · axial · 5.0mm · 0.43mm/px · z∈[-95,+50]mm · 2 of 24 slices shown]
[im 1/24]
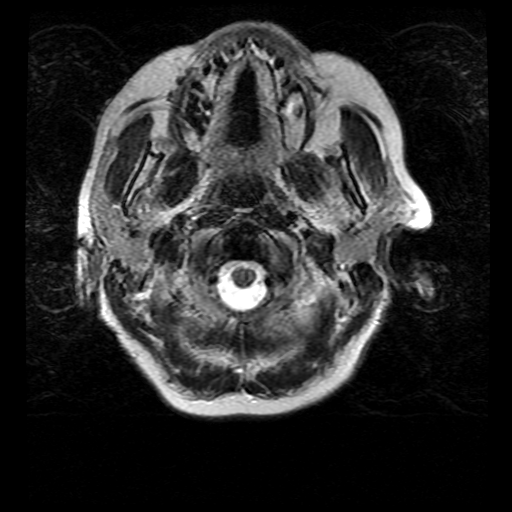
[im 24/24]
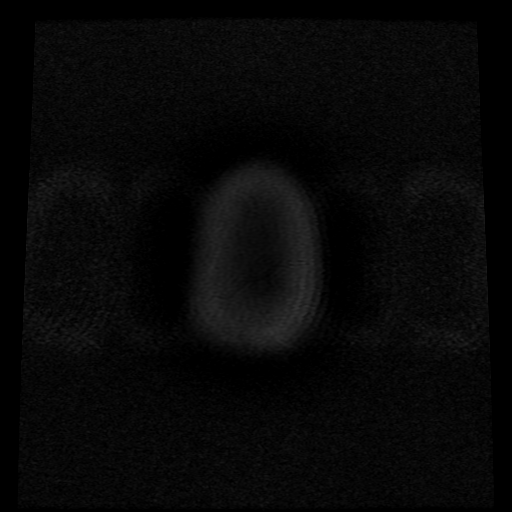

[Series 7: FLAIR · axial · 5.0mm · 0.43mm/px · z∈[-100,+56]mm · 2 of 24 slices shown]
[im 1/24]
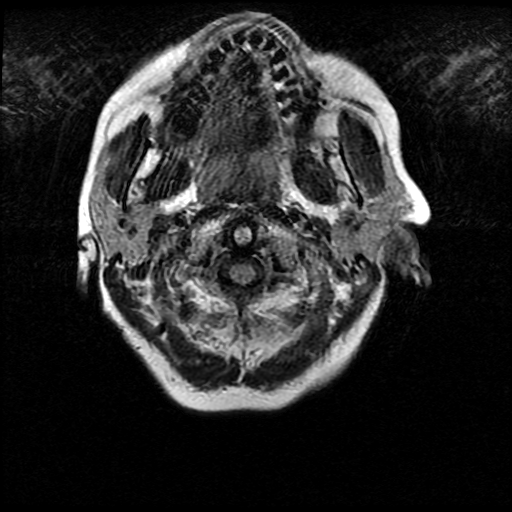
[im 24/24]
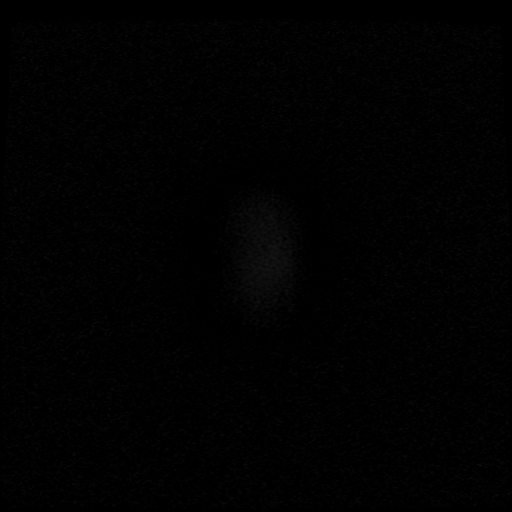

[Series 10: T2 post-contrast · coronal · 5.0mm · 0.45mm/px · 2 of 27 slices shown]
[im 1/27]
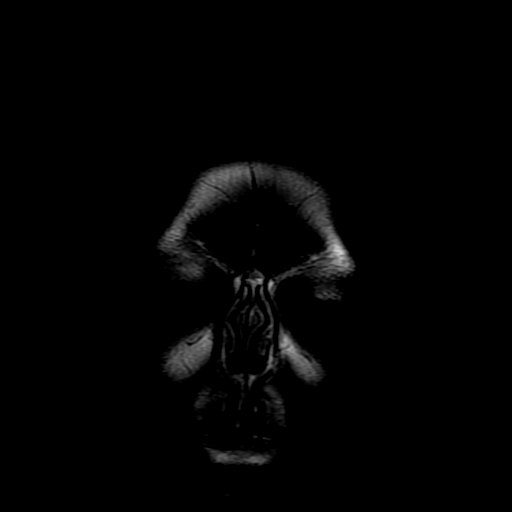
[im 27/27]
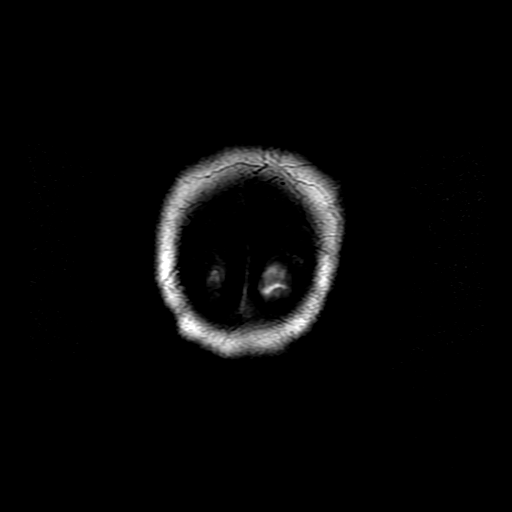

[Series 12: T1 post-contrast · coronal · 5.0mm · 0.45mm/px · 2 of 27 slices shown (1 of 2)]
[im 1/27]
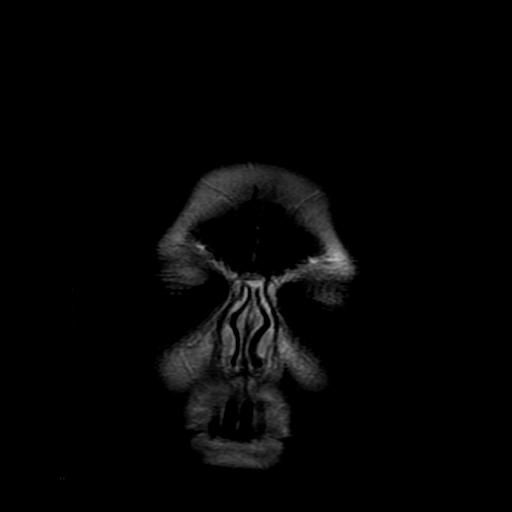
[im 27/27]
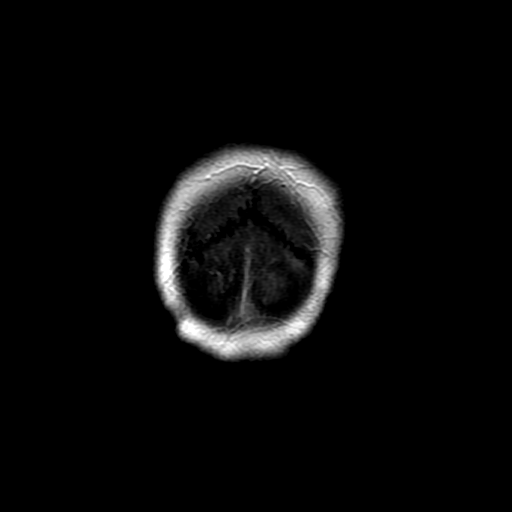

[Series 13: T1 post-contrast · sagittal · 5.0mm · 0.47mm/px · 2 of 24 slices shown (2 of 2)]
[im 1/24]
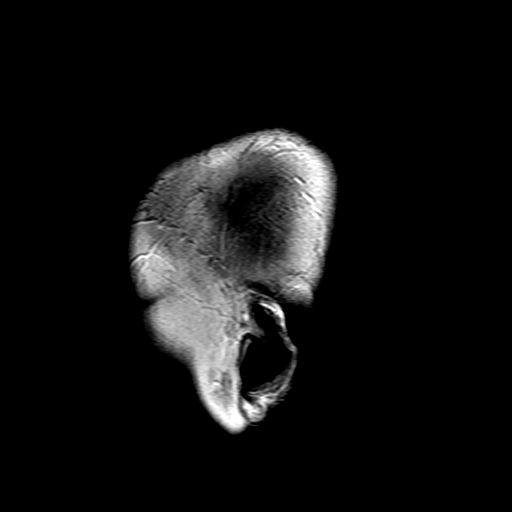
[im 24/24]
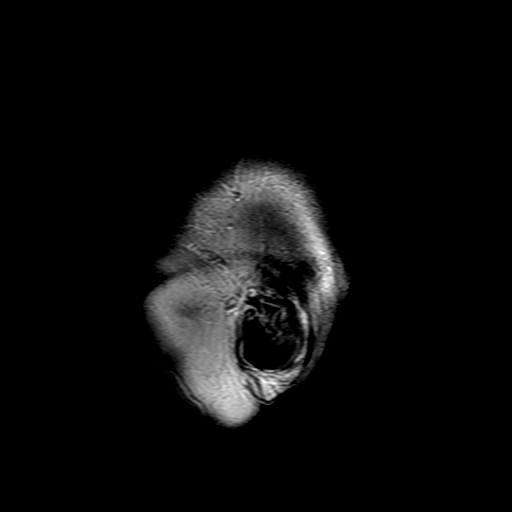

[Series 400: DWI · axial · 3.0mm · 1.09mm/px · z∈[-99,+37]mm · 4 of 48 slices shown (3 of 4)]
[im 1/48]
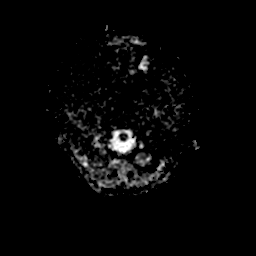
[im 16/48]
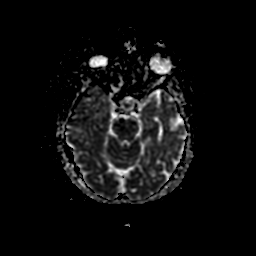
[im 32/48]
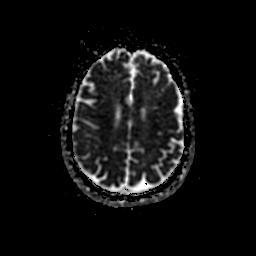
[im 48/48]
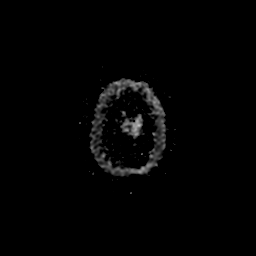

[Series 501: DWI · coronal · 5.0mm · 1.09mm/px · 3 of 37 slices shown (4 of 4)]
[im 1/37]
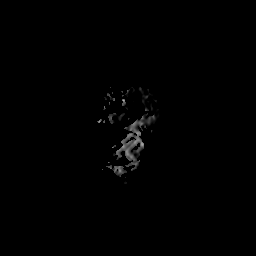
[im 19/37]
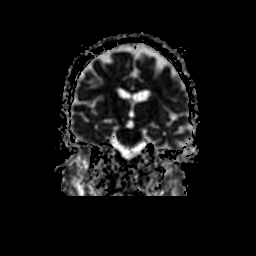
[im 37/37]
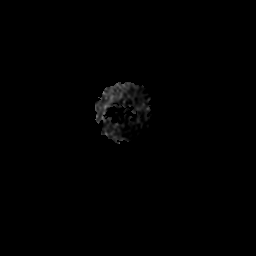

[33 of 48 positions shown; findings below may reference images not displayed]

FINDINGS: Study somewhat degraded by motion artifact.

Cerebral volume within normal limits for patient age. Patchy
T2/FLAIR hyperintensity within the periventricular and deep white
matter both cerebral hemispheres most consistent with chronic small
vessel ischemic disease, mild in nature.

Previously identified parenchymal hemorrhage again seen within the
right occipital lobe. This measures 14 mm on this exam without
significant mass effect. No underlying abnormal enhancement on post
gadolinium sequence.

Serpiginous FLAIR hyperintensity with layering within multiple
cortical sulci of the left frontal lobe with associated scattered
hypointense signal intensity on gradient echo sequence, most
consistent with acute subarachnoid hemorrhage. There is mildly
prominent asymmetric leptomeningeal enhancement within this region,
likely reactive. Minimal gyral swelling without significant cortical
edema or changes to suggest acute infection.

No evidence for acute ischemic infarct. Major intracranial vascular
flow voids are maintained. Gray-white matter differentiation
otherwise preserved. Few scattered foci of susceptibility artifact
within the bilateral cerebral hemispheres present, nonspecific, but
like related to small chronic underlying micro hemorrhages.

No mass effect or midline shift. No hydrocephalus. No mass lesion.
Other than the associated leptomeningeal enhancement within the left
frontal lobe. No other abnormal enhancement identified. Major dural
sinuses appear patent.

Craniocervical junction within normal limits. Mild degenerative
spondylolysis within the upper cervical spine without significant
stenosis.

Pituitary gland normal.  No acute abnormality about the orbits.

Retention cyst within the left maxillary sinus. Scattered mucosal
thickening and mild opacity within the ethmoidal air cells.

No mastoid effusion.  Inner ear structures grossly normal.

Bone marrow signal intensity within normal limits. No scalp soft
tissue abnormality.
IMPRESSION: 1. 14 mm acute parenchymal hemorrhage within the right occipital
lobe without significant mass effect. No underlying lesion
identified.
2. Scattered acute subarachnoid hemorrhage within the left frontal
lobe. Etiology of the right occipital and left frontal hemorrhage is
uncertain. Primary differential considerations include possible
occult trauma or underlying coagulopathy. Hemorrhage related to
underlying hypertension could also be considered. Presence of
subarachnoid blood is not typical of underlying amyloid angiopathy.
The major dural sinuses appear patent.
3. Scattered chronic micro hemorrhages involving both cerebral
hemispheres, nonspecific, but most commonly related to chronic
underlying hypertension.
4. Mild chronic small vessel ischemic disease.

## 2017-10-13 IMAGING — CR DG CHEST 1V PORT
1 series · 1 of 1 positions shown · non-contrast
Comparison: 10/13/2013.

CLINICAL DATA: Shortness of breath.

EXAM:
PORTABLE CHEST 1 VIEW

[AP]
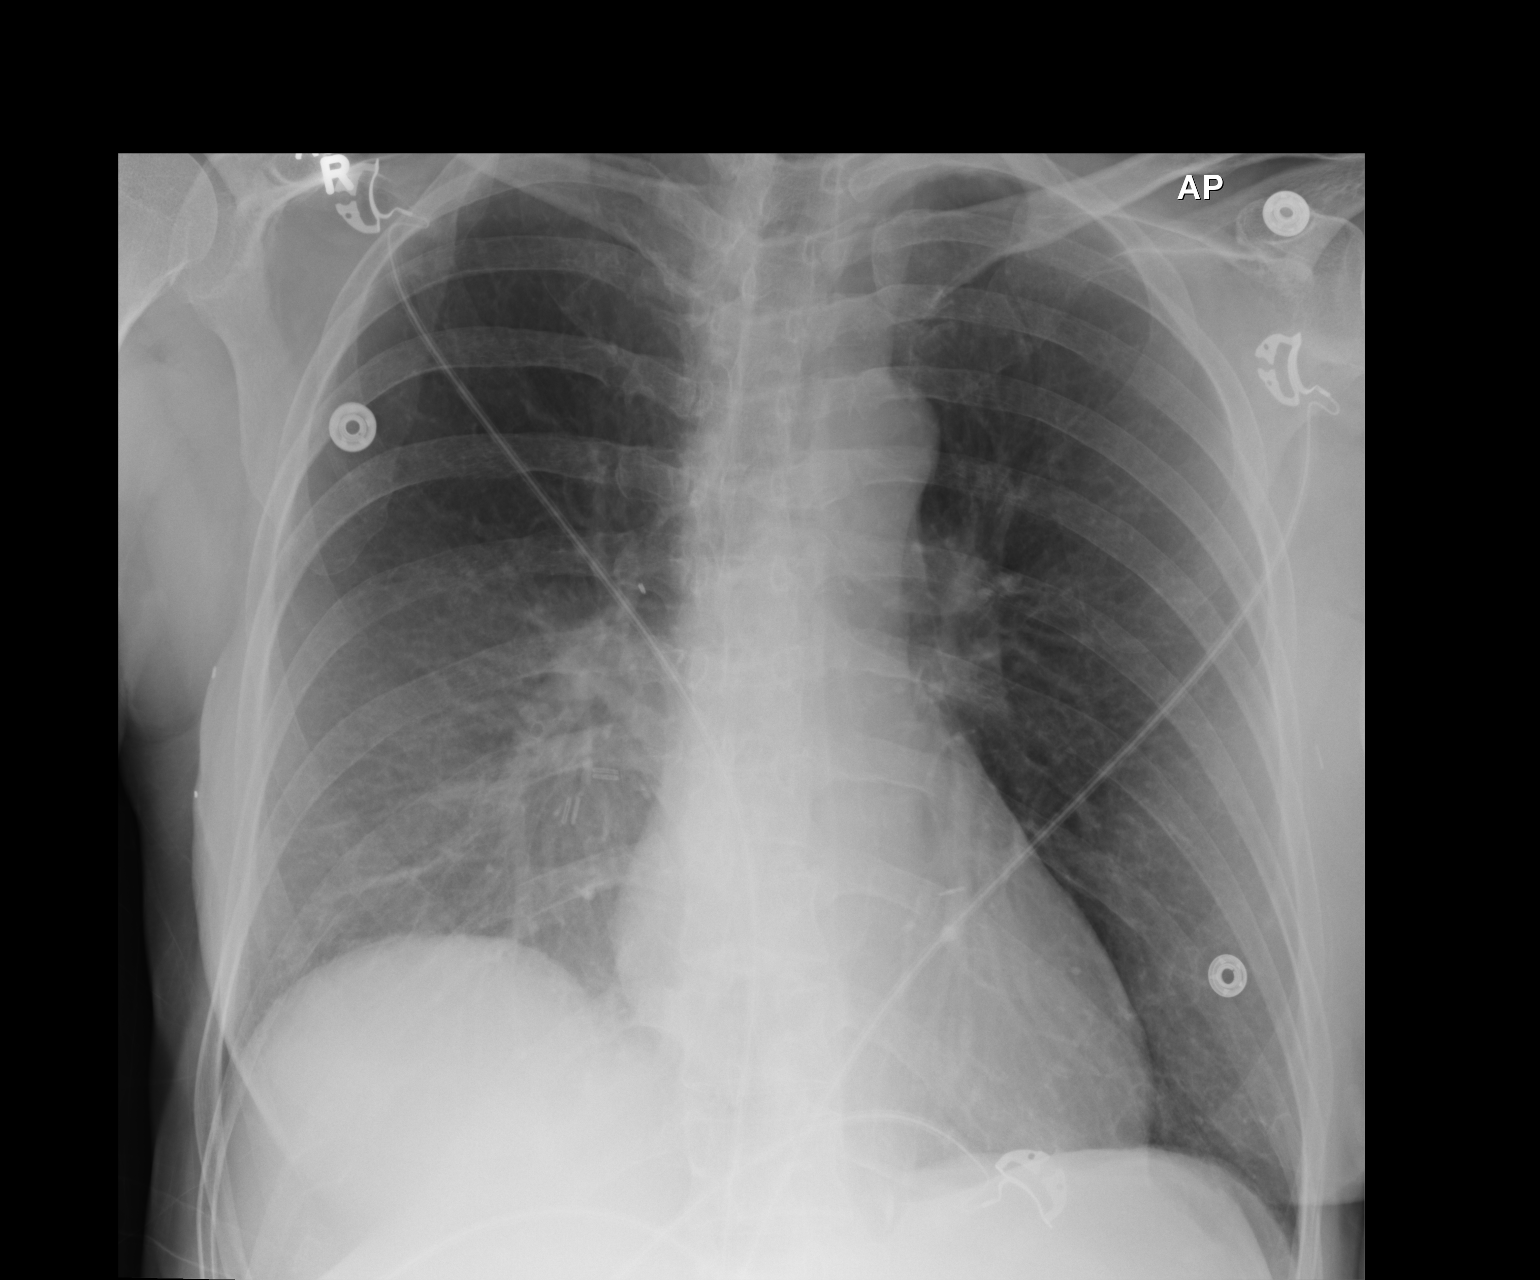

[1 of 1 positions shown; findings below may reference images not displayed]

FINDINGS: Interim removal of left Port-A-Cath. Interim removal of chest wall
surgical drains and tissue expanders. Surgical clips over the right
chest. Mild right perihilar infiltrate cannot be excluded. No
pleural effusion or pneumothorax. Heart size stable. No acute bony
abnormality .
IMPRESSION: 1. Interim removal of left Port-A-Cath. An removal of chest wall
surgical drains and tissue expanders.

2.  Cannot exclude mild right perihilar infiltrate.

## 2017-12-04 ENCOUNTER — Other Ambulatory Visit: Payer: Self-pay | Admitting: *Deleted

## 2017-12-04 DIAGNOSIS — C50411 Malignant neoplasm of upper-outer quadrant of right female breast: Secondary | ICD-10-CM

## 2017-12-04 MED ORDER — ANASTROZOLE 1 MG PO TABS: 1.0000 mg | ORAL_TABLET | Freq: Every day | ORAL | 2 refills | Status: DC

## 2017-12-15 ENCOUNTER — Other Ambulatory Visit: Payer: Self-pay

## 2017-12-15 ENCOUNTER — Telehealth: Payer: Self-pay

## 2017-12-15 DIAGNOSIS — C50411 Malignant neoplasm of upper-outer quadrant of right female breast: Secondary | ICD-10-CM

## 2017-12-15 MED ORDER — ANASTROZOLE 1 MG PO TABS: 1.0000 mg | ORAL_TABLET | Freq: Every day | ORAL | 2 refills | Status: DC

## 2017-12-15 NOTE — Telephone Encounter (Signed)
TC from patient in regards to she would like her Anastrozole changed to the [place] on [facility].  Completed change for her and called pt back to let her know her request was completed. No further needs at this time.

## 2017-12-21 DIAGNOSIS — Z853 Personal history of malignant neoplasm of breast: Secondary | ICD-10-CM | POA: Diagnosis not present

## 2017-12-21 DIAGNOSIS — E039 Hypothyroidism, unspecified: Secondary | ICD-10-CM | POA: Diagnosis not present

## 2017-12-21 DIAGNOSIS — I619 Nontraumatic intracerebral hemorrhage, unspecified: Secondary | ICD-10-CM | POA: Diagnosis not present

## 2017-12-21 DIAGNOSIS — E7800 Pure hypercholesterolemia, unspecified: Secondary | ICD-10-CM | POA: Diagnosis not present

## 2017-12-21 DIAGNOSIS — R413 Other amnesia: Secondary | ICD-10-CM | POA: Diagnosis not present

## 2017-12-21 DIAGNOSIS — G4733 Obstructive sleep apnea (adult) (pediatric): Secondary | ICD-10-CM | POA: Diagnosis not present

## 2018-01-17 ENCOUNTER — Telehealth: Payer: Self-pay | Admitting: *Deleted

## 2018-01-17 NOTE — Telephone Encounter (Signed)
LVM on home phone advising patient that NP will be out sick tomorrow. Advised she call the office tomorrow to reschedule her follow up. Apologized for her inconvenience, left office number.

## 2018-01-18 ENCOUNTER — Ambulatory Visit: Payer: PPO | Admitting: Adult Health

## 2018-01-18 ENCOUNTER — Telehealth: Payer: Self-pay | Admitting: *Deleted

## 2018-01-18 NOTE — Telephone Encounter (Signed)
LMVM for her on mobile, that 0730 appt available 01-20-18 if this works for her.  She is to call and see if still available and then if is to make appt.

## 2018-01-19 ENCOUNTER — Encounter: Payer: Self-pay | Admitting: Neurology

## 2018-01-20 ENCOUNTER — Ambulatory Visit (INDEPENDENT_AMBULATORY_CARE_PROVIDER_SITE_OTHER): Payer: PPO | Admitting: Adult Health

## 2018-01-20 ENCOUNTER — Encounter: Payer: Self-pay | Admitting: Adult Health

## 2018-01-20 VITALS — BP 134/70 | HR 60 | Ht 66.0 in | Wt 155.0 lb

## 2018-01-20 DIAGNOSIS — Z9989 Dependence on other enabling machines and devices: Secondary | ICD-10-CM | POA: Diagnosis not present

## 2018-01-20 DIAGNOSIS — G4733 Obstructive sleep apnea (adult) (pediatric): Secondary | ICD-10-CM | POA: Diagnosis not present

## 2018-01-20 NOTE — Progress Notes (Addendum)
PATIENT: Barbara [name] DOB: Nov 18, 1951  REASON FOR VISIT: follow up-obstructive sleep apnea on CPAP HISTORY FROM: patient  HISTORY OF PRESENT ILLNESS: Today 01/20/18 Barbara [name] is a 67 year old female with a history of obstructive sleep apnea on CPAP.  She returns today for follow-up.  Her CPAP download indicates that she use her machine 30 out of 30 days for compliance of 100%.  She uses her machine every night greater than 4 hours.  On average she uses her machine 8 hours and 32 minutes.  Her residual AHI is 7.7 on 8 cm of water with EPR of 3.  Her leak in the 95th percentile is 14.4 L/min.  Overall patient feels that the CPAP machine is working well for her.  She denies any new symptoms.  She returns today for evaluation.  HISTORY 01/15/2017 Rexene Alberts) I reviewed her CPAP compliance data from 12/14/2016 through 01/12/2017 which is a total of 30 days, during which time she used her machine every night with percent used days greater than 4 hours at 97%, indicating excellent compliance with an average usage of 8 hours and 16 minutes, residual AHI 3.3 per hour, leak acceptable but on the higher end with the 95th percentile at 20.2 L/m on a pressure of 8 cm with EPR of 3. She reports doing well with CPAP, feeling well as well. She has no new neurological symptoms, no recent illness, no recent medication changes. Has noted some allergy symptoms flare up, has new supplies but has not changed her filter in about 2 months. Has never changed her hose. She has found a better way to tighten her CPAP mask without causing pain and major leakage. She is quite pleased with how she is doing. She saw Dr. Leta Baptist on 08/04/2016 and was doing well from a stroke standpoint, a one-year checkup was suggested at the time. She has a routine follow-up appointment with her oncologist for breast cancer history in April.   REVIEW OF SYSTEMS: Out of a complete 14 system review of symptoms, the patient complains only of  the following symptoms, and all other reviewed systems are negative.  ALLERGIES: Allergies  Allergen Reactions  . Buprenex [Buprenorphine] Nausea And Vomiting and Other (See Comments)    Oversedation  . Simvastatin Other (See Comments)    Myalgias   . Codeine Hives, Itching and Rash    HOME MEDICATIONS: Outpatient Medications Prior to Visit  Medication Sig Dispense Refill  . Acetaminophen (TYLENOL PO) Take by mouth as needed (dosage unspecified).    Marland Kitchen anastrozole (ARIMIDEX) 1 MG tablet Take 1 tablet (1 mg total) by mouth daily. 90 tablet 2  . atorvastatin (LIPITOR) 20 MG tablet Take 20 mg by mouth daily.    Marland Kitchen BIOTIN PO Take 5,000 mcg by mouth every other day.     . cholecalciferol 5000 UNITS TABS Take 1 tablet (5,000 Units total) by mouth daily. 90 tablet 4  . fexofenadine (ALLEGRA) 180 MG tablet Take 180 mg by mouth as needed.     . Fluticasone Propionate (FLONASE NA) Place into the nose as needed.    Marland Kitchen levothyroxine (SYNTHROID, LEVOTHROID) 50 MCG tablet Take 50 mcg by mouth daily before breakfast. Take 50 mcg 1 hours before breakfast.    . OMEPRAZOLE PO Take by mouth daily.    Marland Kitchen PARoxetine (PAXIL) 20 MG tablet Take 20 mg by mouth at bedtime.     Marland Kitchen Propylene Glycol (SYSTANE BALANCE) 0.6 % SOLN Apply 1 drop to eye daily as needed (dry  eyes).     No facility-administered medications prior to visit.     PAST MEDICAL HISTORY: Past Medical History:  Diagnosis Date  . Anxiety   . Breast cancer ([place])   . GERD (gastroesophageal reflux disease)   . Hashimoto's thyroiditis    with goiter  . Headache(784.0)    hx migraines  . Hypercholesteremia   . Spina bifida ([place])    occutta L5  . 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  Narrowing of the 4th and 5th lumbar interspaces.   . Stroke ([place]) 11/2015  . Varicose vein     PAST SURGICAL HISTORY: Past Surgical History:  Procedure Laterality Date  . BREAST RECONSTRUCTION WITH  PLACEMENT OF TISSUE EXPANDER AND FLEX HD (ACELLULAR HYDRATED DERMIS) Bilateral 10/13/2013   Procedure: PLACEMENT OF BILATERAL TISSUE EXPANDER FOR BREAST RECONSTRUCTION ;  Surgeon: Crissie Reese, MD;  Location: [facility];  Service: Plastics;  Laterality: Bilateral;  . CYSTOSCOPY WITH URETHRAL CARUNCLE    . HERNIA REPAIR  1976   Left groin  . MASTECTOMY W/ SENTINEL NODE BIOPSY Bilateral 10/13/2013   Procedure:  BILATERAL MASTECTOMY WITH RIGHT SENTINEL LYMPH NODE BIOPSY;  Surgeon: Merrie Roof, MD;  Location: [facility];  Service: General;  Laterality: Bilateral;  . PORT-A-CATH REMOVAL Left 12/21/2014   Procedure: MINOR REMOVAL OF PORT-A-CATH;  Surgeon: Autumn Messing III, MD;  Location: [facility];  Service: General;  Laterality: Left;  . PORTACATH PLACEMENT Left 10/13/2013   Procedure: INSERTION PORT-A-CATH;  Surgeon: Merrie Roof, MD;  Location: [facility];  Service: General;  Laterality: Left;  . TUBAL LIGATION    . WISDOM TOOTH EXTRACTION  1990    FAMILY HISTORY: Family History  Problem Relation Age of Onset  . Breast cancer Mother 60  . Lung cancer Father   . Cancer Father   . Heart attack Father   . Breast cancer Sister 9       Bilateral breast cancer with 2nd dx at 72  . Breast cancer Maternal Aunt 52  . Breast cancer Sister 1  . Lung cancer Maternal Uncle   . Leukemia Paternal Uncle   . Melanoma Brother 52       on ear  . Prostate cancer Brother 6  . Cancer Cousin        maternal cousin with cancer - NOS  . Hyperthyroidism Daughter     SOCIAL HISTORY: Social History   Socioeconomic History  . Marital status: Married    Spouse name: Percell Miller  . Number of children: 2  . Years of education: LPN  . Highest education level: Not on file  Social Needs  . Financial resource strain: Not on file  . Food insecurity - worry: Not on file  . Food insecurity - inability: Not on file  . Transportation needs - medical: Not on file  . Transportation needs - non-medical: Not on  file  Occupational History  . Occupation: Retired    Fish farm manager: [employer]    Comment: [employer].  Tobacco Use  . Smoking status: Never Smoker  . Smokeless tobacco: Never Used  Substance and Sexual Activity  . Alcohol use: Yes    Alcohol/week: 0.0 oz    Comment: Rare  . Drug use: No  . Sexual activity: No    Partners: Male    Birth control/protection: Post-menopausal, Surgical    Comment: BTL  Other Topics Concern  . Not on  file  Social History Narrative   Patient lives at home with her spouse.   Caffeine Use:0.5-1 cup of coffee in the a.m.      PHYSICAL EXAM  Vitals:   01/20/18 0716  BP: 134/70  Pulse: 60  Weight: 155 lb (70.3 kg)  Height: 5\' 6"  (1.676 m)   Body mass index is 25.02 kg/m.  Generalized: Well developed, in no acute distress   Neurological examination  Mentation: Alert oriented to time, place, history taking. Follows all commands speech and language fluent Cranial nerve II-XII: Pupils were equal round reactive to light. Extraocular movements were full, visual field were full on confrontational test. Facial sensation and strength were normal. Uvula tongue midline. Head turning and shoulder shrug  were normal and symmetric.  Neck circumference 13-1/2 inches, Mallampati 2+ Motor: The motor testing reveals 5 over 5 strength of all 4 extremities. Good symmetric motor tone is noted throughout.  Sensory: Sensory testing is intact to soft touch on all 4 extremities. No evidence of extinction is noted.  Coordination: Cerebellar testing reveals good finger-nose-finger and heel-to-shin bilaterally.  Gait and station: Gait is normal. Tandem gait is normal. Romberg is negative. No drift is seen.  Reflexes: Deep tendon reflexes are symmetric and normal bilaterally.   DIAGNOSTIC DATA (LABS, IMAGING, TESTING) - I reviewed patient records, labs, notes, testing and imaging myself where available.  Lab Results  Component Value Date   WBC 5.1 03/18/2017   HGB  14.3 03/18/2017   HCT 41.6 03/18/2017   MCV 89.6 03/18/2017   PLT 255 03/18/2017      Component Value Date/Time   NA 138 03/18/2017 0942   K 4.2 03/18/2017 0942   CL 107 12/26/2015 0804   CO2 23 03/18/2017 0942   GLUCOSE 97 03/18/2017 0942   BUN 21.2 03/18/2017 0942   CREATININE 0.9 03/18/2017 0942   CALCIUM 9.8 03/18/2017 0942   PROT 7.2 03/18/2017 0942   PROT 7.5 03/18/2017 0942   ALBUMIN 4.2 03/18/2017 0942   AST 13 03/18/2017 0942   ALT 11 03/18/2017 0942   ALKPHOS 71 03/18/2017 0942   BILITOT 0.61 03/18/2017 0942   GFRNONAA 60 (L) 12/26/2015 0804   GFRAA >60 12/26/2015 0804   Lab Results  Component Value Date   CHOL 169 12/26/2015   HDL 40 (L) 12/26/2015   LDLCALC 101 (H) 12/26/2015   TRIG 142 12/26/2015   CHOLHDL 4.2 12/26/2015   Lab Results  Component Value Date   HGBA1C 5.8 (H) 12/26/2015   Lab Results  Component Value Date   VITAMINB12 325 12/26/2015   Lab Results  Component Value Date   TSH 3.698 12/24/2015      ASSESSMENT AND PLAN 67 y.o. year old female  has a past medical history of Anxiety, Breast cancer ([place]), GERD (gastroesophageal reflux disease), Hashimoto's thyroiditis, Headache(784.0), Hypercholesteremia, Spina bifida ([place]), Spondylarthritis, Stroke ([place]) (11/2015), and Varicose vein. here with:  1.  Obstructive sleep apnea on CPAP  The patient CPAP download shows excellent compliance however her residual AHI is slightly elevated.  For this reason I will increase her pressure to 9 cm of water.  She is encouraged to continue using the CPAP nightly.  She is advised that if her symptoms worsen or she develops new symptoms she should let us know.  He will follow-up in 1 year or sooner if needed.    Ward Givens, MSN, NP-C 01/20/2018, 7:26 AM [facility] [address], [place], [place] [address] [phone]  I  reviewed the above note and documentation by the Nurse Practitioner and agree with the history, physical  exam, assessment and plan as outlined above. I was immediately available for face-to-face consultation. Star Age, MD, PhD [facility])

## 2018-01-20 NOTE — Patient Instructions (Signed)
Your Plan:  Continue using CPAP nightly We will increase pressure to 9cm H20 If your symptoms worsen or you develop new symptoms please let us know.   Thank you for coming to see [place] at [facility]. I hope we have been able to provide you high quality care today.  You may receive a patient satisfaction survey over the next few weeks. We would appreciate your feedback and comments so that we may continue to improve ourselves and the health of our patients.

## 2018-01-20 NOTE — Progress Notes (Signed)
Fax confirmation received [facility] [phone]. sy

## 2018-01-29 ENCOUNTER — Telehealth: Payer: Self-pay | Admitting: Adult Health

## 2018-01-29 NOTE — Telephone Encounter (Signed)
Pt states that she received a survey in the mail from [facility] and it has NP Megan's name on it.  Pt states she does not feel she should feel out without speaking to NP Megan first.  She would like a call from [place] to discuss

## 2018-01-29 NOTE — Telephone Encounter (Signed)
Spoke to pt.  I relayed that the survey is based on visit to our office, and the provider Ward Givens, np you saw that day).  Your experience with [place].  She verbalized understanding.

## 2018-03-15 ENCOUNTER — Other Ambulatory Visit: Payer: Self-pay | Admitting: *Deleted

## 2018-03-15 DIAGNOSIS — C50411 Malignant neoplasm of upper-outer quadrant of right female breast: Secondary | ICD-10-CM

## 2018-03-15 MED ORDER — ANASTROZOLE 1 MG PO TABS: 1.0000 mg | ORAL_TABLET | Freq: Every day | ORAL | 2 refills | Status: DC

## 2018-03-15 MED ORDER — FEXOFENADINE HCL 180 MG PO TABS: 180.0000 mg | ORAL_TABLET | ORAL | 0 refills | Status: AC | PRN

## 2018-03-17 ENCOUNTER — Other Ambulatory Visit: Payer: Self-pay | Admitting: *Deleted

## 2018-03-17 DIAGNOSIS — R413 Other amnesia: Secondary | ICD-10-CM

## 2018-03-17 DIAGNOSIS — R7989 Other specified abnormal findings of blood chemistry: Secondary | ICD-10-CM

## 2018-03-17 DIAGNOSIS — Z170 Estrogen receptor positive status [ER+]: Principal | ICD-10-CM

## 2018-03-17 DIAGNOSIS — C50411 Malignant neoplasm of upper-outer quadrant of right female breast: Secondary | ICD-10-CM

## 2018-03-17 DIAGNOSIS — M85852 Other specified disorders of bone density and structure, left thigh: Secondary | ICD-10-CM

## 2018-03-17 NOTE — Progress Notes (Signed)
ID: Barbara [name] OB: 07/24/1951  MR#: [id number]  NWG#:[id number]  PCP: Kelton Pillar, MD GYN:  Felipa Emory SU: Star Age OTHER VH:QIONGE Blair Heys  CHIEF COMPLAINT: Right Breast Cancer  CURRENT TREATMENT: Anastrozole, zolendronate  BREAST CANCER HISTORY: From the original intake note:  The patient has a very strong family history for breast cancer, although she has tested  negative for the BRCA mutations (as have 2 of her sisters). She had been receiving breast MRI in addition to mammography, but had not had this test performed since October of 2010. More recently, 07/22/2013, routine screening mammography with tomography at [facility] showed no worrisome findings in the setting of extremely dense breasts.  MRI of the breast 09/08/2013, however, showed in the right upper outer quadrant an irregular mass measuring 1.7 cm. This was new as compared to the prior MRI from 2010. There were no abnormal lymph nodes and no other areas of concern in either breast. 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 The MIB-1 was 25%.  The patient's subsequent history is as detailed below  INTERVAL HISTORY: Barbara [name] returns today for follow-up of her estrogen receptor positive breast cancer accompanied by her husband. She continues on anastrozole, with good tolerance. She denies issues with hot flashes or vaginal dryness. She has dryness in her mouth.    She also receives zolendronate yearly with a dose due today. She tolerates this well.   REVIEW OF SYSTEMS: Barbara [name] reports that she loves being retired now. She loves gardening and spending time outside. She also enjoys walking when the weather is nice. She walks for about 1-2 per week. She denies unusual headaches, visual changes, nausea, vomiting, or  dizziness. There has been no unusual cough, phlegm production, or pleurisy. This been no change in bowel or bladder habits. She denies unexplained fatigue or unexplained weight loss, bleeding, rash, or fever. A detailed review of systems was otherwise stable.    PAST MEDICAL HISTORY: Past Medical History:  Diagnosis Date  . Anxiety   . Breast cancer ([place])   . GERD (gastroesophageal reflux disease)   . Hashimoto's thyroiditis    with goiter  . Headache(784.0)    hx migraines  . Hypercholesteremia   . Spina bifida ([place])    occutta L5  . 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  Narrowing of the 4th and 5th lumbar interspaces.   . Stroke ([place]) 11/2015  . Varicose vein     PAST SURGICAL HISTORY: Past Surgical History:  Procedure Laterality Date  . BREAST RECONSTRUCTION WITH PLACEMENT OF TISSUE EXPANDER AND FLEX HD (ACELLULAR HYDRATED DERMIS) Bilateral 10/13/2013   Procedure: PLACEMENT OF BILATERAL TISSUE EXPANDER FOR BREAST RECONSTRUCTION ;  Surgeon: Crissie Reese, MD;  Location: [facility];  Service: Plastics;  Laterality: Bilateral;  . CYSTOSCOPY WITH URETHRAL CARUNCLE    . HERNIA REPAIR  1976   Left groin  . MASTECTOMY W/ SENTINEL NODE BIOPSY Bilateral 10/13/2013   Procedure:  BILATERAL MASTECTOMY WITH RIGHT SENTINEL LYMPH NODE BIOPSY;  Surgeon: Merrie Roof, MD;  Location: [facility];  Service: General;  Laterality: Bilateral;  . PORT-A-CATH REMOVAL Left 12/21/2014   Procedure: MINOR REMOVAL OF PORT-A-CATH;  Surgeon: Autumn Messing III, MD;  Location: [facility];  Service: General;  Laterality: Left;  . PORTACATH PLACEMENT Left 10/13/2013   Procedure: INSERTION PORT-A-CATH;  Surgeon: Merrie Roof, MD;  Location: [facility];  Service: General;  Laterality: Left;  . TUBAL LIGATION    . WISDOM TOOTH EXTRACTION  1990    FAMILY HISTORY Family History  Problem Relation Age of Onset  . Breast cancer Mother 61  . Lung cancer Father    . Cancer Father   . Heart attack Father   . Breast cancer Sister 51       Bilateral breast cancer with 2nd dx at 67  . Breast cancer Maternal Aunt 52  . Breast cancer Sister 52  . Lung cancer Maternal Uncle   . Leukemia Paternal Uncle   . Melanoma Brother 52       on ear  . Prostate cancer Brother 14  . Cancer Cousin        maternal cousin with cancer - NOS  . Hyperthyroidism Daughter   The patient's mother was diagnosed with breast cancer the age of 24. She died at the age of 5. The patient's father died at the age of 103, with a history of lung cancer. The patient had 2 brothers, and 2 sisters. The patient's sister [name] has a history of bilateral breast cancer in her sister [name] a history of left breast cancer. Both have been tested and are found to be negative for a BRCA one or 2 mutation. The patient's mother's sister also had a history of breast cancer. The patient herself has been tested for the BRCA mutation as well as through [facility]. No mutation has been found   GYNECOLOGIC HISTORY:  Menarche age 69, first live birth age 27. The patient is GX P2. She stopped having periods in her early 19s. She did not take hormone replacement.  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 Her husband Ed used to work for [employer]. Daughter [name] works as a Music therapist in [place]. Son [name] is a drug representative for [employer] (diabetes). His wife, and December, is a Ship broker in [place] emergency room's. The patient has 5 grandchildren. She at tends [place]    ADVANCED DIRECTIVES:  In place   HEALTH MAINTENANCE:  (Updated 02/10/2014) Social History   Tobacco Use  . Smoking status: Never Smoker  . Smokeless tobacco: Never Used  Substance Use Topics  . Alcohol use: Yes    Alcohol/week: 0.0 oz    Comment: Rare  . Drug use: No     Colonoscopy: 2013/  [facility]  PAP: 2013/Dr. Sabra Heck  Bone density: 2013/osteopenia  Lipid panel: Not on file/Dr. Laurann [place]   Allergies  Allergen Reactions  . Buprenex [Buprenorphine] Nausea And Vomiting and Other (See Comments)    Oversedation  . Simvastatin Other (See Comments)    Myalgias   . Codeine Hives, Itching and Rash    Current Outpatient Medications  Medication Sig Dispense Refill  . Acetaminophen (TYLENOL PO) Take by mouth as needed (dosage unspecified).    Marland Kitchen anastrozole (ARIMIDEX) 1 MG tablet Take 1 tablet (1 mg total) by mouth daily. 90 tablet 2  . atorvastatin (LIPITOR) 20 MG tablet Take 20 mg by mouth daily.    Marland Kitchen BIOTIN PO Take 5,000 mcg by mouth every other day.     . cholecalciferol 5000 UNITS TABS Take 1 tablet (5,000 Units total) by mouth daily. 90 tablet 4  . fexofenadine (ALLEGRA) 180  MG tablet Take 1 tablet (180 mg total) by mouth as needed. 90 tablet 0  . Fluticasone Propionate (FLONASE NA) Place into the nose as needed.    Marland Kitchen levothyroxine (SYNTHROID, LEVOTHROID) 50 MCG tablet Take 50 mcg by mouth daily before breakfast. Take 50 mcg 1 hours before breakfast.    . OMEPRAZOLE PO Take by mouth daily.    Marland Kitchen PARoxetine (PAXIL) 20 MG tablet Take 20 mg by mouth at bedtime.     Marland Kitchen Propylene Glycol (SYSTANE BALANCE) 0.6 % SOLN Apply 1 drop to eye daily as needed (dry eyes).     No current facility-administered medications for this visit.     OBJECTIVE:  Middle-aged white woman no acute distress  Vitals:   03/18/18 1020  BP: 140/63  Pulse: 71  Resp: 18  Temp: 98.3 F (36.8 C)  SpO2: 100%  Body mass index is 24.23 kg/m.   ECOG:  1 Filed Weights   03/18/18 1020  Weight: 150 lb 1.6 oz (68.1 kg)   Sclerae unicteric, EOMs intact Oropharynx clear and moist No cervical or supraclavicular adenopathy Lungs no rales or rhonchi Heart regular rate and rhythm Abd soft, nontender, positive bowel sounds MSK no focal spinal tenderness, no upper extremity lymphedema Neuro: nonfocal, well  oriented, appropriate affect Breasts: Status post bilateral mastectomies with bilateral saline implants in place.  The cosmetic result is very good.  There is no evidence of local recurrence.  Both axillae are benign.  LAB RESULTS:   Lab Results  Component Value Date   WBC 4.7 03/18/2018   NEUTROABS 2.7 03/18/2018   HGB 13.1 03/18/2018   HCT 38.9 03/18/2018   MCV 90.4 03/18/2018   PLT 244 03/18/2018      Chemistry      Component Value Date/Time   NA 140 03/18/2018 0955   NA 138 03/18/2017 0942   K 4.2 03/18/2018 0955   K 4.2 03/18/2017 0942   CL 108 03/18/2018 0955   CO2 24 03/18/2018 0955   CO2 23 03/18/2017 0942   BUN 21 03/18/2018 0955   BUN 21.2 03/18/2017 0942   CREATININE 0.96 03/18/2018 0955   CREATININE 0.9 03/18/2017 0942      Component Value Date/Time   CALCIUM 9.3 03/18/2018 0955   CALCIUM 9.8 03/18/2017 0942   ALKPHOS 64 03/18/2018 0955   ALKPHOS 71 03/18/2017 0942   AST 14 03/18/2018 0955   AST 13 03/18/2017 0942   ALT 13 03/18/2018 0955   ALT 11 03/18/2017 0942   BILITOT 0.4 03/18/2018 0955   BILITOT 0.61 03/18/2017 0942      STUDIES: I [facility] density [facility] 06[facility]19[facility]16 found the T score to be -1.8  ASSESSMENT: 67 y.o. BRCA negative [place] woman   (1)  status post right breast biopsy 09/21/2013 for a clinical T1c N0, stage IA invasive ductal carcinoma, grade 2, triple positive, with a HER-2: CEP 17 ratio of 5.55 and an average copy number per cell of  13.05. The MIB-1-1 was 25%  (2) status post bilateral mastectomies 10/13/2013 showing:  (a) on the left, atypical ductal hyperplasia  (b) on the right, a pT1c pN0, stage IA invasive ductal carcinoma, grade 2  (c) completed bilateral saline implant reconstruction 07/28/2014  (3) received paclitaxel/trastuzumab, with paclitaxel given on days 1, 8, and 15 and trastuzumab given every 2 weeks on days 1 and 15 of each 28 day cycle, for 4 cycles (12 doses of paclitaxel) completed 03/24/2014  (4)  continued trastuzumab every three weeks through 12/01/2014   (  a) echocardiogram 10/02/2014 showed a well preserved ejection fraction  (5) The patient participated in the [facility] study remotely, completing 5 years of tamoxifen in the late 1990s  (6) started anastrozole May 2015  (a) bone density 06/09/2014 showed a T score of -1.6 (decreased from prior)  (b) zolendronate started 03/21/2015, to be repeated yearly  (c) bone density July 2017 showed a T score of -1.8.   PLAN: Mikka is now 4-1/2 years out from definitive surgery for breast cancer with no evidence of disease recurrence.  This is very favorable.  She continues on anastrozole with good tolerance and she has 1 more year to go before she can "graduate".  She is receiving zolendronate yearly to prevent worsening problems regarding bone density.  She will have a repeat DEXA scan this summer.  If that is significantly improved we will omit zolendronate next visit otherwise she will receive one last dose then.  In any case she will graduate from follow-up at her next visit with me, a year from now.   Magrinat, Virgie Dad, MD  03/18/18 10:44 AM Medical Oncology and Hematology [facility] [address] [place], [place] [address] Tel. [phone]    Fax. [phone]  This document serves as a record of services personally performed by Lurline Del, MD. It was created on his behalf by Sheron Nightingale, a trained medical scribe. The creation of this record is based on the scribe's personal observations and the provider's statements to them.   I have reviewed the above documentation for accuracy and completeness, and I agree with the above.

## 2018-03-18 ENCOUNTER — Inpatient Hospital Stay: Payer: PPO

## 2018-03-18 ENCOUNTER — Inpatient Hospital Stay: Payer: PPO | Attending: Oncology | Admitting: Oncology

## 2018-03-18 ENCOUNTER — Telehealth: Payer: Self-pay | Admitting: Oncology

## 2018-03-18 VITALS — BP 140/63 | HR 71 | Temp 98.3°F | Resp 18 | Ht 66.0 in | Wt 150.1 lb

## 2018-03-18 DIAGNOSIS — Z79811 Long term (current) use of aromatase inhibitors: Secondary | ICD-10-CM | POA: Insufficient documentation

## 2018-03-18 DIAGNOSIS — M8580 Other specified disorders of bone density and structure, unspecified site: Secondary | ICD-10-CM | POA: Insufficient documentation

## 2018-03-18 DIAGNOSIS — Z170 Estrogen receptor positive status [ER+]: Secondary | ICD-10-CM | POA: Diagnosis not present

## 2018-03-18 DIAGNOSIS — Z9221 Personal history of antineoplastic chemotherapy: Secondary | ICD-10-CM | POA: Insufficient documentation

## 2018-03-18 DIAGNOSIS — Z79899 Other long term (current) drug therapy: Secondary | ICD-10-CM | POA: Diagnosis not present

## 2018-03-18 DIAGNOSIS — C50411 Malignant neoplasm of upper-outer quadrant of right female breast: Secondary | ICD-10-CM | POA: Diagnosis not present

## 2018-03-18 DIAGNOSIS — R7989 Other specified abnormal findings of blood chemistry: Secondary | ICD-10-CM

## 2018-03-18 DIAGNOSIS — Z9013 Acquired absence of bilateral breasts and nipples: Secondary | ICD-10-CM | POA: Diagnosis not present

## 2018-03-18 DIAGNOSIS — R413 Other amnesia: Secondary | ICD-10-CM

## 2018-03-18 DIAGNOSIS — M85852 Other specified disorders of bone density and structure, left thigh: Secondary | ICD-10-CM

## 2018-03-18 LAB — CBC WITH DIFFERENTIAL (CANCER CENTER ONLY)
Basophils Absolute: 0.1 10*3/uL (ref 0.0–0.1)
Basophils Relative: 1 %
Eosinophils Absolute: 0.1 10*3/uL (ref 0.0–0.5)
Eosinophils Relative: 2 %
HCT: 38.9 % (ref 34.8–46.6)
Hemoglobin: 13.1 g/dL (ref 11.6–15.9)
LYMPHS ABS: 1.6 10*3/uL (ref 0.9–3.3)
LYMPHS PCT: 33 %
MCH: 30.3 pg (ref 25.1–34.0)
MCHC: 33.6 g/dL (ref 31.5–36.0)
MCV: 90.4 fL (ref 79.5–101.0)
MONO ABS: 0.3 10*3/uL (ref 0.1–0.9)
MONOS PCT: 7 %
Neutro Abs: 2.7 10*3/uL (ref 1.5–6.5)
Neutrophils Relative %: 57 %
Platelet Count: 244 10*3/uL (ref 145–400)
RBC: 4.31 MIL/uL (ref 3.70–5.45)
RDW: 13 % (ref 11.2–14.5)
WBC Count: 4.7 10*3/uL (ref 3.9–10.3)

## 2018-03-18 LAB — CMP (CANCER CENTER ONLY)
ALBUMIN: 3.9 g/dL (ref 3.5–5.0)
ALT: 13 U/L (ref 0–55)
ANION GAP: 8 (ref 3–11)
AST: 14 U/L (ref 5–34)
Alkaline Phosphatase: 64 U/L (ref 40–150)
BILIRUBIN TOTAL: 0.4 mg/dL (ref 0.2–1.2)
BUN: 21 mg/dL (ref 7–26)
CO2: 24 mmol/L (ref 22–29)
Calcium: 9.3 mg/dL (ref 8.4–10.4)
Chloride: 108 mmol/L (ref 98–109)
Creatinine: 0.96 mg/dL (ref 0.60–1.10)
GFR, Est AFR Am: >60 mL/min (ref >60)
GFR, Estimated: 59 mL/min — ABNORMAL LOW (ref >60)
GLUCOSE: 85 mg/dL (ref 70–140)
POTASSIUM: 4.2 mmol/L (ref 3.5–5.1)
SODIUM: 140 mmol/L (ref 136–145)
TOTAL PROTEIN: 6.9 g/dL (ref 6.4–8.3)

## 2018-03-18 MED ORDER — ANASTROZOLE 1 MG PO TABS: 1.0000 mg | ORAL_TABLET | Freq: Every day | ORAL | 4 refills | Status: DC

## 2018-03-18 MED ORDER — ZOLEDRONIC ACID 4 MG/100ML IV SOLN
4.0000 mg | Freq: Once | INTRAVENOUS | Status: AC
  Administered 2018-03-18: 4 mg via INTRAVENOUS
  Filled 2018-03-18: qty 100

## 2018-03-18 NOTE — Telephone Encounter (Signed)
Gave avs and calendar ° °

## 2018-03-18 NOTE — Patient Instructions (Signed)

## 2018-03-19 LAB — VITAMIN D 25 HYDROXY (VIT D DEFICIENCY, FRACTURES): Vit D, 25-Hydroxy: 71.4 ng/mL (ref 30.0–100.0)

## 2018-05-06 DIAGNOSIS — M79641 Pain in right hand: Secondary | ICD-10-CM | POA: Diagnosis not present

## 2018-06-01 DIAGNOSIS — M6740 Ganglion, unspecified site: Secondary | ICD-10-CM | POA: Diagnosis not present

## 2018-06-01 DIAGNOSIS — M79641 Pain in right hand: Secondary | ICD-10-CM | POA: Diagnosis not present

## 2018-06-01 DIAGNOSIS — G5601 Carpal tunnel syndrome, right upper limb: Secondary | ICD-10-CM | POA: Diagnosis not present

## 2018-06-09 DIAGNOSIS — D1801 Hemangioma of skin and subcutaneous tissue: Secondary | ICD-10-CM | POA: Diagnosis not present

## 2018-06-09 DIAGNOSIS — M67431 Ganglion, right wrist: Secondary | ICD-10-CM | POA: Diagnosis not present

## 2018-06-09 DIAGNOSIS — D1809 Hemangioma of other sites: Secondary | ICD-10-CM | POA: Diagnosis not present

## 2018-06-09 DIAGNOSIS — G5601 Carpal tunnel syndrome, right upper limb: Secondary | ICD-10-CM | POA: Diagnosis not present

## 2018-06-14 DIAGNOSIS — Z853 Personal history of malignant neoplasm of breast: Secondary | ICD-10-CM | POA: Diagnosis not present

## 2018-07-13 DIAGNOSIS — C50911 Malignant neoplasm of unspecified site of right female breast: Secondary | ICD-10-CM | POA: Diagnosis not present

## 2018-07-13 DIAGNOSIS — R413 Other amnesia: Secondary | ICD-10-CM | POA: Diagnosis not present

## 2018-07-13 DIAGNOSIS — E7800 Pure hypercholesterolemia, unspecified: Secondary | ICD-10-CM | POA: Diagnosis not present

## 2018-07-13 DIAGNOSIS — I619 Nontraumatic intracerebral hemorrhage, unspecified: Secondary | ICD-10-CM | POA: Diagnosis not present

## 2018-07-13 DIAGNOSIS — G4733 Obstructive sleep apnea (adult) (pediatric): Secondary | ICD-10-CM | POA: Diagnosis not present

## 2018-07-13 DIAGNOSIS — I6930 Unspecified sequelae of cerebral infarction: Secondary | ICD-10-CM | POA: Diagnosis not present

## 2018-07-13 DIAGNOSIS — J309 Allergic rhinitis, unspecified: Secondary | ICD-10-CM | POA: Diagnosis not present

## 2018-07-13 DIAGNOSIS — M8588 Other specified disorders of bone density and structure, other site: Secondary | ICD-10-CM | POA: Diagnosis not present

## 2018-07-13 DIAGNOSIS — Z1389 Encounter for screening for other disorder: Secondary | ICD-10-CM | POA: Diagnosis not present

## 2018-07-13 DIAGNOSIS — E039 Hypothyroidism, unspecified: Secondary | ICD-10-CM | POA: Diagnosis not present

## 2018-07-13 DIAGNOSIS — Z1159 Encounter for screening for other viral diseases: Secondary | ICD-10-CM | POA: Diagnosis not present

## 2018-07-13 DIAGNOSIS — Z0000 Encounter for general adult medical examination without abnormal findings: Secondary | ICD-10-CM | POA: Diagnosis not present

## 2018-07-13 DIAGNOSIS — F329 Major depressive disorder, single episode, unspecified: Secondary | ICD-10-CM | POA: Diagnosis not present

## 2018-12-13 ENCOUNTER — Other Ambulatory Visit: Payer: Self-pay | Admitting: Oncology

## 2018-12-13 DIAGNOSIS — C50411 Malignant neoplasm of upper-outer quadrant of right female breast: Secondary | ICD-10-CM

## 2018-12-23 DIAGNOSIS — Z411 Encounter for cosmetic surgery: Secondary | ICD-10-CM | POA: Diagnosis not present

## 2018-12-23 DIAGNOSIS — L719 Rosacea, unspecified: Secondary | ICD-10-CM | POA: Diagnosis not present

## 2018-12-23 DIAGNOSIS — Z23 Encounter for immunization: Secondary | ICD-10-CM | POA: Diagnosis not present

## 2018-12-24 DIAGNOSIS — R251 Tremor, unspecified: Secondary | ICD-10-CM | POA: Diagnosis not present

## 2018-12-24 DIAGNOSIS — R413 Other amnesia: Secondary | ICD-10-CM | POA: Diagnosis not present

## 2018-12-24 DIAGNOSIS — E039 Hypothyroidism, unspecified: Secondary | ICD-10-CM | POA: Diagnosis not present

## 2018-12-24 DIAGNOSIS — R8290 Unspecified abnormal findings in urine: Secondary | ICD-10-CM | POA: Diagnosis not present

## 2018-12-24 DIAGNOSIS — F329 Major depressive disorder, single episode, unspecified: Secondary | ICD-10-CM | POA: Diagnosis not present

## 2018-12-24 DIAGNOSIS — R109 Unspecified abdominal pain: Secondary | ICD-10-CM | POA: Diagnosis not present

## 2018-12-24 DIAGNOSIS — R197 Diarrhea, unspecified: Secondary | ICD-10-CM | POA: Diagnosis not present

## 2018-12-24 DIAGNOSIS — G4700 Insomnia, unspecified: Secondary | ICD-10-CM | POA: Diagnosis not present

## 2018-12-31 ENCOUNTER — Encounter (HOSPITAL_COMMUNITY): Payer: Self-pay | Admitting: Emergency Medicine

## 2018-12-31 ENCOUNTER — Emergency Department (HOSPITAL_COMMUNITY): Payer: PPO

## 2018-12-31 ENCOUNTER — Emergency Department (HOSPITAL_COMMUNITY)
Admission: EM | Admit: 2018-12-31 | Discharge: 2018-12-31 | Disposition: A | Discharge status: Home / Self Care | Payer: PPO | Attending: Emergency Medicine | Admitting: Emergency Medicine

## 2018-12-31 DIAGNOSIS — Z79899 Other long term (current) drug therapy: Secondary | ICD-10-CM | POA: Insufficient documentation

## 2018-12-31 DIAGNOSIS — R1030 Lower abdominal pain, unspecified: Secondary | ICD-10-CM | POA: Diagnosis not present

## 2018-12-31 DIAGNOSIS — R531 Weakness: Secondary | ICD-10-CM | POA: Diagnosis not present

## 2018-12-31 DIAGNOSIS — Z853 Personal history of malignant neoplasm of breast: Secondary | ICD-10-CM | POA: Diagnosis not present

## 2018-12-31 DIAGNOSIS — Z8673 Personal history of transient ischemic attack (TIA), and cerebral infarction without residual deficits: Secondary | ICD-10-CM | POA: Insufficient documentation

## 2018-12-31 DIAGNOSIS — R251 Tremor, unspecified: Secondary | ICD-10-CM

## 2018-12-31 DIAGNOSIS — F419 Anxiety disorder, unspecified: Secondary | ICD-10-CM | POA: Diagnosis not present

## 2018-12-31 DIAGNOSIS — R05 Cough: Secondary | ICD-10-CM | POA: Diagnosis not present

## 2018-12-31 DIAGNOSIS — R0602 Shortness of breath: Secondary | ICD-10-CM | POA: Diagnosis not present

## 2018-12-31 DIAGNOSIS — E860 Dehydration: Secondary | ICD-10-CM | POA: Diagnosis not present

## 2018-12-31 LAB — COMPREHENSIVE METABOLIC PANEL
ALT: 20 U/L (ref 0–44)
AST: 17 U/L (ref 15–41)
Albumin: 4.3 g/dL (ref 3.5–5.0)
Alkaline Phosphatase: 65 U/L (ref 38–126)
Anion gap: 13 (ref 5–15)
BUN: 13 mg/dL (ref 8–23)
CO2: 20 mmol/L — ABNORMAL LOW (ref 22–32)
CREATININE: 1.26 mg/dL — AB (ref 0.44–1.00)
Calcium: 9.9 mg/dL (ref 8.9–10.3)
Chloride: 104 mmol/L (ref 98–111)
GFR calc Af Amer: 50 mL/min — ABNORMAL LOW (ref >60)
GFR calc non Af Amer: 43 mL/min — ABNORMAL LOW (ref >60)
Glucose, Bld: 112 mg/dL — ABNORMAL HIGH (ref 70–99)
Potassium: 4.1 mmol/L (ref 3.5–5.1)
Sodium: 137 mmol/L (ref 135–145)
Total Bilirubin: 0.6 mg/dL (ref 0.3–1.2)
Total Protein: 7.8 g/dL (ref 6.5–8.1)

## 2018-12-31 LAB — CBC WITH DIFFERENTIAL/PLATELET
Abs Immature Granulocytes: 0.01 10*3/uL (ref 0.00–0.07)
Basophils Absolute: 0 10*3/uL (ref 0.0–0.1)
Basophils Relative: 0 %
Eosinophils Absolute: 0 10*3/uL (ref 0.0–0.5)
Eosinophils Relative: 0 %
HCT: 44.3 % (ref 36.0–46.0)
Hemoglobin: 14.8 g/dL (ref 12.0–15.0)
Immature Granulocytes: 0 %
Lymphocytes Relative: 27 %
Lymphs Abs: 2 10*3/uL (ref 0.7–4.0)
MCH: 29.5 pg (ref 26.0–34.0)
MCHC: 33.4 g/dL (ref 30.0–36.0)
MCV: 88.4 fL (ref 80.0–100.0)
MONO ABS: 0.5 10*3/uL (ref 0.1–1.0)
Monocytes Relative: 6 %
Neutro Abs: 4.9 10*3/uL (ref 1.7–7.7)
Neutrophils Relative %: 67 %
Platelets: 303 10*3/uL (ref 150–400)
RBC: 5.01 MIL/uL (ref 3.87–5.11)
RDW: 12.3 % (ref 11.5–15.5)
WBC: 7.4 10*3/uL (ref 4.0–10.5)
nRBC: 0 % (ref 0.0–0.2)

## 2018-12-31 LAB — URINALYSIS, ROUTINE W REFLEX MICROSCOPIC
BILIRUBIN URINE: NEGATIVE
Bacteria, UA: NONE SEEN
Glucose, UA: NEGATIVE mg/dL
Ketones, ur: 5 mg/dL — AB
Leukocytes, UA: NEGATIVE
Nitrite: NEGATIVE
Protein, ur: NEGATIVE mg/dL
Specific Gravity, Urine: 1.002 — ABNORMAL LOW (ref 1.005–1.030)
pH: 8 (ref 5.0–8.0)

## 2018-12-31 LAB — LIPASE, BLOOD: Lipase: 42 U/L (ref 11–51)

## 2018-12-31 MED ORDER — LORAZEPAM 1 MG PO TABS: 0.5000 mg | ORAL_TABLET | Freq: Two times a day (BID) | ORAL | 0 refills | Status: DC | PRN

## 2018-12-31 MED ORDER — LACTATED RINGERS IV BOLUS
1000.0000 mL | Freq: Once | INTRAVENOUS | Status: AC
  Administered 2018-12-31: 1000 mL via INTRAVENOUS

## 2018-12-31 MED ORDER — ONDANSETRON 4 MG PO TBDP: 4.0000 mg | ORAL_TABLET | ORAL | 0 refills | Status: DC | PRN

## 2018-12-31 MED ORDER — ONDANSETRON 4 MG PO TBDP
4.0000 mg | ORAL_TABLET | Freq: Once | ORAL | Status: AC
  Administered 2018-12-31: 4 mg via ORAL
  Filled 2018-12-31: qty 1

## 2018-12-31 MED ORDER — LORAZEPAM 1 MG PO TABS
0.5000 mg | ORAL_TABLET | Freq: Once | ORAL | Status: AC
  Administered 2018-12-31: 0.5 mg via ORAL
  Filled 2018-12-31: qty 1

## 2018-12-31 NOTE — ED Notes (Signed)
Patient transported to X-ray 

## 2018-12-31 NOTE — ED Notes (Signed)
Pt ambulated to restroom located across from room, tolerated well.

## 2018-12-31 NOTE — Discharge Instructions (Addendum)
1.  Complete Bactrim (your antibiotic for urinary tract infection) to total of 5 days and then discontinue. 2.  Take Zofran if needed for nausea.  Stay hydrated keeping fluids at your bedside for sipping.  Start eating bland foods. 3.  You may take 1/2 mg of Ativan up to 2 times a day for anxiety.  You must call your doctor Monday to discuss this medication and ongoing management of anxiety symptoms. 4.  Return to the emergency department if your symptoms are worsening or changing.

## 2018-12-31 NOTE — ED Notes (Signed)
Pt verbalized understanding of discharge paperwork, prescriptions and follow-up care 

## 2018-12-31 NOTE — ED Provider Notes (Signed)
[facility] EMERGENCY DEPARTMENT Provider Note   CSN: [id number] Arrival date & time: 12/31/18  1023     History   Chief Complaint Chief Complaint  Patient presents with  . Weakness    HPI Barbara [name] is a 68 y.o. female.  HPI Patient reports she feels very shaky and weak all over.  She states that she had a urinary tract infection and has been taking antibiotics (Bactrim) for about a week.  She takes an antibiotic twice a day. She and her husband both also had respiratory illness.  He reports that started about 2 weeks ago.  He reports that he got over it.  At onset they had some coughing.  No documented fever at any point in time.  Patient reports she still feels like she has something in her right lung.  If she exhales really hard and coughs it feels like there is congestion.  She has been having ongoing lower abdominal discomfort.  Despite treatment for UTI.  He has been achy and crampy in nature.  No lower extremity swelling or calf pain.  Patient got restarted on duloxetine about a 2 weeks ago.  She was first taking it every other day but now is up to a daily dose.  This is to treat for anxiety.. Past Medical History:  Diagnosis Date  . Anxiety   . Breast cancer ([place])   . GERD (gastroesophageal reflux disease)   . Hashimoto's thyroiditis    with goiter  . Headache(784.0)    hx migraines  . Hypercholesteremia   . Spina bifida ([place])    [place] L5  . 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  Narrowing of the 4th and 5th lumbar interspaces.   . Stroke ([place]) 11/2015  . Varicose vein     Patient Active Problem List   Diagnosis Date Noted  . Genetic testing 03/06/2016  . Hemorrhagic stroke ([place]) 01/29/2016  . SAH (subarachnoid hemorrhage) ([place]) 01/29/2016  . Memory loss 01/29/2016  . Paroxysmal atrial fibrillation ([place]) 01/29/2016  . Cerebral amyloid angiopathy ([place]) 01/29/2016  . ICH (intracerebral  hemorrhage) ([place]) - R occipital 12/27/2015  . Atrial fibrillation with RVR ([place]) 12/27/2015  . Hyperlipidemia 12/27/2015  . Cognitive deficits 12/27/2015  . Hypothyroidism 12/27/2015  . Anxiety 12/27/2015  . Subarachnoid hemorrhage ([place]) - L frontal 12/25/2015  . Cerebellar stroke ([place]) 12/02/2014  . Osteopenia 12/01/2014  . Rhinitis, chronic 01/13/2014  . Malignant neoplasm of upper-outer quadrant of right breast in female, estrogen receptor positive ([place]) 09/26/2013    Past Surgical History:  Procedure Laterality Date  . BREAST RECONSTRUCTION WITH PLACEMENT OF TISSUE EXPANDER AND FLEX HD (ACELLULAR HYDRATED DERMIS) Bilateral 10/13/2013   Procedure: PLACEMENT OF BILATERAL TISSUE EXPANDER FOR BREAST RECONSTRUCTION ;  Surgeon: Crissie Reese, MD;  Location: [facility];  Service: Plastics;  Laterality: Bilateral;  . CYSTOSCOPY WITH URETHRAL CARUNCLE    . HERNIA REPAIR  1976   Left groin  . MASTECTOMY W/ SENTINEL NODE BIOPSY Bilateral 10/13/2013   Procedure:  BILATERAL MASTECTOMY WITH RIGHT SENTINEL LYMPH NODE BIOPSY;  Surgeon: Merrie Roof, MD;  Location: [facility];  Service: General;  Laterality: Bilateral;  . PORT-A-CATH REMOVAL Left 12/21/2014   Procedure: MINOR REMOVAL OF PORT-A-CATH;  Surgeon: Autumn Messing III, MD;  Location: [facility];  Service: General;  Laterality: Left;  . PORTACATH PLACEMENT Left 10/13/2013   Procedure: INSERTION PORT-A-CATH;  Surgeon: Sammuel Hines  Daiva Nakayama, MD;  Location: [facility];  Service: General;  Laterality: Left;  . TUBAL LIGATION    . WISDOM TOOTH EXTRACTION  1990     OB History    Gravida  2   Para  2   Term      Preterm      AB      Living  2     SAB      TAB      Ectopic      Multiple      Live Births               Home Medications    Prior to Admission medications   Medication Sig Start Date End Date Taking? Authorizing Provider  Acetaminophen (TYLENOL PO) Take by mouth as needed (dosage unspecified).    [provider]  anastrozole (ARIMIDEX) 1 MG tablet TAKE 1 TABLET BY MOUTH ONCE DAILY 12/13/18   Magrinat, Virgie Dad, MD  atorvastatin (LIPITOR) 20 MG tablet Take 20 mg by mouth daily.    [provider]  BIOTIN PO Take 5,000 mcg by mouth every other day.     [provider]  cholecalciferol 5000 UNITS TABS Take 1 tablet (5,000 Units total) by mouth daily. 10/03/15   Magrinat, Virgie Dad, MD  fexofenadine (ALLEGRA) 180 MG tablet Take 1 tablet (180 mg total) by mouth as needed. 03/15/18   Magrinat, Virgie Dad, MD  Fluticasone Propionate (FLONASE NA) Place into the nose as needed.    [provider]  levothyroxine (SYNTHROID, LEVOTHROID) 50 MCG tablet Take 50 mcg by mouth daily before breakfast. Take 50 mcg 1 hours before breakfast.    Kelton Pillar, MD  LORazepam (ATIVAN) 1 MG tablet Take 0.5 tablets (0.5 mg total) by mouth 2 (two) times daily as needed for anxiety. 12/31/18   Charlesetta Shanks, MD  OMEPRAZOLE PO Take by mouth daily.    [provider]  ondansetron (ZOFRAN ODT) 4 MG disintegrating tablet Take 1 tablet (4 mg total) by mouth every 4 (four) hours as needed for nausea or vomiting. 12/31/18   Charlesetta Shanks, MD  PARoxetine (PAXIL) 20 MG tablet Take 20 mg by mouth at bedtime.     Kelton Pillar, MD  Propylene Glycol (SYSTANE BALANCE) 0.6 % SOLN Apply 1 drop to eye daily as needed (dry eyes).    [provider]    Family History Family History  Problem Relation Age of Onset  . Breast cancer Mother 58  . Lung cancer Father   . Cancer Father   . Heart attack Father   . Breast cancer Sister 51       Bilateral breast cancer with 2nd dx at 61  . Breast cancer Maternal Aunt 52  . Breast cancer Sister 65  . Lung cancer Maternal Uncle   . Leukemia Paternal Uncle   . Melanoma Brother 52       on ear  . Prostate cancer Brother 20  . Cancer Cousin        maternal cousin with cancer - NOS  . Hyperthyroidism Daughter     Social History Social History    Tobacco Use  . Smoking status: Never Smoker  . Smokeless tobacco: Never Used  Substance Use Topics  . Alcohol use: Yes    Alcohol/week: 0.0 standard drinks    Comment: Rare  . Drug use: No     Allergies   Buprenex [buprenorphine]; Simvastatin; and Codeine   Review of Systems  Review of Systems 10 Systems reviewed and are negative for acute change except as noted in the HPI.   Physical Exam Updated Vital Signs BP (!) 155/80   Pulse 75   Temp 98.3 F (36.8 C) (Oral)   Resp 15   LMP 11/24/2004   SpO2 100%   Physical Exam Constitutional:      Comments: Patient is alert and nontoxic.  No respiratory distress.  Patient does have anxious appearance.  Tremulous and borderline tearful.  HENT:     Head: Normocephalic and atraumatic.     Nose: Nose normal.     Mouth/Throat:     Mouth: Mucous membranes are moist.     Pharynx: Oropharynx is clear.  Eyes:     Extraocular Movements: Extraocular movements intact.     Pupils: Pupils are equal, round, and reactive to light.  Neck:     Musculoskeletal: Normal range of motion and neck supple.  Cardiovascular:     Rate and Rhythm: Normal rate and regular rhythm.  Pulmonary:     Effort: Pulmonary effort is normal.     Breath sounds: Normal breath sounds.  Abdominal:     General: There is no distension.     Palpations: Abdomen is soft.     Tenderness: There is no abdominal tenderness. There is no guarding.  Musculoskeletal: Normal range of motion.        General: No swelling or tenderness.     Right lower leg: No edema.     Left lower leg: No edema.  Skin:    General: Skin is warm and dry.  Neurological:     General: No focal deficit present.     Mental Status: She is oriented to person, place, and time.     Coordination: Coordination normal.     Comments: Patient is anxious and slightly tremulous in the upper extremities.  This abates with distraction and conversation.  All movements are coordinated purposeful symmetric.    Psychiatric:     Comments: Patient is very anxious and borderline tearful.      ED Treatments / Results  Labs (all labs ordered are listed, but only abnormal results are displayed) Labs Reviewed  COMPREHENSIVE METABOLIC PANEL - Abnormal; Notable for the following components:      Result Value   CO2 20 (*)    Glucose, Bld 112 (*)    Creatinine, Ser 1.26 (*)    GFR calc non Af Amer 43 (*)    GFR calc Af Amer 50 (*)    All other components within normal limits  URINALYSIS, ROUTINE W REFLEX MICROSCOPIC - Abnormal; Notable for the following components:   Color, Urine COLORLESS (*)    Specific Gravity, Urine 1.002 (*)    Hgb urine dipstick SMALL (*)    Ketones, ur 5 (*)    All other components within normal limits  LIPASE, BLOOD  CBC WITH DIFFERENTIAL/PLATELET    EKG None  Radiology Dg Chest 2 View  Result Date: 12/31/2018 CLINICAL DATA:  Cough and shortness of breath. History of breast carcinoma EXAM: CHEST - 2 VIEW COMPARISON:  December 25, 2015. FINDINGS: There is no edema or consolidation. The heart size and pulmonary vascularity are normal. No adenopathy. No bone lesions. Breast implants noted bilaterally. IMPRESSION: No edema or consolidation.  No evident adenopathy. Electronically Signed   By: Lowella Grip III M.D.   On: 12/31/2018 11:15    Procedures Procedures (including critical care time)  Medications Ordered in ED Medications  lactated  ringers bolus 1,000 mL (0 mLs Intravenous Stopped 12/31/18 1414)  LORazepam (ATIVAN) tablet 0.5 mg (0.5 mg Oral Given 12/31/18 1512)  ondansetron (ZOFRAN-ODT) disintegrating tablet 4 mg (4 mg Oral Given 12/31/18 1512)     Initial Impression / Assessment and Plan / ED Course  I have reviewed the triage vital signs and the nursing notes.  Pertinent labs & imaging results that were available during my care of the patient were reviewed by me and considered in my medical decision making (see chart for details).    Patient  clinically well in appearance.  Diagnostic work-up is for very mild dehydration.  Patient is rehydrated emergency department.  She is taking oral intake.  Urinalysis is clear at this time.  I have advised patient to complete Bactrim to a 5-day total and then discontinue.  This may be a cause for some gastritis type symptoms.  Patient has tremulousness and is anxious in appearance.  This has been an ongoing problem and she has recently been started on duloxetine.  Is possible this is temporarily exacerbating anxiety.  I will provide the patient with several days of Ativan 0.5 twice daily until she can follow-up with her doctor early in the week to discuss the symptoms she is experiencing.  Vital signs are stable and she is clinically well.  Patient is with her husband who is also involved in her healthcare.  At this time she is stable for discharge.  Return precautions reviewed.  Final Clinical Impressions(s) / ED Diagnoses   Final diagnoses:  Generalized weakness  Tremor  Anxiety  Mild dehydration    ED Discharge Orders         Ordered    LORazepam (ATIVAN) 1 MG tablet  2 times daily PRN     12/31/18 1559    ondansetron (ZOFRAN ODT) 4 MG disintegrating tablet  Every 4 hours PRN     12/31/18 1559           Charlesetta Shanks, MD 12/31/18 1606

## 2018-12-31 NOTE — ED Triage Notes (Signed)
Pt reports having stomach bug recently and was given abx for. Pt here today for generalized weakness and shaking.

## 2019-01-13 DIAGNOSIS — N390 Urinary tract infection, site not specified: Secondary | ICD-10-CM | POA: Diagnosis not present

## 2019-01-13 DIAGNOSIS — G4700 Insomnia, unspecified: Secondary | ICD-10-CM | POA: Diagnosis not present

## 2019-01-13 DIAGNOSIS — E039 Hypothyroidism, unspecified: Secondary | ICD-10-CM | POA: Diagnosis not present

## 2019-01-13 DIAGNOSIS — F329 Major depressive disorder, single episode, unspecified: Secondary | ICD-10-CM | POA: Diagnosis not present

## 2019-01-13 DIAGNOSIS — G4733 Obstructive sleep apnea (adult) (pediatric): Secondary | ICD-10-CM | POA: Diagnosis not present

## 2019-01-13 DIAGNOSIS — E7800 Pure hypercholesterolemia, unspecified: Secondary | ICD-10-CM | POA: Diagnosis not present

## 2019-01-13 DIAGNOSIS — R413 Other amnesia: Secondary | ICD-10-CM | POA: Diagnosis not present

## 2019-01-20 ENCOUNTER — Ambulatory Visit: Payer: PPO | Admitting: Adult Health

## 2019-03-07 ENCOUNTER — Encounter: Payer: Self-pay | Admitting: *Deleted

## 2019-03-07 ENCOUNTER — Telehealth: Payer: Self-pay | Admitting: *Deleted

## 2019-03-07 NOTE — Telephone Encounter (Signed)
Spoke to pt and husband, Ed on speaker phone.  Consented to [facility] for cpap/aerocare. Due to current COVID 19 pandemic, our office is severely reducing in office visits for at least the next 2 weeks, in order to minimize the risk to our patients and healthcare providers.  Pt understands that although there may be some limitations with this type of visit, we will take all precautions to reduce any security or privacy concerns.  Pt understands that this will be treated like an in office visit and we will file with pt's insurance.  Call her mobile # [phone].  Chart updated.

## 2019-03-10 ENCOUNTER — Telehealth: Payer: Self-pay | Admitting: Oncology

## 2019-03-10 NOTE — Telephone Encounter (Signed)
Per 4/15 schedule message moved April appointments to June. Left message. Schedule mailed.

## 2019-03-14 ENCOUNTER — Other Ambulatory Visit: Payer: Self-pay | Admitting: Oncology

## 2019-03-14 DIAGNOSIS — C50411 Malignant neoplasm of upper-outer quadrant of right female breast: Secondary | ICD-10-CM

## 2019-03-16 ENCOUNTER — Other Ambulatory Visit: Payer: PPO

## 2019-03-16 ENCOUNTER — Ambulatory Visit: Payer: PPO | Admitting: Oncology

## 2019-03-16 ENCOUNTER — Ambulatory Visit: Payer: PPO

## 2019-03-17 ENCOUNTER — Ambulatory Visit: Payer: PPO | Admitting: Oncology

## 2019-03-24 ENCOUNTER — Encounter: Payer: Self-pay | Admitting: Family Medicine

## 2019-03-24 ENCOUNTER — Ambulatory Visit: Payer: PPO | Admitting: Adult Health

## 2019-03-24 ENCOUNTER — Ambulatory Visit (INDEPENDENT_AMBULATORY_CARE_PROVIDER_SITE_OTHER): Payer: PPO | Admitting: Family Medicine

## 2019-03-24 ENCOUNTER — Other Ambulatory Visit: Payer: Self-pay

## 2019-03-24 DIAGNOSIS — Z9989 Dependence on other enabling machines and devices: Secondary | ICD-10-CM

## 2019-03-24 DIAGNOSIS — G4733 Obstructive sleep apnea (adult) (pediatric): Secondary | ICD-10-CM | POA: Diagnosis not present

## 2019-03-24 NOTE — Progress Notes (Addendum)
PATIENT: Barbara [name] DOB: 12/09/1950  REASON FOR VISIT: follow up HISTORY FROM: patient  Virtual Visit via Telephone Note  I connected with Barbara [name] on 03/24/19 at  2:00 PM EDT by telephone and verified that I am speaking with the correct person using two identifiers.   I discussed the limitations, risks, security and privacy concerns of performing an evaluation and management service by telephone and the availability of in person appointments. I also discussed with the patient that there may be a patient responsible charge related to this service. The patient expressed understanding and agreed to proceed.   History of Present Illness:  03/24/19 Barbara [name] is a 68 y.o. female for follow up of OSA on CPAP. She is doing well and tolerating therapy. She is using therapy every night. She has no concerns today.    02/21/2019 - 03/22/2019  - 03/22/2019 Usage days 30/30 days (100%) >= 4 hours 29 days (97%) < 4 hours 1 days (3%) Usage hours 245 hours 30 minutes Average usage (total days) 8 hours 11 minutes Average usage (days used) 8 hours 11 minutes Median usage (days used) 8 hours 29 minutes Total used hours (value since last reset - 03/22/2019) 8,430 hours AirSense 10 AutoSet For Her Serial number [id number] Mode CPAP Set pressure 8 cmH2O EPR Fulltime EPR level 3 Therapy Leaks - L/min Median: 0.0 95th percentile: 1.0 Maximum: 9.6 Events per hour AI: 6.2 HI: 0.3 AHI: 6.5 Apnea Index Central: 0.5 Obstructive: 5.6 Unknown: 0.0 RERA Index 1.6 Cheyne-Stokes respiration (average duration per night) 2 minutes (1%)   Observations/Objective:  Generalized: in no acute distress  Mentation: Alert oriented to time, place, history taking. Follows all commands speech and language fluent   Assessment and Plan:  68 y.o. year old female  has a past medical history of Anxiety, Breast cancer ([place]), GERD (gastroesophageal reflux disease), Hashimoto's thyroiditis,  Headache(784.0), Hypercholesteremia, Spina bifida ([place]), Spondylarthritis, Stroke ([place]) (11/2015), and Varicose vein. with    ICD-10-CM   1. OSA on CPAP G47.33 For home use only DME continuous positive airway pressure (CPAP)   Z99.89 For home use only DME continuous positive airway pressure (CPAP)   She is doing well with excellent compliance. She does have a slightly elevated AHI at 6.5. Last note by Jinny Blossom states pressure would be increased to [place]. Patient is not certain of pressure. We will send new order for pressure to be [facility] at [facility] as well as for supplies. I have encouraged Barbara [name] to continue using CPAP therapy nightly and for greater than 4 hours each night.  We will follow-up in 1 year, sooner if needed.  She verbalizes understanding and agreement with this plan.   Orders Placed This Encounter  Procedures  . For home use only DME continuous positive airway pressure (CPAP)    All supplies please    Order Specific Question:   Patient has OSA or probable OSA    Answer:   Yes    Order Specific Question:   Is the patient currently using CPAP in the home    Answer:   Yes    Order Specific Question:   Settings    Answer:   Other see comments    Order Specific Question:   CPAP supplies needed    Answer:   Mask, headgear, cushions, filters, heated tubing and water chamber  . For home use only DME continuous positive airway pressure (CPAP)    Please set pressure at [facility]  Specific Question:   Patient has OSA or probable OSA    Answer:   Yes    Order Specific Question:   Is the patient currently using CPAP in the home    Answer:   Yes    Order Specific Question:   Settings    Answer:   Other see comments    Order Specific Question:   CPAP supplies needed    Answer:   Mask, headgear, cushions, filters, heated tubing and water chamber    No orders of the defined types were placed in this encounter.    Follow Up Instructions:  I discussed the assessment and treatment plan  with the patient. The patient was provided an opportunity to ask questions and all were answered. The patient agreed with the plan and demonstrated an understanding of the instructions.   The patient was advised to call back or seek an in-person evaluation if the symptoms worsen or if the condition fails to improve as anticipated.  I provided 25 minutes of non-face-to-face time during this encounter.  Patient is located at her place of residence during teleconference.  We have attempted video visit, however, patient is unable to access [email or web address] and does not have another platform for video.  Provider is located at her place of residence.  Liane Comber, RN helped to facilitate visit.   Debbora Presto, NP   I reviewed the above note and documentation by the Nurse Practitioner and agree with the history, assessment and plan as outlined above. I was immediately available for phone consultation. Star Age, MD, PhD [facility])

## 2019-03-29 DIAGNOSIS — G4733 Obstructive sleep apnea (adult) (pediatric): Secondary | ICD-10-CM | POA: Diagnosis not present

## 2019-04-13 ENCOUNTER — Telehealth: Payer: Self-pay

## 2019-04-13 NOTE — Telephone Encounter (Signed)
Follow up has been scheduled. Patient is aware of appt day and time.   

## 2019-05-23 ENCOUNTER — Other Ambulatory Visit: Payer: Self-pay | Admitting: *Deleted

## 2019-05-23 DIAGNOSIS — C50411 Malignant neoplasm of upper-outer quadrant of right female breast: Secondary | ICD-10-CM

## 2019-05-24 ENCOUNTER — Inpatient Hospital Stay: Payer: PPO

## 2019-05-24 ENCOUNTER — Other Ambulatory Visit: Payer: Self-pay

## 2019-05-24 ENCOUNTER — Inpatient Hospital Stay: Payer: PPO | Attending: Oncology | Admitting: Oncology

## 2019-05-24 VITALS — BP 122/59 | HR 77 | Temp 99.1°F | Resp 18 | Ht 66.0 in | Wt 141.6 lb

## 2019-05-24 DIAGNOSIS — Z79811 Long term (current) use of aromatase inhibitors: Secondary | ICD-10-CM | POA: Diagnosis not present

## 2019-05-24 DIAGNOSIS — Z9013 Acquired absence of bilateral breasts and nipples: Secondary | ICD-10-CM | POA: Insufficient documentation

## 2019-05-24 DIAGNOSIS — E7800 Pure hypercholesterolemia, unspecified: Secondary | ICD-10-CM | POA: Insufficient documentation

## 2019-05-24 DIAGNOSIS — Z803 Family history of malignant neoplasm of breast: Secondary | ICD-10-CM | POA: Diagnosis not present

## 2019-05-24 DIAGNOSIS — Z170 Estrogen receptor positive status [ER+]: Secondary | ICD-10-CM | POA: Diagnosis not present

## 2019-05-24 DIAGNOSIS — F419 Anxiety disorder, unspecified: Secondary | ICD-10-CM | POA: Diagnosis not present

## 2019-05-24 DIAGNOSIS — C50911 Malignant neoplasm of unspecified site of right female breast: Secondary | ICD-10-CM

## 2019-05-24 DIAGNOSIS — C50411 Malignant neoplasm of upper-outer quadrant of right female breast: Secondary | ICD-10-CM

## 2019-05-24 DIAGNOSIS — Z79899 Other long term (current) drug therapy: Secondary | ICD-10-CM | POA: Diagnosis not present

## 2019-05-24 DIAGNOSIS — Z8673 Personal history of transient ischemic attack (TIA), and cerebral infarction without residual deficits: Secondary | ICD-10-CM | POA: Insufficient documentation

## 2019-05-24 LAB — CMP (CANCER CENTER ONLY)
ALT: 17 U/L (ref 0–44)
AST: 13 U/L — ABNORMAL LOW (ref 15–41)
Albumin: 4.1 g/dL (ref 3.5–5.0)
Alkaline Phosphatase: 70 U/L (ref 38–126)
Anion gap: 9 (ref 5–15)
BUN: 22 mg/dL (ref 8–23)
CO2: 24 mmol/L (ref 22–32)
Calcium: 9.4 mg/dL (ref 8.9–10.3)
Chloride: 105 mmol/L (ref 98–111)
Creatinine: 0.98 mg/dL (ref 0.44–1.00)
GFR, Est AFR Am: >60 mL/min (ref >60)
GFR, Estimated: 59 mL/min — ABNORMAL LOW (ref >60)
Glucose, Bld: 99 mg/dL (ref 70–99)
Potassium: 4.2 mmol/L (ref 3.5–5.1)
Sodium: 138 mmol/L (ref 135–145)
Total Bilirubin: 0.4 mg/dL (ref 0.3–1.2)
Total Protein: 7.3 g/dL (ref 6.5–8.1)

## 2019-05-24 LAB — CBC WITH DIFFERENTIAL (CANCER CENTER ONLY)
Abs Immature Granulocytes: 0.02 10*3/uL (ref 0.00–0.07)
Basophils Absolute: 0.1 10*3/uL (ref 0.0–0.1)
Basophils Relative: 1 %
Eosinophils Absolute: 0.1 10*3/uL (ref 0.0–0.5)
Eosinophils Relative: 2 %
HCT: 41.1 % (ref 36.0–46.0)
Hemoglobin: 13.8 g/dL (ref 12.0–15.0)
Immature Granulocytes: 0 %
Lymphocytes Relative: 32 %
Lymphs Abs: 2.4 10*3/uL (ref 0.7–4.0)
MCH: 31.5 pg (ref 26.0–34.0)
MCHC: 33.6 g/dL (ref 30.0–36.0)
MCV: 93.8 fL (ref 80.0–100.0)
Monocytes Absolute: 0.4 10*3/uL (ref 0.1–1.0)
Monocytes Relative: 6 %
Neutro Abs: 4.4 10*3/uL (ref 1.7–7.7)
Neutrophils Relative %: 59 %
Platelet Count: 276 10*3/uL (ref 150–400)
RBC: 4.38 MIL/uL (ref 3.87–5.11)
RDW: 11.9 % (ref 11.5–15.5)
WBC Count: 7.4 10*3/uL (ref 4.0–10.5)
nRBC: 0 % (ref 0.0–0.2)

## 2019-05-24 NOTE — Progress Notes (Signed)
ID: Barbara Barbara [name] OB: 11-06-1951  MR#: [id number]  DGL#:[id number]  Patient Care Team: Barbara Pillar, MD as PCP - General (Family Medicine) Barbara Age, MD as Attending Physician (Neurology) Barbara Bombard, MD as Consulting Physician (Neurology) Barbara Barbara [name], Barbara Dad, MD as Consulting Physician (Oncology) Barbara Gilding, MD as Referring Physician (Orthopedic Surgery) OTHER MD:  CHIEF COMPLAINT: Right Breast Cancer (s/p bilateral mastectomies)  CURRENT TREATMENT: Completing 5 years of anastrozole   INTERVAL HISTORY: Barbara Barbara [name] returns today for follow-up of her estrogen receptor positive breast cancer.   She continues on anastrozole, with good tolerance. She denies hot flashes and any major issues with vaginal dryness.  She also receives zolendronate yearly with a dose due today.  Her last dose was 03/18/2018 she tolerates this well.   Since her last visit, she underwent DEXA scan on 06/14/2018 at [facility]. This showed a T-score of -1.0, which is considered normal.  She presented to the [name] on 12/31/2018 with cough and shortness of breath. Chest x-ray performed that day showed no edema or consolidation and no evident adenopathy.    REVIEW OF SYSTEMS: Barbara Barbara [name] reports she and her husband Barbara [name] walk for exercise, about a mile at a time. She enjoys gardening at home. A detailed review of systems was otherwise entirely negative.   BREAST CANCER HISTORY: From the original intake note:  The patient has a very strong family history for breast cancer, although she has tested  negative for the BRCA mutations (as have 2 of her sisters). She had been receiving breast MRI in addition to mammography, but had not had this test performed since October of 2010. More recently, 07/22/2013, routine screening mammography with tomography at [facility] showed no worrisome findings in the setting of extremely dense breasts.  MRI of the breast 09/08/2013, however, showed in the right upper outer quadrant an irregular mass  measuring 1.7 cm. This was new as compared to the prior MRI from 2010. There were no abnormal lymph nodes and no other areas of concern in either breast. 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 The MIB-1 was 25%.  The patient's subsequent history is as detailed below   PAST MEDICAL HISTORY: Past Medical History:  Diagnosis Date  . Anxiety   . Breast cancer (Barbara Barbara [name])   . GERD (gastroesophageal reflux disease)   . Hashimoto's thyroiditis    with goiter  . Headache(784.0)    hx migraines  . Hypercholesteremia   . Spina bifida (Barbara [name])    [place] Barbara [name]  . Spondylarthritis    Low grade Barbara [name] on S1 and natural arches of Barbara [name] being opend bilaterally.  Narrowing of the 4th and 5th lumbar interspaces.   . Stroke ([place]) 11/2015  . Varicose vein     PAST SURGICAL HISTORY: Past Surgical History:  Procedure Laterality Date  . BREAST RECONSTRUCTION WITH PLACEMENT OF TISSUE EXPANDER AND FLEX HD (ACELLULAR HYDRATED DERMIS) Bilateral 10/13/2013   Procedure: PLACEMENT OF BILATERAL TISSUE EXPANDER FOR BREAST RECONSTRUCTION ;  Surgeon: Barbara Reese, MD;  Location: [name];  Service: Plastics;  Laterality: Bilateral;  . CYSTOSCOPY WITH URETHRAL CARUNCLE    . HERNIA REPAIR  1976   Left groin  . MASTECTOMY W/ SENTINEL NODE BIOPSY Bilateral 10/13/2013   Procedure:  BILATERAL MASTECTOMY WITH RIGHT SENTINEL LYMPH NODE BIOPSY;  Surgeon: Barbara Roof, MD;  Location: [name];  Service: General;  Laterality: Bilateral;  . PORT-A-CATH REMOVAL Left 12/21/2014   Procedure: MINOR REMOVAL OF PORT-A-CATH;  Surgeon: Barbara Messing III, MD;  Location: [name];  Service: General;  Laterality: Left;  . PORTACATH PLACEMENT Left 10/13/2013   Procedure: INSERTION PORT-A-CATH;  Surgeon: Barbara Roof, MD;  Location: Barbara Barbara [name];   Service: General;  Laterality: Left;  . TUBAL LIGATION    . WISDOM TOOTH EXTRACTION  1990    FAMILY HISTORY Family History  Problem Relation Barbara [name] of Onset  . Breast cancer Mother 25  . Lung cancer Father   . Cancer Father   . Heart attack Father   . Breast cancer Barbara 84       Bilateral breast cancer with 2nd dx at 19  . Breast cancer Maternal Aunt 52  . Breast cancer Barbara 30  . Lung cancer Maternal Uncle   . Leukemia Paternal Uncle   . Melanoma Brother 52       on ear  . Prostate cancer Brother 4  . Cancer Cousin        maternal cousin with cancer - NOS  . Hyperthyroidism Barbara   The patient's mother was diagnosed with breast cancer the Barbara [name] of 61. She died at the Barbara [name] of 52. The patient's father died at the Barbara [name] of 25, with a history of lung cancer. The patient had 2 brothers, and 2 sisters. The patient's Barbara Barbara [name] has a history of bilateral breast cancer in her Barbara Barbara [name] a history of left breast cancer. Both have been tested and are found to be negative for a BRCA one or 2 mutation. The patient's mother's Barbara also had a history of breast cancer. The patient herself has been tested for the BRCA mutation as well as through [facility]. No mutation has been found   GYNECOLOGIC HISTORY:  Menarche Barbara [name] 33, first live birth Barbara [name] 58. The patient is GX P2. She stopped having periods in her early 11s. She did not take hormone replacement.   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 Her husband Barbara [name] used to work for [employer]. 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 Barbara Barbara [name] is a drug representative for [employer] (diabetes). His wife, and Barbara Barbara [name], is a Ship broker in [place] emergency room's. The patient has 5 grandchildren. She at tends [place]    ADVANCED DIRECTIVES:  In place   HEALTH MAINTENANCE:  (Updated  02/10/2014) Social History   Tobacco Use  . Smoking status: Never Smoker  . Smokeless tobacco: Never Used  Substance Use Topics  . Alcohol use: Yes    Alcohol/week: 0.0 standard drinks    Comment: Rare  . Drug use: No     Colonoscopy: 2013/ [facility]  PAP: 2013/Dr. Sabra Heck  Bone density: 2013/osteopenia  Lipid panel: Not on file/Dr. Laurann Montana   Allergies  Allergen Reactions  . Buprenex [Buprenorphine] Nausea And Vomiting and Other (See Comments)    Oversedation  . Simvastatin Other (See Comments)    Myalgias   . Codeine Hives, Itching and Rash    Current Outpatient Medications  Medication Sig Dispense Refill  . Acetaminophen (TYLENOL PO) Take by mouth as needed (dosage unspecified).    Marland Kitchen anastrozole (ARIMIDEX) 1 MG tablet Take 1 tablet by mouth once daily 90 tablet 0  . atorvastatin (LIPITOR) 20 MG tablet Take 20 mg by mouth daily.    Marland Kitchen  BIOTIN PO Take 5,000 mcg by mouth every other day.     . cholecalciferol 5000 UNITS TABS Take 1 tablet (5,000 Units total) by mouth daily. 90 tablet 4  . DULoxetine (CYMBALTA) 60 MG capsule TAKE 1 CAPSULE BY MOUTH ONCE DAILY FOR 90 DAYS    . fexofenadine (ALLEGRA) 180 MG tablet Take 1 tablet (180 mg total) by mouth as needed. 90 tablet 0  . Fluticasone Propionate (FLONASE NA) Place into the nose as needed.    Marland Kitchen levothyroxine (SYNTHROID, LEVOTHROID) 50 MCG tablet Take 50 mcg by mouth daily before breakfast. Take 50 mcg 1 hours before breakfast.    . lisinopril (PRINIVIL,ZESTRIL) 10 MG tablet Take 10 mg by mouth daily.    Marland Kitchen LORazepam (ATIVAN) 0.5 MG tablet TAKE 1 2 (ONE HALF) TABLET BY MOUTH IN THE MORNING THEN 1 FULL TABLET AT BEDTIME FOR 90 DAYS    . OMEPRAZOLE PO Take by mouth daily.    . ondansetron (ZOFRAN ODT) 4 MG disintegrating tablet Take 1 tablet (4 mg total) by mouth every 4 (four) hours as needed for nausea or vomiting. (Patient not taking: Reported on 03/07/2019) 20 tablet 0  . Propylene Glycol (SYSTANE BALANCE) 0.6 % SOLN Apply 1 drop  to eye daily as needed (dry eyes).     No current facility-administered medications for this visit.     OBJECTIVE:  Middle-aged white woman who appears stated Barbara [name]  35:   05/24/19 1244  BP: (!) 122/59  Pulse: 77  Resp: 18  Temp: 99.1 F (37.3 C)  SpO2: 99%  Body mass index is 22.85 kg/m.   ECOG:  1 Filed Weights   05/24/19 1244  Weight: 141 lb 9.6 oz (64.2 kg)   Sclerae unicteric, pupils round and equal Wearing a mask No cervical or supraclavicular adenopathy Lungs no rales or rhonchi Heart regular rate and rhythm Abd soft, nontender, positive bowel sounds MSK no focal spinal tenderness, no upper extremity lymphedema Neuro: nonfocal, significant word finding issues persist Breasts: Status post bilateral mastectomies with bilateral saline implants in place.  There is no evidence of recurrence.  Both axillae are benign.   LAB RESULTS:   Lab Results  Component Value Date   WBC 7.4 05/24/2019   NEUTROABS 4.4 05/24/2019   HGB 13.8 05/24/2019   HCT 41.1 05/24/2019   MCV 93.8 05/24/2019   PLT 276 05/24/2019      Chemistry      Component Value Date/Time   NA 138 05/24/2019 1228   NA 138 03/18/2017 0942   K 4.2 05/24/2019 1228   K 4.2 03/18/2017 0942   CL 105 05/24/2019 1228   CO2 24 05/24/2019 1228   CO2 23 03/18/2017 0942   BUN 22 05/24/2019 1228   BUN 21.2 03/18/2017 0942   CREATININE 0.98 05/24/2019 1228   CREATININE 0.9 03/18/2017 0942      Component Value Date/Time   CALCIUM 9.4 05/24/2019 1228   CALCIUM 9.8 03/18/2017 0942   ALKPHOS 70 05/24/2019 1228   ALKPHOS 71 03/18/2017 0942   AST 13 (L) 05/24/2019 1228   AST 13 03/18/2017 0942   ALT 17 05/24/2019 1228   ALT 11 03/18/2017 0942   BILITOT 0.4 05/24/2019 1228   BILITOT 0.61 03/18/2017 0942      STUDIES: I bone density at [facility] 07/10/2016 found the T score to be -1.8  ASSESSMENT: 68 y.o. BRCA negative [place] woman   (1)  status post right breast biopsy 09/21/2013 for a clinical T1c  N0, stage  IA invasive ductal carcinoma, grade 2, triple positive, with a HER-2: CEP 17 ratio of 5.55 and an average copy number per cell of  [name]. The MIB-1-1 was 25%  (2) status post bilateral mastectomies 10/13/2013 showing:  (a) on the left, atypical ductal hyperplasia  (b) on the right, a pT1c pN0, stage IA invasive ductal carcinoma, grade 2  (c) completed bilateral saline implant reconstruction 07/28/2014  (3) received paclitaxel/trastuzumab, with paclitaxel given on days 1, 8, and 15 and trastuzumab given every 2 weeks on days 1 and 15 of each 28 day cycle, for 4 cycles (12 doses of paclitaxel) completed 03/24/2014  (4) continued trastuzumab every three weeks through 12/01/2014   (a) echocardiogram 10/02/2014 showed a well preserved ejection fraction  (5) The patient participated in the Barbara study remotely, completing 5 years of tamoxifen in the late 1990s  (6) started anastrozole May 2015, completing 5 years June 2020  (a) bone density 06/09/2014 showed a T score of -1.6 (decreased from prior)  (b) zolendronate started 03/21/2015, to be repeated yearly  (c) bone density July 2017 showed a T score of -1.8.  (d) bone density 06/14/2018 showed a T score of -1.0 (normal   PLAN: Zurisadai is now 5-1/2 years out from definitive surgery for her breast cancer with no evidence of disease recurrence.  This is very favorable.  She is completing 5 years of anastrozole.  By taking this she will have cut in half her risk of recurrence of this cancer and her risk of developing another breast cancer in the future.  She has done very well with the zoledronate and her most recent bone density showed a normal result.  Accordingly I do not think she needs zoledronate at this point and we are canceling today's treatment  I am comfortable releasing her now to her primary care physicians care.  All she will need in terms of breast cancer follow-up is a yearly physician chest wall/reconstructed breast  exam  I will be glad to see Agustina again at any point in the future if and when the need arises but as of now are making no further routine appointments for her here.  Eutha Cude, Barbara Dad, MD  05/24/19 1:23 PM Medical Oncology and Hematology [facility] [address] [place], [place] [address] Tel. [phone]    Fax. [phone]   I, Barbara Barbara [name], am acting as scribe for Dr. Virgie Barbara [name]. Barbara Barbara [name].  I, Barbara Del MD, have reviewed the above documentation for accuracy and completeness, and I agree with the above.

## 2019-06-13 DIAGNOSIS — Z853 Personal history of malignant neoplasm of breast: Secondary | ICD-10-CM | POA: Diagnosis not present

## 2019-06-13 DIAGNOSIS — F411 Generalized anxiety disorder: Secondary | ICD-10-CM | POA: Diagnosis not present

## 2019-07-19 DIAGNOSIS — I6930 Unspecified sequelae of cerebral infarction: Secondary | ICD-10-CM | POA: Diagnosis not present

## 2019-07-19 DIAGNOSIS — F411 Generalized anxiety disorder: Secondary | ICD-10-CM | POA: Diagnosis not present

## 2019-07-19 DIAGNOSIS — I619 Nontraumatic intracerebral hemorrhage, unspecified: Secondary | ICD-10-CM | POA: Diagnosis not present

## 2019-07-19 DIAGNOSIS — Z0000 Encounter for general adult medical examination without abnormal findings: Secondary | ICD-10-CM | POA: Diagnosis not present

## 2019-07-19 DIAGNOSIS — E7800 Pure hypercholesterolemia, unspecified: Secondary | ICD-10-CM | POA: Diagnosis not present

## 2019-07-19 DIAGNOSIS — Z1389 Encounter for screening for other disorder: Secondary | ICD-10-CM | POA: Diagnosis not present

## 2019-07-19 DIAGNOSIS — I10 Essential (primary) hypertension: Secondary | ICD-10-CM | POA: Diagnosis not present

## 2019-07-19 DIAGNOSIS — M8588 Other specified disorders of bone density and structure, other site: Secondary | ICD-10-CM | POA: Diagnosis not present

## 2019-07-19 DIAGNOSIS — F329 Major depressive disorder, single episode, unspecified: Secondary | ICD-10-CM | POA: Diagnosis not present

## 2019-07-19 DIAGNOSIS — E039 Hypothyroidism, unspecified: Secondary | ICD-10-CM | POA: Diagnosis not present

## 2019-07-19 DIAGNOSIS — G4733 Obstructive sleep apnea (adult) (pediatric): Secondary | ICD-10-CM | POA: Diagnosis not present

## 2019-07-19 DIAGNOSIS — C50911 Malignant neoplasm of unspecified site of right female breast: Secondary | ICD-10-CM | POA: Diagnosis not present

## 2019-10-05 ENCOUNTER — Other Ambulatory Visit: Payer: Self-pay

## 2019-10-05 DIAGNOSIS — Z20822 Contact with and (suspected) exposure to covid-19: Secondary | ICD-10-CM

## 2019-10-07 LAB — NOVEL CORONAVIRUS, NAA: SARS-CoV-2, NAA: NOT DETECTED

## 2019-11-25 ENCOUNTER — Encounter (HOSPITAL_COMMUNITY): Payer: Self-pay | Admitting: Emergency Medicine

## 2019-11-25 ENCOUNTER — Emergency Department (HOSPITAL_COMMUNITY)
Admission: EM | Admit: 2019-11-25 | Discharge: 2019-11-25 | Disposition: A | Discharge status: Home / Self Care | Payer: PPO | Attending: Emergency Medicine | Admitting: Emergency Medicine

## 2019-11-25 ENCOUNTER — Emergency Department (HOSPITAL_COMMUNITY): Payer: PPO

## 2019-11-25 DIAGNOSIS — Z79899 Other long term (current) drug therapy: Secondary | ICD-10-CM | POA: Diagnosis not present

## 2019-11-25 DIAGNOSIS — E039 Hypothyroidism, unspecified: Secondary | ICD-10-CM | POA: Diagnosis not present

## 2019-11-25 DIAGNOSIS — N201 Calculus of ureter: Secondary | ICD-10-CM | POA: Diagnosis not present

## 2019-11-25 DIAGNOSIS — R001 Bradycardia, unspecified: Secondary | ICD-10-CM | POA: Diagnosis not present

## 2019-11-25 DIAGNOSIS — Z8673 Personal history of transient ischemic attack (TIA), and cerebral infarction without residual deficits: Secondary | ICD-10-CM | POA: Diagnosis not present

## 2019-11-25 DIAGNOSIS — R1030 Lower abdominal pain, unspecified: Secondary | ICD-10-CM | POA: Diagnosis present

## 2019-11-25 DIAGNOSIS — Z853 Personal history of malignant neoplasm of breast: Secondary | ICD-10-CM | POA: Insufficient documentation

## 2019-11-25 LAB — CBC
HCT: 42.3 % (ref 36.0–46.0)
Hemoglobin: 14.1 g/dL (ref 12.0–15.0)
MCH: 31.3 pg (ref 26.0–34.0)
MCHC: 33.3 g/dL (ref 30.0–36.0)
MCV: 94 fL (ref 80.0–100.0)
Platelets: 311 10*3/uL (ref 150–400)
RBC: 4.5 MIL/uL (ref 3.87–5.11)
RDW: 12.1 % (ref 11.5–15.5)
WBC: 10.5 10*3/uL (ref 4.0–10.5)
nRBC: 0 % (ref 0.0–0.2)

## 2019-11-25 LAB — URINALYSIS, ROUTINE W REFLEX MICROSCOPIC
Bilirubin Urine: NEGATIVE
Glucose, UA: NEGATIVE mg/dL
Ketones, ur: 20 mg/dL — AB
Nitrite: NEGATIVE
Protein, ur: 30 mg/dL — AB
RBC / HPF: >50 RBC/hpf — ABNORMAL HIGH (ref 0–5)
Specific Gravity, Urine: 1.015 (ref 1.005–1.030)
pH: 9 — ABNORMAL HIGH (ref 5.0–8.0)

## 2019-11-25 LAB — COMPREHENSIVE METABOLIC PANEL
ALT: 23 U/L (ref 0–44)
AST: 19 U/L (ref 15–41)
Albumin: 4.2 g/dL (ref 3.5–5.0)
Alkaline Phosphatase: 62 U/L (ref 38–126)
Anion gap: 11 (ref 5–15)
BUN: 22 mg/dL (ref 8–23)
CO2: 23 mmol/L (ref 22–32)
Calcium: 9.8 mg/dL (ref 8.9–10.3)
Chloride: 104 mmol/L (ref 98–111)
Creatinine, Ser: 0.95 mg/dL (ref 0.44–1.00)
GFR calc Af Amer: >60 mL/min (ref >60)
GFR calc non Af Amer: >60 mL/min (ref >60)
Glucose, Bld: 127 mg/dL — ABNORMAL HIGH (ref 70–99)
Potassium: 4 mmol/L (ref 3.5–5.1)
Sodium: 138 mmol/L (ref 135–145)
Total Bilirubin: 0.9 mg/dL (ref 0.3–1.2)
Total Protein: 7.3 g/dL (ref 6.5–8.1)

## 2019-11-25 LAB — LIPASE, BLOOD: Lipase: 29 U/L (ref 11–51)

## 2019-11-25 MED ORDER — SODIUM CHLORIDE 0.9 % IV SOLN
1000.0000 mL | INTRAVENOUS | Status: DC
  Administered 2019-11-25: 1000 mL via INTRAVENOUS

## 2019-11-25 MED ORDER — TAMSULOSIN HCL 0.4 MG PO CAPS: 0.4000 mg | ORAL_CAPSULE | Freq: Every day | ORAL | 0 refills | Status: AC

## 2019-11-25 MED ORDER — SODIUM CHLORIDE 0.9% FLUSH: 3.0000 mL | Freq: Once | INTRAVENOUS | Status: DC

## 2019-11-25 MED ORDER — SODIUM CHLORIDE 0.9 % IV BOLUS (SEPSIS)
500.0000 mL | Freq: Once | INTRAVENOUS | Status: AC
  Administered 2019-11-25: 500 mL via INTRAVENOUS

## 2019-11-25 MED ORDER — HYDROCODONE-ACETAMINOPHEN 5-325 MG PO TABS: 1.0000 | ORAL_TABLET | ORAL | 0 refills | Status: DC | PRN

## 2019-11-25 MED ORDER — ONDANSETRON HCL 4 MG/2ML IJ SOLN
4.0000 mg | Freq: Once | INTRAMUSCULAR | Status: AC
  Administered 2019-11-25: 4 mg via INTRAVENOUS
  Filled 2019-11-25: qty 2

## 2019-11-25 MED ORDER — FENTANYL CITRATE (PF) 100 MCG/2ML IJ SOLN
50.0000 ug | Freq: Once | INTRAMUSCULAR | Status: AC
  Administered 2019-11-25: 50 ug via INTRAVENOUS
  Filled 2019-11-25: qty 2

## 2019-11-25 MED ORDER — ONDANSETRON 8 MG PO TBDP: 8.0000 mg | ORAL_TABLET | Freq: Three times a day (TID) | ORAL | 0 refills | Status: DC | PRN

## 2019-11-25 NOTE — ED Notes (Signed)
Canceled code stroke with cassie from [facility]

## 2019-11-25 NOTE — ED Provider Notes (Signed)
[name] Provider Note   CSN: [id number] Arrival date & time: 11/25/19  1046     History Chief Complaint  Patient presents with  . Abdominal Pain  . Dizziness    [name] is a 69 y.o. female.  HPI   Patient presented to the emergency room for evaluation of abdominal pain.  Patient has a history of stroke.  This has left her with aphasia.  History was provided by both the patient as well as her husband.  The patient called for her husband this morning.  He found her lying on the floor.  Patient states she was feeling lightheaded and had to lie down but actually did not fall.  She began having trouble with nausea and vomiting and experienced pain in her abdomen.  Patient states the pain is on the right side.  She is not able to clarify the quality of the pain.  No known diarrhea or constipation.  No dysuria.  No fevers or chills.  No headache.  Past Medical History:  Diagnosis Date  . Anxiety   . Breast cancer ([place])   . GERD (gastroesophageal reflux disease)   . Hashimoto's thyroiditis    with goiter  . Headache(784.0)    hx migraines  . Hypercholesteremia   . Spina bifida ([place])    [place] L5  . 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  Narrowing of the 4th and 5th lumbar interspaces.   . Stroke ([place]) 11/2015  . Varicose vein     Patient Active Problem List   Diagnosis Date Noted  . OSA on CPAP 03/24/2019  . Genetic testing 03/06/2016  . Hemorrhagic stroke ([place]) 01/29/2016  . SAH (subarachnoid hemorrhage) ([place]) 01/29/2016  . Memory loss 01/29/2016  . Paroxysmal atrial fibrillation ([place]) 01/29/2016  . 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 ICH (intracerebral hemorrhage) ([place]) - R occipital 12/27/2015  . Atrial fibrillation with RVR ([place]) 12/27/2015  . Hyperlipidemia 12/27/2015  . Cognitive deficits 12/27/2015  . Hypothyroidism 12/27/2015  . Anxiety 12/27/2015  .  Subarachnoid hemorrhage ([place]) - L frontal 12/25/2015  . Cerebellar stroke ([place]) 12/02/2014  . Osteopenia 12/01/2014  . Rhinitis, chronic 01/13/2014  . 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 BREAST RECONSTRUCTION WITH PLACEMENT OF TISSUE EXPANDER AND FLEX HD (ACELLULAR HYDRATED DERMIS) Bilateral 10/13/2013   Procedure: PLACEMENT OF BILATERAL TISSUE EXPANDER FOR BREAST RECONSTRUCTION ;  Surgeon: Crissie Reese, MD;  Location: [facility];  Service: Plastics;  Laterality: Bilateral;  . CYSTOSCOPY WITH URETHRAL CARUNCLE    . HERNIA REPAIR  1976   Left groin  . MASTECTOMY W/ SENTINEL NODE BIOPSY Bilateral 10/13/2013   Procedure:  BILATERAL MASTECTOMY WITH RIGHT SENTINEL LYMPH NODE BIOPSY;  Surgeon: Merrie Roof, MD;  Location: [facility];  Service: General;  Laterality: Bilateral;  . PORT-A-CATH REMOVAL Left 12/21/2014   Procedure: MINOR REMOVAL OF PORT-A-CATH;  Surgeon: Autumn Messing III, MD;  Location: [facility];  Service: General;  Laterality: Left;  . PORTACATH PLACEMENT Left 10/13/2013   Procedure: INSERTION PORT-A-CATH;  Surgeon: Merrie Roof, MD;  Location: [facility];  Service: General;  Laterality: Left;  . TUBAL LIGATION    . [place] EXTRACTION  1990     OB History    Gravida  2  Para  2   Term      Preterm      AB      Living  2     SAB      TAB      Ectopic      Multiple      Live Births              Family History  Problem Relation Age of Onset  . Breast cancer Mother 19  . Lung cancer Father   . Cancer Father   . Heart attack Father   . Breast cancer Sister 35       Bilateral breast cancer with 2nd dx at 71  . Breast cancer Maternal Aunt 52  . Breast cancer Sister 59  . Lung cancer Maternal Uncle   . Leukemia Paternal Uncle   . Melanoma Brother 52       on ear  . Prostate cancer Brother 65  . Cancer Cousin         maternal cousin with cancer - NOS  . Hyperthyroidism Daughter     Social History   Tobacco Use  . Smoking status: Never Smoker  . Smokeless tobacco: Never Used  Substance Use Topics  . Alcohol use: Yes    Alcohol/week: 0.0 standard drinks    Comment: Rare  . Drug use: No    Home Medications Prior to Admission medications   Medication Sig Start Date End Date Taking? Authorizing Provider  acetaminophen (TYLENOL) 500 MG tablet Take 500 mg by mouth every 6 (six) hours as needed for headache (pain).   Yes [provider]  atorvastatin (LIPITOR) 20 MG tablet Take 20 mg by mouth at bedtime.    Yes [provider]  Biotin 5000 MCG TABS Take 5,000 mcg by mouth every morning.    Yes [provider]  CALCIUM PO Take 1 tablet by mouth every morning.   Yes [provider]  cholecalciferol 5000 UNITS TABS Take 1 tablet (5,000 Units total) by mouth daily. Patient taking differently: Take 5,000 Units by mouth every morning. Vitamin D3 10/03/15  Yes Magrinat, Virgie Dad, MD  DULoxetine (CYMBALTA) 30 MG capsule Take 30 mg by mouth every morning. Take with a 60 mg capsule for a total dose of 90 mg daily 10/30/19  Yes [provider]  DULoxetine (CYMBALTA) 60 MG capsule Take 60 mg by mouth every morning. Take with a 30 mg capsule for a total dose of 90 mg daily 02/10/19  Yes [provider]  Fexofenadine HCl (ALLEGRA PO) Take 1 tablet by mouth daily as needed (allergies).   Yes [provider]  fluticasone (FLONASE) 50 MCG/ACT nasal spray Place 1 spray into both nostrils daily as needed (allergies).    Yes [provider]  levothyroxine (SYNTHROID, LEVOTHROID) 50 MCG tablet Take 50 mcg by mouth daily before breakfast.    Yes Kelton Pillar, MD  lisinopril (PRINIVIL,ZESTRIL) 10 MG tablet Take 10 mg by mouth daily before breakfast.  02/16/19  Yes [provider]  LORazepam (ATIVAN) 0.5 MG tablet Take 0.5 mg by mouth 2 (two)  times daily.  03/02/19  Yes [provider]  Propylene Glycol (SYSTANE BALANCE) 0.6 % SOLN Place 1 drop into both eyes daily as needed (dry eyes).    Yes [provider]  anastrozole (ARIMIDEX) 1 MG tablet Take 1 tablet by mouth once daily Patient not taking: Reported on 11/25/2019 03/14/19   Magrinat, Virgie Dad, MD  fexofenadine Delma Freeze)  180 MG tablet Take 1 tablet (180 mg total) by mouth as needed. Patient not taking: Reported on 11/25/2019 03/15/18   Magrinat, Virgie Dad, MD  HYDROcodone-acetaminophen (NORCO/VICODIN) 5-325 MG tablet Take 1 tablet by mouth every 4 (four) hours as needed. 11/25/19   Dorie Rank, MD  ondansetron (ZOFRAN ODT) 8 MG disintegrating tablet Take 1 tablet (8 mg total) by mouth every 8 (eight) hours as needed for nausea or vomiting. 11/25/19   Dorie Rank, MD  tamsulosin (FLOMAX) 0.4 MG CAPS capsule Take 1 capsule (0.4 mg total) by mouth daily for 7 days. 11/25/19 12/02/19  Dorie Rank, MD    Allergies    Buprenex [buprenorphine], Simvastatin, and Codeine  Review of Systems   Review of Systems  All other systems reviewed and are negative.   Physical Exam Updated Vital Signs BP (!) 141/77   Pulse 75   Temp 98 F (36.7 C) (Oral)   Resp 13   LMP 11/24/2004   SpO2 100%   Physical Exam Vitals and nursing note reviewed.  Constitutional:      General: She is not in acute distress.    Appearance: She is well-developed.  HENT:     Head: Normocephalic and atraumatic.     Right Ear: External ear normal.     Left Ear: External ear normal.  Eyes:     General: No scleral icterus.       Right eye: No discharge.        Left eye: No discharge.     Conjunctiva/sclera: Conjunctivae normal.  Neck:     Trachea: No tracheal deviation.  Cardiovascular:     Rate and Rhythm: Normal rate and regular rhythm.  Pulmonary:     Effort: Pulmonary effort is normal. No respiratory distress.     Breath sounds: Normal breath sounds. No stridor. No wheezing or rales.  Abdominal:       General: Bowel sounds are normal. There is no distension.     Palpations: Abdomen is soft.     Tenderness: There is abdominal tenderness in the right upper quadrant and right lower quadrant. There is no guarding or rebound.  Musculoskeletal:        General: No tenderness.     Cervical back: Neck supple.  Skin:    General: Skin is warm and dry.     Findings: No rash.  Neurological:     Mental Status: She is alert.     Cranial Nerves: No cranial nerve deficit (no facial droop, extraocular movements intact, no slurred speech , aphasia present).     Sensory: No sensory deficit.     Motor: No abnormal muscle tone or seizure activity.     Coordination: Coordination normal.     ED Results / Procedures / Treatments   Labs (all labs ordered are listed, but only abnormal results are displayed) Labs Reviewed  COMPREHENSIVE METABOLIC PANEL - Abnormal; Notable for the following components:      Result Value   Glucose, Bld 127 (*)    All other components within normal limits  URINALYSIS, ROUTINE W REFLEX MICROSCOPIC - Abnormal; Notable for the following components:   APPearance CLOUDY (*)    pH 9.0 (*)    Hgb urine dipstick LARGE (*)    Ketones, ur 20 (*)    Protein, ur 30 (*)    Leukocytes,Ua TRACE (*)    RBC / HPF >50 (*)    Bacteria, UA RARE (*)    All other components within normal limits  LIPASE, BLOOD  CBC    EKG EKG Interpretation  Date/Time:  Friday November 25 2019 11:56:45 EST Ventricular Rate:  56 PR Interval:  126 QRS Duration: 82 QT Interval:  442 QTC Calculation: 426 R Axis:   72 Text Interpretation: Sinus bradycardia Otherwise normal ECG Since last tracing rate slower atrial fibrillation resolved Confirmed by Dorie Rank [phone]) on 11/25/2019 2:51:01 PM   Radiology CT Renal Stone Study  Result Date: 11/25/2019 CLINICAL DATA:  Flank pain on the right EXAM: CT ABDOMEN AND PELVIS WITHOUT CONTRAST TECHNIQUE: Multidetector CT imaging of the abdomen and pelvis was  performed following the standard protocol without IV contrast. COMPARISON:  None. FINDINGS: Lower chest: No acute abnormality. Hepatobiliary: No focal liver abnormality is seen. No gallstones, gallbladder wall thickening, or biliary dilatation. Pancreas: Unremarkable. No pancreatic ductal dilatation or surrounding inflammatory changes. Spleen: Normal in size without focal abnormality. Adrenals/Urinary Tract: Adrenal glands are within normal limits. The left kidney is unremarkable. Right kidney demonstrates evidence of hydronephrosis and hydroureter which extends into the proximal right ureter at which point a focal 6 mm obstructing stone is noted best seen on image number 43 of series 3. The bladder is partially distended. Stomach/Bowel: Colon is well visualized and within normal limits. No obstructive or inflammatory changes of the colon are seen. The appendix is within normal limits. No small bowel abnormality is seen. Vascular/Lymphatic: Aortic atherosclerosis. No enlarged abdominal or pelvic lymph nodes. Reproductive: Uterus and bilateral adnexa are unremarkable. Other: No abdominal wall hernia or abnormality. No abdominopelvic ascites. Musculoskeletal: Degenerative changes of the lumbar spine are noted. Anterolisthesis of L5 on S1 is noted. IMPRESSION: 6 mm proximal right ureteral stone with obstructive change. Electronically Signed   By: Inez Catalina M.D.   On: 11/25/2019 15:33    Procedures Procedures (including critical care time)  Medications Ordered in ED Medications  sodium chloride flush (NS) 0.9 % injection 3 mL (3 mLs Intravenous Not Given 11/25/19 1250)  sodium chloride 0.9 % bolus 500 mL (0 mLs Intravenous Stopped 11/25/19 1312)    Followed by  0.9 %  sodium chloride infusion (0 mLs Intravenous Stopped 11/25/19 1557)  fentaNYL (SUBLIMAZE) injection 50 mcg (50 mcg Intravenous Given 11/25/19 1257)  ondansetron (ZOFRAN) injection 4 mg (4 mg Intravenous Given 11/25/19 1257)    ED Course  I have  reviewed the triage vital signs and the nursing notes.  Pertinent labs & imaging results that were available during my care of the patient were reviewed by me and considered in my medical decision making (see chart for details).  Clinical Course as of Nov 24 1608  Fri Nov 25, 2019  1503 Urinalysis with hematuria.  Suggestive of possible ureterolithiasis.  I will order a CT scan   [JK]    Clinical Course User Index [JK] Dorie Rank, MD   MDM Rules/Calculators/A&P                     Patient presented to the ED for evaluation of acute right-sided abdominal pain with vomiting.  Her ED work-up is notable for a 6 mm right ureteral stone.  Patient's laboratory tests are otherwise unremarkable.  No signs of acute infection.  Patient was treated with pain medication and has remained stable and comfortable in the ED.  Plan on discharge home with prescription for hydrocodone and Zofran and Flomax.  Outpatient urology follow-up. Final Clinical Impression(s) / ED Diagnoses Final diagnoses:  Right ureteral stone    Rx / DC Orders ED  Discharge Orders         Ordered    HYDROcodone-acetaminophen (NORCO/VICODIN) 5-325 MG tablet  Every 4 hours PRN     11/25/19 1606    ondansetron (ZOFRAN ODT) 8 MG disintegrating tablet  Every 8 hours PRN     11/25/19 1606    tamsulosin (FLOMAX) 0.4 MG CAPS capsule  Daily     11/25/19 1606           Dorie Rank, MD 11/25/19 [phone]

## 2019-11-25 NOTE — ED Triage Notes (Signed)
Pt to ER - very difficult to understand in triage. This RN phoned husband, who was in the parking lot and has now come to patient side. He reports history of stroke in the past with residual difficult speech. This morning he reports she became dizzy suddenly with nausea and 3 episodes of vomiting with RLQ abdominal pain. Husband reports symptoms began suddenly this morning and she has been fine before today.

## 2019-11-25 NOTE — ED Notes (Signed)
Activated code stroke with phill from [facility].

## 2019-11-25 NOTE — ED Notes (Signed)
Pt had a emesis episode on the lobby

## 2019-11-25 NOTE — ED Notes (Signed)
ED Provider at bedside. 

## 2019-11-25 NOTE — Discharge Instructions (Signed)
Take the medications as needed for pain, take the medications as needed for nausea.  The Flomax is to help the kidney stone passed.  Return to the ED for fever worsening pain

## 2019-11-25 NOTE — ED Notes (Signed)
Patient verbalizes understanding of discharge instructions. Opportunity for questioning and answers were provided. Armband removed by staff, pt discharged from ED.  

## 2019-12-05 DIAGNOSIS — N132 Hydronephrosis with renal and ureteral calculous obstruction: Secondary | ICD-10-CM | POA: Diagnosis not present

## 2019-12-05 DIAGNOSIS — N201 Calculus of ureter: Secondary | ICD-10-CM | POA: Diagnosis not present

## 2019-12-21 DIAGNOSIS — N201 Calculus of ureter: Secondary | ICD-10-CM | POA: Diagnosis not present

## 2020-02-07 DIAGNOSIS — F411 Generalized anxiety disorder: Secondary | ICD-10-CM | POA: Diagnosis not present

## 2020-02-07 DIAGNOSIS — I10 Essential (primary) hypertension: Secondary | ICD-10-CM | POA: Diagnosis not present

## 2020-02-07 DIAGNOSIS — I6930 Unspecified sequelae of cerebral infarction: Secondary | ICD-10-CM | POA: Diagnosis not present

## 2020-02-07 DIAGNOSIS — E7800 Pure hypercholesterolemia, unspecified: Secondary | ICD-10-CM | POA: Diagnosis not present

## 2020-02-07 DIAGNOSIS — E039 Hypothyroidism, unspecified: Secondary | ICD-10-CM | POA: Diagnosis not present

## 2020-02-13 DIAGNOSIS — H2513 Age-related nuclear cataract, bilateral: Secondary | ICD-10-CM | POA: Diagnosis not present

## 2020-03-22 ENCOUNTER — Ambulatory Visit: Payer: Self-pay | Admitting: Family Medicine

## 2020-03-23 ENCOUNTER — Ambulatory Visit: Payer: Self-pay | Admitting: Family Medicine

## 2020-05-31 DIAGNOSIS — E039 Hypothyroidism, unspecified: Secondary | ICD-10-CM | POA: Diagnosis not present

## 2020-06-27 NOTE — Patient Instructions (Addendum)
Please continue using your CPAP regularly. While your insurance requires that you use CPAP at least 4 hours each night on 70% of the nights, I recommend, that you not skip any nights and use it throughout the night if you can. Getting used to CPAP and staying with the treatment long term does take time and patience and discipline. Untreated obstructive sleep apnea when it is moderate to severe can have an adverse impact on cardiovascular health and raise her risk for heart disease, arrhythmias, hypertension, congestive heart failure, stroke and diabetes. Untreated obstructive sleep apnea causes sleep disruption, nonrestorative sleep, and sleep deprivation. This can have an impact on your day to day functioning and cause daytime sleepiness and impairment of cognitive function, memory loss, mood disturbance, and problems focussing. Using CPAP regularly can improve these symptoms.   We will ask [facility] to assist you with a mask refitting. I will repeat download in 4-6 weeks to assess apneic events.   Stay well hydrated. Healthy, well balanced diet and regular exercise advised.   We will follow up pending next download.    Stroke Prevention Some medical conditions and lifestyle choices can lead to a higher risk for a stroke. You can help to prevent a stroke by making nutrition, lifestyle, and other changes. What nutrition changes can be made?   Eat healthy foods. ? Choose foods that are high in fiber. These include:  Fresh fruits.  Fresh vegetables.  Whole grains. ? Eat at least 5 or more servings of fruits and vegetables each day. Try to fill half of your plate at each meal with fruits and vegetables. ? Choose lean protein foods. These include:  Lowfat (lean) cuts of meat.  Chicken without skin.  Fish.  Tofu.  Beans.  Nuts. ? Eat low-fat dairy products. ? Avoid foods that:  Are high in salt (sodium).  Have saturated fat.  Have trans fat.  Have cholesterol.  Are  processed.  Are premade.  Follow eating guidelines as told by your doctor. These may include: ? Reducing how many calories you eat and drink each day. ? Limiting how much salt you eat or drink each day to 1,500 milligrams (mg). ? Using only healthy fats for cooking. These include:  Olive oil.  Canola oil.  Sunflower oil. ? Counting how many carbohydrates you eat and drink each day. What lifestyle changes can be made?  Try to stay at a healthy weight. Talk to your doctor about what a good weight is for you.  Get at least 30 minutes of moderate physical activity at least 5 days a week. This can include: ? Fast walking. ? Biking. ? Swimming.  Do not use any products that have nicotine or tobacco. This includes cigarettes and e-cigarettes. If you need help quitting, ask your doctor. Avoid being around tobacco smoke in general.  Limit how much alcohol you drink to no more than 1 drink a day for nonpregnant women and 2 drinks a day for men. One drink equals 12 oz of beer, 5 oz of wine, or 1 oz of hard liquor.  Do not use drugs.  Avoid taking birth control pills. Talk to your doctor about the risks of taking birth control pills if: ? You are over 60 years old. ? You smoke. ? You get migraines. ? You have had a blood clot. What other changes can be made?  Manage your cholesterol. ? It is important to eat a healthy diet. ? If your cholesterol cannot be managed through your  diet, you may also need to take medicines. Take medicines as told by your doctor.  Manage your diabetes. ? It is important to eat a healthy diet and to exercise regularly. ? If your blood sugar cannot be managed through diet and exercise, you may need to take medicines. Take medicines as told by your doctor.  Control your high blood pressure (hypertension). ? Try to keep your blood pressure below 130/80. This can help lower your risk of stroke. ? It is important to eat a healthy diet and to exercise  regularly. ? If your blood pressure cannot be managed through diet and exercise, you may need to take medicines. Take medicines as told by your doctor. ? Ask your doctor if you should check your blood pressure at home. ? Have your blood pressure checked every year. Do this even if your blood pressure is normal.  Talk to your doctor about getting checked for a sleep disorder. Signs of this can include: ? Snoring a lot. ? Feeling very tired.  Take over-the-counter and prescription medicines only as told by your doctor. These may include aspirin or blood thinners (antiplatelets or anticoagulants).  Make sure that any other medical conditions you have are managed. Where to find more information  American Stroke Association: www.strokeassociation.org  National Stroke Association: www.stroke.org Get help right away if:  You have any symptoms of stroke. "BE FAST" is an easy way to remember the main warning signs: ? B - Balance. Signs are dizziness, sudden trouble walking, or loss of balance. ? E - Eyes. Signs are trouble seeing or a sudden change in how you see. ? F - Face. Signs are sudden weakness or loss of feeling of the face, or the face or eyelid drooping on one side. ? A - Arms. Signs are weakness or loss of feeling in an arm. This happens suddenly and usually on one side of the body. ? S - Speech. Signs are sudden trouble speaking, slurred speech, or trouble understanding what people say. ? T - Time. Time to call emergency services. Write down what time symptoms started.  You have other signs of stroke, such as: ? A sudden, very bad headache with no known cause. ? Feeling sick to your stomach (nausea). ? Throwing up (vomiting). ? Jerky movements you cannot control (seizure). These symptoms may represent a serious problem that is an emergency. Do not wait to see if the symptoms will go away. Get medical help right away. Call your local emergency services (911 in the [place] Do not  drive yourself to the hospital. Summary  You can prevent a stroke by eating healthy, exercising, not smoking, drinking less alcohol, and treating other health problems, such as diabetes, high blood pressure, or high cholesterol.  Do not use any products that contain nicotine or tobacco, such as cigarettes and e-cigarettes.  Get help right away if you have any signs or symptoms of a stroke. This information is not intended to replace advice given to you by your health care provider. Make sure you discuss any questions you have with your health care provider. Document Revised: 01/06/2019 Document Reviewed: 02/11/2017 Elsevier Patient Education  [place].   Memory Compensation Strategies  1. Use "WARM" strategy.  W= write it down  A= associate it  R= repeat it  M= make a mental note  2.   You can keep a Social worker.  Use a 3-ring notebook with sections for the following: calendar, important names and phone numbers,  medications,  doctors' names/phone numbers, lists/reminders, and a section to journal what you did  each day.   3.    Use a calendar to write appointments down.  4.    Write yourself a schedule for the day.  This can be placed on the calendar or in a separate section of the Memory Notebook.  Keeping a  regular schedule can help memory.  5.    Use medication organizer with sections for each day or morning/evening pills.  You may need help loading it  6.    Keep a basket, or pegboard by the door.  Place items that you need to take out with you in the basket or on the pegboard.  You may also want to  include a message board for reminders.  7.    Use sticky notes.  Place sticky notes with reminders in a place where the task is performed.  For example: " turn off the  stove" placed by the stove, "lock the door" placed on the door at eye level, " take your medications" on  the bathroom mirror or by the place where you normally take your medications.  8.    Use  alarms/timers.  Use while cooking to remind yourself to check on food or as a reminder to take your medicine, or as a  reminder to make a call, or as a reminder to perform another task, etc.   Sleep Apnea Sleep apnea affects breathing during sleep. It causes breathing to stop for a short time or to become shallow. It can also increase the risk of:  Heart attack.  Stroke.  Being very overweight (obese).  Diabetes.  Heart failure.  Irregular heartbeat. The goal of treatment is to help you breathe normally again. What are the causes? There are three kinds of sleep apnea:  Obstructive sleep apnea. This is caused by a blocked or collapsed airway.  Central sleep apnea. This happens when the brain does not send the right signals to the muscles that control breathing.  Mixed sleep apnea. This is a combination of obstructive and central sleep apnea. The most common cause of this condition is a collapsed or blocked airway. This can happen if:  Your throat muscles are too relaxed.  Your tongue and tonsils are too large.  You are overweight.  Your airway is too small. What increases the risk?  Being overweight.  Smoking.  Having a small airway.  Being older.  Being female.  Drinking alcohol.  Taking medicines to calm yourself (sedatives or tranquilizers).  Having family members with the condition. What are the signs or symptoms?  Trouble staying asleep.  Being sleepy or tired during the day.  Getting angry a lot.  Loud snoring.  Headaches in the morning.  Not being able to focus your mind (concentrate).  Forgetting things.  Less interest in sex.  Mood swings.  Personality changes.  Feelings of sadness (depression).  Waking up a lot during the night to pee (urinate).  Dry mouth.  Sore throat. How is this diagnosed?  Your medical history.  A physical exam.  A test that is done when you are sleeping (sleep study). The test is most often done in a  sleep lab but may also be done at home. How is this treated?   Sleeping on your side.  Using a medicine to get rid of mucus in your nose (decongestant).  Avoiding the use of alcohol, medicines to help you relax, or certain pain medicines (narcotics).  Losing  weight, if needed.  Changing your diet.  Not smoking.  Using a machine to open your airway while you sleep, such as: ? An oral appliance. This is a mouthpiece that shifts your lower jaw forward. ? A CPAP device. This device blows air through a mask when you breathe out (exhale). ? An EPAP device. This has valves that you put in each nostril. ? A BPAP device. This device blows air through a mask when you breathe in (inhale) and breathe out.  Having surgery if other treatments do not work. It is important to get treatment for sleep apnea. Without treatment, it can lead to:  High blood pressure.  Coronary artery disease.  In men, not being able to have an erection (impotence).  Reduced thinking ability. Follow these instructions at home: Lifestyle  Make changes that your doctor recommends.  Eat a healthy diet.  Lose weight if needed.  Avoid alcohol, medicines to help you relax, and some pain medicines.  Do not use any products that contain nicotine or tobacco, such as cigarettes, e-cigarettes, and chewing tobacco. If you need help quitting, ask your doctor. General instructions  Take over-the-counter and prescription medicines only as told by your doctor.  If you were given a machine to use while you sleep, use it only as told by your doctor.  If you are having surgery, make sure to tell your doctor you have sleep apnea. You may need to bring your device with you.  Keep all follow-up visits as told by your doctor. This is important. Contact a doctor if:  The machine that you were given to use during sleep bothers you or does not seem to be working.  You do not get better.  You get worse. Get help right  away if:  Your chest hurts.  You have trouble breathing in enough air.  You have an uncomfortable feeling in your back, arms, or stomach.  You have trouble talking.  One side of your body feels weak.  A part of your face is hanging down. These symptoms may be an emergency. Do not wait to see if the symptoms will go away. Get medical help right away. Call your local emergency services (911 in the [place] Do not drive yourself to the hospital. Summary  This condition affects breathing during sleep.  The most common cause is a collapsed or blocked airway.  The goal of treatment is to help you breathe normally while you sleep. This information is not intended to replace advice given to you by your health care provider. Make sure you discuss any questions you have with your health care provider. Document Revised: 08/27/2018 Document Reviewed: 07/06/2018 Elsevier Patient Education  [place].
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
## 2020-06-28 ENCOUNTER — Ambulatory Visit: Payer: PPO | Admitting: Family Medicine

## 2020-06-28 ENCOUNTER — Encounter: Payer: Self-pay | Admitting: Family Medicine

## 2020-06-28 ENCOUNTER — Other Ambulatory Visit: Payer: Self-pay

## 2020-06-28 VITALS — BP 123/73 | HR 92 | Ht 66.0 in | Wt 139.0 lb

## 2020-06-28 DIAGNOSIS — I680 Cerebral amyloid angiopathy: Secondary | ICD-10-CM

## 2020-06-28 DIAGNOSIS — Z9989 Dependence on other enabling machines and devices: Secondary | ICD-10-CM

## 2020-06-28 DIAGNOSIS — E854 Organ-limited amyloidosis: Secondary | ICD-10-CM | POA: Diagnosis not present

## 2020-06-28 DIAGNOSIS — I609 Nontraumatic subarachnoid hemorrhage, unspecified: Secondary | ICD-10-CM

## 2020-06-28 DIAGNOSIS — G4733 Obstructive sleep apnea (adult) (pediatric): Secondary | ICD-10-CM

## 2020-06-29 NOTE — Progress Notes (Signed)
I reviewed note and agree with plan.   Penni Bombard, MD 05/26/2201, 54:27 PM Certified in Neurology, Neurophysiology and Neuroimaging  [facility] [address], [place], [place] [address] [phone]

## 2020-07-18 ENCOUNTER — Telehealth: Payer: Self-pay | Admitting: Family Medicine

## 2020-07-18 NOTE — Telephone Encounter (Signed)
I called and spoke to Marchelle Gearing with [facility] and relayed that order for pt  in epic from 06-28-20, she did not see, but she would take care of this and call husband of pt about cpap.   I relayed this to husband and verbalized understanding.  I relayed apology about delay.

## 2020-07-18 NOTE — Telephone Encounter (Signed)
Pt's spouse called wanting to know what the update is on the call that was to be made to the DME on the pt's CPAP Machine. He states this was spoken about 2 weeks ago. Please advise.

## 2020-08-09 DIAGNOSIS — M8588 Other specified disorders of bone density and structure, other site: Secondary | ICD-10-CM | POA: Diagnosis not present

## 2020-08-09 DIAGNOSIS — I480 Paroxysmal atrial fibrillation: Secondary | ICD-10-CM | POA: Diagnosis not present

## 2020-08-09 DIAGNOSIS — E039 Hypothyroidism, unspecified: Secondary | ICD-10-CM | POA: Diagnosis not present

## 2020-08-09 DIAGNOSIS — I10 Essential (primary) hypertension: Secondary | ICD-10-CM | POA: Diagnosis not present

## 2020-08-09 DIAGNOSIS — G4700 Insomnia, unspecified: Secondary | ICD-10-CM | POA: Diagnosis not present

## 2020-08-09 DIAGNOSIS — Z1389 Encounter for screening for other disorder: Secondary | ICD-10-CM | POA: Diagnosis not present

## 2020-08-09 DIAGNOSIS — Z0000 Encounter for general adult medical examination without abnormal findings: Secondary | ICD-10-CM | POA: Diagnosis not present

## 2020-08-09 DIAGNOSIS — E7800 Pure hypercholesterolemia, unspecified: Secondary | ICD-10-CM | POA: Diagnosis not present

## 2020-08-09 DIAGNOSIS — Z23 Encounter for immunization: Secondary | ICD-10-CM | POA: Diagnosis not present

## 2020-08-09 DIAGNOSIS — H6121 Impacted cerumen, right ear: Secondary | ICD-10-CM | POA: Diagnosis not present

## 2020-08-09 DIAGNOSIS — K219 Gastro-esophageal reflux disease without esophagitis: Secondary | ICD-10-CM | POA: Diagnosis not present

## 2020-08-09 DIAGNOSIS — F329 Major depressive disorder, single episode, unspecified: Secondary | ICD-10-CM | POA: Diagnosis not present

## 2020-10-16 ENCOUNTER — Encounter: Payer: Self-pay | Admitting: Diagnostic Neuroimaging

## 2020-10-16 ENCOUNTER — Other Ambulatory Visit: Payer: Self-pay

## 2020-10-16 ENCOUNTER — Ambulatory Visit: Payer: PPO | Admitting: Diagnostic Neuroimaging

## 2020-10-16 VITALS — BP 120/74 | HR 78 | Ht 66.0 in | Wt 140.0 lb

## 2020-10-16 DIAGNOSIS — F0390 Unspecified dementia without behavioral disturbance: Secondary | ICD-10-CM | POA: Diagnosis not present

## 2020-10-16 DIAGNOSIS — I680 Cerebral amyloid angiopathy: Secondary | ICD-10-CM

## 2020-10-16 DIAGNOSIS — E854 Organ-limited amyloidosis: Secondary | ICD-10-CM

## 2020-10-16 DIAGNOSIS — F03B0 Unspecified dementia, moderate, without behavioral disturbance, psychotic disturbance, mood disturbance, and anxiety: Secondary | ICD-10-CM
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
## 2020-10-16 NOTE — Progress Notes (Addendum)
Community message sent to Charmian Muff, Ileene Musa , [employer] to check on orders from NP placed on 06/28/20.  10/22/20 received message form Charmian Muff, [employer]: they reached out twice to patient re: CPAP order on 8/5/221. They will reach out again.

## 2020-10-16 NOTE — Patient Instructions (Signed)
MODERATE-SEVERE DEMENTIA - safety / supervision issues reviewed - daily physical activity / exercise (at least 15-30 minutes) - eat more plants / vegetables - increase social activities, brain stimulation, games, puzzles, hobbies, crafts, arts, music - aim for at least 7-8 hours sleep per night (or more) - avoid smoking and alcohol - caregiver resources provided - no driving; cannot handle finances or medications

## 2020-11-12 DIAGNOSIS — G4733 Obstructive sleep apnea (adult) (pediatric): Secondary | ICD-10-CM | POA: Diagnosis not present

## 2021-02-08 DIAGNOSIS — I10 Essential (primary) hypertension: Secondary | ICD-10-CM | POA: Diagnosis not present

## 2021-02-08 DIAGNOSIS — F411 Generalized anxiety disorder: Secondary | ICD-10-CM | POA: Diagnosis not present

## 2021-02-08 DIAGNOSIS — E039 Hypothyroidism, unspecified: Secondary | ICD-10-CM | POA: Diagnosis not present

## 2021-02-08 DIAGNOSIS — I480 Paroxysmal atrial fibrillation: Secondary | ICD-10-CM | POA: Diagnosis not present

## 2021-02-08 DIAGNOSIS — E7800 Pure hypercholesterolemia, unspecified: Secondary | ICD-10-CM | POA: Diagnosis not present

## 2021-02-08 DIAGNOSIS — Z853 Personal history of malignant neoplasm of breast: Secondary | ICD-10-CM | POA: Diagnosis not present

## 2021-02-08 DIAGNOSIS — I6930 Unspecified sequelae of cerebral infarction: Secondary | ICD-10-CM | POA: Diagnosis not present

## 2021-02-12 DIAGNOSIS — H2513 Age-related nuclear cataract, bilateral: Secondary | ICD-10-CM | POA: Diagnosis not present

## 2021-06-17 DIAGNOSIS — L259 Unspecified contact dermatitis, unspecified cause: Secondary | ICD-10-CM | POA: Diagnosis not present

## 2021-08-13 DIAGNOSIS — E7800 Pure hypercholesterolemia, unspecified: Secondary | ICD-10-CM | POA: Diagnosis not present

## 2021-08-13 DIAGNOSIS — I4891 Unspecified atrial fibrillation: Secondary | ICD-10-CM | POA: Diagnosis not present

## 2021-08-13 DIAGNOSIS — I10 Essential (primary) hypertension: Secondary | ICD-10-CM | POA: Diagnosis not present

## 2021-08-13 DIAGNOSIS — E039 Hypothyroidism, unspecified: Secondary | ICD-10-CM | POA: Diagnosis not present

## 2021-08-13 DIAGNOSIS — F329 Major depressive disorder, single episode, unspecified: Secondary | ICD-10-CM | POA: Diagnosis not present

## 2021-08-13 DIAGNOSIS — F411 Generalized anxiety disorder: Secondary | ICD-10-CM | POA: Diagnosis not present

## 2021-08-13 DIAGNOSIS — Z23 Encounter for immunization: Secondary | ICD-10-CM | POA: Diagnosis not present

## 2021-08-13 DIAGNOSIS — I6930 Unspecified sequelae of cerebral infarction: Secondary | ICD-10-CM | POA: Diagnosis not present

## 2021-08-13 DIAGNOSIS — R413 Other amnesia: Secondary | ICD-10-CM | POA: Diagnosis not present

## 2021-08-13 DIAGNOSIS — G4733 Obstructive sleep apnea (adult) (pediatric): Secondary | ICD-10-CM | POA: Diagnosis not present

## 2021-08-13 DIAGNOSIS — Z853 Personal history of malignant neoplasm of breast: Secondary | ICD-10-CM | POA: Diagnosis not present

## 2021-08-13 DIAGNOSIS — Z0000 Encounter for general adult medical examination without abnormal findings: Secondary | ICD-10-CM | POA: Diagnosis not present

## 2021-10-15 NOTE — Patient Instructions (Incomplete)
Below is our plan: ? ?We will *** ? ?Please make sure you are staying well hydrated. I recommend 50-60 ounces daily. Well balanced diet and regular exercise encouraged. Consistent sleep schedule with 6-8 hours recommended.  ? ?Please continue follow up with care team as directed.  ? ?Follow up with *** in *** ? ?You may receive a survey regarding today's visit. I encourage you to leave honest feed back as I do use this information to improve patient care. Thank you for seeing me today!  ? ?Management of Memory Problems ?  ?There are some general things you can do to help manage your memory problems.  Your memory may not in fact recover, but by using techniques and strategies you will be able to manage your memory difficulties better. ?  ?1)  Establish a routine. ?Try to establish and then stick to a regular routine.  By doing this, you will get used to what to expect and you will reduce the need to rely on your memory.  Also, try to do things at the same time of day, such as taking your medication or checking your calendar first thing in the morning. ?Think about think that you can do as a part of a regular routine and make a list.  Then enter them into a daily planner to remind you.  This will help you establish a routine. ?  ?2)  Organize your environment. ?Organize your environment so that it is uncluttered.  Decrease visual stimulation.  Place everyday items such as keys or cell phone in the same place every day (ie.  Basket next to front door) ?Use post it notes with a brief message to yourself (ie. Turn off light, lock the door) ?Use labels to indicate where things go (ie. Which cupboards are for food, dishes, etc.) ?Keep a notepad and pen by the telephone to take messages ?  ?3)  Memory Aids ?A diary or journal/notebook/daily planner ?Making a list (shopping list, chore list, to do list that needs to be done) ?Using an alarm as a reminder (kitchen timer or cell phone alarm) ?Using cell phone to store  information (Notes, Calendar, Reminders) ?Calendar/White board placed in a prominent position ?Post-it notes ?  ?In order for memory aids to be useful, you need to have good habits.  It's no good remembering to make a note in your journal if you don't remember to look in it.  Try setting aside a certain time of day to look in journal. ?  ?4)  Improving mood and managing fatigue. ?There may be other factors that contribute to memory difficulties.  Factors, such as anxiety, depression and tiredness can affect memory. ?Regular gentle exercise can help improve your mood and give you more energy. ?Simple relaxation techniques may help relieve symptoms of anxiety ?Try to get back to completing activities or hobbies you enjoyed doing in the past. ?Learn to pace yourself through activities to decrease fatigue. ?Find out about some local support groups where you can share experiences with others. ?Try and achieve 7-8 hours of sleep at night. ? ?
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
## 2021-10-16 ENCOUNTER — Telehealth: Payer: Self-pay | Admitting: Family Medicine

## 2021-10-16 ENCOUNTER — Telehealth: Payer: Self-pay | Admitting: Neurology

## 2021-10-16 ENCOUNTER — Ambulatory Visit: Payer: PPO | Admitting: Family Medicine

## 2021-10-16 DIAGNOSIS — I680 Cerebral amyloid angiopathy: Secondary | ICD-10-CM

## 2021-10-16 DIAGNOSIS — G4733 Obstructive sleep apnea (adult) (pediatric): Secondary | ICD-10-CM

## 2021-10-16 DIAGNOSIS — F03B0 Unspecified dementia, moderate, without behavioral disturbance, psychotic disturbance, mood disturbance, and anxiety: Secondary | ICD-10-CM

## 2021-10-16 NOTE — Telephone Encounter (Signed)
Called the pt to reschedule her apt for today due to provider being out. There was no answer. LVM for the pt to call back to reschedule. We can get the patient worked in if she needs to be seen asap. Apt should be with NP (any NP).

## 2021-10-16 NOTE — Telephone Encounter (Signed)
Called to r/s pt's 11/23 appt due to Amy having to leave the office early, spoke with pt's husband who states pt is refusing CPAP and that there is no need to r/s at this time.

## 2022-02-13 DIAGNOSIS — E039 Hypothyroidism, unspecified: Secondary | ICD-10-CM | POA: Diagnosis not present

## 2022-02-13 DIAGNOSIS — I10 Essential (primary) hypertension: Secondary | ICD-10-CM | POA: Diagnosis not present

## 2022-02-13 DIAGNOSIS — E7800 Pure hypercholesterolemia, unspecified: Secondary | ICD-10-CM | POA: Diagnosis not present

## 2022-02-13 DIAGNOSIS — R159 Full incontinence of feces: Secondary | ICD-10-CM | POA: Diagnosis not present

## 2022-02-13 DIAGNOSIS — F411 Generalized anxiety disorder: Secondary | ICD-10-CM | POA: Diagnosis not present

## 2022-02-18 DIAGNOSIS — H2513 Age-related nuclear cataract, bilateral: Secondary | ICD-10-CM | POA: Diagnosis not present

## 2022-06-09 DIAGNOSIS — E039 Hypothyroidism, unspecified: Secondary | ICD-10-CM | POA: Diagnosis not present

## 2022-06-09 DIAGNOSIS — I10 Essential (primary) hypertension: Secondary | ICD-10-CM | POA: Diagnosis not present

## 2022-06-09 DIAGNOSIS — E7800 Pure hypercholesterolemia, unspecified: Secondary | ICD-10-CM | POA: Diagnosis not present

## 2022-06-09 DIAGNOSIS — F411 Generalized anxiety disorder: Secondary | ICD-10-CM | POA: Diagnosis not present

## 2022-06-09 DIAGNOSIS — R413 Other amnesia: Secondary | ICD-10-CM | POA: Diagnosis not present

## 2022-06-09 DIAGNOSIS — I6930 Unspecified sequelae of cerebral infarction: Secondary | ICD-10-CM | POA: Diagnosis not present

## 2022-06-10 ENCOUNTER — Telehealth: Payer: Self-pay

## 2022-06-10 NOTE — Telephone Encounter (Signed)
Attempted to contact patient to schedule a Palliative Care consult appointment. No answer left a message to return call.  

## 2022-06-11 ENCOUNTER — Telehealth: Payer: Self-pay

## 2022-06-11 NOTE — Telephone Encounter (Signed)
Spoke with patient's spouse Percell Miller and scheduled a in person Palliative Consult for 06/23/22 @ 11 AM per request.   Consent obtained; updated Netsmart, Team List and Epic.

## 2022-06-16 DIAGNOSIS — L259 Unspecified contact dermatitis, unspecified cause: Secondary | ICD-10-CM | POA: Diagnosis not present

## 2022-06-23 ENCOUNTER — Other Ambulatory Visit: Payer: PPO | Admitting: Hospice

## 2022-06-23 ENCOUNTER — Encounter: Payer: Self-pay | Admitting: Oncology

## 2022-06-23 DIAGNOSIS — F411 Generalized anxiety disorder: Secondary | ICD-10-CM

## 2022-06-23 DIAGNOSIS — Z515 Encounter for palliative care: Secondary | ICD-10-CM | POA: Diagnosis not present

## 2022-06-23 DIAGNOSIS — F03918 Unspecified dementia, unspecified severity, with other behavioral disturbance: Secondary | ICD-10-CM | POA: Diagnosis not present

## 2022-06-23 DIAGNOSIS — R451 Restlessness and agitation: Secondary | ICD-10-CM | POA: Diagnosis not present

## 2022-06-23 NOTE — Progress Notes (Signed)
[facility] Consult Note Telephone: [phone]  Fax: [phone]  PATIENT NAME: Barbara [name] [address] [place] [address] [phone] (home)  DOB: [birth year] MRN: [id number]  PRIMARY CARE PROVIDER:    Kelton Pillar, MD,  [place] [address] [place] [address] [phone]  REFERRING PROVIDER:   Kelton Pillar, MD [place] [address] [place],  [place] [address] [phone]  RESPONSIBLE PARTY:    Contact Information     Name Relation Home Work Mobile   Barbara [name],Barbara [name] [phone]  [phone]        I met face to face with patient and family at home. Visit to build trust and highlight Palliative Medicine as specialized medical care for people living with serious illness, aimed at facilitating better quality of life through symptoms relief, assisting with advance care planning and complex medical decision making. Ed  ASSESSMENT AND / RECOMMENDATIONS:   Advance Care Planning: Our advance care planning conversation included a discussion about:    The value and importance of advance care planning  Difference between Hospice and Palliative care Exploration of goals of care in the event of a sudden injury or illness  Identification and preparation of a healthcare agent  Review and updating or creation of an  advance directive document . Decision not to resuscitate or to de-escalate disease focused treatments due to poor prognosis.  CODE STATUS: Patient is a DO NOT RESUSCITATE.  NP signed DNR for patient to keep at home.  Goals of Care: Goals include to maximize quality of life and symptom management  I spent  16 minutes providing this initial consultation. More than 50% of the time in this consultation was spent on counseling patient and coordinating  communication. 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 Cognitive deficits, short term memory loss. Continue appointments with Neurologist as planned.  Agitation: related Dementia - restlessness, wandering, sun dawning. Continue Seroquel as ordered.  Deescalation techniques Anxiety: Generalized. Continue Lorazepam as ordered. Encourage slow deep breathing; deescalation techniques. Patient is a Duloxetine, tapering from 80 mg to currently 30 mg daily with hope to discontinue. No decompensation at this time.  Hypertension: Continue  Lisinopril.  Routine CBC CMP.  Follow up: Palliative care will continue to follow for complex medical decision making, advance care planning, and clarification of goals. Return 6 weeks or prn. Encouraged to call provider sooner with any concerns.   Family /Caregiver/Community Supports:  Patient lives at home with her spouse.  Daughter in law is hospice NP.  Strong family support system identified.  HOSPICE ELIGIBILITY/DIAGNOSIS: TBD  Chief Complaint: Initial Palliative care visit  HISTORY OF PRESENT ILLNESS:  Barbara [name] is a 71 y.o. year old female  with multiple morbidities requiring close monitoring and with high risk of complications and  mortality:  Dementia, Hypertension, Anxiety, Hypothyroidism. History of breast cancer in remission.  History obtained from review of EMR, discussion with primary team, caregiver, family and/or Barbara [name].  Review and summarization of Epic records shows history from other than patient. Rest of 10 point ROS asked and negative. Independent interpretation of tests and reviewed as needed, available labs, patient records, imaging, studies and related documents from the EMR.  Family has set up caregivers to help with ADLs 3 days a day,  5 hours each day.   PAST MEDICAL HISTORY:  Active Ambulatory Problems    Diagnosis Date Noted   Malignant neoplasm of upper-outer  quadrant of right breast in female, estrogen receptor positive ([facility]) 09/26/2013   Rhinitis, chronic 01/13/2014   Osteopenia 12/01/2014   Cerebellar stroke ([facility]) 12/02/2014   Subarachnoid hemorrhage ([facility]) - L frontal 12/25/2015   ICH (intracerebral hemorrhage) ([facility]) - R occipital 12/27/2015   Atrial fibrillation with RVR ([facility]) 12/27/2015   Hyperlipidemia 12/27/2015   Cognitive deficits 12/27/2015   Hypothyroidism 12/27/2015   Anxiety 12/27/2015   Hemorrhagic stroke ([facility]) 01/29/2016   SAH (subarachnoid hemorrhage) ([facility]) 01/29/2016   Memory loss 01/29/2016   Paroxysmal atrial fibrillation ([facility]) 01/29/2016   Cerebral amyloid angiopathy ([facility]) 01/29/2016   Genetic testing 03/06/2016   OSA on CPAP 03/24/2019   Resolved Ambulatory Problems    Diagnosis Date Noted   No Resolved Ambulatory Problems   Past Medical History:  Diagnosis Date   Breast cancer ([facility])    GERD (gastroesophageal reflux disease)    Hashimoto's thyroiditis    Headache(784.0)    Hypercholesteremia    Spina bifida ([facility])    Spondylarthritis    Stroke ([facility]) 11/2015   Varicose vein     SOCIAL HX:  Social History   Tobacco Use   Smoking status: Never   Smokeless tobacco: Never  Substance Use Topics   Alcohol use: Yes    Alcohol/week: 0.0 standard drinks of alcohol    Comment: Rare     FAMILY HX:  Family History  Problem Relation Age of Onset   Breast cancer Mother 58   Lung cancer Father    Cancer Father    Heart attack Father    Breast cancer Sister 73       Bilateral breast cancer with 2nd dx at 10   Breast cancer Maternal Aunt 52   Breast cancer Sister 87   Lung cancer Maternal Uncle    Leukemia Paternal Uncle    Melanoma Brother 1       on ear   Prostate cancer Brother 31   Cancer Cousin        maternal cousin with cancer - NOS   Hyperthyroidism Daughter        ALLERGIES:  Allergies  Allergen Reactions   Buprenex [Buprenorphine] Nausea And Vomiting and Other (See Comments)    Oversedation   Simvastatin Other (See Comments)    Myalgias    Codeine Hives, Itching and Rash      PERTINENT MEDICATIONS:  Outpatient Encounter Medications as of 06/23/2022  Medication Sig   acetaminophen (TYLENOL) 500 MG tablet Take 500 mg by mouth every 6 (six) hours as needed for headache (pain).   atorvastatin (LIPITOR) 20 MG tablet Take 20 mg by mouth at bedtime.    Biotin 5000 MCG TABS Take 5,000 mcg by mouth every morning.    CALCIUM PO Take 1 tablet by mouth every morning.   cholecalciferol 5000 UNITS TABS Take 1 tablet (5,000 Units total) by mouth daily. (Patient taking differently: Take 5,000 Units by mouth every morning. Vitamin D3)   DULoxetine (CYMBALTA) 30 MG capsule Take 30 mg by mouth every morning. Take with a 60 mg capsule for a total dose of 90 mg daily   DULoxetine (CYMBALTA) 60 MG capsule Take 60 mg by mouth every morning. Take with a 30 mg capsule for a total dose of 90 mg daily   fexofenadine (ALLEGRA) 180 MG tablet Take 1 tablet (180 mg total) by mouth as needed.   Fexofenadine HCl (ALLEGRA PO) Take 1 tablet by mouth daily as needed (allergies).   fluticasone (FLONASE) 50 MCG/ACT nasal spray  Place 1 spray into both nostrils daily as needed (allergies).    HYDROcodone-acetaminophen (NORCO/VICODIN) 5-325 MG tablet Take 1 tablet by mouth every 4 (four) hours as needed.   levothyroxine (SYNTHROID, LEVOTHROID) 50 MCG tablet Take 50 mcg by mouth daily before breakfast.    lisinopril (PRINIVIL,ZESTRIL) 10 MG tablet Take 10 mg by mouth daily before breakfast.    LORazepam (ATIVAN) 0.5 MG tablet Take 0.5 mg by mouth 2 (two) times daily.    Propylene Glycol (SYSTANE BALANCE) 0.6 % SOLN Place 1 drop into both eyes daily as needed (dry eyes).    No facility-administered encounter medications on file as of 06/23/2022.     Thank you for the opportunity to  participate in the care of Ms. Rizzo.  The palliative care team will continue to follow. Please call our office at [phone] if we can be of additional assistance.   Note: Portions of this note were generated with Lobbyist. Dictation errors may occur despite best attempts at proofreading.  Teodoro Spray, NP

## 2022-07-21 ENCOUNTER — Other Ambulatory Visit: Payer: PPO | Admitting: Hospice

## 2022-07-21 DIAGNOSIS — R451 Restlessness and agitation: Secondary | ICD-10-CM | POA: Diagnosis not present

## 2022-07-21 DIAGNOSIS — F03918 Unspecified dementia, unspecified severity, with other behavioral disturbance: Secondary | ICD-10-CM | POA: Diagnosis not present

## 2022-07-21 DIAGNOSIS — Z515 Encounter for palliative care: Secondary | ICD-10-CM

## 2022-07-21 DIAGNOSIS — F324 Major depressive disorder, single episode, in partial remission: Secondary | ICD-10-CM

## 2022-07-21 DIAGNOSIS — F411 Generalized anxiety disorder: Secondary | ICD-10-CM | POA: Diagnosis not present

## 2022-07-21 NOTE — Progress Notes (Signed)
[facility] Consult Note Telephone: [phone]  Fax: [phone]  PATIENT NAME: Barbara [name] [address] [place] [address] [phone] (home)  DOB: [birth year] MRN: [id number]  PRIMARY CARE PROVIDER:    Kelton Pillar, MD,  [place] [address] [place] [address] [phone]  REFERRING PROVIDER:   Kelton Pillar, MD [place] [address] [place],  [place] [address] [phone]  RESPONSIBLE PARTY:   Stacie Glaze Information     Name Relation Home Work Mobile   Zietz,Edward Spouse [phone]  [phone]       TELEHEALTH VISIT STATEMENT Due to the COVID-19 crisis, this visit was done via telemedicine from my office and it was initiated and consent by this patient and or family.  I connected with patient OR PROXY by a telephone/video  and verified that I am speaking with the correct person. I discussed the limitations of evaluation and management by telemedicine. Patient/proxy expressed understanding and agreed to proceed. Palliative Care was asked to follow this patient to address advance care planning, complex medical decision making and goals of care clarification.   Barbara [name] is with patient during visit.   Visit consisted of counseling and education dealing with the complex and emotionally intense issues of symptom management and palliative care in the setting of serious and potentially life-threatening illness. Palliative care team will continue to support patient, patient's family, and medical team. ASSESSMENT AND / RECOMMENDATIONS:   Advance Care Planning: Our advance care planning conversation included a discussion about:     CODE STATUS: Patient is a DO NOT RESUSCITATE.  NP signed DNR for patient to keep at home.  Goals of Care: Goals include to maximize quality of life and symptom management  Symptom Management/Plan: Vascular Dementia: related to cerebral amyloid  angiopathy/stroke 2015. Occasional bowel incontinence, FAST D, cognitive deficits, short term memory loss.  Continue ongoing supportive care.  Follow-up with neurologist as planned.  Agitation: restlessness, wandering, sun dawning related to dementia. Continue Seroquel as ordered.  Deescalation techniques Anxiety: Generalized. Continue Lorazepam as ordered. Encourage slow deep breathing; deescalation techniques.  Depression: Improving. Patient is a Duloxetine, tapering from 90 mg to currently 30 mg daily with hope to discontinue. No decompensation at this time.  Hypertension: Continue  Lisinopril.  F/u with PCP 08/18/22. Routine CBC CMP.  Follow up: Palliative care will continue to follow for complex medical decision making, advance care planning, and clarification of goals. Return 6 weeks or prn. Encouraged to call provider sooner with any concerns.   Family /Caregiver/Community Supports:  Patient lives at home with her spouse.  Daughter in law is hospice NP. Strong family support system identified.  HOSPICE ELIGIBILITY/DIAGNOSIS: TBD  Chief Complaint: Follow up visit  HISTORY OF PRESENT ILLNESS:  Barbara [name] is a 71 y.o. year old female  with multiple morbidities requiring close monitoring and with high risk of complications and  mortality:  Dementia, Hypertension, Anxiety, Hypothyroidism. History of breast cancer in remission.  History obtained from review of EMR, discussion with primary team, caregiver, family and/or Barbara [name].  Review and summarization of Epic records shows history from other than patient. Rest of 10 point ROS asked and negative. Independent interpretation of tests and reviewed as needed, available labs, patient records, imaging, studies and related documents from the EMR.  PAST MEDICAL HISTORY:  Active Ambulatory Problems    Diagnosis Date Noted   Malignant neoplasm of upper-outer quadrant of right breast in female, estrogen receptor positive ([facility]) 09/26/2013  Rhinitis, chronic 01/13/2014   Osteopenia 12/01/2014   Cerebellar stroke ([facility]) 12/02/2014   Subarachnoid hemorrhage ([facility]) - L frontal 12/25/2015   ICH (intracerebral hemorrhage) ([facility]) - R occipital 12/27/2015   Atrial fibrillation with RVR ([facility]) 12/27/2015   Hyperlipidemia 12/27/2015   Cognitive deficits 12/27/2015   Hypothyroidism 12/27/2015   Anxiety 12/27/2015   Hemorrhagic stroke ([facility]) 01/29/2016   SAH (subarachnoid hemorrhage) ([facility]) 01/29/2016   Memory loss 01/29/2016   Paroxysmal atrial fibrillation ([facility]) 01/29/2016   Cerebral amyloid angiopathy ([facility]) 01/29/2016   Genetic testing 03/06/2016   OSA on CPAP 03/24/2019   Resolved Ambulatory Problems    Diagnosis Date Noted   No Resolved Ambulatory Problems   Past Medical History:  Diagnosis Date   Breast cancer ([facility])    GERD (gastroesophageal reflux disease)    Hashimoto's thyroiditis    Headache(784.0)    Hypercholesteremia    Spina bifida ([facility])    Spondylarthritis    Stroke ([facility]) 11/2015   Varicose vein     SOCIAL HX:  Social History   Tobacco Use   Smoking status: Never   Smokeless tobacco: Never  Substance Use Topics   Alcohol use: Yes    Alcohol/week: 0.0 standard drinks of alcohol    Comment: Rare     FAMILY HX:  Family History  Problem Relation Age of Onset   Breast cancer Mother 47   Lung cancer Father    Cancer Father    Heart attack Father    Breast cancer Sister 42       Bilateral breast cancer with 2nd dx at 67   Breast cancer Maternal Aunt 52   Breast cancer Sister 65   Lung cancer Maternal Uncle    Leukemia Paternal Uncle    Melanoma Brother 51       on ear   Prostate cancer Brother 49   Cancer Cousin        maternal cousin with cancer - NOS   Hyperthyroidism Daughter       ALLERGIES:  Allergies  Allergen Reactions   Buprenex [Buprenorphine] Nausea And Vomiting and Other (See Comments)    Oversedation   Simvastatin Other (See Comments)    Myalgias    Codeine Hives, Itching and  Rash      PERTINENT MEDICATIONS:  Outpatient Encounter Medications as of 07/21/2022  Medication Sig   acetaminophen (TYLENOL) 500 MG tablet Take 500 mg by mouth every 6 (six) hours as needed for headache (pain).   atorvastatin (LIPITOR) 20 MG tablet Take 20 mg by mouth at bedtime.    Biotin 5000 MCG TABS Take 5,000 mcg by mouth every morning.    CALCIUM PO Take 1 tablet by mouth every morning.   cholecalciferol 5000 UNITS TABS Take 1 tablet (5,000 Units total) by mouth daily. (Patient taking differently: Take 5,000 Units by mouth every morning. Vitamin D3)   DULoxetine (CYMBALTA) 30 MG capsule Take 30 mg by mouth every morning. Take with a 60 mg capsule for a total dose of 90 mg daily   DULoxetine (CYMBALTA) 60 MG capsule Take 60 mg by mouth every morning. Take with a 30 mg capsule for a total dose of 90 mg daily   fexofenadine (ALLEGRA) 180 MG tablet Take 1 tablet (180 mg total) by mouth as needed.   Fexofenadine HCl (ALLEGRA PO) Take 1 tablet by mouth daily as needed (allergies).   fluticasone (FLONASE) 50 MCG/ACT nasal spray Place 1 spray into both nostrils daily as needed (allergies).  HYDROcodone-acetaminophen (NORCO/VICODIN) 5-325 MG tablet Take 1 tablet by mouth every 4 (four) hours as needed.   levothyroxine (SYNTHROID, LEVOTHROID) 50 MCG tablet Take 50 mcg by mouth daily before breakfast.    lisinopril (PRINIVIL,ZESTRIL) 10 MG tablet Take 10 mg by mouth daily before breakfast.    LORazepam (ATIVAN) 0.5 MG tablet Take 0.5 mg by mouth 2 (two) times daily.    Propylene Glycol (SYSTANE BALANCE) 0.6 % SOLN Place 1 drop into both eyes daily as needed (dry eyes).    No facility-administered encounter medications on file as of 07/21/2022.   I spent 40 minutes providing this consultation; this includes time spent with patient/family, chart review and documentation. More than 50% of the time in this consultation was spent on counseling and coordinating communication   Thank you for the  opportunity to participate in the care of Ms. Rahm.  The palliative care team will continue to follow. Please call our office at [phone] if we can be of additional assistance.   Note: Portions of this note were generated with Lobbyist. Dictation errors may occur despite best attempts at proofreading.  Teodoro Spray, NP

## 2022-08-11 ENCOUNTER — Telehealth: Payer: Self-pay | Admitting: Diagnostic Neuroimaging

## 2022-08-11 NOTE — Telephone Encounter (Signed)
Husband is asking for a letter to be excused from [employer] as a result of pt's diagnoses and why she would not be able to serve.  Husband is asking if this can be done within the week to ensure he submits it back to the courts in time. Please call when ready, please leave a message if he does not pick up.

## 2022-08-11 NOTE — Telephone Encounter (Signed)
Contacted pt spouse back, he was unaware of the court date. I will ask for pt to be permanently excused fro, jury duty. Letter is at front desk for pick up.

## 2022-08-11 NOTE — Telephone Encounter (Signed)
Lisa, Pt hasn't been seen in almost 2 yrs. Please get pt rescheduled with Amy as last visit was cancelled   POD 1/Amy are you willing to provided letter for pt to excuse for jury duty or does pt need to be seen first? Dr Mamie Nick is out of office

## 2022-08-20 DIAGNOSIS — F329 Major depressive disorder, single episode, unspecified: Secondary | ICD-10-CM | POA: Diagnosis not present

## 2022-08-20 DIAGNOSIS — I480 Paroxysmal atrial fibrillation: Secondary | ICD-10-CM | POA: Diagnosis not present

## 2022-08-20 DIAGNOSIS — H6121 Impacted cerumen, right ear: Secondary | ICD-10-CM | POA: Diagnosis not present

## 2022-08-20 DIAGNOSIS — I619 Nontraumatic intracerebral hemorrhage, unspecified: Secondary | ICD-10-CM | POA: Diagnosis not present

## 2022-08-20 DIAGNOSIS — E7800 Pure hypercholesterolemia, unspecified: Secondary | ICD-10-CM | POA: Diagnosis not present

## 2022-08-20 DIAGNOSIS — E039 Hypothyroidism, unspecified: Secondary | ICD-10-CM | POA: Diagnosis not present

## 2022-08-20 DIAGNOSIS — F411 Generalized anxiety disorder: Secondary | ICD-10-CM | POA: Diagnosis not present

## 2022-08-20 DIAGNOSIS — Z0000 Encounter for general adult medical examination without abnormal findings: Secondary | ICD-10-CM | POA: Diagnosis not present

## 2022-08-20 DIAGNOSIS — I10 Essential (primary) hypertension: Secondary | ICD-10-CM | POA: Diagnosis not present

## 2022-08-20 DIAGNOSIS — K219 Gastro-esophageal reflux disease without esophagitis: Secondary | ICD-10-CM | POA: Diagnosis not present

## 2022-08-20 DIAGNOSIS — Z23 Encounter for immunization: Secondary | ICD-10-CM | POA: Diagnosis not present

## 2022-08-20 DIAGNOSIS — Z66 Do not resuscitate: Secondary | ICD-10-CM | POA: Diagnosis not present

## 2022-10-21 ENCOUNTER — Other Ambulatory Visit: Payer: PPO

## 2022-10-21 DIAGNOSIS — Z515 Encounter for palliative care: Secondary | ICD-10-CM

## 2022-10-22 ENCOUNTER — Telehealth: Payer: Self-pay | Admitting: Diagnostic Neuroimaging

## 2022-10-22 NOTE — Telephone Encounter (Signed)
Pt husband is calling. Stated he needs some help with pt. Stated pt is sun downing. He asked is Amy could give him a call. Stated he needs Help again.

## 2022-10-23 ENCOUNTER — Telehealth: Payer: Self-pay | Admitting: Diagnostic Neuroimaging

## 2022-10-23 NOTE — Telephone Encounter (Signed)
Pt husband returned a call. Scheduled appointment with Dr. Leta Baptist on 1/2 @ 4pm.

## 2022-10-23 NOTE — Telephone Encounter (Signed)
LVM asking pt to call the office to schedule an appt.

## 2022-10-23 NOTE — Telephone Encounter (Signed)
noted 

## 2022-10-29 ENCOUNTER — Emergency Department (HOSPITAL_COMMUNITY): Payer: PPO

## 2022-10-29 ENCOUNTER — Encounter (HOSPITAL_COMMUNITY): Payer: Self-pay

## 2022-10-29 ENCOUNTER — Observation Stay (HOSPITAL_COMMUNITY)
Admission: EM | Admit: 2022-10-29 | Discharge: 2022-10-30 | Disposition: A | Discharge status: Home / Self Care | Payer: PPO | Attending: Emergency Medicine | Admitting: Emergency Medicine

## 2022-10-29 ENCOUNTER — Other Ambulatory Visit: Payer: Self-pay

## 2022-10-29 DIAGNOSIS — F0390 Unspecified dementia without behavioral disturbance: Secondary | ICD-10-CM | POA: Insufficient documentation

## 2022-10-29 DIAGNOSIS — R4701 Aphasia: Secondary | ICD-10-CM | POA: Diagnosis not present

## 2022-10-29 DIAGNOSIS — R41841 Cognitive communication deficit: Secondary | ICD-10-CM | POA: Diagnosis not present

## 2022-10-29 DIAGNOSIS — U071 COVID-19: Secondary | ICD-10-CM | POA: Diagnosis present

## 2022-10-29 DIAGNOSIS — G4733 Obstructive sleep apnea (adult) (pediatric): Secondary | ICD-10-CM | POA: Diagnosis not present

## 2022-10-29 DIAGNOSIS — I6782 Cerebral ischemia: Secondary | ICD-10-CM | POA: Diagnosis not present

## 2022-10-29 DIAGNOSIS — Z853 Personal history of malignant neoplasm of breast: Secondary | ICD-10-CM | POA: Insufficient documentation

## 2022-10-29 DIAGNOSIS — A419 Sepsis, unspecified organism: Secondary | ICD-10-CM | POA: Diagnosis not present

## 2022-10-29 DIAGNOSIS — G9340 Encephalopathy, unspecified: Secondary | ICD-10-CM | POA: Diagnosis not present

## 2022-10-29 DIAGNOSIS — E871 Hypo-osmolality and hyponatremia: Secondary | ICD-10-CM | POA: Insufficient documentation

## 2022-10-29 DIAGNOSIS — Z66 Do not resuscitate: Secondary | ICD-10-CM | POA: Insufficient documentation

## 2022-10-29 DIAGNOSIS — R2689 Other abnormalities of gait and mobility: Secondary | ICD-10-CM | POA: Diagnosis not present

## 2022-10-29 DIAGNOSIS — E039 Hypothyroidism, unspecified: Secondary | ICD-10-CM | POA: Diagnosis not present

## 2022-10-29 DIAGNOSIS — Z79899 Other long term (current) drug therapy: Secondary | ICD-10-CM | POA: Insufficient documentation

## 2022-10-29 DIAGNOSIS — E785 Hyperlipidemia, unspecified: Secondary | ICD-10-CM | POA: Diagnosis present

## 2022-10-29 DIAGNOSIS — G319 Degenerative disease of nervous system, unspecified: Secondary | ICD-10-CM | POA: Diagnosis not present

## 2022-10-29 DIAGNOSIS — I480 Paroxysmal atrial fibrillation: Secondary | ICD-10-CM | POA: Diagnosis not present

## 2022-10-29 DIAGNOSIS — G459 Transient cerebral ischemic attack, unspecified: Secondary | ICD-10-CM | POA: Diagnosis present

## 2022-10-29 DIAGNOSIS — Z8673 Personal history of transient ischemic attack (TIA), and cerebral infarction without residual deficits: Secondary | ICD-10-CM

## 2022-10-29 DIAGNOSIS — R531 Weakness: Secondary | ICD-10-CM | POA: Diagnosis not present

## 2022-10-29 DIAGNOSIS — R29818 Other symptoms and signs involving the nervous system: Secondary | ICD-10-CM | POA: Diagnosis not present

## 2022-10-29 LAB — URINALYSIS, ROUTINE W REFLEX MICROSCOPIC
Bacteria, UA: NONE SEEN
Bilirubin Urine: NEGATIVE
Glucose, UA: NEGATIVE mg/dL
Hgb urine dipstick: NEGATIVE
Ketones, ur: 5 mg/dL — AB
Leukocytes,Ua: NEGATIVE
Nitrite: NEGATIVE
Protein, ur: NEGATIVE mg/dL
Specific Gravity, Urine: >1.046 — ABNORMAL HIGH (ref 1.005–1.030)
pH: 8 (ref 5.0–8.0)

## 2022-10-29 LAB — BASIC METABOLIC PANEL
Anion gap: 11 (ref 5–15)
BUN: 17 mg/dL (ref 8–23)
CO2: 20 mmol/L — ABNORMAL LOW (ref 22–32)
Calcium: 8.8 mg/dL — ABNORMAL LOW (ref 8.9–10.3)
Chloride: 103 mmol/L (ref 98–111)
Creatinine, Ser: 1.13 mg/dL — ABNORMAL HIGH (ref 0.44–1.00)
GFR, Estimated: 52 mL/min — ABNORMAL LOW (ref >60)
Glucose, Bld: 182 mg/dL — ABNORMAL HIGH (ref 70–99)
Potassium: 3.8 mmol/L (ref 3.5–5.1)
Sodium: 134 mmol/L — ABNORMAL LOW (ref 135–145)

## 2022-10-29 LAB — DIFFERENTIAL
Abs Immature Granulocytes: 0.01 10*3/uL (ref 0.00–0.07)
Basophils Absolute: 0 10*3/uL (ref 0.0–0.1)
Basophils Relative: 1 %
Eosinophils Absolute: 0 10*3/uL (ref 0.0–0.5)
Eosinophils Relative: 1 %
Immature Granulocytes: 0 %
Lymphocytes Relative: 11 %
Lymphs Abs: 0.6 10*3/uL — ABNORMAL LOW (ref 0.7–4.0)
Monocytes Absolute: 1 10*3/uL (ref 0.1–1.0)
Monocytes Relative: 19 %
Neutro Abs: 3.8 10*3/uL (ref 1.7–7.7)
Neutrophils Relative %: 68 %

## 2022-10-29 LAB — RAPID URINE DRUG SCREEN, HOSP PERFORMED
Amphetamines: NOT DETECTED
Barbiturates: NOT DETECTED
Benzodiazepines: NOT DETECTED
Cocaine: NOT DETECTED
Opiates: NOT DETECTED
Tetrahydrocannabinol: NOT DETECTED

## 2022-10-29 LAB — CBC
HCT: 34.2 % — ABNORMAL LOW (ref 36.0–46.0)
Hemoglobin: 12 g/dL (ref 12.0–15.0)
MCH: 32.5 pg (ref 26.0–34.0)
MCHC: 35.1 g/dL (ref 30.0–36.0)
MCV: 92.7 fL (ref 80.0–100.0)
Platelets: 178 10*3/uL (ref 150–400)
RBC: 3.69 MIL/uL — ABNORMAL LOW (ref 3.87–5.11)
RDW: 12.4 % (ref 11.5–15.5)
WBC: 5.5 10*3/uL (ref 4.0–10.5)
nRBC: 0 % (ref 0.0–0.2)

## 2022-10-29 LAB — COMPREHENSIVE METABOLIC PANEL
ALT: 20 U/L (ref 0–44)
AST: 25 U/L (ref 15–41)
Albumin: 3.2 g/dL — ABNORMAL LOW (ref 3.5–5.0)
Alkaline Phosphatase: 59 U/L (ref 38–126)
Anion gap: 9 (ref 5–15)
BUN: 22 mg/dL (ref 8–23)
CO2: 18 mmol/L — ABNORMAL LOW (ref 22–32)
Calcium: 8.4 mg/dL — ABNORMAL LOW (ref 8.9–10.3)
Chloride: 101 mmol/L (ref 98–111)
Creatinine, Ser: 1.09 mg/dL — ABNORMAL HIGH (ref 0.44–1.00)
GFR, Estimated: 54 mL/min — ABNORMAL LOW (ref >60)
Glucose, Bld: 95 mg/dL (ref 70–99)
Potassium: 4.3 mmol/L (ref 3.5–5.1)
Sodium: 128 mmol/L — ABNORMAL LOW (ref 135–145)
Total Bilirubin: 0.6 mg/dL (ref 0.3–1.2)
Total Protein: 5.7 g/dL — ABNORMAL LOW (ref 6.5–8.1)

## 2022-10-29 LAB — I-STAT CHEM 8, ED
BUN: 23 mg/dL (ref 8–23)
Calcium, Ion: 1.03 mmol/L — ABNORMAL LOW (ref 1.15–1.40)
Chloride: 102 mmol/L (ref 98–111)
Creatinine, Ser: 1 mg/dL (ref 0.44–1.00)
Glucose, Bld: 93 mg/dL (ref 70–99)
HCT: 33 % — ABNORMAL LOW (ref 36.0–46.0)
Hemoglobin: 11.2 g/dL — ABNORMAL LOW (ref 12.0–15.0)
Potassium: 4.5 mmol/L (ref 3.5–5.1)
Sodium: 130 mmol/L — ABNORMAL LOW (ref 135–145)
TCO2: 19 mmol/L — ABNORMAL LOW (ref 22–32)

## 2022-10-29 LAB — RESP PANEL BY RT-PCR (FLU A&B, COVID) ARPGX2
Influenza A by PCR: NEGATIVE
Influenza B by PCR: NEGATIVE
SARS Coronavirus 2 by RT PCR: POSITIVE — AB

## 2022-10-29 LAB — LACTIC ACID, PLASMA: Lactic Acid, Venous: 1.3 mmol/L (ref 0.5–1.9)

## 2022-10-29 LAB — PROTIME-INR
INR: 1.1 (ref 0.8–1.2)
Prothrombin Time: 14.4 seconds (ref 11.4–15.2)

## 2022-10-29 LAB — APTT: aPTT: 25 seconds (ref 24–36)

## 2022-10-29 LAB — CBG MONITORING, ED: Glucose-Capillary: 85 mg/dL (ref 70–99)

## 2022-10-29 LAB — ETHANOL: Alcohol, Ethyl (B): <10 mg/dL (ref <10)

## 2022-10-29 MED ORDER — ACETAMINOPHEN 650 MG RE SUPP: 650.0000 mg | RECTAL | Status: DC | PRN

## 2022-10-29 MED ORDER — ACETAMINOPHEN 325 MG PO TABS
650.0000 mg | ORAL_TABLET | Freq: Once | ORAL | Status: DC
  Filled 2022-10-29: qty 2

## 2022-10-29 MED ORDER — ACETAMINOPHEN 650 MG RE SUPP
RECTAL | Status: AC
  Administered 2022-10-29: 650 mg via RECTAL
  Filled 2022-10-29: qty 1

## 2022-10-29 MED ORDER — STROKE: EARLY STAGES OF RECOVERY BOOK
Freq: Once | Status: AC
  Filled 2022-10-29: qty 1

## 2022-10-29 MED ORDER — LEVOTHYROXINE SODIUM 50 MCG PO TABS
50.0000 ug | ORAL_TABLET | Freq: Every day | ORAL | Status: DC
  Administered 2022-10-30: 50 ug via ORAL
  Filled 2022-10-29: qty 1

## 2022-10-29 MED ORDER — IOHEXOL 350 MG/ML SOLN
150.0000 mL | Freq: Once | INTRAVENOUS | Status: AC | PRN
  Administered 2022-10-29: 150 mL via INTRAVENOUS

## 2022-10-29 MED ORDER — ACETAMINOPHEN 160 MG/5ML PO SOLN: 650.0000 mg | ORAL | Status: DC | PRN

## 2022-10-29 MED ORDER — SENNOSIDES-DOCUSATE SODIUM 8.6-50 MG PO TABS: 1.0000 | ORAL_TABLET | Freq: Every evening | ORAL | Status: DC | PRN

## 2022-10-29 MED ORDER — LORAZEPAM 0.5 MG PO TABS
0.5000 mg | ORAL_TABLET | Freq: Two times a day (BID) | ORAL | Status: DC | PRN
  Administered 2022-10-30: 0.5 mg via ORAL
  Filled 2022-10-29: qty 1

## 2022-10-29 MED ORDER — NIRMATRELVIR/RITONAVIR (PAXLOVID) TABLET (RENAL DOSING)
2.0000 | ORAL_TABLET | Freq: Two times a day (BID) | ORAL | Status: DC
  Administered 2022-10-29 – 2022-10-30 (×3): 2 via ORAL
  Filled 2022-10-29: qty 20

## 2022-10-29 MED ORDER — SODIUM CHLORIDE 0.9 % IV SOLN
1.0000 g | Freq: Once | INTRAVENOUS | Status: AC
  Administered 2022-10-29: 1 g via INTRAVENOUS
  Filled 2022-10-29: qty 10

## 2022-10-29 MED ORDER — LORAZEPAM 2 MG/ML IJ SOLN
INTRAMUSCULAR | Status: AC
  Administered 2022-10-29: 1 mg
  Filled 2022-10-29: qty 1

## 2022-10-29 MED ORDER — QUETIAPINE FUMARATE 25 MG PO TABS
25.0000 mg | ORAL_TABLET | Freq: Every day | ORAL | Status: DC
  Administered 2022-10-29: 25 mg via ORAL
  Filled 2022-10-29: qty 1

## 2022-10-29 MED ORDER — ATORVASTATIN CALCIUM 10 MG PO TABS: 20.0000 mg | ORAL_TABLET | Freq: Every day | ORAL | Status: DC

## 2022-10-29 MED ORDER — ACETAMINOPHEN 650 MG RE SUPP: 650.0000 mg | Freq: Once | RECTAL | Status: AC

## 2022-10-29 MED ORDER — LACTATED RINGERS IV BOLUS (SEPSIS)
500.0000 mL | Freq: Once | INTRAVENOUS | Status: AC
  Administered 2022-10-29: 500 mL via INTRAVENOUS

## 2022-10-29 MED ORDER — ACETAMINOPHEN 325 MG PO TABS: 650.0000 mg | ORAL_TABLET | ORAL | Status: DC | PRN

## 2022-10-29 MED ORDER — DULOXETINE HCL 60 MG PO CPEP
90.0000 mg | ORAL_CAPSULE | Freq: Every morning | ORAL | Status: DC
  Administered 2022-10-30: 90 mg via ORAL
  Filled 2022-10-29: qty 1

## 2022-10-29 MED ORDER — SODIUM CHLORIDE 0.9 % IV SOLN: INTRAVENOUS | Status: DC

## 2022-10-29 MED ORDER — PROPYLENE GLYCOL 0.6 % OP SOLN: 1.0000 [drp] | Freq: Every day | OPHTHALMIC | Status: DC | PRN

## 2022-10-29 MED ORDER — LACTATED RINGERS IV SOLN: INTRAVENOUS | Status: DC

## 2022-10-29 MED ORDER — LISINOPRIL 10 MG PO TABS
10.0000 mg | ORAL_TABLET | Freq: Every day | ORAL | Status: DC
  Administered 2022-10-30: 10 mg via ORAL
  Filled 2022-10-29: qty 1

## 2022-10-29 NOTE — ED Notes (Addendum)
Pt son at bedside, pt a little agitated, pulling at cords, etc. Pt states she needs to use restroom. Pt assisted to bedside commode to have BM.

## 2022-10-29 NOTE — ED Provider Notes (Signed)
[facility] DEPART[facility] Provider Note   CSN: [id number] Arrival date & time: 10/29/22  1112     History  Chief complaint trouble walking and leaning to the left  Barbara [name] is a 71 y.o. female.  HPI   Patient has a history of prior cerebral hemorrhage, stroke, amyloid angiopathy and dementia.  At baseline patient is not able to talk much anymore but is usually able to walk around on her own.  She does use a diaper.  Normally is incontinent of stool.  Husband provides 24-hour careAnd they have a caregiver to assist when he is out.  Morning patient was incontinent of urine.  She is having difficulty walking.  She is unsteady.  This is new for her started around 0 800.  Patient was activated as a code stroke.  Home Medications Prior to Admission medications   Medication Sig Start Date End Date Taking? Authorizing Provider  acetaminophen (TYLENOL) 500 MG tablet Take 500 mg by mouth every 6 (six) hours as needed for headache (pain).    [provider]  atorvastatin (LIPITOR) 20 MG tablet Take 20 mg by mouth at bedtime.     [provider]  Biotin 5000 MCG TABS Take 5,000 mcg by mouth every morning.     [provider]  CALCIUM PO Take 1 tablet by mouth every morning.    [provider]  cholecalciferol 5000 UNITS TABS Take 1 tablet (5,000 Units total) by mouth daily. Patient taking differently: Take 5,000 Units by mouth every morning. Vitamin D3 10/03/15   Magrinat, Virgie Dad, MD  DULoxetine (CYMBALTA) 30 MG capsule Take 30 mg by mouth every morning. Take with a 60 mg capsule for a total dose of 90 mg daily 10/30/19   [provider]  DULoxetine (CYMBALTA) 60 MG capsule Take 60 mg by mouth every morning. Take with a 30 mg capsule for a total dose of 90 mg daily 02/10/19   [provider]  fexofenadine (ALLEGRA) 180 MG tablet Take 1 tablet (180 mg total) by mouth as needed. 03/15/18   Magrinat, Virgie Dad, MD   Fexofenadine HCl (ALLEGRA PO) Take 1 tablet by mouth daily as needed (allergies).    [provider]  fluticasone (FLONASE) 50 MCG/ACT nasal spray Place 1 spray into both nostrils daily as needed (allergies).     [provider]  HYDROcodone-acetaminophen (NORCO/VICODIN) 5-325 MG tablet Take 1 tablet by mouth every 4 (four) hours as needed. 11/25/19   Dorie Rank, MD  levothyroxine (SYNTHROID, LEVOTHROID) 50 MCG tablet Take 50 mcg by mouth daily before breakfast.     Kelton Pillar, MD  lisinopril (PRINIVIL,ZESTRIL) 10 MG tablet Take 10 mg by mouth daily before breakfast.  02/16/19   [provider]  LORazepam (ATIVAN) 0.5 MG tablet Take 0.5 mg by mouth 2 (two) times daily.  03/02/19   [provider]  Propylene Glycol (SYSTANE BALANCE) 0.6 % SOLN Place 1 drop into both eyes daily as needed (dry eyes).     [provider]      Allergies    Buprenex [buprenorphine], Simvastatin, and Codeine    Review of Systems   Review of Systems  Physical Exam Updated Vital Signs BP (!) 145/85   Pulse (!) 105   Temp (!) 101.5 F (38.6 C) (Oral)   Resp (!) 21   Ht 1.676 m ('5\' 6"'$ )   Wt 68.2 kg   LMP 11/24/2004   SpO2 93%   BMI 24.27  kg/m  Physical Exam Vitals and nursing note reviewed.  Constitutional:      Appearance: She is well-developed. She is ill-appearing.  HENT:     Head: Normocephalic and atraumatic.     Right Ear: External ear normal.     Left Ear: External ear normal.  Eyes:     General: No scleral icterus.       Right eye: No discharge.        Left eye: No discharge.     Conjunctiva/sclera: Conjunctivae normal.  Neck:     Trachea: No tracheal deviation.  Cardiovascular:     Rate and Rhythm: Normal rate and regular rhythm.  Pulmonary:     Effort: Pulmonary effort is normal. No respiratory distress.     Breath sounds: Normal breath sounds. No stridor. No wheezing or rales.  Abdominal:     General: There is no distension.      Palpations: Abdomen is soft.     Tenderness: There is no abdominal tenderness. There is no guarding or rebound.  Musculoskeletal:        General: No tenderness or deformity.     Cervical back: Neck supple.  Skin:    General: Skin is warm and dry.     Findings: No rash.  Neurological:     General: No focal deficit present.     Cranial Nerves: No facial asymmetry.     Motor: Tremor present. No abnormal muscle tone or seizure activity.  Psychiatric:        Mood and Affect: Mood is anxious.     ED Results / Procedures / Treatments   Labs (all labs ordered are listed, but only abnormal results are displayed) Labs Reviewed  RESP PANEL BY RT-PCR (FLU A&B, COVID) ARPGX2 - Abnormal; Notable for the following components:      Result Value   SARS Coronavirus 2 by RT PCR POSITIVE (*)    All other components within normal limits  CBC - Abnormal; Notable for the following components:   RBC 3.69 (*)    HCT 34.2 (*)    All other components within normal limits  DIFFERENTIAL - Abnormal; Notable for the following components:   Lymphs Abs 0.6 (*)    All other components within normal limits  COMPREHENSIVE METABOLIC PANEL - Abnormal; Notable for the following components:   Sodium 128 (*)    CO2 18 (*)    Creatinine, Ser 1.09 (*)    Calcium 8.4 (*)    Total Protein 5.7 (*)    Albumin 3.2 (*)    GFR, Estimated 54 (*)    All other components within normal limits  URINALYSIS, ROUTINE W REFLEX MICROSCOPIC - Abnormal; Notable for the following components:   Specific Gravity, Urine >1.046 (*)    Ketones, ur 5 (*)    All other components within normal limits  I-STAT CHEM 8, ED - Abnormal; Notable for the following components:   Sodium 130 (*)    Calcium, Ion 1.03 (*)    TCO2 19 (*)    Hemoglobin 11.2 (*)    HCT 33.0 (*)    All other components within normal limits  CULTURE, BLOOD (ROUTINE X 2)  CULTURE, BLOOD (ROUTINE X 2)  URINE CULTURE  ETHANOL  PROTIME-INR  APTT  RAPID URINE DRUG  SCREEN, HOSP PERFORMED  LACTIC ACID, PLASMA  CBG MONITORING, ED    EKG None  Radiology MR BRAIN WO CONTRAST  Result Date: 10/29/2022 CLINICAL DATA:  Neuro deficit, acute, stroke suspected EXAM:  MRI HEAD WITHOUT CONTRAST TECHNIQUE: Multiplanar, multiecho pulse sequences of the brain and surrounding structures were obtained without intravenous contrast. COMPARISON:  CTA head/neck from the same day. FINDINGS: Brain: No acute infarction, acute hemorrhage, hydrocephalus, extra-axial collection or mass lesion. Multiple small foci of susceptibility artifact throughout bilateral cerebral hemispheres, compatible with prior microhemorrhages. 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 Cerebral atrophy. Left occipital encephalomalacia. Vascular: Major arterial flow voids are maintained skull base. Skull and upper cervical spine: Normal marrow signal. Sinuses/Orbits: Mild paranasal sinus mucosal thickening. No acute bowel findings. Other: No mastoid effusions. IMPRESSION: 1. No evidence of acute intracranial abnormality. 2. 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 Electronically Signed   By: Margaretha Sheffield M.D.   On: 10/29/2022 15:35   CT ANGIO HEAD NECK W WO CM (CODE STROKE)  Result Date: 10/29/2022 CLINICAL DATA:  Neuro deficit, acute, stroke suspected EXAM: CT ANGIOGRAPHY HEAD AND NECK TECHNIQUE: Multidetector CT imaging of the head and neck was performed using the standard protocol during bolus administration of intravenous contrast. Multiplanar CT image reconstructions and MIPs were obtained to evaluate the vascular anatomy. Carotid stenosis measurements (when applicable) are obtained utilizing NASCET criteria, using the distal internal carotid diameter as the denominator. RADIATION DOSE REDUCTION: This exam was performed according to the departmental dose-optimization program which includes automated exposure  control, adjustment of the mA and/or kV according to patient size and/or use of iterative reconstruction technique. CONTRAST:  181m OMNIPAQUE IOHEXOL 350 MG/ML SOLN COMPARISON:  None Available. FINDINGS: CT HEAD FINDINGS Brain: No evidence of acute infarction, hemorrhage, hydrocephalus, extra-axial collection or mass lesion/mass effect. Cerebral atrophy. Patchy white matter hypodensities, compatible with chronic microvascular ischemic disease. Vascular: Detailed below. Skull: No acute fracture. Sinuses/Orbits: No acute finding. Other: No mastoid effusions. Review of the MIP images confirms the above findings CTA NECK FINDINGS Aortic arch: Great vessel origins are patent without significant stenosis. Right carotid system: No evidence of dissection, stenosis (50% or greater), or occlusion. Left carotid system: No evidence of dissection, stenosis (50% or greater), or occlusion. Vertebral arteries: Left dominant. No evidence of dissection, stenosis (50% or greater), or occlusion. Skeleton: Moderate multilevel degenerative change. Other neck: Heterogeneous and enlarged thyroid, further characterized on prior ultrasound from October 13, 2008. Upper chest: Visualized lung apices are clear. Review of the MIP images confirms the above findings CTA HEAD FINDINGS Anterior circulation: Bilateral intracranial ICAs, MCAs none, and ACAs are patent without proximal hemodynamically significant stenosis. Posterior circulation: Bilateral intradural vertebral arteries, basilar artery and bilateral posterior cerebral arteries are patent without proximal hemodynamically significant stenosis. Venous sinuses: As permitted by contrast timing, patent. Review of the MIP images confirms the above findings IMPRESSION: No emergent large vessel occlusion or proximal hemodynamically stenosis. Electronically Signed   By: FMargaretha SheffieldM.D.   On: 10/29/2022 12:38   DG Chest Port 1 View  Result Date: 10/29/2022 CLINICAL DATA:  Sepsis. EXAM:  PORTABLE CHEST 1 VIEW COMPARISON:  12/31/2018 FINDINGS: The cardiac silhouette, mediastinal and hilar contours are normal. The lungs are clear. No infiltrates or effusions. No pulmonary lesions. The bony thorax is intact. IMPRESSION: No acute cardiopulmonary findings. Electronically Signed   By: PMarijo SanesM.D.   On: 10/29/2022 12:36   CT HEAD CODE STROKE WO CONTRAST  Result Date: 10/29/2022 CLINICAL DATA:  Code stroke.  No history provided. EXAM: CT HEAD WITHOUT CONTRAST TECHNIQUE: Contiguous axial images were obtained from the base of the skull through the vertex without  intravenous contrast. RADIATION DOSE REDUCTION: This exam was performed according to the departmental dose-optimization program which includes automated exposure control, adjustment of the mA and/or kV according to patient size and/or use of iterative reconstruction technique. COMPARISON:  MRI Brain 04/28/16. FINDINGS: Brain: No evidence of acute infarction, hemorrhage, hydrocephalus, extra-axial collection or mass lesion/mass effect. There is sequela of severe chronic microvascular ischemic change with advanced generalized volume loss that is slight left temporal lobe predominance. Vascular: No hyperdense vessel or unexpected calcification. Skull: Normal. Negative for fracture or focal lesion. Sinuses/Orbits: No acute finding. Polypoid mucosal thickening of the floor of the left maxillary sinus. Other: None ASPECTS ([place] Stroke Program Early CT Score) 10 IMPRESSION: No hemorrhage or CT evidence of an acute infarct. Findings were paged to Dr. Leonie Man on 10/29/22 at 11:39 AM via [employer] paging system Electronically Signed   By: Marin Roberts M.D.   On: 10/29/2022 11:40    Procedures Procedures    Medications Ordered in ED Medications  lactated ringers infusion ( Intravenous New Bag/Given 10/29/22 1319)  nirmatrelvir/ritonavir EUA (renal dosing) (PAXLOVID) 2 tablet (has no administration in time range)  atorvastatin (LIPITOR) tablet 20  mg (has no administration in time range)  DULoxetine (CYMBALTA) DR capsule 90 mg (has no administration in time range)  levothyroxine (SYNTHROID) tablet 50 mcg (has no administration in time range)  Propylene Glycol 0.6 % SOLN 1 drop (has no administration in time range)   stroke: early stages of recovery book (has no administration in time range)  acetaminophen (TYLENOL) tablet 650 mg (has no administration in time range)    Or  acetaminophen (TYLENOL) 160 MG/5ML solution 650 mg (has no administration in time range)    Or  acetaminophen (TYLENOL) suppository 650 mg (has no administration in time range)  senna-docusate (Senokot-S) tablet 1 tablet (has no administration in time range)  LORazepam (ATIVAN) 2 MG/ML injection (1 mg  Given 10/29/22 1140)  iohexol (OMNIPAQUE) 350 MG/ML injection 150 mL (150 mLs Intravenous Contrast Given 10/29/22 1154)  lactated ringers bolus 500 mL (0 mLs Intravenous Stopped 10/29/22 1319)  cefTRIAXone (ROCEPHIN) 1 g in sodium chloride 0.9 % 100 mL IVPB (0 g Intravenous Stopped 10/29/22 1319)  acetaminophen (TYLENOL) suppository 650 mg (650 mg Rectal Given 10/29/22 1418)    ED Course/ Medical Decision Making/ A&P Clinical Course as of 10/29/22 1538  Wed Oct 29, 2022  1206 Notified that patient has a temperature of 101.  Will administer IV fluids add lactic acid level and cultures [JK]  1306 Comprehensive metabolic panel(!) Sodium level decreased, new compared to previous values [JK]  1306 CBC(!) No leukocytosis [JK]  1514 Covid test positive [JK]  1521 Urinalysis, Routine w reflex microscopic Urine, Catheterized(!) Urinalysis not suggestive of infection [JK]  1521 CT scan without acute findings [JK]  1531 Paxlovid indication reviewed with pharmacy.  Will give her Paxlovid at the renal dosing.  Patient will hold her statin medication [JK]  1538 Case discussed with Dr Trilby Drummer regarding admission [JK]    Clinical Course User Index [JK] Dorie Rank, MD                            Medical Decision Making Problems Addressed: COVID-19 virus infection: acute illness or injury that poses a threat to life or bodily functions Hyponatremia: acute illness or injury Weakness: acute illness or injury that poses a threat to life or bodily functions  Amount and/or Complexity of Data Reviewed Labs: ordered. Decision-making  details documented in ED Course. Radiology: ordered and independent interpretation performed. Discussion of management or test interpretation with external provider(s): Patient reviewed with Dr. Leonie Man.  Patient not a candidate for tPA based on her prior cerebral hemorrhage.  Will plan on CT angio.  If no acute occlusion patient will require admission to hospital for further workup, possible stroke seizure  Risk OTC drugs. Prescription drug management. Decision regarding hospitalization.   Presented to ED for evaluation of weakness.  Initially felt to be a possible stroke.  Patient seen by stroke team on arrival.  CT and CT angio without acute findings.  Plan was for admission to the hospital for MRI and further stroke workup.  While in the ED the patient spiked a temperature up to 101.5.  Fortunately no signs of hypotension or lactic acidosis to suggest evolving sepsis.  Patient's urinalysis does not suggest infection.  Chest x-ray without pneumonia.  COVID test is positive.  Possible her symptoms are solely related to the covid but she does have an MRI that is pending.  I will consult with the hospitalist for admission and further treatment.        Final Clinical Impression(s) / ED Diagnoses Final diagnoses:  COVID-19 virus infection  Hyponatremia  Weakness    Rx / DC Orders ED Discharge Orders     None         Dorie Rank, MD 10/29/22 1538

## 2022-10-29 NOTE — ED Triage Notes (Signed)
Pt arrived POV from home c/o a possible stroke. Per husband pt has a hx of brain bleed and dementia since 2017. Pt woke up around 4am and was at baseline pt wanted to lay down at 830 this morning, husband went back to check on pt a little later and pt was agitated, confused and trying to get undressed. Pt is aphasic in triage.

## 2022-10-29 NOTE — Consult Note (Signed)
Neurology Consultation  Reason for Consult: Code stroke Referring Physician: Tomi Bamberger  CC: Aphasia, incontinence  History is obtained from:Patient, husband, daughter-in-law  HPI: Barbara [name] is a 71 y.o. 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 She was in her usual state of health at 0800 per her husband.  At baseline she has mild dementia and aphasia and some speech difficulties but is able to recognize family and have a brief conversation with short sentences.  He tells [place] that these were similar symptoms to when she had her first stroke in 2017.  At that time she had a subarachnoid hemorrhage and there was concern for CAA.  Family additionally tells [place] that at baseline she does have some aphasia and dementia and her memory has been decreasing progressively.  She does walk independently, but needs help with every day things such as using the bathroom and getting dressed.  She is incontinent of stool.  She does have a home health aide during the day.  She does have a DNR and she does see [facility] once a month. On examination after she arrived she was found to have aphasia and no usable speech and not following commands but had no focal motor deficits involving face or extremities.   LKW: 0800 tpa given?: no, hx of SAH and possible CAA Thrombectomy : no LVO Premorbid modified Rankin scale (mRS): 3-4  FUX:NATFTD to obtain due to altered mental status.   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  Narrowing of the 4th and 5th lumbar interspaces.    Stroke [facility]) 11/2015   Varicose  vein       Family History  Problem Relation Age of Onset   Breast cancer Mother 20   Lung cancer Father    Cancer Father    Heart attack Father    Breast cancer Sister 7       Bilateral breast cancer with 2nd dx at 18   Breast cancer Maternal Aunt 52   Breast cancer Sister 52   Lung cancer Maternal Uncle    Leukemia Paternal Uncle    Melanoma Brother 3       on ear   Prostate cancer Brother 88   Cancer Cousin        maternal cousin with cancer - NOS   Hyperthyroidism Daughter      Social History:   reports that she has never smoked. She has never used smokeless tobacco. She reports current alcohol use. She reports that she does not use drugs.  Medications No current facility-administered medications for this encounter.  Current Outpatient Medications:    acetaminophen (TYLENOL) 500 MG tablet, Take 500 mg by mouth every 6 (six) hours as needed for headache (pain)., Disp: , Rfl:    atorvastatin (LIPITOR) 20 MG tablet, Take 20 mg by mouth at bedtime. , Disp: , Rfl:    Biotin 5000 MCG TABS, Take 5,000 mcg by mouth every morning. , Disp: , Rfl:    CALCIUM PO, Take 1 tablet by  mouth every morning., Disp: , Rfl:    cholecalciferol 5000 UNITS TABS, Take 1 tablet (5,000 Units total) by mouth daily. (Patient taking differently: Take 5,000 Units by mouth every morning. Vitamin D3), Disp: 90 tablet, Rfl: 4   DULoxetine (CYMBALTA) 30 MG capsule, Take 30 mg by mouth every morning. Take with a 60 mg capsule for a total dose of 90 mg daily, Disp: , Rfl:    DULoxetine (CYMBALTA) 60 MG capsule, Take 60 mg by mouth every morning. Take with a 30 mg capsule for a total dose of 90 mg daily, Disp: , Rfl:    fexofenadine (ALLEGRA) 180 MG tablet, Take 1 tablet (180 mg total) by mouth as needed., Disp: 90 tablet, Rfl: 0   Fexofenadine HCl (ALLEGRA PO), Take 1 tablet by mouth daily as needed (allergies)., Disp: , Rfl:    fluticasone (FLONASE) 50 MCG/ACT nasal spray, Place 1 spray into both nostrils  daily as needed (allergies). , Disp: , Rfl:    HYDROcodone-acetaminophen (NORCO/VICODIN) 5-325 MG tablet, Take 1 tablet by mouth every 4 (four) hours as needed., Disp: 12 tablet, Rfl: 0   levothyroxine (SYNTHROID, LEVOTHROID) 50 MCG tablet, Take 50 mcg by mouth daily before breakfast. , Disp: , Rfl:    lisinopril (PRINIVIL,ZESTRIL) 10 MG tablet, Take 10 mg by mouth daily before breakfast. , Disp: , Rfl:    LORazepam (ATIVAN) 0.5 MG tablet, Take 0.5 mg by mouth 2 (two) times daily. , Disp: , Rfl:    Propylene Glycol (SYSTANE BALANCE) 0.6 % SOLN, Place 1 drop into both eyes daily as needed (dry eyes). , Disp: , Rfl:    Exam: Current vital signs: LMP 11/24/2004  Vital signs in last 24 hours:    GENERAL: Awake, alert in NAD.  Appears quite anxious HEENT: - Normocephalic and atraumatic, dry mm, no LN++, no Thyromegally LUNGS - Clear to auscultation bilaterally with no wheezes CV - S1S2 RRR, no m/r/g, equal pulses bilaterally. ABDOMEN - Soft, nontender, nondistended with normoactive BS Ext: warm, well perfused, intact peripheral pulses, no edema  NEURO:  Mental Status: awake, anxious, not following commands, speech is unintelligible not able to follow commands.  Not tracking Cranial Nerves: PERRL 3 mm/brisk. EOMI, blinks to threat bilaterally, no facial asymmetry, hearing intact, tongue/uvula/soft palate midline, head is midline. No evidence of tongue atrophy or fasciculations Motor:  Moves extremities antigravity, bilateral upper extremity drift Tone: is normal and bulk is normal Sensation-cholecyst pain in all extremities Coordination: Unable to complete, she is very shaky/shivering Gait- deferred  NIHSS components Score: Comment  1a Level of Conscious 0'[x]'$  1'[]'$  2'[]'$  3'[]'$         1b LOC Questions 0'[]'$  1'[]'$  2'[x]'$           1c LOC Commands 0'[]'$  1'[]'$  2'[x]'$           2 Best Gaze 0'[x]'$  1'[]'$  2'[]'$           3 Visual 0'[x]'$  1'[]'$  2'[]'$  3'[]'$         4 Facial Palsy 0'[x]'$  1'[]'$  2'[]'$  3'[]'$         5a Motor Arm - left 0'[]'$   1'[x]'$  2'[]'$  3'[]'$  4'[]'$  UN'[]'$     5b Motor Arm - Right 0'[]'$  1'[x]'$  2'[]'$  3'[]'$  4'[]'$  UN'[]'$     6a Motor Leg - Left 0'[x]'$  1'[]'$  2'[]'$  3'[]'$  4'[]'$  UN'[]'$     6b Motor Leg - Right 0'[x]'$  1'[]'$  2'[]'$  3'[]'$  4'[]'$  UN'[]'$     7 Limb Ataxia 0'[x]'$  1'[]'$  2'[]'$  3'[]'$  UN'[]'$       8 Sensory 0'[x]'$  1'[]'$  2'[]'$  UN'[]'$   9 Best Language 0'[]'$  1'[]'$  2'[x]'$  3'[]'$         10 Dysarthria 0'[]'$  1'[]'$  2'[x]'$  UN'[]'$         11 Extinct. and Inattention 0'[x]'$  1'[]'$  2'[]'$           TOTAL: 10        Labs I have reviewed labs in epic and the results pertinent to this consultation are:  CBC    Component Value Date/Time   WBC 10.5 11/25/2019 1203   RBC 4.50 11/25/2019 1203   HGB 14.1 11/25/2019 1203   HGB 13.8 05/24/2019 1228   HGB 14.3 03/18/2017 0942   HCT 42.3 11/25/2019 1203   HCT 41.6 03/18/2017 0942   PLT 311 11/25/2019 1203   PLT 276 05/24/2019 1228   PLT 255 03/18/2017 0942   MCV 94.0 11/25/2019 1203   MCV 89.6 03/18/2017 0942   MCH 31.3 11/25/2019 1203   MCHC 33.3 11/25/2019 1203   RDW 12.1 11/25/2019 1203   RDW 13.0 03/18/2017 0942   LYMPHSABS 2.4 05/24/2019 1228   LYMPHSABS 1.8 03/18/2017 0942   MONOABS 0.4 05/24/2019 1228   MONOABS 0.3 03/18/2017 0942   EOSABS 0.1 05/24/2019 1228   EOSABS 0.1 03/18/2017 0942   BASOSABS 0.1 05/24/2019 1228   BASOSABS 0.0 03/18/2017 0942    CMP     Component Value Date/Time   NA 138 11/25/2019 1203   NA 138 03/18/2017 0942   K 4.0 11/25/2019 1203   K 4.2 03/18/2017 0942   CL 104 11/25/2019 1203   CO2 23 11/25/2019 1203   CO2 23 03/18/2017 0942   GLUCOSE 127 (H) 11/25/2019 1203   GLUCOSE 97 03/18/2017 0942   BUN 22 11/25/2019 1203   BUN 21.2 03/18/2017 0942   CREATININE 0.95 11/25/2019 1203   CREATININE 0.98 05/24/2019 1228   CREATININE 0.9 03/18/2017 0942   CALCIUM 9.8 11/25/2019 1203   CALCIUM 9.8 03/18/2017 0942   PROT 7.3 11/25/2019 1203   PROT 7.2 03/18/2017 0942   PROT 7.5 03/18/2017 0942   ALBUMIN 4.2 11/25/2019 1203   ALBUMIN 4.2 03/18/2017 0942   AST 19 11/25/2019 1203   AST 13 (L) 05/24/2019 1228   AST  13 03/18/2017 0942   ALT 23 11/25/2019 1203   ALT 17 05/24/2019 1228   ALT 11 03/18/2017 0942   ALKPHOS 62 11/25/2019 1203   ALKPHOS 71 03/18/2017 0942   BILITOT 0.9 11/25/2019 1203   BILITOT 0.4 05/24/2019 1228   BILITOT 0.61 03/18/2017 0942   GFRNONAA >60 11/25/2019 1203   GFRNONAA 59 (L) 05/24/2019 1228   GFRAA >60 11/25/2019 1203   GFRAA >60 05/24/2019 1228    Lipid Panel     Component Value Date/Time   CHOL 169 12/26/2015 0804   TRIG 142 12/26/2015 0804   HDL 40 (L) 12/26/2015 0804   CHOLHDL 4.2 12/26/2015 0804   VLDL 28 12/26/2015 0804   LDLCALC 101 (H) 12/26/2015 0804     Imaging I have reviewed the images obtained:  CT-head-no hemorrhage or CT evidence of acute infarct CTA head and neck- No LVO  CXR-no acute cardiopulmonary process  Assessment:  71 y.o. female with a past medical history of dementia, possible CAA, and ICH in 2017 found at 14 by her husband altered and incontinent. She was in her usual state of health at 0800 per her husband.  On exam she appears very anxious, aphasic, not currently following commands.  She did receive 1 mg of Ativan during CT.  Recommended admission to the hospitalist  service to complete stroke, seizure, and infectious workup.  MRI and EEG are pending.  She is a patient of [facility] and will be able to follow-up with them outpatient.  Impression: Ddx include: Stroke, seizure, acute infectious process causing encephalopathy  Recommendations: - Admit to medical service for stroke, seizure, and infectious workup - EEG pending -UA pending, chest x-ray - HgbA1c, fasting lipid panel - MRI of the brain without contrast - Frequent neuro checks - Echocardiogram - Risk factor modification - Telemetry monitoring - PT consult, OT consult, Speech consult - Stroke team to follow if MRI is positive for stroke    Patient seen and examined by NP/APP with MD. MD to update note as needed.   Janine Ores, DNP, FNP-BC [facility] Pager: [phone]  STROKE MD NOTE :  I have personally obtained history,examined this patient, reviewed notes, independently viewed imaging studies, participated in medical decision making and plan of care.ROS completed by me personally and pertinent positives fully documented  I have made any additions or clarifications directly to the above note. Agree with note above.  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  CT and CT angiogram are unremarkable.  Possibilities include seizure with postictal confusion versus small infarct or worsening of baseline neurological deficits in the setting of an acute illness.  Recommend admit to the medical hospitalist service with close neurological observation for seizures worsening stroke symptoms and work-up for infectious and reversible causes for checking UA, chest x-ray, blood chemistries, EEG and MRI scan.  Long discussion on the patient's husband at the bedside and with daughter-in-law who is a Designer, jewellery over the phone and answered questions.  Patient is DNR but family wants appropriate medical care.  Discussed with Dr. Marye Round, ER, MD.This patient is critically ill and at significant risk of neurological worsening, death and care requires constant monitoring of vital signs, hemodynamics,respiratory and cardiac monitoring, extensive review of multiple databases, frequent neurological assessment, discussion with family, other specialists and medical decision making of high complexity.I have made any additions or clarifications directly to the above note.This critical care time does not reflect procedure time, or teaching time or supervisory time of PA/NP/Med Resident etc but could involve care discussion time.  I spent 35 minutes of  neurocritical care time  in the care of  this patient.      Antony Contras, MD Medical Director [facility] Pager: [phone] 10/29/2022 2:36 PM

## 2022-10-29 NOTE — Progress Notes (Signed)
Spoke to patient and son at bedside about wearing CPAP.  Patient states she doesn't wear CPAP at home. Patient has been experiencing some agitation so the decision to hold off on wearing the CPAP was made at bedside with patient, son and RN.  RT will continue to monitor.

## 2022-10-29 NOTE — ED Provider Triage Note (Signed)
Emergency Medicine Provider Triage Evaluation Note  Barbara [name] , a 71 y.o. female  was evaluated in triage.  Pt complains of trouble walking and talking. History given by husband. Reports that she has had a hemorrhagic stroke before in the past. Husband reports that she has been leaning to the left side and has trouble walking which is new. She usually does not talk much. Husband reports that she usually follows commands. The patient is not following commands now, but is awake and maintaining eye contact. LKW 0830.  Review of Systems  Positive:  Negative:   Physical Exam  LMP 11/24/2004  Gen:   Awake, no distress   Resp:  Normal effort  MSK:   Moves extremities without difficulty  Other:  Leaning the to the left. She is unable to follow simple commands. Unable to perform exam given that she can not follow instructions.   Medical Decision Making  Medically screening exam initiated at 11:18 AM.  Appropriate orders placed.  Anjannette A Green was informed that the remainder of the evaluation will be completed by another provider, this initial triage assessment does not replace that evaluation, and the importance of remaining in the ED until their evaluation is complete.  CODE STROKE activated. Given her symptoms, previous strokes, and reported symptoms, code stroke activated.    Sherrell Puller, PA-C 10/29/22 1122

## 2022-10-29 NOTE — H&P (Signed)
History and Physical   Barbara [name] IWP:[id number] DOB: 11-17-1951 DOA: 10/29/2022  PCP: Kelton Pillar, MD   Patient coming from: Home  Chief Complaint: AMS  HPI: Barbara [name] is a 71 y.o. female with medical history significant of dementia, hemorrhagic stroke, atrial fibrillation, OSA on CPAP, breast cancer, hypothyroidism, hyperlipidemia, anxiety presenting with acute encephalopathy/focal neurologic deficits.  Patient was last known normal around 8:30 AM this morning.  Husband went to check on her later and noticed that she was unable to speak and had been incontinent as well as having some balance issues.  She has had some intermittent incontinence at baseline as well as some baseline mild dementia with mild aphasia.  However at baseline, she is able to converse in short sentences.  Concern for stroke by family as her symptoms are similar to her 2017 hemorrhagic stroke.  Unable to obtain full review of systems due to dementia/aphasia/altered mental status.  Family reports some congestion for the past 2 to 3 days but otherwise have not noted any preceding symptoms.   ED Course: Vital signs in the ED significant for fever to one 1.5, heart rate in the 80s to 100s, blood pressure in the 505L to 976 systolic, respiratory rate in the teens to 20s.  Lab workup included CMP with sodium 128, bicarb 18, creatinine stable 1.09, calcium 8.4, protein 5.7, albumin 3.2.  CBC within normal limits.  PT, PTT, INR within normal limits.  Lactic acid normal.  Respiratory panel positive for COVID.  Ethanol level negative.  UDS pending.  Urinalysis positive for ketones only.  Urine culture and blood culture pending.  Imaging studies include chest x-ray which showed no acute normality.  CT head which showed no acute abnormality.  CTA of the head and neck which showed no large vessel occlusion or hemodynamically significant proximal stenosis.  MR brain is pending.  Patient received Tylenol, ceftriaxone, 500 cc  bolus and started on a rate of 150 cc an hour.  Neurology was consulted and based on patient's exam and workup recommend stroke/TIA workup, seizure workup with EEG and infectious workup that may have led to encephalopathy.  The plan to follow along if MRI is positive for acute stroke.  Review of Systems: Unable to obtain full review of systems due to dementia/aphasia/altered mental status.  Past Medical History:  Diagnosis Date   Anxiety    Atrial fibrillation with RVR ([place]) 12/27/2015   Breast cancer ([place])    GERD (gastroesophageal reflux disease)    Hashimoto's thyroiditis    with goiter   Headache(784.0)    hx migraines   Hypercholesteremia    Spina bifida ([place])    occutta L5   Spondylarthritis    Low grade L5 on S1 and natural arches of L5 being opend bilaterally.  Narrowing of the 4th and 5th lumbar interspaces.    Stroke ([place]) 11/25/2015   Varicose vein     Past Surgical History:  Procedure Laterality Date   BREAST RECONSTRUCTION WITH PLACEMENT OF TISSUE EXPANDER AND FLEX HD (ACELLULAR HYDRATED DERMIS) Bilateral 10/13/2013   Procedure: PLACEMENT OF BILATERAL TISSUE EXPANDER FOR BREAST RECONSTRUCTION ;  Surgeon: Crissie Reese, MD;  Location: [place];  Service: Plastics;  Laterality: Bilateral;   CYSTOSCOPY WITH URETHRAL CARUNCLE     HERNIA REPAIR  1976   Left groin   MASTECTOMY W/ SENTINEL NODE BIOPSY Bilateral 10/13/2013   Procedure:  BILATERAL MASTECTOMY WITH RIGHT SENTINEL LYMPH NODE BIOPSY;  Surgeon: Merrie Roof, MD;  Location[facility]  OR;  Service: General;  Laterality: Bilateral;   PORT-A-CATH REMOVAL Left 12/21/2014   Procedure: MINOR REMOVAL OF PORT-A-CATH;  Surgeon: Autumn Messing III, MD;  Location: [place];  Service: General;  Laterality: Left;   PORTACATH PLACEMENT Left 10/13/2013   Procedure: INSERTION PORT-A-CATH;  Surgeon: Merrie Roof, MD;  Location: [place];  Service: General;  Laterality: Left;   [place] History  reports that she has never smoked. She has never used smokeless tobacco. She reports current alcohol use. She reports that she does not use drugs.  Allergies  Allergen Reactions   Buprenex [Buprenorphine] Nausea And Vomiting and Other (See Comments)    Oversedation   Simvastatin Other (See Comments)    Myalgias    Codeine Hives, Itching and Rash    Family History  Problem Relation Age of Onset   Breast cancer Mother 34   Lung cancer Father    Cancer Father    Heart attack Father    Breast cancer Sister 42       Bilateral breast cancer with 2nd dx at 20   Breast cancer Maternal Aunt 52   Breast cancer Sister 29   Lung cancer Maternal Uncle    Leukemia Paternal Uncle    Melanoma Brother 57       on ear   Prostate cancer Brother 35   Cancer Cousin        maternal cousin with cancer - NOS   Hyperthyroidism Daughter   Reviewed on admission  Prior to Admission medications   Medication Sig Start Date End Date Taking? Authorizing Provider  acetaminophen (TYLENOL) 500 MG tablet Take 500 mg by mouth every 6 (six) hours as needed for headache (pain).    [provider]  atorvastatin (LIPITOR) 20 MG tablet Take 20 mg by mouth at bedtime.     [provider]  Biotin 5000 MCG TABS Take 5,000 mcg by mouth every morning.     [provider]  CALCIUM PO Take 1 tablet by mouth every morning.    [provider]  cholecalciferol 5000 UNITS TABS Take 1 tablet (5,000 Units total) by mouth daily. Patient taking differently: Take 5,000 Units by mouth every morning. Vitamin D3 10/03/15   Magrinat, Virgie Dad, MD  DULoxetine (CYMBALTA) 30 MG capsule Take 30 mg by mouth every morning. Take with a 60 mg capsule for a total dose of 90 mg daily 10/30/19   [provider]  DULoxetine (CYMBALTA) 60 MG capsule Take 60 mg by mouth every morning. Take with a 30 mg capsule for a total dose of 90 mg daily 02/10/19   [provider]   fexofenadine (ALLEGRA) 180 MG tablet Take 1 tablet (180 mg total) by mouth as needed. 03/15/18   Magrinat, Virgie Dad, MD  Fexofenadine HCl (ALLEGRA PO) Take 1 tablet by mouth daily as needed (allergies).    [provider]  fluticasone (FLONASE) 50 MCG/ACT nasal spray Place 1 spray into both nostrils daily as needed (allergies).     [provider]  HYDROcodone-acetaminophen (NORCO/VICODIN) 5-325 MG tablet Take 1 tablet by mouth every 4 (four) hours as needed. 11/25/19   Dorie Rank, MD  levothyroxine (SYNTHROID, LEVOTHROID) 50 MCG tablet Take 50 mcg by mouth daily before breakfast.     Kelton Pillar, MD  lisinopril (PRINIVIL,ZESTRIL) 10 MG tablet Take 10 mg by mouth daily before breakfast.  02/16/19  [provider]  LORazepam (ATIVAN) 0.5 MG tablet Take 0.5 mg by mouth 2 (two) times daily.  03/02/19   [provider]  Propylene Glycol (SYSTANE BALANCE) 0.6 % SOLN Place 1 drop into both eyes daily as needed (dry eyes).     [provider]    Physical Exam: Vitals:   10/29/22 1300 10/29/22 1345 10/29/22 1415 10/29/22 1445  BP: (!) 142/78 128/75 (!) 151/77 (!) 145/85  Pulse: 94 88 (!) 110 (!) 105  Resp: 12 (!) 27 16 (!) 21  Temp:      TempSrc:      SpO2: 100% 100% 97% 93%  Weight:      Height:        Physical Exam Constitutional:      General: She is not in acute distress.    Appearance: Normal appearance.  HENT:     Head: Normocephalic and atraumatic.     Mouth/Throat:     Mouth: Mucous membranes are moist.     Pharynx: Oropharynx is clear.  Eyes:     Extraocular Movements: Extraocular movements intact.     Pupils: Pupils are equal, round, and reactive to light.  Cardiovascular:     Rate and Rhythm: Normal rate and regular rhythm.     Pulses: Normal pulses.     Heart sounds: Normal heart sounds.  Pulmonary:     Effort: Pulmonary effort is normal. No respiratory distress.     Breath sounds: Normal breath sounds.  Abdominal:      General: Bowel sounds are normal. There is no distension.     Palpations: Abdomen is soft.     Tenderness: There is no abdominal tenderness.  Musculoskeletal:        General: No swelling or deformity.  Skin:    General: Skin is warm and dry.  Neurological:     Comments: Mental Status: Patient is awake, alert, oriented to self Family reports she is close to her baseline Has baseline aphasia but able to speak in short sentences and answer some questions appropriately Cranial Nerves: Exam limited by patient's understanding of instructions.   Motor: Grossly intact upper and lower extremities bilaterally     Labs on Admission: I have personally reviewed following labs and imaging studies  CBC: Recent Labs  Lab 10/29/22 1159 10/29/22 1201  WBC  --  5.5  NEUTROABS  --  3.8  HGB 11.2* 12.0  HCT 33.0* 34.2*  MCV  --  92.7  PLT  --  706    Basic Metabolic Panel: Recent Labs  Lab 10/29/22 1159 10/29/22 1201  NA 130* 128*  K 4.5 4.3  CL 102 101  CO2  --  18*  GLUCOSE 93 95  BUN 23 22  CREATININE 1.00 1.09*  CALCIUM  --  8.4*    GFR: Estimated Creatinine Clearance: 43.7 mL/min (A) (by C-G formula based on SCr of 1.09 mg/dL (H)).  Liver Function Tests: Recent Labs  Lab 10/29/22 1201  AST 25  ALT 20  ALKPHOS 59  BILITOT 0.6  PROT 5.7*  ALBUMIN 3.2*    Urine analysis:    Component Value Date/Time   COLORURINE YELLOW 10/29/2022 1419   APPEARANCEUR CLEAR 10/29/2022 1419   LABSPEC >1.046 (H) 10/29/2022 1419   PHURINE 8.0 10/29/2022 1419   GLUCOSEU NEGATIVE 10/29/2022 1419   HGBUR NEGATIVE 10/29/2022 1419   BILIRUBINUR NEGATIVE 10/29/2022 1419   KETONESUR 5 (A) 10/29/2022 1419   PROTEINUR NEGATIVE 10/29/2022 1419   NITRITE NEGATIVE 10/29/2022  [place] 10/29/2022 1419    Radiological Exams on Admission: MR BRAIN WO CONTRAST  Result Date: 10/29/2022 CLINICAL DATA:  Neuro deficit, acute, stroke suspected EXAM: MRI HEAD WITHOUT CONTRAST  TECHNIQUE: Multiplanar, multiecho pulse sequences of the brain and surrounding structures were obtained without intravenous contrast. COMPARISON:  CTA head/neck from the same day. FINDINGS: Brain: No acute infarction, acute hemorrhage, hydrocephalus, extra-axial collection or mass lesion. Multiple small foci of susceptibility artifact throughout bilateral cerebral hemispheres, compatible with prior microhemorrhages. 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 Cerebral atrophy. Left occipital encephalomalacia. Vascular: Major arterial flow voids are maintained skull base. Skull and upper cervical spine: Normal marrow signal. Sinuses/Orbits: Mild paranasal sinus mucosal thickening. No acute bowel findings. Other: No mastoid effusions. IMPRESSION: 1. No evidence of acute intracranial abnormality. 2. 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 Electronically Signed   By: Margaretha Sheffield M.D.   On: 10/29/2022 15:35   CT ANGIO HEAD NECK W WO CM (CODE STROKE)  Result Date: 10/29/2022 CLINICAL DATA:  Neuro deficit, acute, stroke suspected EXAM: CT ANGIOGRAPHY HEAD AND NECK TECHNIQUE: Multidetector CT imaging of the head and neck was performed using the standard protocol during bolus administration of intravenous contrast. Multiplanar CT image reconstructions and MIPs were obtained to evaluate the vascular anatomy. Carotid stenosis measurements (when applicable) are obtained utilizing NASCET criteria, using the distal internal carotid diameter as the denominator. RADIATION DOSE REDUCTION: This exam was performed according to the departmental dose-optimization program which includes automated exposure control, adjustment of the mA and/or kV according to patient size and/or use of iterative reconstruction technique. CONTRAST:  161m OMNIPAQUE IOHEXOL 350 MG/ML SOLN COMPARISON:  None Available. FINDINGS: CT HEAD FINDINGS Brain: No  evidence of acute infarction, hemorrhage, hydrocephalus, extra-axial collection or mass lesion/mass effect. Cerebral atrophy. Patchy white matter hypodensities, compatible with chronic microvascular ischemic disease. Vascular: Detailed below. Skull: No acute fracture. Sinuses/Orbits: No acute finding. Other: No mastoid effusions. Review of the MIP images confirms the above findings CTA NECK FINDINGS Aortic arch: Great vessel origins are patent without significant stenosis. Right carotid system: No evidence of dissection, stenosis (50% or greater), or occlusion. Left carotid system: No evidence of dissection, stenosis (50% or greater), or occlusion. Vertebral arteries: Left dominant. No evidence of dissection, stenosis (50% or greater), or occlusion. Skeleton: Moderate multilevel degenerative change. Other neck: Heterogeneous and enlarged thyroid, further characterized on prior ultrasound from October 13, 2008. Upper chest: Visualized lung apices are clear. Review of the MIP images confirms the above findings CTA HEAD FINDINGS Anterior circulation: Bilateral intracranial ICAs, MCAs none, and ACAs are patent without proximal hemodynamically significant stenosis. Posterior circulation: Bilateral intradural vertebral arteries, basilar artery and bilateral posterior cerebral arteries are patent without proximal hemodynamically significant stenosis. Venous sinuses: As permitted by contrast timing, patent. Review of the MIP images confirms the above findings IMPRESSION: No emergent large vessel occlusion or proximal hemodynamically stenosis. Electronically Signed   By: FMargaretha SheffieldM.D.   On: 10/29/2022 12:38   DG Chest Port 1 View  Result Date: 10/29/2022 CLINICAL DATA:  Sepsis. EXAM: PORTABLE CHEST 1 VIEW COMPARISON:  12/31/2018 FINDINGS: The cardiac silhouette, mediastinal and hilar contours are normal. The lungs are clear. No infiltrates or effusions. No pulmonary lesions. The bony thorax is intact.  IMPRESSION: No acute cardiopulmonary findings. Electronically Signed   By: PMarijo SanesM.D.   On: 10/29/2022 12:36   CT HEAD CODE STROKE WO CONTRAST  Result  Date: 10/29/2022 CLINICAL DATA:  Code stroke.  No history provided. EXAM: CT HEAD WITHOUT CONTRAST TECHNIQUE: Contiguous axial images were obtained from the base of the skull through the vertex without intravenous contrast. RADIATION DOSE REDUCTION: This exam was performed according to the departmental dose-optimization program which includes automated exposure control, adjustment of the mA and/or kV according to patient size and/or use of iterative reconstruction technique. COMPARISON:  MRI Brain 04/28/16. FINDINGS: Brain: No evidence of acute infarction, hemorrhage, hydrocephalus, extra-axial collection or mass lesion/mass effect. There is sequela of severe chronic microvascular ischemic change with advanced generalized volume loss that is slight left temporal lobe predominance. Vascular: No hyperdense vessel or unexpected calcification. Skull: Normal. Negative for fracture or focal lesion. Sinuses/Orbits: No acute finding. Polypoid mucosal thickening of the floor of the left maxillary sinus. Other: None ASPECTS ([place] Stroke Program Early CT Score) 10 IMPRESSION: No hemorrhage or CT evidence of an acute infarct. Findings were paged to Dr. Leonie Man on 10/29/22 at 11:39 AM via AMION paging system Electronically Signed   By: Marin Roberts M.D.   On: 10/29/2022 11:40    EKG: Independently reviewed.  Sinus rhythm at 94 bpm.  PAC noted.  Nonspecific ST changes.  Multiple leads with mild baseline artifact and mild baseline wander.  Assessment/Plan Principal Problem:   Acute encephalopathy Active Problems:   Hyperlipidemia   Hypothyroidism   History of hemorrhagic cerebrovascular accident (CVA) without residual deficits   Paroxysmal atrial fibrillation (HCC)   OSA on CPAP   COVID-19 virus infection   Acute encephalopathy Rule out TIA History of  hemorrhagic stroke COVID-19 infection > Patient presenting with acute onset inability to speak, balance issues, incontinence. > Does have some dementia and prior stroke with mild aphasia but able to converse in short sentences.  Unable to converse or follow commands on arrival.  Did arrive as a code stroke. > Initial imaging workup with CT head and CTA head and neck unrevealing.   > COVID-19 did come back positive though no findings on chest x-ray, this could explain everything however with acute onset of symptoms MRI pending and TIA workup ongoing. > Neurology is following recommending TIA/CVA workup as well as evaluation for seizure or infectious etiology.  Per note neurology plans to follow if MRI is positive for acute stroke. - Appreciate neurology recommendations - For now, will allow for permissive HTN (systolic < 144 and diastolic < 315)  - Hold off on ASA pending MRI result - Home statin in the setting of Paxlovid as below - Echocardiogram  - A1C  - Lipid panel  - Tele monitoring  - SLP eval - PT/OT ADDENDUM > Patient is nearly back to baseline when seen.  Thus far no significant invention other than minimal IV fluids and Tylenol. > MRI comes back negative for acute abnormality. > Unclear if this indicates COVID/fever as only etiology for symptoms.  TIA could remain a possibility though less likely (does have history of A-fib not currently on any medication as she is currently in sinus rhythm). - Continue with TIA workup as above - Treat COVID as below  COVID-19 infection > As above noted to have COVID-19 infection when tested in the ED. > Chest x-ray clear.  Urine clear.  No leukocytosis also pointing towards viral infection. > Did spike fever of 101.5 in the ED. family now reporting a couple days of congestion. > Unclear if this is the primary source for acute encephalopathy as above for if she has multiple issues simultaneously. -  Started on Paxlovid in the ED - Holding  atorvastatin in the setting of Paxlovid - Daily labs  Hyponatremia > Negative sodium 128 in the ED. Has received 500 cc bolus and started on 150 cc an hour. - Recheck sodium now and continue to trend - Switch fluid to normal saline and reduce fluid rate to 75 cc an hour given stable blood pressure.  History of atrial fibrillation - Currently in sinus rhythm and not on any antiarrhythmics, rate control medications, nor anticoagulation  OSA - Continue CPAP  Hypothyroidism - Continue home Synthroid  Hyperlipidemia - Holding home atorvastatin as above  Anxiety - Continue home duloxetine - Continue home Ativan as needed  History of breast cancer - Noted  DVT prophylaxis: SCDs for now Code Status:   DNR, family wants all appropriate medical treatment otherwise Family Communication:  Updated at bedside Disposition Plan:   Patient is from:  Home  Anticipated DC to:  Pending clinical course  Anticipated DC date:  1 to 5 days  Anticipated DC barriers: Pending workup and improvement in deficits  Consults called:  Neurology Admission status:  Observation, telemetry  Severity of Illness: The appropriate patient status for this patient is OBSERVATION. Observation status is judged to be reasonable and necessary in order to provide the required intensity of service to ensure the patient's safety. The patient's presenting symptoms, physical exam findings, and initial radiographic and laboratory data in the context of their medical condition is felt to place them at decreased risk for further clinical deterioration. Furthermore, it is anticipated that the patient will be medically stable for discharge from the hospital within 2 midnights of admission.    Marcelyn Bruins MD [facility]  How to contact the [facility] Attending or Consulting provider [place] or covering provider during after hours [place], for this patient?   Check the care team in [facility] and look for a) attending/consulting  TR[facility] provider listed and b) the [facility] team listed Log into [email or web address] and use [place]'s universal password to access. If you do not have the password, please contact the hospital operator. Locate the [facility] provider you are looking for under [facility] and page to a number that you can be directly reached. If you still have difficulty reaching the provider, please page the [facility] (Director on Call) for the Hospitalists listed on amion for assistance.  10/29/2022, 3:51 PM

## 2022-10-29 NOTE — Code Documentation (Signed)
Barbara [name] is a 71 yr old female arriving to [facility] on 10/29/2022 with a PMH of ICH and dementia. She is not on blood thinners. Pt is from home where she was LKW (at her baseline) at 0830 today. Husband checked on her later and found her unable to speak, and incontinent. Code stroke alert activated in triage.    Pt was met by Stroke team in CT. Labs, CBG obtained. BP obtained. NIHSS 10 (for LOC questions, commands, aphasia, dysarthria and bilateral arm drift.) She is trembling and appears scared. CTNC neg for hemorrhage per Dr Leonie Man. PIV obtained by RRN. CTA obtained. Pt unable to be still, so 1 mg ativan given and scan repeated. Per Dr Leonie Man, no LVO.     Pt taken back to Trauma C where her workup will continue. She will need q 2 hr VS and NIHSS. Bedside handoff with ED RN complete. Pt ineligible for thrombolysis due to history ICH. Pt ineligible for NIR as LVO negative.

## 2022-10-30 ENCOUNTER — Inpatient Hospital Stay (HOSPITAL_COMMUNITY): Payer: PPO

## 2022-10-30 ENCOUNTER — Encounter: Payer: Self-pay | Admitting: Oncology

## 2022-10-30 ENCOUNTER — Observation Stay (HOSPITAL_BASED_OUTPATIENT_CLINIC_OR_DEPARTMENT_OTHER): Payer: PPO

## 2022-10-30 ENCOUNTER — Other Ambulatory Visit (HOSPITAL_COMMUNITY): Payer: Self-pay

## 2022-10-30 DIAGNOSIS — G9340 Encephalopathy, unspecified: Secondary | ICD-10-CM | POA: Diagnosis not present

## 2022-10-30 DIAGNOSIS — G459 Transient cerebral ischemic attack, unspecified: Secondary | ICD-10-CM

## 2022-10-30 DIAGNOSIS — U071 COVID-19: Secondary | ICD-10-CM | POA: Diagnosis present

## 2022-10-30 LAB — URINE CULTURE: Culture: NO GROWTH

## 2022-10-30 LAB — CBC WITH DIFFERENTIAL/PLATELET
Abs Immature Granulocytes: 0.02 10*3/uL (ref 0.00–0.07)
Basophils Absolute: 0 10*3/uL (ref 0.0–0.1)
Basophils Relative: 0 %
Eosinophils Absolute: 0 10*3/uL (ref 0.0–0.5)
Eosinophils Relative: 0 %
HCT: 39.2 % (ref 36.0–46.0)
Hemoglobin: 13.4 g/dL (ref 12.0–15.0)
Immature Granulocytes: 0 %
Lymphocytes Relative: 20 %
Lymphs Abs: 1.2 10*3/uL (ref 0.7–4.0)
MCH: 31.7 pg (ref 26.0–34.0)
MCHC: 34.2 g/dL (ref 30.0–36.0)
MCV: 92.7 fL (ref 80.0–100.0)
Monocytes Absolute: 0.9 10*3/uL (ref 0.1–1.0)
Monocytes Relative: 15 %
Neutro Abs: 3.9 10*3/uL (ref 1.7–7.7)
Neutrophils Relative %: 65 %
Platelets: 185 10*3/uL (ref 150–400)
RBC: 4.23 MIL/uL (ref 3.87–5.11)
RDW: 12.7 % (ref 11.5–15.5)
WBC: 6.1 10*3/uL (ref 4.0–10.5)
nRBC: 0 % (ref 0.0–0.2)

## 2022-10-30 LAB — COMPREHENSIVE METABOLIC PANEL
ALT: 25 U/L (ref 0–44)
AST: 27 U/L (ref 15–41)
Albumin: 3.5 g/dL (ref 3.5–5.0)
Alkaline Phosphatase: 66 U/L (ref 38–126)
Anion gap: 7 (ref 5–15)
BUN: 14 mg/dL (ref 8–23)
CO2: 21 mmol/L — ABNORMAL LOW (ref 22–32)
Calcium: 8.9 mg/dL (ref 8.9–10.3)
Chloride: 111 mmol/L (ref 98–111)
Creatinine, Ser: 1.04 mg/dL — ABNORMAL HIGH (ref 0.44–1.00)
GFR, Estimated: 57 mL/min — ABNORMAL LOW (ref >60)
Glucose, Bld: 101 mg/dL — ABNORMAL HIGH (ref 70–99)
Potassium: 4.2 mmol/L (ref 3.5–5.1)
Sodium: 139 mmol/L (ref 135–145)
Total Bilirubin: 0.6 mg/dL (ref 0.3–1.2)
Total Protein: 6.6 g/dL (ref 6.5–8.1)

## 2022-10-30 LAB — ECHOCARDIOGRAM COMPLETE
Area-P 1/2: 3.6 cm2
Calc EF: 56.8 %
Height: 66 in
MV M vel: 2.34 m/s
MV Peak grad: 21.9 mmHg
S' Lateral: 3 cm
Single Plane A2C EF: 47 %
Single Plane A4C EF: 63.9 %
Weight: 2405.66 oz

## 2022-10-30 LAB — HEMOGLOBIN A1C
Hgb A1c MFr Bld: 5.7 % — ABNORMAL HIGH (ref 4.8–5.6)
Mean Plasma Glucose: 117 mg/dL

## 2022-10-30 LAB — FERRITIN: Ferritin: 87 ng/mL (ref 11–307)

## 2022-10-30 LAB — LIPID PANEL
Cholesterol: 149 mg/dL (ref 0–200)
HDL: 45 mg/dL (ref >40)
LDL Cholesterol: 94 mg/dL (ref 0–99)
Total CHOL/HDL Ratio: 3.3 RATIO
Triglycerides: 49 mg/dL (ref <150)
VLDL: 10 mg/dL (ref 0–40)

## 2022-10-30 LAB — C-REACTIVE PROTEIN: CRP: 0.6 mg/dL (ref <1.0)

## 2022-10-30 MED ORDER — HALOPERIDOL LACTATE 5 MG/ML IJ SOLN: 0.5000 mg | Freq: Four times a day (QID) | INTRAMUSCULAR | Status: DC | PRN

## 2022-10-30 MED ORDER — NIRMATRELVIR/RITONAVIR (PAXLOVID) TABLET (RENAL DOSING)
2.0000 | ORAL_TABLET | Freq: Two times a day (BID) | ORAL | 0 refills | Status: DC
  Filled 2022-10-30: qty 20, 5d supply, fill #0

## 2022-10-30 MED ORDER — QUETIAPINE FUMARATE 25 MG PO TABS
12.5000 mg | ORAL_TABLET | Freq: Every day | ORAL | Status: DC
  Administered 2022-10-30: 12.5 mg via ORAL
  Filled 2022-10-30: qty 1

## 2022-10-30 MED ORDER — ATORVASTATIN CALCIUM 20 MG PO TABS: 20.0000 mg | ORAL_TABLET | Freq: Every day | ORAL | Status: DC

## 2022-10-30 NOTE — Progress Notes (Signed)
  Transition of Care [facility]) Screening Note   Patient Details  Name: Barbara [name] Date of Birth: [birth year]   Transition of Care [facility]) CM/SW Contact:    Pollie Friar, RN Phone Number: 10/30/2022, 1:44 PM   Pt is from home with her spouse. No f/u per PT/OT and ST.  Transition of Care Department [facility]) has reviewed patient. We will continue to monitor patient advancement through interdisciplinary progression rounds. If new patient transition needs arise, please place a TOC consult.

## 2022-10-30 NOTE — Care Management CC44 (Signed)
Condition Code 44 Documentation Completed  Patient Details  Name: Barbara [name] MRN: [id number] Date of Birth: [birth year]   Condition Code 44 given:  Yes Patient signature on Condition Code 44 notice:  Yes Documentation of 2 MD's agreement:  Yes Code 44 added to claim:  Yes    Ninfa Meeker, RN 10/30/2022, 4:38 PM

## 2022-10-30 NOTE — Discharge Summary (Signed)
[facility]  Physician Discharge Summary   Patient ID: Barbara [name] MRN: [id number] DOB/AGE: October 07, 1951 71 y.o.  Admit date: 10/29/2022 Discharge date: 10/30/2022    PCP: Kelton Pillar, MD  DISCHARGE DIAGNOSES:    Acute encephalopathy secondary to COVID-19   Hyperlipidemia   Hypothyroidism   History of hemorrhagic cerebrovascular accident (CVA) without residual deficits   Paroxysmal atrial fibrillation (HCC)   OSA on CPAP   COVID-19 virus infection   RECOMMENDATIONS FOR OUTPATIENT FOLLOW UP: Outpatient follow-up with primary care provider Statin currently on hold due to Paxlovid treatment.  Recommend dose of statin to be increased at follow-up due to elevated LDL at 94.   Home Health: None Equipment/Devices: None  CODE STATUS: DNR  DISCHARGE CONDITION: fair  Diet recommendation: As before  INITIAL HISTORY: 71 y.o. female with medical history significant of dementia, hemorrhagic stroke, atrial fibrillation, OSA on CPAP, breast cancer, hypothyroidism, hyperlipidemia, anxiety presenting with acute encephalopathy/focal neurologic deficits.  She has aphasia at baseline but this was thought to be worse by her husband at the time of presentation.  Code stroke was initially called.  Patient was seen by neurology.  Hospitalized for further management.    Consultants: Neurology   Procedures: EEG and echocardiogram   HOSPITAL COURSE:   Acute encephalopathy secondary to COVID-19 Mentation seems to be back to baseline.  Does not have any focal neurological deficits.  MRI brain was negative for acute stroke.  CT angiogram did not show any large vessel occlusions. EEG was negative for seizure or epileptiform activity. Symptoms most likely secondary to COVID-19.  She was noted to be febrile in the emergency department. Echocardiogram showed normal systolic function.  Patient seen by PT and OT.  No follow-up recommended. LDL is 94.  Will recommend dose of atorvastatin  be increased after she has completed course of Paxlovid. HbA1c is 5.7.   COVID-19 infection Did not have any significant respiratory symptoms.  Started on Paxlovid.  CRP was 0.6.     Previous history of hemorrhagic stroke No recurrence noted on imaging studies.  She has significant expressive aphasia as a result of her previous stroke.   Hyponatremia Improved after she was given IV fluids.   History of atrial fibrillation Currently in sinus rhythm.  Not noted to be on any rate control medications or anticoagulation.  Will defer this to outpatient providers.   Hypothyroidism Continue levothyroxine.   Hyperlipidemia Holding atorvastatin for now as she is on Paxlovid.  Will recommend increasing the dose of atorvastatin at follow-up due to elevated LDL of 94.   Anxiety Continue home medications.   History of obstructive sleep apnea Apparently does not use CPAP at home.    Since workup was completed patient's family requested discharge to avoid further worsening of patient's anxiety.  Patient was otherwise stable.  Okay for discharge.   PERTINENT LABS:  The results of significant diagnostics from this hospitalization (including imaging, microbiology, ancillary and laboratory) are listed below for reference.    Microbiology: Recent Results (from the past 240 hour(s))  Resp Panel by RT-PCR (Flu A&B, Covid) Anterior Nasal Swab     Status: Abnormal   Collection Time: 10/29/22 12:08 PM   Specimen: Anterior Nasal Swab  Result Value Ref Range Status   SARS Coronavirus 2 by RT PCR POSITIVE (A) NEGATIVE Final    Comment: (NOTE) SARS-CoV-2 target nucleic acids are DETECTED.  The SARS-CoV-2 RNA is generally detectable in upper respiratory specimens during the acute phase of infection. Positive results are  indicative of the presence of the identified virus, but do not rule out bacterial infection or co-infection with other pathogens not detected by the test. Clinical correlation with  patient history and other diagnostic information is necessary to determine patient infection status. The expected result is Negative.  Fact Sheet for Patients: EntrepreneurPulse.com.au  Fact Sheet for Healthcare Providers: IncredibleEmployment.be  This test is not yet approved or cleared by the [place] FDA and  has been authorized for detection and/or diagnosis of SARS-CoV-2 by FDA under an Emergency Use Authorization (EUA).  This EUA will remain in effect (meaning this test can be used) for the duration of  the COVID-19 declaration under Section 564(b)(1) of the A ct, 21 U.S.C. section 360bbb-3(b)(1), unless the authorization is terminated or revoked sooner.     Influenza A by PCR NEGATIVE NEGATIVE Final   Influenza B by PCR NEGATIVE NEGATIVE Final    Comment: (NOTE) The Xpert Xpress SARS-CoV-2/FLU/RSV plus assay is intended as an aid in the diagnosis of influenza from Nasopharyngeal swab specimens and should not be used as a sole basis for treatment. Nasal washings and aspirates are unacceptable for Xpert Xpress SARS-CoV-2/FLU/RSV testing.  Fact Sheet for Patients: EntrepreneurPulse.com.au  Fact Sheet for Healthcare Providers: IncredibleEmployment.be  This test is not yet approved or cleared by the [place] FDA and has been authorized for detection and/or diagnosis of SARS-CoV-2 by FDA under an Emergency Use Authorization (EUA). This EUA will remain in effect (meaning this test can be used) for the duration of the COVID-19 declaration under Section 564(b)(1) of the Act, 21 U.S.C. section 360bbb-3(b)(1), unless the authorization is terminated or revoked.  Performed at [facility], [place] [address] [place], [place] [address]   Blood Culture (routine x 2)     Status: None (Preliminary result)   Collection Time: 10/29/22 12:44 PM   Specimen: BLOOD RIGHT FOREARM  Result Value Ref Range  Status   Specimen Description BLOOD RIGHT FOREARM  Final   Special Requests   Final    BOTTLES DRAWN AEROBIC AND ANAEROBIC Blood Culture adequate volume   Culture   Final    NO GROWTH 2 DAYS Performed at [facility], [place] [address] [place], [place] [address]    Report Status PENDING  Incomplete  Blood Culture (routine x 2)     Status: None (Preliminary result)   Collection Time: 10/29/22 12:50 PM   Specimen: BLOOD RIGHT HAND  Result Value Ref Range Status   Specimen Description BLOOD RIGHT HAND  Final   Special Requests   Final    BOTTLES DRAWN AEROBIC AND ANAEROBIC Blood Culture adequate volume   Culture   Final    NO GROWTH 2 DAYS Performed at [facility], [place] [address] [place], [place] [address]    Report Status PENDING  Incomplete  Urine Culture     Status: None   Collection Time: 10/29/22  2:19 PM   Specimen: In/Out Cath Urine  Result Value Ref Range Status   Specimen Description IN/OUT CATH URINE  Final   Special Requests NONE  Final   Culture   Final    NO GROWTH Performed at [facility], [place] [address], [place], [place] [address]    Report Status 10/30/2022 FINAL  Final     Labs:   Basic Metabolic Panel: Recent Labs  Lab 10/29/22 1159 10/29/22 1201 10/29/22 1842 10/30/22 0535  NA 130* 128* 134* 139  K 4.5 4.3 3.8 4.2  CL 102 101 103 111  CO2  --  18* 20* 21*  GLUCOSE 93 95 182* 101*  BUN '23 22 17 14  '$ CREATININE 1.00 1.09* 1.13* 1.04*  CALCIUM  --  8.4* 8.8* 8.9   Liver Function Tests: Recent Labs  Lab 10/29/22 1201 10/30/22 0535  AST 25 27  ALT 20 25  ALKPHOS 59 66  BILITOT 0.6 0.6  PROT 5.7* 6.6  ALBUMIN 3.2* 3.5    CBC: Recent Labs  Lab 10/29/22 1159 10/29/22 1201 10/30/22 0535  WBC  --  5.5 6.1  NEUTROABS  --  3.8 3.9  HGB 11.2* 12.0 13.4  HCT 33.0* 34.2* 39.2  MCV  --  92.7 92.7  PLT  --  178 185     CBG: Recent Labs  Lab 10/29/22 1133  GLUCAP 85     IMAGING STUDIES ECHOCARDIOGRAM  COMPLETE  Result Date: 10/30/2022    ECHOCARDIOGRAM REPORT   Patient Name:   Barbara [name] Date of Exam: 10/30/2022 Medical Rec #:  [id number]        Height:       66.0 in Accession #:    [id number]       Weight:       150.4 lb Date of Birth:  10-16-1951        BSA:          1.771 m Patient Age:    22 years         BP:           143/79 mmHg Patient Gender: F                HR:           70 bpm. Exam Location:  Inpatient Procedure: 2D Echo, Cardiac Doppler and Color Doppler Indications:    Acute focal neurological deficit [730320]  History:        Patient has prior history of Echocardiogram examinations, most                 recent 12/26/2015. Stroke, Arrythmias:Atrial Fibrillation; Risk                 Factors:Dyslipidemia and Non-Smoker.  Sonographer:    Greer Pickerel Referring Phys: [id number] Candace Gallus MELVIN  Sonographer Comments: Image acquisition challenging due to breast implants and Image acquisition challenging due to respiratory motion. IMPRESSIONS  1. Left ventricular ejection fraction, by estimation, is 55 to 60%. The left ventricle has normal function. The left ventricle has no regional wall motion abnormalities. Left ventricular diastolic parameters were normal.  2. Right ventricular systolic function is normal. The right ventricular size is normal. There is normal pulmonary artery systolic pressure.  3. Right atrial size was mildly dilated.  4. The mitral valve is normal in structure. No evidence of mitral valve regurgitation. No evidence of mitral stenosis.  5. The aortic valve is normal in structure. Aortic valve regurgitation is not visualized. Aortic valve sclerosis is present, with no evidence of aortic valve stenosis.  6. The inferior vena cava is normal in size with greater than 50% respiratory variability, suggesting right atrial pressure of 3 mmHg. FINDINGS  Left Ventricle: Left ventricular ejection fraction, by estimation, is 55 to 60%. The left ventricle has normal function. The left  ventricle has no regional wall motion abnormalities. The left ventricular internal cavity size was normal in size. There is  no left ventricular hypertrophy. Left ventricular diastolic parameters were normal. Right Ventricle: The right ventricular size  is normal. No increase in right ventricular wall thickness. Right ventricular systolic function is normal. There is normal pulmonary artery systolic pressure. The tricuspid regurgitant velocity is 1.92 m/s, and  with an assumed right atrial pressure of 8 mmHg, the estimated right ventricular systolic pressure is 16.1 mmHg. Left Atrium: Left atrial size was normal in size. Right Atrium: Right atrial size was mildly dilated. Pericardium: There is no evidence of pericardial effusion. Mitral Valve: The mitral valve is normal in structure. No evidence of mitral valve regurgitation. No evidence of mitral valve stenosis. Tricuspid Valve: The tricuspid valve is normal in structure. Tricuspid valve regurgitation is not demonstrated. No evidence of tricuspid stenosis. Aortic Valve: The aortic valve is normal in structure. Aortic valve regurgitation is not visualized. Aortic valve sclerosis is present, with no evidence of aortic valve stenosis. Pulmonic Valve: The pulmonic valve was normal in structure. Pulmonic valve regurgitation is not visualized. No evidence of pulmonic stenosis. Aorta: The aortic root is normal in size and structure. Venous: The inferior vena cava is normal in size with greater than 50% respiratory variability, suggesting right atrial pressure of 3 mmHg. IAS/Shunts: No atrial level shunt detected by color flow Doppler.  LEFT VENTRICLE PLAX 2D LVIDd:         3.90 cm     Diastology LVIDs:         3.00 cm     LV e' medial:    10.40 cm/s LV PW:         0.70 cm     LV E/e' medial:  8.5 LV IVS:        0.70 cm     LV e' lateral:   11.00 cm/s LVOT diam:     1.70 cm     LV E/e' lateral: 8.0 LV SV:         36 LV SV Index:   21 LVOT Area:     2.27 cm  LV Volumes  (MOD) LV vol d, MOD A2C: 45.5 ml LV vol d, MOD A4C: 64.9 ml LV vol s, MOD A2C: 24.1 ml LV vol s, MOD A4C: 23.4 ml LV SV MOD A2C:     21.4 ml LV SV MOD A4C:     64.9 ml LV SV MOD BP:      31.3 ml RIGHT VENTRICLE RV S prime:     16.40 cm/s TAPSE (M-mode): 2.1 cm LEFT ATRIUM           Index        RIGHT ATRIUM           Index LA Vol (A2C): 9.1 ml  5.15 ml/m   RA Area:     20.90 cm LA Vol (A4C): 26.8 ml 15.13 ml/m  RA Volume:   66.40 ml  37.48 ml/m  AORTIC VALVE LVOT Vmax:   79.30 cm/s LVOT Vmean:  51.800 cm/s LVOT VTI:    0.160 m  AORTA Ao Root diam: 3.30 cm Ao Asc diam:  3.00 cm MITRAL VALVE               TRICUSPID VALVE MV Area (PHT): 3.60 cm    TR Peak grad:   14.7 mmHg MV Decel Time: 211 msec    TR Vmax:        192.00 cm/s MR Peak grad: 21.9 mmHg MR Vmax:      234.00 cm/s  SHUNTS MV E velocity: 88.50 cm/s  Systemic VTI:  0.16 m MV A velocity: 83.20 cm/s  Systemic Diam:  1.70 cm MV E/A ratio:  1.06 Kardie Tobb DO Electronically signed by Berniece Salines DO Signature Date/Time: 10/30/2022/11:49:36 AM    Final    MR BRAIN WO CONTRAST  Result Date: 10/29/2022 CLINICAL DATA:  Neuro deficit, acute, stroke suspected EXAM: MRI HEAD WITHOUT CONTRAST TECHNIQUE: Multiplanar, multiecho pulse sequences of the brain and surrounding structures were obtained without intravenous contrast. COMPARISON:  CTA head/neck from the same day. FINDINGS: Brain: No acute infarction, acute hemorrhage, hydrocephalus, extra-axial collection or mass lesion. Multiple small foci of susceptibility artifact throughout bilateral cerebral hemispheres, compatible with prior microhemorrhages. 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 Cerebral atrophy. Left occipital encephalomalacia. Vascular: Major arterial flow voids are maintained skull base. Skull and upper cervical spine: Normal marrow signal. Sinuses/Orbits: Mild paranasal sinus mucosal thickening. No acute bowel findings. Other: No mastoid effusions. IMPRESSION: 1. No  evidence of acute intracranial abnormality. 2. 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 Electronically Signed   By: Margaretha Sheffield M.D.   On: 10/29/2022 15:35   CT ANGIO HEAD NECK W WO CM (CODE STROKE)  Result Date: 10/29/2022 CLINICAL DATA:  Neuro deficit, acute, stroke suspected EXAM: CT ANGIOGRAPHY HEAD AND NECK TECHNIQUE: Multidetector CT imaging of the head and neck was performed using the standard protocol during bolus administration of intravenous contrast. Multiplanar CT image reconstructions and MIPs were obtained to evaluate the vascular anatomy. Carotid stenosis measurements (when applicable) are obtained utilizing NASCET criteria, using the distal internal carotid diameter as the denominator. RADIATION DOSE REDUCTION: This exam was performed according to the departmental dose-optimization program which includes automated exposure control, adjustment of the mA and/or kV according to patient size and/or use of iterative reconstruction technique. CONTRAST:  161m OMNIPAQUE IOHEXOL 350 MG/ML SOLN COMPARISON:  None Available. FINDINGS: CT HEAD FINDINGS Brain: No evidence of acute infarction, hemorrhage, hydrocephalus, extra-axial collection or mass lesion/mass effect. Cerebral atrophy. Patchy white matter hypodensities, compatible with chronic microvascular ischemic disease. Vascular: Detailed below. Skull: No acute fracture. Sinuses/Orbits: No acute finding. Other: No mastoid effusions. Review of the MIP images confirms the above findings CTA NECK FINDINGS Aortic arch: Great vessel origins are patent without significant stenosis. Right carotid system: No evidence of dissection, stenosis (50% or greater), or occlusion. Left carotid system: No evidence of dissection, stenosis (50% or greater), or occlusion. Vertebral arteries: Left dominant. No evidence of dissection, stenosis (50% or greater), or occlusion. Skeleton: Moderate  multilevel degenerative change. Other neck: Heterogeneous and enlarged thyroid, further characterized on prior ultrasound from October 13, 2008. Upper chest: Visualized lung apices are clear. Review of the MIP images confirms the above findings CTA HEAD FINDINGS Anterior circulation: Bilateral intracranial ICAs, MCAs none, and ACAs are patent without proximal hemodynamically significant stenosis. Posterior circulation: Bilateral intradural vertebral arteries, basilar artery and bilateral posterior cerebral arteries are patent without proximal hemodynamically significant stenosis. Venous sinuses: As permitted by contrast timing, patent. Review of the MIP images confirms the above findings IMPRESSION: No emergent large vessel occlusion or proximal hemodynamically stenosis. Electronically Signed   By: FMargaretha SheffieldM.D.   On: 10/29/2022 12:38   DG Chest Port 1 View  Result Date: 10/29/2022 CLINICAL DATA:  Sepsis. EXAM: PORTABLE CHEST 1 VIEW COMPARISON:  12/31/2018 FINDINGS: The cardiac silhouette, mediastinal and hilar contours are normal. The lungs are clear. No infiltrates or effusions. No pulmonary lesions. The bony thorax is intact. IMPRESSION: No acute cardiopulmonary findings. Electronically Signed   By: PMarijo SanesM.D.   On:  10/29/2022 12:36   CT HEAD CODE STROKE WO CONTRAST  Result Date: 10/29/2022 CLINICAL DATA:  Code stroke.  No history provided. EXAM: CT HEAD WITHOUT CONTRAST TECHNIQUE: Contiguous axial images were obtained from the base of the skull through the vertex without intravenous contrast. RADIATION DOSE REDUCTION: This exam was performed according to the departmental dose-optimization program which includes automated exposure control, adjustment of the mA and/or kV according to patient size and/or use of iterative reconstruction technique. COMPARISON:  MRI Brain 04/28/16. FINDINGS: Brain: No evidence of acute infarction, hemorrhage, hydrocephalus, extra-axial collection or mass  lesion/mass effect. There is sequela of severe chronic microvascular ischemic change with advanced generalized volume loss that is slight left temporal lobe predominance. Vascular: No hyperdense vessel or unexpected calcification. Skull: Normal. Negative for fracture or focal lesion. Sinuses/Orbits: No acute finding. Polypoid mucosal thickening of the floor of the left maxillary sinus. Other: None ASPECTS ([place] Stroke Program Early CT Score) 10 IMPRESSION: No hemorrhage or CT evidence of an acute infarct. Findings were paged to Dr. Leonie Man on 10/29/22 at 11:39 AM via AMION paging system Electronically Signed   By: Marin Roberts M.D.   On: 10/29/2022 11:40    DISCHARGE EXAMINATION: See progress note from earlier today  DISPOSITION: Home  Discharge Instructions     Call MD for:  difficulty breathing, headache or visual disturbances   Complete by: As directed    Call MD for:  extreme fatigue   Complete by: As directed    Call MD for:  persistant dizziness or light-headedness   Complete by: As directed    Call MD for:  persistant nausea and vomiting   Complete by: As directed    Call MD for:  severe uncontrolled pain   Complete by: As directed    Discharge instructions   Complete by: As directed    Please do not take Atorvastatin until you have completed the course of Paxlovid.  Please take your medications as prescribed.  Follow-up with your primary care provider within 1 week.  You were cared for by a hospitalist during your hospital stay. If you have any questions about your discharge medications or the care you received while you were in the hospital after you are discharged, you can call the unit and asked to speak with the hospitalist on call if the hospitalist that took care of you is not available. Once you are discharged, your primary care physician will handle any further medical issues. Please note that NO REFILLS for any discharge medications will be authorized once you are  discharged, as it is imperative that you return to your primary care physician (or establish a relationship with a primary care physician if you do not have one) for your aftercare needs so that they can reassess your need for medications and monitor your lab values. If you do not have a primary care physician, you can call [phone] for a physician referral.   Increase activity slowly   Complete by: As directed          Allergies as of 10/30/2022       Reactions   Buprenex [buprenorphine] Nausea And Vomiting, Other (See Comments)   Oversedation   Simvastatin Other (See Comments)   Myalgias   Codeine Hives, Itching, Rash        Medication List     STOP taking these medications    HYDROcodone-acetaminophen 5-325 MG tablet Commonly known as: NORCO/VICODIN       TAKE these medications  acetaminophen 500 MG tablet Commonly known as: TYLENOL Take 500 mg by mouth every 6 (six) hours as needed for headache (pain).   atorvastatin 20 MG tablet Commonly known as: LIPITOR Take 1 tablet (20 mg total) by mouth at bedtime. PLEASE RESUME ONLY AFTER YOU HAVE COMPLETE THE COURSE OF PAXLOVID Start taking on: November 04, 2022 What changed:  additional instructions These instructions start on November 04, 2022. If you are unsure what to do until then, ask your doctor or other care provider.   Biotin 5000 MCG Tabs Take 5,000 mcg by mouth every morning.   CALCIUM PO Take 1 tablet by mouth every morning.   Cholecalciferol 125 MCG (5000 UT) Tabs Take 1 tablet (5,000 Units total) by mouth daily. What changed:  when to take this additional instructions   DULoxetine 30 MG capsule Commonly known as: CYMBALTA Take 30 mg by mouth every morning. Take with a 60 mg capsule for a total dose of 90 mg daily   ALLEGRA PO Take 1 tablet by mouth daily as needed (allergies).   fexofenadine 180 MG tablet Commonly known as: ALLEGRA Take 1 tablet (180 mg total) by mouth as needed.    levothyroxine 50 MCG tablet Commonly known as: SYNTHROID Take 50 mcg by mouth daily before breakfast.   lisinopril 10 MG tablet Commonly known as: ZESTRIL Take 10 mg by mouth daily before breakfast.   LORazepam 0.5 MG tablet Commonly known as: ATIVAN Take 0.5 mg by mouth 2 (two) times daily.   QUEtiapine 25 MG tablet Commonly known as: SEROQUEL Take 12.5 mg by mouth See admin instructions. Take 12.5 mg by mouth  in the am and 25 mg by mouth in the pm   Systane Balance 0.6 % Soln Generic drug: Propylene Glycol Place 1 drop into both eyes daily as needed (dry eyes).           TOTAL DISCHARGE TIME: 35 minutes  Jennette Leask [employer] on [email or web address]  10/31/2022, 12:32 PM

## 2022-10-30 NOTE — Evaluation (Signed)
Physical Therapy Evaluation Patient Details Name: Barbara [name] MRN: [id number] DOB: 08-May-1951 Today's Date: 10/30/2022  History of Present Illness  Pt is a 71 y.o. female presenting on 12/6 after being found by husband to be altered and incontinent. + COVID; CT head and CTA head/neck negative. MRI brain negative. PMH significant for dementia, aphasia, hemorrhagic stroke, atrial fibrillation, OSA on CPAP, breast cancer, hypothyroidism, hyperlipidemia, and anxiety.  Clinical Impression   Pt admitted secondary to problem above with deficits below. PTA patient was transferring and ambulating independently inside home without device or UE support.  Pt currently requires minguard assist with slighter wider BOS and altered from her normal gait per husband. Anticipate return to her baseline as she mobilizes more and will not need follow-up PT.  Anticipate patient will benefit from PT to address problems listed below and to maximize her independence prior to discharge home.        Recommendations for follow up therapy are one component of a multi-disciplinary discharge planning process, led by the attending physician.  Recommendations may be updated based on patient status, additional functional criteria and insurance authorization.  Follow Up Recommendations No PT follow up      Assistance Recommended at Discharge Frequent or constant Supervision/Assistance  Patient can return home with the following  Help with stairs or ramp for entrance    Equipment Recommendations None recommended by PT  Recommendations for Other Services       Functional Status Assessment Patient has had a recent decline in their functional status and demonstrates the ability to make significant improvements in function in a reasonable and predictable amount of time.     Precautions / Restrictions Precautions Precautions: Fall      Mobility  Bed Mobility Overal bed mobility: Independent             General  bed mobility comments: from supine, HOB 0 and no rail; incr time to understand what was wanted of her    Transfers Overall transfer level: Needs assistance Equipment used: None Transfers: Sit to/from Stand Sit to Stand: Min guard           General transfer comment: for safety; no imbalance noted    Ambulation/Gait Ambulation/Gait assistance: Min guard Gait Distance (Feet): 30 Feet Assistive device: None Gait Pattern/deviations: Step-through pattern, Decreased stride length   Gait velocity interpretation: 1.31 - 2.62 ft/sec, indicative of limited community ambulator   General Gait Details: ambulated to door and back and then returned to bed and supine without cuing to do so; no imbalance noted, slightly wide BOS and husband reports appears different than her usual gait; guarding for safety; husband reports he will be with her when she gets up  [employer] Mobility    Modified Rankin (Stroke Patients Only)       Balance Overall balance assessment: Mild deficits observed, not formally tested (wide BOS for gait; unable to follow instructions for Berg)                                           Pertinent Vitals/Pain Pain Assessment Pain Assessment: Faces Faces Pain Scale: No hurt    Home Living Family/patient expects to be discharged to:: Private residence Living Arrangements: Spouse/significant other Available Help at Discharge: Family;Available 24 hours/day Type of Home: House Home Access: Stairs to enter  Entrance Stairs-Rails: None Entrance Stairs-Number of Steps: 2 Alternate Level Stairs-Number of Steps: 14 Home Layout: Two level;Bed/bath upstairs Home Equipment: None      Prior Function Prior Level of Function : Needs assist  Cognitive Assist : ADLs (cognitive)           Mobility Comments: independent with transfers and gait ADLs Comments: sits down into tub; husband supervises bathing     Hand Dominance    Dominant Hand: Right    Extremity/Trunk Assessment   Upper Extremity Assessment Upper Extremity Assessment: Defer to OT evaluation    Lower Extremity Assessment Lower Extremity Assessment: Overall WFL for tasks assessed (pt unable to follow instructions for MMT)    Cervical / Trunk Assessment Cervical / Trunk Assessment: Normal  Communication   Communication: Expressive difficulties (husband reports had prior to yesterday)  Cognition Arousal/Alertness: Awake/alert Behavior During Therapy: WFL for tasks assessed/performed Overall Cognitive Status: History of cognitive impairments - at baseline                                 General Comments: unable to state her husband's name (but he reports that is typical); oriented to self and following simple commands with visual and tactile cues        General Comments General comments (skin integrity, edema, etc.): Husband present and reports MD said she will likely go home tomorrow    Exercises     Assessment/Plan    PT Assessment Patient needs continued PT services  PT Problem List Decreased balance;Decreased mobility;Decreased cognition;Decreased knowledge of precautions       PT Treatment Interventions Gait training;Stair training;Functional mobility training;Therapeutic activities;Therapeutic exercise;Balance training;Cognitive remediation;Patient/family education    PT Goals (Current goals can be found in the Care Plan section)  Acute Rehab PT Goals Patient Stated Goal: unable to state PT Goal Formulation: Patient unable to participate in goal setting Time For Goal Achievement: 11/13/22 Potential to Achieve Goals: Good    Frequency Min 3X/week     Co-evaluation PT/OT/SLP Co-Evaluation/Treatment: Yes Reason for Co-Treatment: Necessary to address cognition/behavior during functional activity (pt with agitation overnight/early morning) PT goals addressed during session: Mobility/safety with  mobility;Balance         AM-PAC PT "6 Clicks" Mobility  Outcome Measure Help needed turning from your back to your side while in a flat bed without using bedrails?: None Help needed moving from lying on your back to sitting on the side of a flat bed without using bedrails?: None Help needed moving to and from a bed to a chair (including a wheelchair)?: A Little Help needed standing up from a chair using your arms (e.g., wheelchair or bedside chair)?: A Little Help needed to walk in hospital room?: A Little Help needed climbing 3-5 steps with a railing? : A Little 6 Click Score: 20    End of Session Equipment Utilized During Treatment: Gait belt Activity Tolerance: Patient tolerated treatment well Patient left: in bed;with call bell/phone within reach;with bed alarm set;with family/visitor present;with SCD's reapplied Nurse Communication: Mobility status PT Visit Diagnosis: Other abnormalities of gait and mobility (R26.89)    Time: 0932-1000 PT Time Calculation (min) (ACUTE ONLY): 28 min   Charges:   PT Evaluation $PT Eval Low Complexity: [place]  Office [phone]   Rexanne Mano 10/30/2022, 10:19 AM

## 2022-10-30 NOTE — Progress Notes (Signed)
EEG complete - results pending 

## 2022-10-30 NOTE — Progress Notes (Signed)
[facility] PROGRESS NOTE   Barbara [name] VQQ:[id number] DOB: March 16, 1951 DOA: 10/29/2022  PCP: Kelton Pillar, MD  Brief History/Interval Summary:  71 y.o. female with medical history significant of dementia, hemorrhagic stroke, atrial fibrillation, OSA on CPAP, breast cancer, hypothyroidism, hyperlipidemia, anxiety presenting with acute encephalopathy/focal neurologic deficits.  She has aphasia at baseline but this was thought to be worse by her husband at the time of presentation.  Code stroke was initially called.  Patient was seen by neurology.  Hospitalized for further management.   Consultants: Neurology  Procedures: EEG and echocardiogram is pending    Subjective/Interval History: Patient with significant expressive aphasia so difficult to obtain history from her.  Husband and son at bedside.  Does not appear to be in any discomfort.  Does not appear to be short of breath.    Assessment/Plan:  Acute encephalopathy/possible TIA [place] seems to be back to baseline.  Does not have any focal neurological deficits.  MRI brain was negative for acute stroke.  CT angiogram did not show any large vessel occlusions. EEG still pending. Symptoms most likely secondary to COVID-19.  She was noted to be febrile in the emergency department. Echocardiogram is also pending.  PT and OT evaluation. LDL is 94.  Will recommend dose of atorvastatin be increased after she has completed course of Paxlovid. HbA1c is pending.  COVID-19 infection Does not appear to have any respiratory symptoms.  She is not hypoxic.  She has been started on Paxlovid.  Holding statin. CRP is 0.6.  Ferritin 87.  Previous history of hemorrhagic stroke No recurrence noted on imaging studies.  She has significant expressive aphasia as a result of her previous stroke.  Hyponatremia Improved after she was given IV fluids.  History of atrial fibrillation Currently in sinus rhythm.  Not noted to be on any  rate control medications or anticoagulation.  Will defer this to outpatient providers.  Hypothyroidism Continue levothyroxine.  Hyperlipidemia Holding atorvastatin for now as she is on Paxlovid.  Will recommend increasing the dose of atorvastatin at discharge due to elevated LDL of 94.  Anxiety Continue home medications.  History of obstructive sleep apnea Apparently does not use CPAP at home.  DVT Prophylaxis: SCDs Code Status: DNR Family Communication: Discussed with patient's husband and son Disposition Plan: Hopefully return home tomorrow  Status is: Observation The patient will require care spanning > 2 midnights and should be moved to inpatient because: Infection, concern for TIA, physical deconditioning      Medications: Scheduled:  DULoxetine  90 mg Oral q morning   levothyroxine  50 mcg Oral QAC breakfast   lisinopril  10 mg Oral QAC breakfast   nirmatrelvir/ritonavir EUA (renal dosing)  2 tablet Oral BID   QUEtiapine  12.5 mg Oral Daily   QUEtiapine  25 mg Oral QHS   Continuous:  sodium chloride 75 mL/hr at 10/29/22 1626   EPP:IRJJOACZYSAYT **OR** acetaminophen (TYLENOL) oral liquid 160 mg/5 mL **OR** acetaminophen, LORazepam, senna-docusate  Antibiotics: Anti-infectives (From admission, onward)    Start     Dose/Rate Route Frequency Ordered Stop   10/29/22 1545  nirmatrelvir/ritonavir EUA (renal dosing) (PAXLOVID) 2 tablet        2 tablet Oral 2 times daily 10/29/22 1531 11/03/22 0959   10/29/22 1215  cefTRIAXone (ROCEPHIN) 1 g in sodium chloride 0.9 % 100 mL IVPB        1 g 200 mL/hr over 30 Minutes Intravenous  Once 10/29/22 1209 10/29/22 1319  Objective:  Vital Signs  Vitals:   10/29/22 2053 10/29/22 2121 10/30/22 0229 10/30/22 0931  BP: 111/64 (!) 137/109 128/66 (!) 143/79  Pulse: 76 96 77 71  Resp: (!) '22 20 16 20  '$ Temp: 97.9 F (36.6 C) 98.8 F (37.1 C) 97.9 F (36.6 C) 98.2 F (36.8 C)  TempSrc: Temporal Oral Oral Axillary   SpO2: 99% 99% 100% 100%  Weight:      Height:        Intake/Output Summary (Last 24 hours) at 10/30/2022 1035 Last data filed at 10/30/2022 0300 Gross per 24 hour  Intake 1668.61 ml  Output --  Net 1668.61 ml   Filed Weights   10/29/22 1100  Weight: 68.2 kg    General appearance: Awake alert.  In no distress.  Anxious.  No distress Resp: Clear to auscultation bilaterally.  Normal effort Cardio: S1-S2 is normal regular.  No S3-S4.  No rubs murmurs or bruit GI: Abdomen is soft.  Nontender nondistended.  Bowel sounds are present normal.  No masses organomegaly Extremities: No edema.  Full range of motion of lower extremities. Neurologic:No focal neurological deficits.    Lab Results:  Data Reviewed: I have personally reviewed following labs and reports of the imaging studies  CBC: Recent Labs  Lab 10/29/22 1159 10/29/22 1201 10/30/22 0535  WBC  --  5.5 6.1  NEUTROABS  --  3.8 3.9  HGB 11.2* 12.0 13.4  HCT 33.0* 34.2* 39.2  MCV  --  92.7 92.7  PLT  --  178 295    Basic Metabolic Panel: Recent Labs  Lab 10/29/22 1159 10/29/22 1201 10/29/22 1842 10/30/22 0535  NA 130* 128* 134* 139  K 4.5 4.3 3.8 4.2  CL 102 101 103 111  CO2  --  18* 20* 21*  GLUCOSE 93 95 182* 101*  BUN '23 22 17 14  '$ CREATININE 1.00 1.09* 1.13* 1.04*  CALCIUM  --  8.4* 8.8* 8.9    GFR: Estimated Creatinine Clearance: 45.8 mL/min (A) (by C-G formula based on SCr of 1.04 mg/dL (H)).  Liver Function Tests: Recent Labs  Lab 10/29/22 1201 10/30/22 0535  AST 25 27  ALT 20 25  ALKPHOS 59 66  BILITOT 0.6 0.6  PROT 5.7* 6.6  ALBUMIN 3.2* 3.5    Coagulation Profile: Recent Labs  Lab 10/29/22 1201  INR 1.1    CBG: Recent Labs  Lab 10/29/22 1133  GLUCAP 85    Lipid Profile: Recent Labs    10/30/22 0535  CHOL 149  HDL 45  LDLCALC 94  TRIG 49  CHOLHDL 3.3    Anemia Panel: Recent Labs    10/30/22 0535  FERRITIN 87    Recent Results (from the past 240 hour(s))   Resp Panel by RT-PCR (Flu A&B, Covid) Anterior Nasal Swab     Status: Abnormal   Collection Time: 10/29/22 12:08 PM   Specimen: Anterior Nasal Swab  Result Value Ref Range Status   SARS Coronavirus 2 by RT PCR POSITIVE (A) NEGATIVE Final    Comment: (NOTE) SARS-CoV-2 target nucleic acids are DETECTED.  The SARS-CoV-2 RNA is generally detectable in upper respiratory specimens during the acute phase of infection. Positive results are indicative of the presence of the identified virus, but do not rule out bacterial infection or co-infection with other pathogens not detected by the test. Clinical correlation with patient history and other diagnostic information is necessary to determine patient infection status. The expected result is Negative.  Fact Sheet for Patients:  EntrepreneurPulse.com.au  Fact Sheet for Healthcare Providers: IncredibleEmployment.be  This test is not yet approved or cleared by the [place] FDA and  has been authorized for detection and/or diagnosis of SARS-CoV-2 by FDA under an Emergency Use Authorization (EUA).  This EUA will remain in effect (meaning this test can be used) for the duration of  the COVID-19 declaration under Section 564(b)(1) of the A ct, 21 U.S.C. section 360bbb-3(b)(1), unless the authorization is terminated or revoked sooner.     Influenza A by PCR NEGATIVE NEGATIVE Final   Influenza B by PCR NEGATIVE NEGATIVE Final    Comment: (NOTE) The Xpert Xpress SARS-CoV-2/FLU/RSV plus assay is intended as an aid in the diagnosis of influenza from Nasopharyngeal swab specimens and should not be used as a sole basis for treatment. Nasal washings and aspirates are unacceptable for Xpert Xpress SARS-CoV-2/FLU/RSV testing.  Fact Sheet for Patients: EntrepreneurPulse.com.au  Fact Sheet for Healthcare Providers: IncredibleEmployment.be  This test is not yet approved or  cleared by the [place] FDA and has been authorized for detection and/or diagnosis of SARS-CoV-2 by FDA under an Emergency Use Authorization (EUA). This EUA will remain in effect (meaning this test can be used) for the duration of the COVID-19 declaration under Section 564(b)(1) of the Act, 21 U.S.C. section 360bbb-3(b)(1), unless the authorization is terminated or revoked.  Performed at [facility], [place] [address] [place], [place] [address]   Blood Culture (routine x 2)     Status: None (Preliminary result)   Collection Time: 10/29/22 12:44 PM   Specimen: BLOOD RIGHT FOREARM  Result Value Ref Range Status   Specimen Description BLOOD RIGHT FOREARM  Final   Special Requests   Final    BOTTLES DRAWN AEROBIC AND ANAEROBIC Blood Culture adequate volume   Culture   Final    NO GROWTH < 24 HOURS Performed at [facility], [place] [address], [place], [place] [address]    Report Status PENDING  Incomplete  Blood Culture (routine x 2)     Status: None (Preliminary result)   Collection Time: 10/29/22 12:50 PM   Specimen: BLOOD RIGHT HAND  Result Value Ref Range Status   Specimen Description BLOOD RIGHT HAND  Final   Special Requests   Final    BOTTLES DRAWN AEROBIC AND ANAEROBIC Blood Culture adequate volume   Culture   Final    NO GROWTH < 24 HOURS Performed at [facility], [place] [address], [place],  [address]    Report Status PENDING  Incomplete      Radiology Studies: MR BRAIN WO CONTRAST  Result Date: 10/29/2022 CLINICAL DATA:  Neuro deficit, acute, stroke suspected EXAM: MRI HEAD WITHOUT CONTRAST TECHNIQUE: Multiplanar, multiecho pulse sequences of the brain and surrounding structures were obtained without intravenous contrast. COMPARISON:  CTA head/neck from the same day. FINDINGS: Brain: No acute infarction, acute hemorrhage, hydrocephalus, extra-axial collection or mass lesion. Multiple small foci of susceptibility artifact throughout bilateral  cerebral hemispheres, compatible with prior microhemorrhages. 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 Cerebral atrophy. Left occipital encephalomalacia. Vascular: Major arterial flow voids are maintained skull base. Skull and upper cervical spine: Normal marrow signal. Sinuses/Orbits: Mild paranasal sinus mucosal thickening. No acute bowel findings. Other: No mastoid effusions. IMPRESSION: 1. No evidence of acute intracranial abnormality. 2. 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 Electronically Signed   By: Margaretha Sheffield M.D.   On: 10/29/2022 15:35  CT ANGIO HEAD NECK W WO CM (CODE STROKE)  Result Date: 10/29/2022 CLINICAL DATA:  Neuro deficit, acute, stroke suspected EXAM: CT ANGIOGRAPHY HEAD AND NECK TECHNIQUE: Multidetector CT imaging of the head and neck was performed using the standard protocol during bolus administration of intravenous contrast. Multiplanar CT image reconstructions and MIPs were obtained to evaluate the vascular anatomy. Carotid stenosis measurements (when applicable) are obtained utilizing NASCET criteria, using the distal internal carotid diameter as the denominator. RADIATION DOSE REDUCTION: This exam was performed according to the departmental dose-optimization program which includes automated exposure control, adjustment of the mA and/or kV according to patient size and/or use of iterative reconstruction technique. CONTRAST:  160m OMNIPAQUE IOHEXOL 350 MG/ML SOLN COMPARISON:  None Available. FINDINGS: CT HEAD FINDINGS Brain: No evidence of acute infarction, hemorrhage, hydrocephalus, extra-axial collection or mass lesion/mass effect. Cerebral atrophy. Patchy white matter hypodensities, compatible with chronic microvascular ischemic disease. Vascular: Detailed below. Skull: No acute fracture. Sinuses/Orbits: No acute finding. Other: No mastoid effusions. Review of the MIP images  confirms the above findings CTA NECK FINDINGS Aortic arch: Great vessel origins are patent without significant stenosis. Right carotid system: No evidence of dissection, stenosis (50% or greater), or occlusion. Left carotid system: No evidence of dissection, stenosis (50% or greater), or occlusion. Vertebral arteries: Left dominant. No evidence of dissection, stenosis (50% or greater), or occlusion. Skeleton: Moderate multilevel degenerative change. Other neck: Heterogeneous and enlarged thyroid, further characterized on prior ultrasound from October 13, 2008. Upper chest: Visualized lung apices are clear. Review of the MIP images confirms the above findings CTA HEAD FINDINGS Anterior circulation: Bilateral intracranial ICAs, MCAs none, and ACAs are patent without proximal hemodynamically significant stenosis. Posterior circulation: Bilateral intradural vertebral arteries, basilar artery and bilateral posterior cerebral arteries are patent without proximal hemodynamically significant stenosis. Venous sinuses: As permitted by contrast timing, patent. Review of the MIP images confirms the above findings IMPRESSION: No emergent large vessel occlusion or proximal hemodynamically stenosis. Electronically Signed   By: FMargaretha SheffieldM.D.   On: 10/29/2022 12:38   DG Chest Port 1 View  Result Date: 10/29/2022 CLINICAL DATA:  Sepsis. EXAM: PORTABLE CHEST 1 VIEW COMPARISON:  12/31/2018 FINDINGS: The cardiac silhouette, mediastinal and hilar contours are normal. The lungs are clear. No infiltrates or effusions. No pulmonary lesions. The bony thorax is intact. IMPRESSION: No acute cardiopulmonary findings. Electronically Signed   By: PMarijo SanesM.D.   On: 10/29/2022 12:36   CT HEAD CODE STROKE WO CONTRAST  Result Date: 10/29/2022 CLINICAL DATA:  Code stroke.  No history provided. EXAM: CT HEAD WITHOUT CONTRAST TECHNIQUE: Contiguous axial images were obtained from the base of the skull through the vertex without  intravenous contrast. RADIATION DOSE REDUCTION: This exam was performed according to the departmental dose-optimization program which includes automated exposure control, adjustment of the mA and/or kV according to patient size and/or use of iterative reconstruction technique. COMPARISON:  MRI Brain 04/28/16. FINDINGS: Brain: No evidence of acute infarction, hemorrhage, hydrocephalus, extra-axial collection or mass lesion/mass effect. There is sequela of severe chronic microvascular ischemic change with advanced generalized volume loss that is slight left temporal lobe predominance. Vascular: No hyperdense vessel or unexpected calcification. Skull: Normal. Negative for fracture or focal lesion. Sinuses/Orbits: No acute finding. Polypoid mucosal thickening of the floor of the left maxillary sinus. Other: None ASPECTS ([place] Gardens[place]roke Program Early CT Score) 10 IMPRESSION: No hemorrhage or CT evidence of an acute infarct. Findings were paged to Dr. SLeonie Manon 10/29/22 at 11:39 AM via  AMION paging system Electronically Signed   By: Marin Roberts M.D.   On: 10/29/2022 11:40       LOS: 0 days   [facility] Pager on [email or web address]  10/30/2022, 10:35 AM

## 2022-10-30 NOTE — Procedures (Signed)
[facility]  Routine EEG Report   Demographics: Patient Name:   Barbara [name], Barbara [name]  Date of Birth:   March 05, 1951  Identification Number:   MRN - [id number]  Study Times:  Study Start Time:   10/30/2022 14:52:58 Study End Time:   10/30/2022 15:16:28  Duration:   23 minutes  Indication(s): Encephalopathy  Technical Summary:  This EEG was performed utilizing standard International 10-20 System of electrode placement. Data were obtained and interpreted utilizing referential montage recording, with reformatting to longitudinal, transverse bipolar, and referential montages as necessary for interpretation.  State(s):       Awake   Activation Procedures: Hyperventilation: Not performed Photic Stimulation: Not performed   EEG Description:  No normal patterns of awake or sleep were present during this recording. Intermittent generalized theta-delta slowing was present. Excess continuous generalized theta-delta slowing was present. Minor muscle, motion, electrode, and eye movement artifacts were occasionally noted.  Impression:  Abnormal EEG due to:    1) No normal patterns of awake or sleep  2) Intermittent generalized theta-delta slowing  3) Continuous generalized theta-delta slowing     Clinical Correlation:  This EEG is suggestive of a moderate encephalopathy, but is nonspecific as to etiology. The absence of epileptiform abnormalities does not preclude a clinical diagnosis of seizures.    [facility] For Inpatient follow-up with [facility] physician please call [facility] [phone]. This is not an outpatient service. Post hospital discharge, please contact hospital directly.

## 2022-10-30 NOTE — Progress Notes (Signed)
Neurology Progress Note  Brief HPI: Barbara [name] is a 71 y.o. female with a past medical history of dementia, possible CAA, and ICH in 2017 found at 49 by her husband altered and incontinent. She was in her usual state of health at 0800 per her husband.  At baseline she has mild dementia and aphasia and some speech difficulties but is able to recognize family and have a brief conversation with short sentences. On examination after she arrived she was found to have aphasia and no usable speech and not following commands but had no focal motor deficits involving face or extremities.   Subjective: Patient seen in room with husband at the bedside. He states she is pretty close to her mental status baseline. At the time of exam she had not gotten up and walked yet.   Exam: Vitals:   10/29/22 2121 10/30/22 0229  BP: (!) 137/109 128/66  Pulse: 96 77  Resp: 20 16  Temp: 98.8 F (37.1 C) 97.9 F (36.6 C)  SpO2: 99% 100%   Gen: In bed, NAD Resp: non-labored breathing, no acute distress Abd: soft, nt   NEURO:  Mental Status: awake, aphasic, but able to follow commands. She is able to make sounds and gesture to what she wants.  Cranial Nerves: PERRL 3 mm/brisk. EOMI, blinks to threat bilaterally, no facial asymmetry, hearing intact, tongue/uvula/soft palate midline, head is midline. No evidence of tongue atrophy or fasciculations Motor:  Moves extremities antigravity, bilateral upper extremity drift Tone: is normal and bulk is normal Sensation- intact in all extremities.  Coordination: Unable to complete, she is very shaky/shivering Gait- deferred    Pertinent Labs: COVID- Positive Na 128 -> 139  Imaging Reviewed: MRI Brain 1. No evidence of acute intracranial abnormality. 2. 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  CTA Head and Neck- No emergent large vessel occlusion or proximal  hemodynamically stenosis.  CT Head No hemorrhage or CT evidence of an acute infarct.   EEG pending  Assessment:  71 y.o. female with a past medical history of dementia, possible CAA, and ICH in 2017 found at 57 by her husband altered and incontinent. She was in her usual state of health at 0800 per her husband.  She was found to have aphasia and no usable speech and not following commands but had no focal motor deficits involving face or extremities.   Impression:  Encephalopathy/acute confusion in the setting of COVID infection   Recommendations: - continue delirium precautions - Neurology will remain available as need.  Patient seen and examined by NP/APP with MD. MD to update note as needed.   Janine Ores, DNP, FNP-BC [facility] Pager: [phone]

## 2022-10-30 NOTE — Evaluation (Signed)
Speech Language Pathology Evaluation Patient Details Name: Barbara [name] MRN: [id number] DOB: January 15, 1951 Today's Date: 10/30/2022 Time: 4765-4650 SLP Time Calculation (min) (ACUTE ONLY): 23 min  Problem List:  Patient Active Problem List   Diagnosis Date Noted   TIA (transient ischemic attack) 10/30/2022   Acute encephalopathy 10/29/2022   COVID-19 virus infection 10/29/2022   OSA on CPAP 03/24/2019   Genetic testing 03/06/2016   History of hemorrhagic cerebrovascular accident (CVA) without residual deficits 01/29/2016   SAH (subarachnoid hemorrhage) ([place]) 01/29/2016   Memory loss 01/29/2016   Paroxysmal atrial fibrillation ([place]) 01/29/2016   Cerebral amyloid angiopathy ([place]) 01/29/2016   ICH (intracerebral hemorrhage) ([place]) - R occipital 12/27/2015   Hyperlipidemia 12/27/2015   Cognitive deficits 12/27/2015   Hypothyroidism 12/27/2015   Anxiety 12/27/2015   Subarachnoid hemorrhage ([place]) - L frontal 12/25/2015   Cerebellar stroke ([place]) 12/02/2014   Osteopenia 12/01/2014   Rhinitis, chronic 01/13/2014   Malignant neoplasm of upper-outer quadrant of right breast in female, estrogen receptor positive ([place]) 09/26/2013   Past Medical History:  Past Medical History:  Diagnosis Date   Anxiety    Atrial fibrillation with RVR ([place]) 12/27/2015   Breast cancer (HCC)    GERD (gastroesophageal reflux disease)    Hashimoto's thyroiditis    with goiter   Headache(784.0)    hx migraines   Hypercholesteremia    Spina bifida ([place])    occutta L5   Spondylarthritis    Low grade L5 on S1 and natural arches of L5 being opend bilaterally.  Narrowing of the 4th and 5th lumbar interspaces.    Stroke ([place]) 11/25/2015   Varicose vein    Past Surgical History:  Past Surgical History:  Procedure Laterality Date   BREAST RECONSTRUCTION WITH PLACEMENT OF TISSUE EXPANDER AND FLEX HD (ACELLULAR HYDRATED DERMIS) Bilateral 10/13/2013   Procedure: PLACEMENT OF BILATERAL TISSUE EXPANDER FOR BREAST  RECONSTRUCTION ;  Surgeon: Crissie Reese, MD;  Location: [facility];  Service: Plastics;  Laterality: Bilateral;   CYSTOSCOPY WITH URETHRAL CARUNCLE     HERNIA REPAIR  1976   Left groin   MASTECTOMY W/ SENTINEL NODE BIOPSY Bilateral 10/13/2013   Procedure:  BILATERAL MASTECTOMY WITH RIGHT SENTINEL LYMPH NODE BIOPSY;  Surgeon: Merrie Roof, MD;  Location: [facility];  Service: General;  Laterality: Bilateral;   PORT-A-CATH REMOVAL Left 12/21/2014   Procedure: MINOR REMOVAL OF PORT-A-CATH;  Surgeon: Autumn Messing III, MD;  Location: [facility];  Service: General;  Laterality: Left;   PORTACATH PLACEMENT Left 10/13/2013   Procedure: INSERTION PORT-A-CATH;  Surgeon: Merrie Roof, MD;  Location: [facility];  Service: General;  Laterality: Left;   TUBAL LIGATION     WISDOM TOOTH EXTRACTION  1990   HPI:  [name] is a 71 y.o. female with medical history significant of dementia, hemorrhagic stroke, atrial fibrillation, OSA on CPAP, breast cancer, hypothyroidism, hyperlipidemia, anxiety presenting with acute encephalopathy/focal neurologic deficits.     Patient was last known normal around 8:30 AM this morning.  Husband went to check on her later and noticed that she was unable to speak and had been incontinent as well as having some balance issues.  She has had some intermittent incontinence at baseline as well as some baseline mild dementia with mild aphasia.  However at baseline, she is able to converse in short sentences.  Concern for stroke by family as her symptoms are similar to her 2017 hemorrhagic stroke.     Unable to obtain full  review of systems due to dementia/aphasia/altered mental status.  Family reports some congestion for the past 2 to 3 days but otherwise have not noted any preceding symptoms; MRI 12/6 negative.  SLE generated.   Assessment / Plan / Recommendation Clinical Impression  Pt assessed via limited evaluation of speech/language and cognition d/t agitation when SLP entered  room.  Pt actively attempting to get out of bed and crying/inconsolable initially, but relaxed and cooperated after verbal cues/reassurance given during session.  Pt able to follow simple 1-2 step commands with visual cues and repetition with 80% accuracy; pt ability decreased with 3+ step commands (0% accuracy).  OME unremarkable via observation and oral tasks pt was able to complete readily.  Pt's speech appeared intelligible without vocal quality changes noted.  Pt has baseline memory recall/storage deficits and expressive aphasia per husband's report.  He feels she is back to her baseline level of functioning, but is eager to return home. Pt answered yes/no questions with 100% accuracy for simple questions only.  She attempted to answer simple biological questions, but was intermittently successful (50%) with this task.  She did improve with sentence completion cues and leading questions, but overall continues to exhibit expressive aphasia/cognitive/linguistic deficits.  ST will s/o in acute setting d/t pt returning to baseline level of functioning.  Thank you for this consult.    SLP Assessment  SLP Recommendation/Assessment: Patient does not need any further Speech Language Pathology Services SLP Visit Diagnosis: Aphasia (R47.01);Cognitive communication deficit (R41.841)    Recommendations for follow up therapy are one component of a multi-disciplinary discharge planning process, led by the attending physician.  Recommendations may be updated based on patient status, additional functional criteria and insurance authorization.    Follow Up Recommendations  No SLP follow up    Assistance Recommended at Discharge  Frequent or constant Supervision/Assistance  Functional Status Assessment Patient has had a recent decline in their functional status and demonstrates the ability to make significant improvements in function in a reasonable and predictable amount of time.  Frequency and Duration   (evaluation only)         SLP Evaluation Cognition  Overall Cognitive Status: History of cognitive impairments - at baseline Arousal/Alertness: Awake/alert Orientation Level: Oriented to person Attention:  (DTA d/t agitation) Memory: Impaired (at baseline) Memory Impairment: Retrieval deficit;Decreased recall of new information;Storage deficit;Other (comment) (per husband's report) Awareness: Impaired Problem Solving: Impaired Problem Solving Impairment: Verbal basic;Functional basic Behaviors: Restless;Impulsive;Agitated Behavior Scale Safety/Judgment: Impaired Comments: Pt pulling at IV and attempting to get out of bed when SLP entered room       Comprehension  Auditory Comprehension Overall Auditory Comprehension: Impaired at baseline Commands: Impaired Two Step Basic Commands: 25-49% accurate Conversation: Other (comment) (DTA d/t baseline function) Interfering Components: Attention;Anxiety;Working Marine scientist;Other (comment) (baseline function (dementia)) EffectiveTechniques: Repetition;Visual/Gestural cues Visual Recognition/Discrimination Discrimination: Not tested Reading Comprehension Reading Status: Not tested    Expression Expression Primary Mode of Expression: Verbal Verbal Expression Overall Verbal Expression: Impaired at baseline Level of Generative/Spontaneous Verbalization: Sentence Repetition: Impaired Level of Impairment: Word level Naming: Impairment Other Naming Comments: aphasia at baseline; cog deficits Pragmatics: Unable to assess Interfering Components: Attention;Premorbid deficit Non-Verbal Means of Communication: Not applicable Written Expression Dominant Hand: Right Written Expression: Unable to assess (comment) (agitation)   Oral / Motor  Oral Motor/Sensory Function Overall Oral Motor/Sensory Function: Within functional limits Motor Speech Overall Motor Speech: Other (comment) (DTA) Respiration: Within functional limits Phonation:  Normal Resonance: Within functional limits Articulation: Within functional limitis Intelligibility: Intelligible  Motor Planning:  (DTA) Motor Speech Errors: Not applicable Interfering Components: Premorbid status            Elvina Sidle, M.S., CCC-SLP 10/30/2022, 1:37 PM

## 2022-10-30 NOTE — Progress Notes (Signed)
  Echocardiogram 2D Echocardiogram has been performed.  Barbara [name] 10/30/2022, 10:59 AM

## 2022-10-30 NOTE — Evaluation (Signed)
Occupational Therapy Evaluation Patient Details Name: Barbara [name] MRN: [id number] DOB: 1951-06-05 Today's Date: 10/30/2022   History of Present Illness Pt is a 70 y.o. female presenting on 12/6 after being found by husband to be altered and incontinent. + COVID; CT head and CTA head/neck negative. MRI brain negative. PMH significant for dementia, aphasia, hemorrhagic stroke, atrial fibrillation, OSA on CPAP, breast cancer, hypothyroidism, hyperlipidemia, and anxiety.   Clinical Impression   PTA, pt lived with her husband who provided supervision assistance for ADL. Upon eval, pt performing at min guard A level and husband reporting pt is nearing baseline function. Pt following simple commands and agreeable to movement. Pt with decreased problem solving to follow less familiar commands. Recommending discharge home with continued supervision from husband. Will follow acutely to optimize safety and independence in ADL and IADL.      Recommendations for follow up therapy are one component of a multi-disciplinary discharge planning process, led by the attending physician.  Recommendations may be updated based on patient status, additional functional criteria and insurance authorization.   Follow Up Recommendations  No OT follow up     Assistance Recommended at Discharge Frequent or constant Supervision/Assistance  Patient can return home with the following A little help with walking and/or transfers;A little help with bathing/dressing/bathroom;Assistance with cooking/housework;Assistance with feeding;Direct supervision/assist for medications management;Assist for transportation;Help with stairs or ramp for entrance;Direct supervision/assist for financial management    Functional Status Assessment  Patient has had a recent decline in their functional status and demonstrates the ability to make significant improvements in function in a reasonable and predictable amount of time.  Equipment  Recommendations  None recommended by OT    Recommendations for Other Services       Precautions / Restrictions Precautions Precautions: Fall Restrictions Weight Bearing Restrictions: No      Mobility Bed Mobility Overal bed mobility: Independent             General bed mobility comments: from supine, HOB 0 and no rail; incr time to understand what was wanted of her    Transfers Overall transfer level: Needs assistance Equipment used: None Transfers: Sit to/from Stand Sit to Stand: Min guard           General transfer comment: for safety; no imbalance noted      Balance Overall balance assessment: Mild deficits observed, not formally tested (wide BOS for gait; unable to follow instructions for [place])                                         ADL either performed or assessed with clinical judgement   ADL Overall ADL's : Needs assistance/impaired Eating/Feeding: Minimal assistance   Grooming: Oral care;Min guard;Cueing for sequencing;Standing Grooming Details (indicate cue type and reason): Cueing to initiate task; performing with min gaurd A after intiation Upper Body Bathing: Set up;Sitting;Cueing for sequencing   Lower Body Bathing: Set up;Cueing for sequencing;Sitting/lateral leans   Upper Body Dressing : Set up;Sitting   Lower Body Dressing: Set up;Sitting/lateral leans   Toilet Transfer: Min guard;Ambulation   Toileting- Clothing Manipulation and Hygiene: Min guard;Sit to/from stand       Functional mobility during ADLs: Min guard General ADL Comments: Pt requiring cues for initating performance of tasks but able to complete without assistance     Vision Baseline Vision/History: 1 Wears glasses Ability to See in Adequate Light: 0  Adequate Patient Visual Report: No change from baseline Additional Comments: Pt unable to report any changes. Leaving glasses at sink and able to recognize she forgot them and scan to locate them.      Perception     Praxis      Pertinent Vitals/Pain Pain Assessment Pain Assessment: Faces Faces Pain Scale: No hurt     Hand Dominance Right   Extremity/Trunk Assessment Upper Extremity Assessment Upper Extremity Assessment: Overall WFL for tasks assessed   Lower Extremity Assessment Lower Extremity Assessment: Defer to PT evaluation   Cervical / Trunk Assessment Cervical / Trunk Assessment: Normal   Communication Communication Communication: Expressive difficulties (husband reports had prior to yesterday)   Cognition Arousal/Alertness: Awake/alert Behavior During Therapy: WFL for tasks assessed/performed Overall Cognitive Status: History of cognitive impairments - at baseline                                 General Comments: unable to state her husband's name (but he reports that is typical); oriented to self and following simple commands with visual and tactile cues. Pt leaving glasses at sink and recalling she forgot them upon return to bed. WIth cues able to return to sink to look for glasses. Locating without A     General Comments  Husband present    Exercises     Shoulder Instructions      Home Living Family/patient expects to be discharged to:: Private residence Living Arrangements: Spouse/significant other Available Help at Discharge: Family;Available 24 hours/day Type of Home: House Home Access: Stairs to enter [employer] of Steps: 2 Entrance Stairs-Rails: None Home Layout: Two level;Bed/bath upstairs Alternate Level Stairs-Number of Steps: 14 Alternate Level Stairs-Rails: Right;Left Bathroom Shower/Tub: Tub/shower unit         Home Equipment: None          Prior Functioning/Environment Prior Level of Function : Needs assist  Cognitive Assist : ADLs (cognitive)           Mobility Comments: independent with transfers and gait ADLs Comments: sits down into tub; husband supervises bathing        OT Problem  List: Decreased strength;Decreased activity tolerance;Impaired balance (sitting and/or standing);Decreased cognition;Decreased safety awareness      OT Treatment/Interventions: Self-care/ADL training;Therapeutic exercise;Balance training;Patient/family education;Cognitive remediation/compensation;DME and/or AE instruction    OT Goals(Current goals can be found in the care plan section) Acute Rehab OT Goals Patient Stated Goal: none OT Goal Formulation: Patient unable to participate in goal setting Time For Goal Achievement: 11/13/22 Potential to Achieve Goals: Good  OT Frequency: Min 2X/week    Co-evaluation PT/OT/SLP Co-Evaluation/Treatment: Yes Reason for Co-Treatment: Necessary to address cognition/behavior during functional activity (pt with agitation late night/early morning) PT goals addressed during session: Mobility/safety with mobility OT goals addressed during session: ADL's and self-care      AM-PAC OT "6 Clicks" Daily Activity     Outcome Measure Help from another person eating meals?: A Little Help from another person taking care of personal grooming?: A Little Help from another person toileting, which includes using toliet, bedpan, or urinal?: A Little Help from another person bathing (including washing, rinsing, drying)?: A Little Help from another person to put on and taking off regular upper body clothing?: A Little Help from another person to put on and taking off regular lower body clothing?: A Little 6 Click Score: 18   End of Session Equipment Utilized During Treatment: Gait belt  Nurse Communication: Mobility status  Activity Tolerance: Patient tolerated treatment well Patient left: in bed;with call bell/phone within reach;with bed alarm set;with family/visitor present  OT Visit Diagnosis: Unsteadiness on feet (R26.81);Muscle weakness (generalized) (M62.81);Other symptoms and signs involving cognitive function                Time: 7824-2353 OT Time  Calculation (min): 31 min Charges:  OT General Charges $OT Visit: 1 Visit OT Evaluation $OT Eval Moderate Complexity: 1 Mod  Elder Cyphers, OTR/L [facility] Acute Rehabilitation Office: [phone]   Magnus Ivan 10/30/2022, 10:53 AM

## 2022-10-30 NOTE — Care Management Obs Status (Signed)
[place] NOTIFICATION   Patient Details  Name: Barbara [name] MRN: [id number] Date of Birth: 09-Dec-1950   Medicare Observation Status Notification Given:  Yes    Ninfa Meeker, RN 10/30/2022, 4:38 PM

## 2022-10-31 ENCOUNTER — Other Ambulatory Visit (HOSPITAL_COMMUNITY): Payer: Self-pay

## 2022-10-31 NOTE — Progress Notes (Signed)
COMMUNITY PALLIATIVE CARE SW NOTE  PATIENT NAME: Barbara [name] DOB: 1951-07-12 MRN: [id number]  PRIMARY CARE PROVIDER: Kelton Pillar, MD  RESPONSIBLE PARTY:  Acct ID - Guarantor Home Phone Work Phone Relationship Acct Type  [id number] - [place],[phone]  Self P/F     [address], [place], [place] [address]    Social Work Encounter   PC SW completed a telephonic encounter with patient's DIL and her husband-Edward. Patient is progressing in her demential where she is wondering at night. She continues to eat well, she has not had any weight loss.The patient is incontinent. Patient has caregivers, however her husband is reluctant to increase in-home assistance. SW provided her husband with additional resources for in-home care agencies and [employer]. SW informed DIL and husband that patient did not meet criteria for palliative care. SW encouraged them to call back if/when patient's condition decline more for support.      [address] [place], [place]

## 2022-11-03 LAB — CULTURE, BLOOD (ROUTINE X 2)
Culture: NO GROWTH
Culture: NO GROWTH
Special Requests: ADEQUATE
Special Requests: ADEQUATE

## 2022-11-06 DIAGNOSIS — U071 COVID-19: Secondary | ICD-10-CM | POA: Diagnosis not present

## 2022-11-06 DIAGNOSIS — I6930 Unspecified sequelae of cerebral infarction: Secondary | ICD-10-CM | POA: Diagnosis not present

## 2022-11-06 DIAGNOSIS — G9349 Other encephalopathy: Secondary | ICD-10-CM | POA: Diagnosis not present

## 2022-11-06 DIAGNOSIS — R059 Cough, unspecified: Secondary | ICD-10-CM | POA: Diagnosis not present

## 2022-11-06 DIAGNOSIS — R413 Other amnesia: Secondary | ICD-10-CM | POA: Diagnosis not present

## 2022-11-06 DIAGNOSIS — I480 Paroxysmal atrial fibrillation: Secondary | ICD-10-CM | POA: Diagnosis not present

## 2022-11-06 DIAGNOSIS — R0602 Shortness of breath: Secondary | ICD-10-CM | POA: Diagnosis not present

## 2022-11-06 DIAGNOSIS — I10 Essential (primary) hypertension: Secondary | ICD-10-CM | POA: Diagnosis not present

## 2022-11-25 ENCOUNTER — Ambulatory Visit (INDEPENDENT_AMBULATORY_CARE_PROVIDER_SITE_OTHER): Payer: PPO | Admitting: Diagnostic Neuroimaging

## 2022-11-25 ENCOUNTER — Encounter: Payer: Self-pay | Admitting: Diagnostic Neuroimaging

## 2022-11-25 VITALS — BP 112/68 | HR 67 | Ht 66.0 in | Wt 149.4 lb

## 2022-11-25 DIAGNOSIS — F03B11 Unspecified dementia, moderate, with agitation: Secondary | ICD-10-CM

## 2022-11-25 MED ORDER — QUETIAPINE FUMARATE 25 MG PO TABS: 25.0000 mg | ORAL_TABLET | Freq: Two times a day (BID) | ORAL | 12 refills | Status: DC

## 2022-11-25 MED ORDER — SERTRALINE HCL 25 MG PO TABS: 25.0000 mg | ORAL_TABLET | Freq: Every day | ORAL | 12 refills | Status: AC

## 2022-11-25 NOTE — Patient Instructions (Signed)
-   stop cymbalta - start sertraline '25mg'$  daily - increase quetiapine (12.'5mg'$  at 9am, 12.'5mg'$  at noon, '25mg'$  at 8pm)
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
## 2022-12-01 ENCOUNTER — Telehealth: Payer: Self-pay | Admitting: Diagnostic Neuroimaging

## 2022-12-01 ENCOUNTER — Other Ambulatory Visit: Payer: Self-pay | Admitting: Neurology

## 2022-12-01 MED ORDER — QUETIAPINE FUMARATE 25 MG PO TABS: 25.0000 mg | ORAL_TABLET | Freq: Two times a day (BID) | ORAL | 12 refills | Status: DC

## 2022-12-01 MED ORDER — QUETIAPINE FUMARATE 25 MG PO TABS: 12.5000 mg | ORAL_TABLET | Freq: Three times a day (TID) | ORAL | 12 refills | Status: AC

## 2022-12-01 NOTE — Telephone Encounter (Signed)
Called the patient's husband back.   Dr.Penumalli at the last visit increased the medication directions to 12.'5mg'$  at 9am, 12.'5mg'$  at noon, '25mg'$  at 8pm). Currently it is not written on directions to reflect that. I will correct and send to the pharmacy

## 2022-12-01 NOTE — Addendum Note (Signed)
Addended by: Darleen Crocker on: 12/01/2022 02:50 PM   Modules accepted: Orders

## 2022-12-01 NOTE — Telephone Encounter (Signed)
Pt husband is calling. Stated he needs a new prescription sent to pharmacy for medication QUEtiapine (SEROQUEL) 25 MG tablet that doesn't say Take 12.5 mg by mouth in the am. He is requesting a call back from nurse.

## 2022-12-02 ENCOUNTER — Other Ambulatory Visit: Payer: Self-pay | Admitting: Neurology

## 2022-12-02 ENCOUNTER — Telehealth: Payer: Self-pay | Admitting: Neurology

## 2022-12-02 NOTE — Telephone Encounter (Signed)
Received the consent form indicating records can be sent to [facility] Supportive care, Santiago Glad, NP. I have printed most recent OV note and faxed to them including a message that they can take over management of prescriptions. Received confirmation that it went htrough

## 2022-12-02 NOTE — Telephone Encounter (Signed)
Received a call from Palliative care support team, [facility]. She is calling to ask if Dr Leta Baptist would like for Santiago Glad with palliative to take over the medication management of the seroquel and sertaline. She would like for [place] to fax over last office note. [facility] will fax over their ov note and consent note advising ok to send our records.  [facility] phone number is [phone] Fax [place] is [phone].

## 2022-12-12 ENCOUNTER — Encounter: Payer: Self-pay | Admitting: Diagnostic Neuroimaging

## 2023-06-01 ENCOUNTER — Telehealth: Payer: PPO | Admitting: Diagnostic Neuroimaging

## 2023-06-02 ENCOUNTER — Telehealth: Payer: PPO | Admitting: Diagnostic Neuroimaging

## 2023-09-09 ENCOUNTER — Telehealth: Payer: Self-pay | Admitting: Diagnostic Neuroimaging

## 2023-09-09 NOTE — Telephone Encounter (Signed)
Pa was denied. Per a previous conversation this medication was being taken over by palliative and hospice. He can check with them to see if they are able to get this medication for her. If they are not and he still gets from the pharmacy. Pt can [facility] it for retail pharmacy pricing and pay for it

## 2023-09-09 NOTE — Telephone Encounter (Signed)
PA completed through [facility] KEY: [id number] Will await determination

## 2023-09-09 NOTE — Telephone Encounter (Signed)
Pt's husband called stating that he is needing to speak to the RN regarding the pt's QUEtiapine (SEROQUEL) 25 MG tablet. He states that the insurance is now refusing to cover it stating it is not the correct medication for her Dx. He would like for MD to review this situation to see if it can be appealed or if the medication can be changed to something that can be covered. Pt is almost out and is needing this taken care of as soon as it can be due to it being able to control pt as it is. Please advise.

## 2023-09-09 NOTE — Telephone Encounter (Signed)
Called the husband back. Pt is taking seroquel 50 mg In the am, 25 mg at lunch and 25 mg at bedtime. The insurance is denying because it states they will not cover it for dementia agitation. Informed the pt that [facility] would cover this medication for 16$ for a month supply. The husband would prefer to see if there is alternative medication that the patient can try that may be covered under the medicare insurance. He would like me to ask Dr Marjory Lies.

## 2023-09-09 NOTE — Telephone Encounter (Signed)
Will initiate a PA for the patient for the seroquel. I do not see where this has been completed before and should be an easy fix.

## 2023-09-10 NOTE — Telephone Encounter (Signed)
Spoke with Dr Marjory Lies and seroquel is denied because FDA does not have her diagnosis listed as a correct FDA use for this medication. The other medications he suggested were Risperdal and olanzapine which I looked up and the same concern in regards to FDA use is her diagnosis is not an approved diagnosis.   He feels that it would be best to have the patient continue the medication we know that is effectively working for her if possible. We can attempt a PA appeal for the seroquel

## 2023-10-19 ENCOUNTER — Telehealth: Payer: Self-pay

## 2023-10-19 NOTE — Telephone Encounter (Signed)
Carrie from [employer] called to inform us that they patient insurance will no longer cover her Rx Seroquel. Patient is sees Criss Alvine, NP whom would like to prescribe her Rexulti and Lorazepam for substitute to Seroquel. Patient husband wanted verification that Dr Marjory Lies office would agree with her decision. Dr. Marjory Lies and Nicholas Lose, NP are out of the office this week and will return on Monday. In the meant time I have discuss the issues with Ethelene Browns, NP  and she approves for the patient to be able to take the Rexulti and Lorazepam.

## 2023-10-27 NOTE — Telephone Encounter (Signed)
Called Carrie from [place] support and she informed me that they would keep her Seroquel due to the cost of the other Rx.

## 2024-04-19 ENCOUNTER — Ambulatory Visit
Admission: RE | Admit: 2024-04-19 | Discharge: 2024-04-19 | Disposition: A | Discharge status: Home / Self Care | Source: Ambulatory Visit | Attending: Internal Medicine | Admitting: Internal Medicine

## 2024-04-19 ENCOUNTER — Other Ambulatory Visit: Payer: Self-pay | Admitting: Internal Medicine

## 2024-04-19 DIAGNOSIS — M7989 Other specified soft tissue disorders: Secondary | ICD-10-CM

## 2024-06-22 ENCOUNTER — Other Ambulatory Visit (HOSPITAL_BASED_OUTPATIENT_CLINIC_OR_DEPARTMENT_OTHER): Payer: Self-pay

## 2024-08-01 ENCOUNTER — Ambulatory Visit (INDEPENDENT_AMBULATORY_CARE_PROVIDER_SITE_OTHER): Admitting: Diagnostic Neuroimaging

## 2024-08-01 ENCOUNTER — Encounter: Payer: Self-pay | Admitting: Diagnostic Neuroimaging

## 2024-08-01 VITALS — BP 106/62 | HR 80 | Ht 66.0 in | Wt 163.0 lb

## 2024-08-01 DIAGNOSIS — F03C11 Unspecified dementia, severe, with agitation: Secondary | ICD-10-CM | POA: Diagnosis not present

## 2024-08-01 NOTE — Patient Instructions (Signed)
 SEVERE DEMENTIA (significant aphasia; possible primary progressive aphasia vs dementia with lewy bodies) - continue palliative care; likely will need memory care support - continue sertraline  37.5mg  daily and quetiapine  (25mg  at 9am, 12.5mg  at noon, 12.5mg  at 8pm) - consider nuedexta in future - safety / supervision issues reviewed - no driving; cannot handle finances or medications
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
## 2024-10-06 ENCOUNTER — Ambulatory Visit
Admission: RE | Admit: 2024-10-06 | Discharge: 2024-10-06 | Disposition: A | Source: Ambulatory Visit | Attending: Internal Medicine

## 2024-10-06 ENCOUNTER — Other Ambulatory Visit: Payer: Self-pay | Admitting: Internal Medicine

## 2024-10-06 DIAGNOSIS — M25551 Pain in right hip: Secondary | ICD-10-CM

## 2024-10-06 DIAGNOSIS — Z9181 History of falling: Secondary | ICD-10-CM

## 2024-10-07 ENCOUNTER — Other Ambulatory Visit: Payer: Self-pay | Admitting: Internal Medicine

## 2024-10-07 DIAGNOSIS — Z9181 History of falling: Secondary | ICD-10-CM

## 2024-10-10 ENCOUNTER — Ambulatory Visit
Admission: RE | Admit: 2024-10-10 | Discharge: 2024-10-10 | Disposition: A | Discharge status: Home / Self Care | Source: Ambulatory Visit | Attending: Internal Medicine | Admitting: Internal Medicine

## 2024-10-10 ENCOUNTER — Other Ambulatory Visit: Payer: Self-pay | Admitting: Internal Medicine

## 2024-10-10 DIAGNOSIS — Z9181 History of falling: Secondary | ICD-10-CM

## 2024-10-17 ENCOUNTER — Telehealth: Payer: Self-pay | Admitting: Diagnostic Neuroimaging

## 2024-10-17 NOTE — Telephone Encounter (Signed)
 Patient passed away 10/15/2024

## 2024-10-17 NOTE — Telephone Encounter (Signed)
 SYMPATHY CARD PLACED IN POD 3 FOR HALL B SIGNATURES FROM STAFF
